# Patient Record
Sex: Female | Born: 1945
Health system: Southern US, Community
[De-identification: ages and names within clinical notes are randomized; demographics above are authoritative.]

## PROBLEM LIST (undated history)

## (undated) DIAGNOSIS — C4491 Basal cell carcinoma of skin, unspecified: Secondary | ICD-10-CM

## (undated) DIAGNOSIS — Z7902 Long term (current) use of antithrombotics/antiplatelets: Secondary | ICD-10-CM

## (undated) DIAGNOSIS — I779 Disorder of arteries and arterioles, unspecified: Secondary | ICD-10-CM

## (undated) DIAGNOSIS — K222 Esophageal obstruction: Secondary | ICD-10-CM

## (undated) DIAGNOSIS — F32A Depression, unspecified: Secondary | ICD-10-CM

## (undated) DIAGNOSIS — G4730 Sleep apnea, unspecified: Secondary | ICD-10-CM

## (undated) DIAGNOSIS — Z794 Long term (current) use of insulin: Secondary | ICD-10-CM

## (undated) DIAGNOSIS — E119 Type 2 diabetes mellitus without complications: Secondary | ICD-10-CM

## (undated) DIAGNOSIS — G4733 Obstructive sleep apnea (adult) (pediatric): Secondary | ICD-10-CM

## (undated) DIAGNOSIS — I639 Cerebral infarction, unspecified: Secondary | ICD-10-CM

## (undated) DIAGNOSIS — N183 Chronic kidney disease, stage 3 unspecified: Secondary | ICD-10-CM

## (undated) DIAGNOSIS — I10 Essential (primary) hypertension: Secondary | ICD-10-CM

## (undated) DIAGNOSIS — L439 Lichen planus, unspecified: Secondary | ICD-10-CM

## (undated) DIAGNOSIS — E039 Hypothyroidism, unspecified: Secondary | ICD-10-CM

## (undated) DIAGNOSIS — K219 Gastro-esophageal reflux disease without esophagitis: Secondary | ICD-10-CM

## (undated) DIAGNOSIS — H5316 Psychophysical visual disturbances: Secondary | ICD-10-CM

## (undated) DIAGNOSIS — B1910 Unspecified viral hepatitis B without hepatic coma: Secondary | ICD-10-CM

## (undated) DIAGNOSIS — L509 Urticaria, unspecified: Secondary | ICD-10-CM

## (undated) DIAGNOSIS — I447 Left bundle-branch block, unspecified: Secondary | ICD-10-CM

## (undated) DIAGNOSIS — N189 Chronic kidney disease, unspecified: Secondary | ICD-10-CM

## (undated) DIAGNOSIS — N2581 Secondary hyperparathyroidism of renal origin: Secondary | ICD-10-CM

## (undated) DIAGNOSIS — M1990 Unspecified osteoarthritis, unspecified site: Secondary | ICD-10-CM

## (undated) DIAGNOSIS — E785 Hyperlipidemia, unspecified: Secondary | ICD-10-CM

## (undated) DIAGNOSIS — C801 Malignant (primary) neoplasm, unspecified: Secondary | ICD-10-CM

## (undated) DIAGNOSIS — G2581 Restless legs syndrome: Secondary | ICD-10-CM

## (undated) DIAGNOSIS — K224 Dyskinesia of esophagus: Secondary | ICD-10-CM

## (undated) DIAGNOSIS — E079 Disorder of thyroid, unspecified: Secondary | ICD-10-CM

## (undated) DIAGNOSIS — I2510 Atherosclerotic heart disease of native coronary artery without angina pectoris: Secondary | ICD-10-CM

## (undated) DIAGNOSIS — N3946 Mixed incontinence: Secondary | ICD-10-CM

## (undated) DIAGNOSIS — I700 Atherosclerosis of aorta: Secondary | ICD-10-CM

## (undated) DIAGNOSIS — D649 Anemia, unspecified: Secondary | ICD-10-CM

## (undated) DIAGNOSIS — F329 Major depressive disorder, single episode, unspecified: Secondary | ICD-10-CM

## (undated) HISTORY — PX: TUBAL LIGATION: SHX77

## (undated) HISTORY — DX: Urticaria, unspecified: L50.9

## (undated) HISTORY — DX: Lichen planus, unspecified: L43.9

## (undated) HISTORY — DX: Atherosclerotic heart disease of native coronary artery without angina pectoris: I25.10

## (undated) HISTORY — DX: Psychophysical visual disturbances: H53.16

## (undated) HISTORY — DX: Type 2 diabetes mellitus without complications: E11.9

## (undated) HISTORY — PX: CATARACT EXTRACTION: SUR2

## (undated) HISTORY — DX: Sleep apnea, unspecified: G47.30

## (undated) HISTORY — PX: ESOPHAGOGASTRODUODENOSCOPY: SHX1529

## (undated) HISTORY — DX: Hypothyroidism, unspecified: E03.9

## (undated) HISTORY — DX: Mixed incontinence: N39.46

## (undated) HISTORY — DX: Unspecified viral hepatitis B without hepatic coma: B19.10

## (undated) HISTORY — DX: Essential (primary) hypertension: I10

## (undated) HISTORY — DX: Disorder of thyroid, unspecified: E07.9

## (undated) HISTORY — PX: TONSILLECTOMY: SUR1361

## (undated) HISTORY — PX: HEMORRHOID SURGERY: SHX153

## (undated) HISTORY — DX: Gastro-esophageal reflux disease without esophagitis: K21.9

## (undated) HISTORY — DX: Obstructive sleep apnea (adult) (pediatric): G47.33

## (undated) HISTORY — PX: IMPLANTABLE CONTACT LENS IMPLANTATION: SHX1792

---

## 2006-02-28 ENCOUNTER — Ambulatory Visit: Payer: Self-pay | Admitting: Internal Medicine

## 2006-04-03 ENCOUNTER — Ambulatory Visit: Payer: Self-pay | Admitting: Pain Medicine

## 2006-04-12 ENCOUNTER — Ambulatory Visit: Payer: Self-pay | Admitting: Pain Medicine

## 2006-04-13 ENCOUNTER — Ambulatory Visit: Payer: Self-pay | Admitting: Pain Medicine

## 2006-05-22 ENCOUNTER — Ambulatory Visit: Payer: Self-pay | Admitting: Pain Medicine

## 2006-12-19 ENCOUNTER — Ambulatory Visit: Payer: Self-pay | Admitting: Internal Medicine

## 2007-01-18 HISTORY — PX: COLONOSCOPY: SHX174

## 2007-02-22 ENCOUNTER — Ambulatory Visit: Payer: Self-pay | Admitting: Gastroenterology

## 2007-04-03 ENCOUNTER — Ambulatory Visit: Payer: Self-pay | Admitting: Internal Medicine

## 2007-04-16 ENCOUNTER — Ambulatory Visit: Payer: Self-pay | Admitting: Unknown Physician Specialty

## 2008-07-23 ENCOUNTER — Ambulatory Visit: Payer: Self-pay | Admitting: Cardiovascular Disease

## 2008-07-23 ENCOUNTER — Ambulatory Visit: Payer: Self-pay | Admitting: Ophthalmology

## 2008-07-31 ENCOUNTER — Ambulatory Visit: Payer: Self-pay | Admitting: Internal Medicine

## 2008-08-05 ENCOUNTER — Ambulatory Visit: Payer: Self-pay | Admitting: Ophthalmology

## 2008-11-11 ENCOUNTER — Ambulatory Visit: Payer: Self-pay | Admitting: Ophthalmology

## 2008-11-25 ENCOUNTER — Ambulatory Visit: Payer: Self-pay | Admitting: Ophthalmology

## 2009-08-05 ENCOUNTER — Ambulatory Visit: Payer: Self-pay | Admitting: Internal Medicine

## 2009-09-24 ENCOUNTER — Ambulatory Visit: Payer: Self-pay | Admitting: Gastroenterology

## 2009-10-22 ENCOUNTER — Ambulatory Visit: Payer: Self-pay | Admitting: Gastroenterology

## 2010-10-06 ENCOUNTER — Ambulatory Visit: Payer: Self-pay | Admitting: Internal Medicine

## 2011-05-13 ENCOUNTER — Ambulatory Visit: Payer: Self-pay | Admitting: Internal Medicine

## 2011-05-14 ENCOUNTER — Ambulatory Visit: Payer: Self-pay | Admitting: Internal Medicine

## 2011-05-14 LAB — COMPREHENSIVE METABOLIC PANEL
Albumin: 3.2 g/dL — ABNORMAL LOW (ref 3.4–5.0)
Anion Gap: 9 (ref 7–16)
Bilirubin,Total: 0.3 mg/dL (ref 0.2–1.0)
Calcium, Total: 9.2 mg/dL (ref 8.5–10.1)
Creatinine: 1.4 mg/dL — ABNORMAL HIGH (ref 0.60–1.30)
EGFR (Non-African Amer.): 39 — ABNORMAL LOW
Glucose: 295 mg/dL — ABNORMAL HIGH (ref 65–99)
Osmolality: 286 (ref 275–301)
Potassium: 3.8 mmol/L (ref 3.5–5.1)
SGOT(AST): 20 U/L (ref 15–37)
SGPT (ALT): 20 U/L
Sodium: 137 mmol/L (ref 136–145)
Total Protein: 7.1 g/dL (ref 6.4–8.2)

## 2011-05-14 LAB — CBC WITH DIFFERENTIAL/PLATELET
Basophil %: 0.2 %
Eosinophil #: 0.2 10*3/uL (ref 0.0–0.7)
Eosinophil %: 3.9 %
Lymphocyte %: 15.7 %
MCH: 30 pg (ref 26.0–34.0)
MCV: 91 fL (ref 80–100)
Monocyte #: 0.5 x10 3/mm (ref 0.2–0.9)
Monocyte %: 10.2 %
Neutrophil %: 70 %
Platelet: 159 10*3/uL (ref 150–440)
RDW: 13.9 % (ref 11.5–14.5)

## 2011-05-14 LAB — SEDIMENTATION RATE: Erythrocyte Sed Rate: 31 mm/hr — ABNORMAL HIGH (ref 0–30)

## 2011-05-15 ENCOUNTER — Ambulatory Visit: Payer: Self-pay | Admitting: Family Medicine

## 2011-05-15 LAB — CBC WITH DIFFERENTIAL/PLATELET
Basophil #: 0 10*3/uL (ref 0.0–0.1)
Basophil %: 0.2 %
Eosinophil #: 0.3 10*3/uL (ref 0.0–0.7)
Eosinophil %: 4.5 %
HCT: 36.9 % (ref 35.0–47.0)
HGB: 12.1 g/dL (ref 12.0–16.0)
Lymphocyte %: 9.7 %
MCH: 29.9 pg (ref 26.0–34.0)
MCHC: 32.8 g/dL (ref 32.0–36.0)
MCV: 91 fL (ref 80–100)
Monocyte %: 8.8 %
Neutrophil #: 4.3 10*3/uL (ref 1.4–6.5)
Platelet: 150 10*3/uL (ref 150–440)
RBC: 4.04 10*6/uL (ref 3.80–5.20)
RDW: 13.9 % (ref 11.5–14.5)

## 2011-05-16 ENCOUNTER — Inpatient Hospital Stay: Payer: Self-pay | Admitting: Internal Medicine

## 2011-05-16 ENCOUNTER — Ambulatory Visit: Payer: Self-pay

## 2011-05-16 LAB — BASIC METABOLIC PANEL
Anion Gap: 10 (ref 7–16)
BUN: 14 mg/dL (ref 7–18)
Calcium, Total: 9.1 mg/dL (ref 8.5–10.1)
Chloride: 106 mmol/L (ref 98–107)
Co2: 24 mmol/L (ref 21–32)
Creatinine: 1.48 mg/dL — ABNORMAL HIGH (ref 0.60–1.30)
EGFR (African American): 42 — ABNORMAL LOW
EGFR (Non-African Amer.): 37 — ABNORMAL LOW
Glucose: 190 mg/dL — ABNORMAL HIGH (ref 65–99)
Osmolality: 285 (ref 275–301)

## 2011-05-16 LAB — CBC WITH DIFFERENTIAL/PLATELET
Basophil #: 0 10*3/uL (ref 0.0–0.1)
Eosinophil #: 0.5 10*3/uL (ref 0.0–0.7)
HCT: 38.3 % (ref 35.0–47.0)
HGB: 12.3 g/dL (ref 12.0–16.0)
Lymphocyte %: 14.7 %
MCHC: 32.2 g/dL (ref 32.0–36.0)
MCV: 92 fL (ref 80–100)
Monocyte %: 10.3 %
Neutrophil #: 4.3 10*3/uL (ref 1.4–6.5)
Neutrophil %: 67.5 %
RDW: 14 % (ref 11.5–14.5)
WBC: 6.4 10*3/uL (ref 3.6–11.0)

## 2011-05-18 LAB — BASIC METABOLIC PANEL
Anion Gap: 5 — ABNORMAL LOW (ref 7–16)
BUN: 14 mg/dL (ref 7–18)
Chloride: 105 mmol/L (ref 98–107)
EGFR (African American): 55 — ABNORMAL LOW
EGFR (Non-African Amer.): 48 — ABNORMAL LOW
Glucose: 172 mg/dL — ABNORMAL HIGH (ref 65–99)
Osmolality: 280 (ref 275–301)
Potassium: 4.2 mmol/L (ref 3.5–5.1)
Sodium: 138 mmol/L (ref 136–145)

## 2011-05-18 LAB — CBC WITH DIFFERENTIAL/PLATELET
Basophil #: 0 10*3/uL (ref 0.0–0.1)
Basophil %: 0.4 %
Eosinophil %: 11.2 %
HGB: 11.4 g/dL — ABNORMAL LOW (ref 12.0–16.0)
Lymphocyte #: 1 10*3/uL (ref 1.0–3.6)
Lymphocyte %: 27.4 %
MCH: 30.4 pg (ref 26.0–34.0)
MCHC: 33.7 g/dL (ref 32.0–36.0)
MCV: 90 fL (ref 80–100)
Monocyte #: 0.7 x10 3/mm (ref 0.2–0.9)
Monocyte %: 17.6 %
Neutrophil %: 43.4 %
Platelet: 155 10*3/uL (ref 150–440)
RDW: 13.8 % (ref 11.5–14.5)

## 2011-05-19 LAB — BASIC METABOLIC PANEL
Anion Gap: 7 (ref 7–16)
BUN: 15 mg/dL (ref 7–18)
Co2: 28 mmol/L (ref 21–32)
EGFR (African American): 56 — ABNORMAL LOW
Glucose: 158 mg/dL — ABNORMAL HIGH (ref 65–99)
Osmolality: 284 (ref 275–301)
Potassium: 4.2 mmol/L (ref 3.5–5.1)

## 2011-05-20 LAB — CULTURE, BLOOD (SINGLE)

## 2011-05-22 LAB — CULTURE, BLOOD (SINGLE)

## 2011-12-12 ENCOUNTER — Ambulatory Visit: Payer: Self-pay | Admitting: Internal Medicine

## 2012-06-27 ENCOUNTER — Ambulatory Visit: Payer: Self-pay | Admitting: Internal Medicine

## 2012-06-28 ENCOUNTER — Ambulatory Visit: Payer: Self-pay | Admitting: Internal Medicine

## 2012-07-17 ENCOUNTER — Ambulatory Visit: Payer: Self-pay | Admitting: Internal Medicine

## 2013-01-22 DIAGNOSIS — R809 Proteinuria, unspecified: Secondary | ICD-10-CM | POA: Diagnosis not present

## 2013-01-22 DIAGNOSIS — IMO0001 Reserved for inherently not codable concepts without codable children: Secondary | ICD-10-CM | POA: Diagnosis not present

## 2013-01-22 DIAGNOSIS — E039 Hypothyroidism, unspecified: Secondary | ICD-10-CM | POA: Diagnosis not present

## 2013-01-25 DIAGNOSIS — IMO0001 Reserved for inherently not codable concepts without codable children: Secondary | ICD-10-CM | POA: Diagnosis not present

## 2013-02-11 DIAGNOSIS — E039 Hypothyroidism, unspecified: Secondary | ICD-10-CM | POA: Diagnosis not present

## 2013-02-11 DIAGNOSIS — E119 Type 2 diabetes mellitus without complications: Secondary | ICD-10-CM | POA: Diagnosis not present

## 2013-02-11 DIAGNOSIS — E7800 Pure hypercholesterolemia, unspecified: Secondary | ICD-10-CM | POA: Diagnosis not present

## 2013-02-19 DIAGNOSIS — E7800 Pure hypercholesterolemia, unspecified: Secondary | ICD-10-CM | POA: Diagnosis not present

## 2013-02-19 DIAGNOSIS — I10 Essential (primary) hypertension: Secondary | ICD-10-CM | POA: Diagnosis not present

## 2013-02-19 DIAGNOSIS — E119 Type 2 diabetes mellitus without complications: Secondary | ICD-10-CM | POA: Diagnosis not present

## 2013-03-21 ENCOUNTER — Ambulatory Visit: Payer: Self-pay | Admitting: Internal Medicine

## 2013-03-21 DIAGNOSIS — Z1231 Encounter for screening mammogram for malignant neoplasm of breast: Secondary | ICD-10-CM | POA: Diagnosis not present

## 2013-03-26 DIAGNOSIS — Z85828 Personal history of other malignant neoplasm of skin: Secondary | ICD-10-CM | POA: Diagnosis not present

## 2013-03-26 DIAGNOSIS — R21 Rash and other nonspecific skin eruption: Secondary | ICD-10-CM | POA: Diagnosis not present

## 2013-03-26 DIAGNOSIS — L439 Lichen planus, unspecified: Secondary | ICD-10-CM | POA: Diagnosis not present

## 2013-03-26 DIAGNOSIS — L821 Other seborrheic keratosis: Secondary | ICD-10-CM | POA: Diagnosis not present

## 2013-04-26 DIAGNOSIS — IMO0001 Reserved for inherently not codable concepts without codable children: Secondary | ICD-10-CM | POA: Diagnosis not present

## 2013-04-26 DIAGNOSIS — R809 Proteinuria, unspecified: Secondary | ICD-10-CM | POA: Diagnosis not present

## 2013-04-26 DIAGNOSIS — E039 Hypothyroidism, unspecified: Secondary | ICD-10-CM | POA: Diagnosis not present

## 2013-05-02 DIAGNOSIS — E039 Hypothyroidism, unspecified: Secondary | ICD-10-CM | POA: Diagnosis not present

## 2013-05-02 DIAGNOSIS — IMO0001 Reserved for inherently not codable concepts without codable children: Secondary | ICD-10-CM | POA: Diagnosis not present

## 2013-05-03 DIAGNOSIS — F331 Major depressive disorder, recurrent, moderate: Secondary | ICD-10-CM | POA: Diagnosis not present

## 2013-06-16 DIAGNOSIS — E785 Hyperlipidemia, unspecified: Secondary | ICD-10-CM | POA: Insufficient documentation

## 2013-06-16 DIAGNOSIS — G4733 Obstructive sleep apnea (adult) (pediatric): Secondary | ICD-10-CM

## 2013-06-16 DIAGNOSIS — E039 Hypothyroidism, unspecified: Secondary | ICD-10-CM

## 2013-06-16 DIAGNOSIS — E119 Type 2 diabetes mellitus without complications: Secondary | ICD-10-CM | POA: Insufficient documentation

## 2013-06-16 DIAGNOSIS — Z9989 Dependence on other enabling machines and devices: Secondary | ICD-10-CM

## 2013-06-16 DIAGNOSIS — I10 Essential (primary) hypertension: Secondary | ICD-10-CM

## 2013-06-16 DIAGNOSIS — K219 Gastro-esophageal reflux disease without esophagitis: Secondary | ICD-10-CM

## 2013-06-16 HISTORY — DX: Obstructive sleep apnea (adult) (pediatric): G47.33

## 2013-06-16 HISTORY — DX: Hypothyroidism, unspecified: E03.9

## 2013-06-16 HISTORY — DX: Essential (primary) hypertension: I10

## 2013-06-16 HISTORY — DX: Gastro-esophageal reflux disease without esophagitis: K21.9

## 2013-06-16 HISTORY — DX: Dependence on other enabling machines and devices: Z99.89

## 2013-06-20 DIAGNOSIS — E119 Type 2 diabetes mellitus without complications: Secondary | ICD-10-CM | POA: Diagnosis not present

## 2013-06-20 DIAGNOSIS — E7800 Pure hypercholesterolemia, unspecified: Secondary | ICD-10-CM | POA: Diagnosis not present

## 2013-06-27 DIAGNOSIS — I10 Essential (primary) hypertension: Secondary | ICD-10-CM | POA: Diagnosis not present

## 2013-06-27 DIAGNOSIS — R079 Chest pain, unspecified: Secondary | ICD-10-CM | POA: Diagnosis not present

## 2013-06-27 DIAGNOSIS — E1169 Type 2 diabetes mellitus with other specified complication: Secondary | ICD-10-CM | POA: Diagnosis not present

## 2013-06-27 DIAGNOSIS — E1149 Type 2 diabetes mellitus with other diabetic neurological complication: Secondary | ICD-10-CM | POA: Diagnosis not present

## 2013-06-27 DIAGNOSIS — E039 Hypothyroidism, unspecified: Secondary | ICD-10-CM | POA: Diagnosis not present

## 2013-07-11 DIAGNOSIS — R079 Chest pain, unspecified: Secondary | ICD-10-CM | POA: Diagnosis not present

## 2013-07-25 DIAGNOSIS — IMO0001 Reserved for inherently not codable concepts without codable children: Secondary | ICD-10-CM | POA: Diagnosis not present

## 2013-07-26 DIAGNOSIS — I10 Essential (primary) hypertension: Secondary | ICD-10-CM | POA: Diagnosis not present

## 2013-07-26 DIAGNOSIS — R0602 Shortness of breath: Secondary | ICD-10-CM | POA: Diagnosis not present

## 2013-07-26 DIAGNOSIS — E1149 Type 2 diabetes mellitus with other diabetic neurological complication: Secondary | ICD-10-CM | POA: Diagnosis not present

## 2013-07-26 DIAGNOSIS — Z0000 Encounter for general adult medical examination without abnormal findings: Secondary | ICD-10-CM | POA: Diagnosis not present

## 2013-07-29 DIAGNOSIS — F331 Major depressive disorder, recurrent, moderate: Secondary | ICD-10-CM | POA: Diagnosis not present

## 2013-08-02 DIAGNOSIS — R0602 Shortness of breath: Secondary | ICD-10-CM | POA: Diagnosis not present

## 2013-08-02 DIAGNOSIS — R0609 Other forms of dyspnea: Secondary | ICD-10-CM | POA: Diagnosis not present

## 2013-08-02 DIAGNOSIS — E669 Obesity, unspecified: Secondary | ICD-10-CM | POA: Diagnosis not present

## 2013-08-02 DIAGNOSIS — G4733 Obstructive sleep apnea (adult) (pediatric): Secondary | ICD-10-CM | POA: Diagnosis not present

## 2013-08-06 DIAGNOSIS — R809 Proteinuria, unspecified: Secondary | ICD-10-CM | POA: Insufficient documentation

## 2013-08-08 DIAGNOSIS — E039 Hypothyroidism, unspecified: Secondary | ICD-10-CM | POA: Diagnosis not present

## 2013-08-08 DIAGNOSIS — R809 Proteinuria, unspecified: Secondary | ICD-10-CM | POA: Diagnosis not present

## 2013-08-08 DIAGNOSIS — E1149 Type 2 diabetes mellitus with other diabetic neurological complication: Secondary | ICD-10-CM | POA: Diagnosis not present

## 2013-08-26 DIAGNOSIS — L439 Lichen planus, unspecified: Secondary | ICD-10-CM | POA: Diagnosis not present

## 2013-08-26 DIAGNOSIS — R209 Unspecified disturbances of skin sensation: Secondary | ICD-10-CM | POA: Diagnosis not present

## 2013-10-10 DIAGNOSIS — D239 Other benign neoplasm of skin, unspecified: Secondary | ICD-10-CM | POA: Diagnosis not present

## 2013-10-10 DIAGNOSIS — L820 Inflamed seborrheic keratosis: Secondary | ICD-10-CM | POA: Diagnosis not present

## 2013-10-10 DIAGNOSIS — L578 Other skin changes due to chronic exposure to nonionizing radiation: Secondary | ICD-10-CM | POA: Diagnosis not present

## 2013-10-10 DIAGNOSIS — B359 Dermatophytosis, unspecified: Secondary | ICD-10-CM | POA: Diagnosis not present

## 2013-10-10 DIAGNOSIS — Z1283 Encounter for screening for malignant neoplasm of skin: Secondary | ICD-10-CM | POA: Diagnosis not present

## 2013-10-10 DIAGNOSIS — Z85828 Personal history of other malignant neoplasm of skin: Secondary | ICD-10-CM | POA: Diagnosis not present

## 2013-10-10 DIAGNOSIS — L608 Other nail disorders: Secondary | ICD-10-CM | POA: Diagnosis not present

## 2013-10-28 DIAGNOSIS — F331 Major depressive disorder, recurrent, moderate: Secondary | ICD-10-CM | POA: Diagnosis not present

## 2013-10-29 DIAGNOSIS — R0609 Other forms of dyspnea: Secondary | ICD-10-CM | POA: Diagnosis not present

## 2013-10-29 DIAGNOSIS — G4733 Obstructive sleep apnea (adult) (pediatric): Secondary | ICD-10-CM | POA: Diagnosis not present

## 2013-11-06 DIAGNOSIS — M950 Acquired deformity of nose: Secondary | ICD-10-CM | POA: Diagnosis not present

## 2013-11-06 DIAGNOSIS — J301 Allergic rhinitis due to pollen: Secondary | ICD-10-CM | POA: Diagnosis not present

## 2013-11-06 DIAGNOSIS — R0981 Nasal congestion: Secondary | ICD-10-CM | POA: Diagnosis not present

## 2013-11-06 DIAGNOSIS — R0600 Dyspnea, unspecified: Secondary | ICD-10-CM | POA: Diagnosis not present

## 2013-11-08 DIAGNOSIS — R809 Proteinuria, unspecified: Secondary | ICD-10-CM | POA: Diagnosis not present

## 2013-11-08 DIAGNOSIS — E039 Hypothyroidism, unspecified: Secondary | ICD-10-CM | POA: Diagnosis not present

## 2013-11-08 DIAGNOSIS — E1149 Type 2 diabetes mellitus with other diabetic neurological complication: Secondary | ICD-10-CM | POA: Diagnosis not present

## 2013-11-11 DIAGNOSIS — E1169 Type 2 diabetes mellitus with other specified complication: Secondary | ICD-10-CM | POA: Diagnosis not present

## 2013-11-11 DIAGNOSIS — Z794 Long term (current) use of insulin: Secondary | ICD-10-CM | POA: Diagnosis not present

## 2013-11-11 DIAGNOSIS — E039 Hypothyroidism, unspecified: Secondary | ICD-10-CM | POA: Diagnosis not present

## 2013-11-11 DIAGNOSIS — R809 Proteinuria, unspecified: Secondary | ICD-10-CM | POA: Diagnosis not present

## 2013-11-20 DIAGNOSIS — E780 Pure hypercholesterolemia: Secondary | ICD-10-CM | POA: Diagnosis not present

## 2013-11-20 DIAGNOSIS — J301 Allergic rhinitis due to pollen: Secondary | ICD-10-CM | POA: Diagnosis not present

## 2013-11-20 DIAGNOSIS — Z79899 Other long term (current) drug therapy: Secondary | ICD-10-CM | POA: Diagnosis not present

## 2013-11-26 DIAGNOSIS — Z23 Encounter for immunization: Secondary | ICD-10-CM | POA: Diagnosis not present

## 2013-11-27 DIAGNOSIS — E039 Hypothyroidism, unspecified: Secondary | ICD-10-CM | POA: Diagnosis not present

## 2013-11-27 DIAGNOSIS — I10 Essential (primary) hypertension: Secondary | ICD-10-CM | POA: Diagnosis not present

## 2013-11-27 DIAGNOSIS — E1122 Type 2 diabetes mellitus with diabetic chronic kidney disease: Secondary | ICD-10-CM | POA: Diagnosis not present

## 2013-11-27 DIAGNOSIS — F325 Major depressive disorder, single episode, in full remission: Secondary | ICD-10-CM | POA: Insufficient documentation

## 2014-01-20 DIAGNOSIS — F331 Major depressive disorder, recurrent, moderate: Secondary | ICD-10-CM | POA: Diagnosis not present

## 2014-01-27 DIAGNOSIS — L438 Other lichen planus: Secondary | ICD-10-CM | POA: Diagnosis not present

## 2014-01-27 DIAGNOSIS — D1800 Hemangioma unspecified site: Secondary | ICD-10-CM | POA: Diagnosis not present

## 2014-01-27 DIAGNOSIS — L820 Inflamed seborrheic keratosis: Secondary | ICD-10-CM | POA: Diagnosis not present

## 2014-01-27 DIAGNOSIS — L578 Other skin changes due to chronic exposure to nonionizing radiation: Secondary | ICD-10-CM | POA: Diagnosis not present

## 2014-01-27 DIAGNOSIS — Z85828 Personal history of other malignant neoplasm of skin: Secondary | ICD-10-CM | POA: Diagnosis not present

## 2014-01-27 DIAGNOSIS — R21 Rash and other nonspecific skin eruption: Secondary | ICD-10-CM | POA: Diagnosis not present

## 2014-01-27 DIAGNOSIS — L814 Other melanin hyperpigmentation: Secondary | ICD-10-CM | POA: Diagnosis not present

## 2014-01-27 DIAGNOSIS — L821 Other seborrheic keratosis: Secondary | ICD-10-CM | POA: Diagnosis not present

## 2014-02-06 DIAGNOSIS — E1169 Type 2 diabetes mellitus with other specified complication: Secondary | ICD-10-CM | POA: Diagnosis not present

## 2014-02-13 DIAGNOSIS — E039 Hypothyroidism, unspecified: Secondary | ICD-10-CM | POA: Diagnosis not present

## 2014-02-13 DIAGNOSIS — E1169 Type 2 diabetes mellitus with other specified complication: Secondary | ICD-10-CM | POA: Diagnosis not present

## 2014-02-13 DIAGNOSIS — Z794 Long term (current) use of insulin: Secondary | ICD-10-CM | POA: Diagnosis not present

## 2014-02-13 DIAGNOSIS — R809 Proteinuria, unspecified: Secondary | ICD-10-CM | POA: Diagnosis not present

## 2014-02-21 DIAGNOSIS — M180 Bilateral primary osteoarthritis of first carpometacarpal joints: Secondary | ICD-10-CM | POA: Diagnosis not present

## 2014-03-10 DIAGNOSIS — E785 Hyperlipidemia, unspecified: Secondary | ICD-10-CM | POA: Diagnosis not present

## 2014-03-10 DIAGNOSIS — N184 Chronic kidney disease, stage 4 (severe): Secondary | ICD-10-CM | POA: Diagnosis not present

## 2014-03-10 DIAGNOSIS — D631 Anemia in chronic kidney disease: Secondary | ICD-10-CM | POA: Diagnosis not present

## 2014-03-10 DIAGNOSIS — E1122 Type 2 diabetes mellitus with diabetic chronic kidney disease: Secondary | ICD-10-CM | POA: Diagnosis not present

## 2014-03-10 DIAGNOSIS — I10 Essential (primary) hypertension: Secondary | ICD-10-CM | POA: Diagnosis not present

## 2014-03-10 DIAGNOSIS — R809 Proteinuria, unspecified: Secondary | ICD-10-CM | POA: Diagnosis not present

## 2014-03-10 DIAGNOSIS — N2581 Secondary hyperparathyroidism of renal origin: Secondary | ICD-10-CM | POA: Diagnosis not present

## 2014-03-13 DIAGNOSIS — R809 Proteinuria, unspecified: Secondary | ICD-10-CM | POA: Diagnosis not present

## 2014-04-09 DIAGNOSIS — I10 Essential (primary) hypertension: Secondary | ICD-10-CM | POA: Diagnosis not present

## 2014-04-09 DIAGNOSIS — N2581 Secondary hyperparathyroidism of renal origin: Secondary | ICD-10-CM | POA: Diagnosis not present

## 2014-04-09 DIAGNOSIS — R809 Proteinuria, unspecified: Secondary | ICD-10-CM | POA: Diagnosis not present

## 2014-04-09 DIAGNOSIS — E1122 Type 2 diabetes mellitus with diabetic chronic kidney disease: Secondary | ICD-10-CM | POA: Diagnosis not present

## 2014-04-09 DIAGNOSIS — N183 Chronic kidney disease, stage 3 (moderate): Secondary | ICD-10-CM | POA: Diagnosis not present

## 2014-05-11 NOTE — Consult Note (Signed)
Impression: 89GH WF w/ h/o DM admitted with infected cat bite.  It is not uncommon for cat bites to become infected.  The main organism of concern is Pasteurella multocida.  While other organisms exist in cat's mouths, this one causes the most morbidity.  Staph aureus is relatively uncommon.  Cat bites can often inoculate joints or tendon sheaths or cause deep abscesses due to the deep penetration of the bite. She does not appear to have joint involvement, but has some tenderness along the extensor tendon of the first finger. Will change her antibiotics to unasyn.  Neither vanco nor clinda has any activity against Pasteurella, while unasyn will cover anaerobes and Strep spp that would likely be in a cat's mouth as well as Pasteurella. Will follow her clinically.  If she worsens, would get an MRI of her hand to look for undrained pus. Await BCx results.  Electronic Signatures: Thomasina Housley, Heinz Knuckles (MD)  (Signed on 30-Apr-13 16:57)  Authored  Last Updated: 30-Apr-13 16:57 by Constantina Laseter, Heinz Knuckles (MD)

## 2014-05-11 NOTE — Consult Note (Signed)
PATIENT NAME:  Abigail, Walker MR#:  [id number] DATE OF BIRTH:  April 02, 1945  DATE OF CONSULTATION:  05/17/2011  REFERRING PHYSICIAN:  Frazier Richards, MD  CONSULTING PHYSICIAN:  Abigail Knuckles. Ayyan Sites, MD  REASON FOR CONSULTATION:  Infected cat bite.  HISTORY OF PRESENT ILLNESS: The patient is a 69 year old white female with a past history significant for diabetes who was bitten by her cat approximately four days prior to admission. She states that her cat had a urinary tract infection. She was giving it antibiotics at approximately 2 in the morning when the cat turned its head and bit her on the left hand.  The top teeth punctured around the second carpometacarpal joint and the underside was in the web space  between her first and second fingers.  The cat was up-to-date with its rabies vaccination and had not been acting differently than usual. The patient states that within 10 hours there was redness and swelling over the carpometacarpal joint of the second digit. She was seen in Urgent Care and given Augmentin and trimethoprim sulfamethoxazole. Over the next 24 hours she developed an erythematous pruritic rash throughout her body. She stopped the sulfa drug and returned to Urgent Care and was given doxycycline. She had been taking the Augmentin for approximately four days and was seen back by Urgent Care and was then referred to the Emergency Room. From the Emergency Room she was given vancomycin and clindamycin. Blood cultures were obtained and are negative to date. She denies any fevers, chills, or sweats. She has not had any nausea or vomiting. The swelling in her hand has improved slightly overnight. She has not had any drainage from the wounds.   ALLERGIES: Novocain and sulfa drugs.   PAST MEDICAL HISTORY:  1. Diabetes.  2. Hypercholesterolemia.  3. Degenerative disk disease.  4. Gastroesophageal reflux disease.  5. Sleep apnea.  6. Hypothyroidism.  7. Depression.  8. Hepatitis B infection  which she states has resolved.   SOCIAL HISTORY: The patient lives by herself. She does not smoke. She does not drink. She has a cat at home.   FAMILY HISTORY: Positive for coronary artery disease.   REVIEW OF SYSTEMS: GENERAL: No fevers, chills, or sweats. HEENT: No headache. No sinus congestion. No sore throat. NECK: No stiffness or swollen glands. RESPIRATORY: No cough. No shortness of breath. No sputum production. CARDIAC: No chest pains or palpitations. GI: No nausea, no vomiting. No change in her bowels. GU: No change in her urine. MUSCULOSKELETAL: She has no joint or muscle complaints other than in her left hand. Her left hand is swollen in the second digit with some redness. She has fairly good range of motion.   SKIN: She has patches of erythematous pruritic lesions that have been present since she started the sulfa drug. She also has erythema around the second digit on the left hand. She has puncture wounds related to the cat bite as well.   NEUROLOGIC: No focal weakness.   PSYCHIATRIC: No complaints. All other systems are negative.   PHYSICAL EXAMINATION:  VITAL SIGNS: T-max of 101, T-current 98.8, pulse 87, blood pressure 111/66, 99% on room air.   GENERAL: 70 year old white female in no acute distress.   HEENT: Normocephalic, atraumatic.   LUNGS: Clear to auscultation bilaterally with good air movement. No focal consolidation.   HEART: Regular rate and rhythm without murmur, rub, or gallop.   ABDOMEN: Soft, nontender, and nondistended. No hepatosplenomegaly. No hernia is noted.   EXTREMITIES: No evidence for peripheral  edema. Her left hand, however, had swelling around the carpometacarpal joint of the second digit. There was some erythema surrounding this. She had good range of motion of the first digit and had good strength. She had some tenderness along the dorsal aspect of the tendon of the first digit extending along the back of the hand. There were two puncture wounds  without significant purulence or erythema present. There is no evidence for lymphangitic streaking.   SKIN: In addition to the findings as noted above, she had patches of erythema that were mildly blanching and somewhat poorly demarcated on her wrists, palms, and extending onto her upper extremities and back, consistent with a drug reaction.   NEUROLOGIC: The patient was awake and interactive, moving all four extremities.   PSYCHIATRIC: Mood and affect appeared normal.   LABORATORY DATA: BUN 14, creatinine 1.48, white count of 6.4, hemoglobin of 12.3, platelet count 165. ANC 4.3. Blood cultures from 04/27 and 04/29 show no growth. Plain films of the left hand showed no obvious bony abnormality.   IMPRESSION: 69 year old white female with a history of diabetes admitted with infected cat bite.   RECOMMENDATIONS:  1. It is not uncommon for cat bites to become infected. The main organism of concern is Pasteurella multocida.  While other organisms exist in the cat's mouth, this one causes the most morbidity.  Staph aureus is relatively uncommonly seen in these types of infections. Cat bites can often inoculate joints or tendon sheaths or cause deep abscesses due to the deep penetration of the bite.  2. She does not appear to have joint involvement but has some tenderness along the extensor tendon of the second finger.  3. We will change her antibiotics to Unasyn. Neither vancomycin nor clindamycin has any activity against Pasteurella while Unasyn will cover anaerobes and strep species that would likely be in a cat's mouth as well as Pasteurella. We will follow her clinically. If she worsens we will plan on getting an MRI of her hand to look for undrained pus.  4. We will await the blood culture results.  This is a low level infectious disease consult. Thank you very much for involving me in Abigail Walker's care. ____________________________ Abigail Knuckles. Mariadelaluz Guggenheim, MD meb:bjt D:  05/17/2011 17:05:45 ET           T: 05/18/2011 09:18:54 ET         JOB#: [id number]  Nasean Zapf E Seanne Chirico MD ELECTRONICALLY SIGNED 05/18/2011 14:35

## 2014-05-11 NOTE — Discharge Summary (Signed)
PATIENT NAME:  Abigail Walker, Abigail Walker MR#:  [id number] DATE OF BIRTH:  05/19/1945  DATE OF ADMISSION:  05/16/2011 DATE OF DISCHARGE:  05/19/2011  DISCHARGE DIAGNOSES:  1. Cellulitis secondary to cat bite.  2. Type 2 diabetes.  3. Failure of outpatient antibiotics.  4. Hypothyroidism.  5. Hypertension.   DISCHARGE MEDICATIONS:  1. Augmentin 875 mg p.o. b.i.d. for 10 days.  Otherwise she will take her usual outpatient medications which according to prescription writer are: 2. Torsemide 10 mg daily.  3. Thyroid 75 mcg daily.  4. Paxil 20 mg daily.  5. Metformin 1000 mg b.i.d.  6. Januvia 100 mg daily. 7. Bupropion XL 450 mg daily. 8. Pravastatin 40 mg daily.  9. Quinapril 20 mg daily.  10. Aspirin 81 mg daily.  11. Acetaminophen p.r.n.   HISTORY AND PHYSICAL: Please see detailed History and Physical done on admission.   HOSPITAL COURSE: The patient was admitted with swollen first left digit at the MCP joint and surrounding it, spreading up the hand somewhat. She was started on IV antibiotics. Infectious disease was consulted and she was changed to Unasyn for cat bite, worried about pasteurella which is a typical causative agent there. She improved rapidly markedly with that. No longer had swelling, erythema, etc. Minimal right around the wound at this point. It was felt by me and Dr. Clayborn Bigness that she was stable and ready to go home. She was seen by orthopedics and not felt to have intraarticular infection either. Met-B was stable today. Glucose has been slightly high off of  metformin while in the hospital with the infection. White blood cell count was 3700 yesterday. Stable numbers otherwise on the CBC. CRP was high, likely from her known infection, that was ordered on admission. Blood cultures were normal. She was having some diarrhea, four to five mushy stools a day per her description.  C. difficile was sent, not back yet. She will take Imodium p.r.n. unless this comes back positive.      ____________________________ Ocie Cornfield. Ouida Sills, MD mwa:bjt D: 05/19/2011 07:56:50 ET T: 05/20/2011 09:40:52 ET JOB#: [id number]  cc: Ocie Cornfield. Ouida Sills, MD, <Dictator> Kirk Ruths MD ELECTRONICALLY SIGNED 05/22/2011 19:53

## 2014-05-11 NOTE — H&P (Signed)
PATIENT NAME:  Abigail Walker, Abigail Walker MR#:  [id number] DATE OF BIRTH:  05-16-1945  DATE OF ADMISSION:  05/16/2011  ADDENDUM:   HOME MEDICATIONS:   The patient's medications are updated. The patient takes:  1. Bupropion 450 mg daily.  2. Januvia 100 mg daily. 3. Levothyroxine 75 mcg p.o. daily.  4. Metformin 1 gram p.o. b.i.d.  5. Paxil 20 mg p.o. daily.  6. Pravastatin 40 mg daily. 7. Quinapril 20 mg daily. 8. Torsemide 10 mg once a day.   NOTE: She is also on Augmentin and doxycycline that was given by the Urgent Care physician. So, the patient can continue her home medications. She does not want to get medicines from the Pharmacy, but she can continue her medications. We have added  vancomycin and clindamycin for her antibiotics, and she can stop taking doxycycline and Augmentin.  We will also do sliding scale coverage.   ____________________________ Epifanio Lesches, MD sk:cbb D: 05/16/2011 17:00:09 ET T: 05/16/2011 17:16:42 ET JOB#: [id number]  cc: Epifanio Lesches, MD, <Dictator> Ocie Cornfield. Ouida Sills, MD Epifanio Lesches MD ELECTRONICALLY SIGNED 05/17/2011 22:30

## 2014-05-11 NOTE — Consult Note (Signed)
Brief Consult Note: Diagnosis: Left hand cat bite.   Patient was seen by consultant.   Comments: Patient was seen and evaluated.  Patient was bitten by her cat on Friday morning while trying to give the cat medication for a UTI.  The bite is over the distal index finger metacarpal, near the MCP joint.  The patient initially went to the [facility] where she was initially given bactrim and augmentin.  She developed a rash and was changed to doxicycline and augmentin.  She returned daily to the [facility]gent care for checks and was sent for admission to [facility] for IV antibiotics yesterday when she was failing to resolve the erythema on oral antibiotics.  Today the patient states that the erythema is improving.  She has some limitiation of motion at the MCP joint but minimally pain.  She denies fevers or chills.  Her PMHx was reviewed from the admission H&P.  On exam the patient has 2 puncture wounds over the distal left index metacarpal, one dorsal and one volar.  These are not draining.  There is mild erythema around the dorsal puncture wound but no erythema volarly.  The range of motion of the MCP joint is mildly limited due to swelling but ROM of the joint is not painful.  There is no streaking erythema uop the forearm.  Fingers are well perfused.  Sensation is intact in the left index finger.  Radiographs of the left index finger from 05-16-11 were reviewed.  These show no fracture or dislocation.  No foreign bodies are seen.  The is no evidence of osteomyelitis or other degenerative joint disease.  I do not feel the patient requires any surgical intervention at this time.  Continue to treat with antibiotics as the patient has noted improvement in the swelling and erythema already.  I do not believe she has a septic MCP joint at this time.  Consult Dr. Clayborn Bigness for antibiotic recommendations.  Will follow.  Electronic Signatures for Addendum Section:  Thornton Park (MD) (Signed Addendum 30-Apr-13  11:07)  Patient does not need to splint the left index finger.  She should work on ROM to avoid stiffness.   Electronic Signatures: Thornton Park (MD)  (Signed 30-Apr-13 11:06)  Authored: Brief Consult Note   Last Updated: 30-Apr-13 11:07 by Thornton Park (MD)

## 2014-05-11 NOTE — H&P (Signed)
PATIENT NAME:  Abigail Walker, Abigail Walker MR#:  [id number] DATE OF BIRTH:  1945-07-08  DATE OF ADMISSION:  05/16/2011  PRIMARY CARE PHYSICIAN: Dr. Frazier Richards   The patient is referred from [facility].   CHIEF COMPLAINT:  Infection in the hand after cat bite.  HISTORY OF PRESENT ILLNESS: 69 year old female with history of hypertension, diabetes, depression, had a cat bite on Friday early morning around 2:30 when she was trying to give medicine the cat bit her.  The patient went to [facility] Friday morning and was given Augmentin and Bactrim.  The patient did not have any fever but she had redness and swelling over the left hand, index finger all the way up to the wrist and then spreading to the left upper extremity mainly around the forearm site. After she was given Augmentin and Bactrim she was  advised to follow up every day so that can they can follow up on resolution.  She went again on Saturday and Sunday. She started to take Bactrim and developed a rash, generalized, so on Sunday Bactrim was stopped and she was started on doxycycline. She is getting Augmentin and doxycycline and today she went for followup at [facility] and Dr. Posey Pronto called me and said the patient's infection is not improving and asked [place] for admission for IV antibiotics. The patient mainly has pain and redness around the bottom of the index finger with tenderness to palpation. Denies any fever.  Dr. Sabra Heck was contacted by the Urgent Care physician, Dr. Earnestine Leys, orthopedic physician, has been contacted and advised to admit to the medical service and he will consult for possible joint infection.   PAST MEDICAL HISTORY:  1. Hyperlipidemia.  2. Gastroesophageal reflux disease.  3. Degenerative disk disease.  4. Obstructive sleep apnea.  5. Hypothyroidism.  6. Depression. 7. Lichen planus mainly in the oral and genital areas.  8. History of hepatitis B.  SOCIAL HISTORY: No smoking. No drinking. Lives  alone.   PAST SURGICAL HISTORY:  1. Hemorrhoidectomy.  2. Tonsillectomy. 3. Tubal ligation.   ALLERGIES: Right now she said she is allergic to sulfa and novocaine.   HOME MEDICATIONS:  1. Torsemide 10 mg p.o. daily.  2. Paxil 20 mg daily.  3. Levothyroxine 75 mcg p.o. daily.  4. Tacrolimus spray, swish and spit, for her lichen planus as needed. 5. Clobetasol for genital lichen planus as needed. 6. Aspirin 162 mg daily.  7. Januvia.  8. Lantus.  9. Metformin. 10. Torsemide. 11. Quinapril. 12. Pravastatin. We will update this once we get it from the patient.   FAMILY HISTORY: Significant that father had heart disease.   REVIEW OF SYSTEMS:  CONSTITUTIONAL: Feels very weak. EYES: No blurred vision.  ENT: No tinnitus. No epistaxis. No difficulty swallowing. The patient has lichen planus, uses tacrolimus. RESPIRATORY: Has no cough. CARDIOVASCULAR: No chest pain. No palpitations. GASTROINTESTINAL: No nausea. No vomiting. No abdominal pain. GENITOURINARY: No dysuria. ENDOCRINE: No polyuria or nocturia. No heat or cold intolerance. INTEGUMENT: Redness and sweating around the left index finger. MUSCULOSKELETAL: Denies any joint pain. NEUROLOGIC: No numbness or weakness. PSYCH: No anxiety. Has history of depression.   PHYSICAL EXAMINATION:  VITAL SIGNS: Temperature 98.5, pulse 100, respirations 18, blood pressure 160/66, sats 99%  on room air. Blood sugar 309.   GENERAL: She is an alert, awake, oriented female, not in distress.   HEENT: Head atraumatic, normocephalic. Pupils equal, reacting to light. Extraocular movements are intact. ENT: No tympanic membrane congestion.  No turbinate hypertrophy. No oropharyngeal erythema.   NECK: Normal range of motion. No JVD. No carotid bruit.   RESPIRATORY: Clear to auscultation. No wheeze. No rales.   CARDIOVASCULAR: S1 and S2 regular. No murmurs.   ABDOMEN: Soft, obese. Bowel sounds present.   MUSCULOSKELETAL: Strength 5/5 upper and lower  extremities.   SKIN: The patient has  redness around the left hand/wrist area, mainly around the proximal interphalangeal joint of the left index finger and there is tenderness to palpation. There is a black spot where she had been bitten and redness all the way up to the wrist. She also has redness all over her body, says that she is allergic to Bactrim and it is getting better.   LYMPH NODES: No lymphadenopathy.   NEUROLOGICAL: Cranial nerves II through XII grossly intact.   PSYCHIATRIC: Oriented to time, place, person.   LABORATORY, DIAGNOSTIC, AND RADIOLOGICAL DATA:  On 04/27 WBC was 4.9, hemoglobin 12.6, hematocrit 38.3, platelets 159, neutrophils were 70. Electrolytes on 04/27 showed sodium 137, potassium 3.8, chloride 102, bicarbonate 26, BUN 15, creatinine 1.4, glucose 295. Liver functions within normal limits. GFR 39. The patient's blood cultures done 04/27 did not show any growth. Labs repeated on the 28th showed white count has been normal.   ASSESSMENT AND PLAN:  1. This is a 69 year old female who has mainly the infection in the left hand, which is refractory to p.o. antibiotics. The patient still has tenderness and redness around the index finger. There is a concern for joint infection so I was asked to admit. We are going to admit her and give IV vancomycin and clindamycin and repeat blood cultures. Repeat stat labs. Orthopedic consult with Dr. Earnestine Leys. The patient will be monitored for clinical response.  2. Likely chronic kidney disease. The patient is unaware of any problems. She has probably diabetic nephropathy. Monitor kidney function closely and avoid nephrotoxic agents and renal dose all medications. 3. History of diabetes mellitus. The patient wants to continue her own Januvia and Lantus and metformin. 4. Hypertension. Blood pressure is slightly elevated but she does not have any symptoms so continue her home medications.  5. Lichen planus. She has tacrolimus spray that  she can use.  6. Depression. She is on Paxil. Continue Paxil.  She is going to be followed by [facility] physician Dr. Frazier Richards starting tomorrow.   TIME SPENT: 60 minutes.   ____________________________ Epifanio Lesches, MD sk:bjt D: 05/16/2011 16:51:47 ET T: 05/16/2011 17:20:05 ET JOB#: [id number]  cc: Epifanio Lesches, MD, <Dictator> Ocie Cornfield. Ouida Sills, MD Epifanio Lesches MD ELECTRONICALLY SIGNED 05/17/2011 22:30

## 2014-05-15 DIAGNOSIS — E039 Hypothyroidism, unspecified: Secondary | ICD-10-CM | POA: Diagnosis not present

## 2014-05-15 DIAGNOSIS — E1169 Type 2 diabetes mellitus with other specified complication: Secondary | ICD-10-CM | POA: Diagnosis not present

## 2014-05-21 DIAGNOSIS — I10 Essential (primary) hypertension: Secondary | ICD-10-CM | POA: Diagnosis not present

## 2014-05-21 DIAGNOSIS — E1122 Type 2 diabetes mellitus with diabetic chronic kidney disease: Secondary | ICD-10-CM | POA: Diagnosis not present

## 2014-05-22 DIAGNOSIS — Z9189 Other specified personal risk factors, not elsewhere classified: Secondary | ICD-10-CM | POA: Diagnosis not present

## 2014-05-22 DIAGNOSIS — R208 Other disturbances of skin sensation: Secondary | ICD-10-CM | POA: Diagnosis not present

## 2014-05-22 DIAGNOSIS — E039 Hypothyroidism, unspecified: Secondary | ICD-10-CM | POA: Diagnosis not present

## 2014-05-22 DIAGNOSIS — Z794 Long term (current) use of insulin: Secondary | ICD-10-CM | POA: Diagnosis not present

## 2014-05-22 DIAGNOSIS — E1165 Type 2 diabetes mellitus with hyperglycemia: Secondary | ICD-10-CM | POA: Diagnosis not present

## 2014-05-22 DIAGNOSIS — L0291 Cutaneous abscess, unspecified: Secondary | ICD-10-CM | POA: Diagnosis not present

## 2014-05-28 DIAGNOSIS — E1122 Type 2 diabetes mellitus with diabetic chronic kidney disease: Secondary | ICD-10-CM | POA: Diagnosis not present

## 2014-05-28 DIAGNOSIS — Z0000 Encounter for general adult medical examination without abnormal findings: Secondary | ICD-10-CM | POA: Insufficient documentation

## 2014-05-28 DIAGNOSIS — I10 Essential (primary) hypertension: Secondary | ICD-10-CM | POA: Diagnosis not present

## 2014-05-28 DIAGNOSIS — F3341 Major depressive disorder, recurrent, in partial remission: Secondary | ICD-10-CM | POA: Diagnosis not present

## 2014-05-28 DIAGNOSIS — E1169 Type 2 diabetes mellitus with other specified complication: Secondary | ICD-10-CM | POA: Diagnosis not present

## 2014-07-03 DIAGNOSIS — R208 Other disturbances of skin sensation: Secondary | ICD-10-CM | POA: Diagnosis not present

## 2014-07-03 DIAGNOSIS — L0291 Cutaneous abscess, unspecified: Secondary | ICD-10-CM | POA: Diagnosis not present

## 2014-07-03 DIAGNOSIS — L438 Other lichen planus: Secondary | ICD-10-CM | POA: Diagnosis not present

## 2014-07-09 DIAGNOSIS — N183 Chronic kidney disease, stage 3 (moderate): Secondary | ICD-10-CM | POA: Diagnosis not present

## 2014-07-09 DIAGNOSIS — I10 Essential (primary) hypertension: Secondary | ICD-10-CM | POA: Diagnosis not present

## 2014-07-09 DIAGNOSIS — N2581 Secondary hyperparathyroidism of renal origin: Secondary | ICD-10-CM | POA: Diagnosis not present

## 2014-07-09 DIAGNOSIS — E1122 Type 2 diabetes mellitus with diabetic chronic kidney disease: Secondary | ICD-10-CM | POA: Diagnosis not present

## 2014-07-18 DIAGNOSIS — F331 Major depressive disorder, recurrent, moderate: Secondary | ICD-10-CM | POA: Diagnosis not present

## 2014-07-23 DIAGNOSIS — D631 Anemia in chronic kidney disease: Secondary | ICD-10-CM | POA: Diagnosis not present

## 2014-07-23 DIAGNOSIS — I10 Essential (primary) hypertension: Secondary | ICD-10-CM | POA: Diagnosis not present

## 2014-07-23 DIAGNOSIS — N2581 Secondary hyperparathyroidism of renal origin: Secondary | ICD-10-CM | POA: Diagnosis not present

## 2014-07-23 DIAGNOSIS — N183 Chronic kidney disease, stage 3 (moderate): Secondary | ICD-10-CM | POA: Diagnosis not present

## 2014-07-28 ENCOUNTER — Ambulatory Visit: Payer: Medicare Other | Admitting: Urology

## 2014-07-28 VITALS — BP 122/70 | HR 89 | Ht 62.0 in | Wt 192.7 lb

## 2014-07-28 DIAGNOSIS — L439 Lichen planus, unspecified: Secondary | ICD-10-CM

## 2014-07-28 DIAGNOSIS — N952 Postmenopausal atrophic vaginitis: Secondary | ICD-10-CM | POA: Insufficient documentation

## 2014-07-28 LAB — URINALYSIS, COMPLETE
BILIRUBIN UA: NEGATIVE
KETONES UA: NEGATIVE
Leukocytes, UA: NEGATIVE
Nitrite, UA: NEGATIVE
PH UA: 5.5 (ref 5.0–7.5)
Protein, UA: NEGATIVE
RBC, UA: NEGATIVE
Specific Gravity, UA: 1.01 (ref 1.005–1.030)
Urobilinogen, Ur: 0.2 mg/dL (ref 0.2–1.0)

## 2014-07-28 LAB — MICROSCOPIC EXAMINATION: Bacteria, UA: NONE SEEN

## 2014-07-28 MED ORDER — ESTRADIOL 0.1 MG/GM VA CREA: 1.0000 | TOPICAL_CREAM | VAGINAL | Status: DC

## 2014-07-28 NOTE — Progress Notes (Unsigned)
Patient ID: Abigail Walker, female   DOB: May 12, 1945, 69 y.o.   MRN: [id number]  Subjective: Abigail Walker is a 69 yo WF who is sent in consultation by Dr. Nehemiah Massed for lichen planus with atrophic vaginitis.  She reports that the introital stenosis is starting to block the orifice and when she voids she has a splaying stream.   She is wearing pads just in case and she can have some UUI and SUI if she delays voiding.   She has had no dysuria or hematuria.  She has had no GU surgery and only infrequent UTI's.   She has CKD but has had no stones.   ROS:  Review of Systems  Constitutional: Positive for malaise/fatigue.  HENT: Positive for congestion.   Eyes: Positive for blurred vision and double vision.  Respiratory: Positive for shortness of breath.   Cardiovascular: Positive for leg swelling.  Gastrointestinal: Positive for heartburn and constipation.  Musculoskeletal: Positive for back pain.  Endo/Heme/Allergies: Bruises/bleeds easily.  Psychiatric/Behavioral: Positive for depression. The patient is nervous/anxious.   All other systems reviewed and are negative.   Allergies  Allergen Reactions  . Sulfa Antibiotics Nausea And Vomiting  . Amoxicillin-Pot Clavulanate Rash    Outpatient Encounter Prescriptions as of 07/28/2014  Medication Sig Note  . amLODipine (NORVASC) 5 MG tablet TAKE ONE TABLET BY MOUTH ONCE DAILY 07/28/2014: Received from: [facility]  . azelastine (ASTELIN) 0.1 % nasal spray Place into the nose. 07/28/2014: Received from: [place]  . buPROPion (WELLBUTRIN XL) 150 MG 24 hr tablet Take 3 tablet po QD as directed 07/28/2014: Received from: [facility]  . Cholecalciferol (VITAMIN D3) 2000 UNITS capsule Take by mouth. 07/28/2014: Received from: [place]  . clobetasol cream (TEMOVATE) 0.05 % Apply topically. 07/28/2014: Received from: [place]  . clotrimazole (MYCELEX) 10 MG troche  Take 10 mg by mouth 5 (five) times daily.   . fluconazole (DIFLUCAN) 150 MG tablet Take 150 mg by mouth daily.   . fluticasone (FLONASE) 50 MCG/ACT nasal spray Place into the nose. 07/28/2014: Received from: [place]  . insulin glargine (LANTUS) 100 UNIT/ML injection Inject into the skin at bedtime.   . insulin lispro (HUMALOG) 100 UNIT/ML injection Inject into the skin 3 (three) times daily before meals.   . insulin NPH-regular Human (NOVOLIN 70/30) (70-30) 100 UNIT/ML injection Inject 16 units before breakfast and 26 units before supper as directed 07/28/2014: Received from: [facility]  . Insulin Pen Needle 31G X 5 MM MISC by Does not apply route.   . Insulin Syringe-Needle U-100 (INSULIN SYRINGE 1CC/31GX5/16") 31G X 5/16" 1 ML MISC USE ONE SYRINGE TWICE DAILY AS DIRECTED WITH INSULIN VIALS 07/28/2014: Received from: [facility]  . levothyroxine (SYNTHROID, LEVOTHROID) 75 MCG tablet TAKE ONE TABLET BY MOUTH ONCE DAILY 07/28/2014: Received from: [facility]  . lisinopril (PRINIVIL,ZESTRIL) 20 MG tablet TAKE ONE TABLET BY MOUTH ONCE DAILY 07/28/2014: Received from: [facility]  . nystatin cream (MYCOSTATIN)  07/28/2014: Received from: [place]  . pantoprazole (PROTONIX) 40 MG tablet Take by mouth. 07/28/2014: Received from: [facility]  . PARoxetine (PAXIL) 40 MG tablet Take by mouth. 07/28/2014: Received from: [place]  . pimecrolimus (ELIDEL) 1 % cream Apply topically 2 (two) times daily.   . pravastatin (PRAVACHOL) 80 MG tablet TAKE ONE TABLET BY MOUTH ONCE DAILY 07/28/2014: Received from:  [place]  . quinapril (ACCUPRIL) 20 MG tablet Take 20 mg by mouth at bedtime.   . tacrolimus (PROGRAF) 1 MG capsule Take by mouth. 07/28/2014: Received from: [place]  . torsemide (DEMADEX) 10 MG tablet Take by mouth. 07/28/2014:  Received from: [place]  . [DISCONTINUED] metFORMIN (GLUCOPHAGE) 500 MG tablet Take 500 mg by mouth 2 (two) times daily with a meal.   . [DISCONTINUED] sitaGLIPtin (JANUVIA) 100 MG tablet Take 100 mg by mouth daily.   . metroNIDAZOLE (FLAGYL) 500 MG tablet Take 500 mg by mouth 3 (three) times daily.   . [DISCONTINUED] amLODipine (NORVASC) 5 MG tablet Take 5 mg by mouth daily.   . [DISCONTINUED] buPROPion (WELLBUTRIN XL) 150 MG 24 hr tablet Take 150 mg by mouth daily.   . [DISCONTINUED] clobetasol ointment (TEMOVATE) 0.17 % Apply 1 application topically 2 (two) times daily.   . [DISCONTINUED] levothyroxine (SYNTHROID, LEVOTHROID) 75 MCG tablet Take 75 mcg by mouth daily before breakfast.   . [DISCONTINUED] pantoprazole (PROTONIX) 40 MG tablet Take 40 mg by mouth daily.   . [DISCONTINUED] PARoxetine (PAXIL) 20 MG tablet Take 20 mg by mouth daily.   . [DISCONTINUED] pravastatin (PRAVACHOL) 80 MG tablet Take 80 mg by mouth daily.   . [DISCONTINUED] sulfamethoxazole-trimethoprim (BACTRIM DS,SEPTRA DS) 800-160 MG per tablet Take 1 tablet by mouth 2 (two) times daily.   . [DISCONTINUED] tacrolimus (PROGRAF) 1 MG capsule Take 1 mg by mouth 2 (two) times daily.   . [DISCONTINUED] torsemide (DEMADEX) 10 MG tablet Take 10 mg by mouth daily.    No facility-administered encounter medications on file as of 07/28/2014.    Past Medical History  Diagnosis Date  . Diabetes mellitus without complication   . HBV (hepatitis B virus) infection   . High blood pressure   . Hives     food related  . Sleep apnea   . Thyroid disease     Past Surgical History  Procedure Laterality Date  . Tonsillectomy    . Tubal ligation    . Hemorrhoid surgery    . Implantable contact lens implantation      History   Social History  . Marital Status: Single    Spouse Name: N/A  . Number of Children: N/A  . Years of Education: N/A   Occupational History  . Not on file.   Social History Main  Topics  . Smoking status: Former Smoker    Quit date: 07/27/1993  . Smokeless tobacco: Not on file  . Alcohol Use: No  . Drug Use: No  . Sexual Activity: Not on file   Other Topics Concern  . Not on file   Social History Narrative    Family History  Problem Relation Age of Onset  . Heart disease Father   . Cancer Mother   . Kidney failure Maternal Aunt        Objective: Filed Vitals:   07/28/14 1516  BP: 122/70  Pulse: 89     Physical Exam  Constitutional: She is oriented to person, place, and time and well-developed, well-nourished, and in no distress.  HENT:  Head: Normocephalic and atraumatic.  Neck: Normal range of motion. Neck supple. No thyromegaly present.  Cardiovascular: Normal rate, regular rhythm and normal heart sounds.   Pulmonary/Chest: Effort normal and breath sounds normal. No respiratory distress.  Abdominal: Soft. Bowel sounds are normal. She exhibits no mass. There is no tenderness. There is no guarding.  Genitourinary:  She has no real introital stenosis but she is very tender in the vagina and has marked atrophic vaginitis.  Her urethral meatus appears normal but she was too tender for a full exam.    Musculoskeletal: Normal range of motion. She exhibits edema.  Lymphadenopathy:    She has no cervical adenopathy.  Neurological: She is alert and oriented to person, place, and time.  Skin: Skin is warm and dry.  Psychiatric: Mood and affect normal.    Lab Results:  No results for input(s): WBC, HGB, HCT, PLT in the last 72 hours. BMET No results for input(s): NA, K, CL, CO2, GLUCOSE, BUN, CREATININE, CALCIUM in the last 72 hours. PT/INR No results for input(s): LABPROT, INR in the last 72 hours. ABG No results for input(s): PHART, HCO3 in the last 72 hours.  Invalid input(s): PCO2, PO2  Urine dipstick shows positive for glucose.  Micro exam: negative for WBC's or RBC's, 0-5 WBC's per HPF and 0-2 RBC's per HPF.  Studies/Results: No  results found.   Assessment: She has lichen planus with atrophic vaginitis but no real introital stenosis. She has some mixed incontinence but her main concern is about possible stenosis.  Plan: I am going to put her on Estrace Cream and have her return in 1-2 months for reevaluation.   CC: Kirk Ruths., MD  And Dr. Vinnie Level 07/28/2014

## 2014-07-30 ENCOUNTER — Telehealth: Payer: Self-pay | Admitting: Urology

## 2014-07-30 NOTE — Telephone Encounter (Signed)
I called the pt to verify her pharmacy. So we can fax over her Estrace prescription order by Dr. Jeffie Pollock.

## 2014-07-31 ENCOUNTER — Telehealth: Payer: Self-pay | Admitting: Family Medicine

## 2014-07-31 ENCOUNTER — Ambulatory Visit
Admission: RE | Admit: 2014-07-31 | Discharge: 2014-07-31 | Disposition: A | Discharge status: Home / Self Care | Payer: Medicare Other | Source: Ambulatory Visit | Attending: Internal Medicine | Admitting: Internal Medicine

## 2014-07-31 ENCOUNTER — Inpatient Hospital Stay: Payer: Medicare Other | Attending: Internal Medicine | Admitting: Internal Medicine

## 2014-07-31 VITALS — BP 137/63 | HR 82 | Temp 97.8°F | Resp 18 | Ht 61.5 in | Wt 193.3 lb

## 2014-07-31 DIAGNOSIS — D472 Monoclonal gammopathy: Secondary | ICD-10-CM

## 2014-07-31 DIAGNOSIS — M5136 Other intervertebral disc degeneration, lumbar region: Secondary | ICD-10-CM | POA: Diagnosis not present

## 2014-07-31 DIAGNOSIS — Z794 Long term (current) use of insulin: Secondary | ICD-10-CM | POA: Diagnosis not present

## 2014-07-31 DIAGNOSIS — M545 Low back pain: Secondary | ICD-10-CM

## 2014-07-31 DIAGNOSIS — E119 Type 2 diabetes mellitus without complications: Secondary | ICD-10-CM

## 2014-07-31 DIAGNOSIS — M47814 Spondylosis without myelopathy or radiculopathy, thoracic region: Secondary | ICD-10-CM | POA: Diagnosis not present

## 2014-07-31 DIAGNOSIS — E079 Disorder of thyroid, unspecified: Secondary | ICD-10-CM

## 2014-07-31 DIAGNOSIS — Z87891 Personal history of nicotine dependence: Secondary | ICD-10-CM

## 2014-07-31 DIAGNOSIS — M47816 Spondylosis without myelopathy or radiculopathy, lumbar region: Secondary | ICD-10-CM | POA: Diagnosis not present

## 2014-07-31 DIAGNOSIS — M47812 Spondylosis without myelopathy or radiculopathy, cervical region: Secondary | ICD-10-CM | POA: Diagnosis not present

## 2014-07-31 DIAGNOSIS — Z79899 Other long term (current) drug therapy: Secondary | ICD-10-CM

## 2014-07-31 DIAGNOSIS — M4316 Spondylolisthesis, lumbar region: Secondary | ICD-10-CM | POA: Diagnosis not present

## 2014-07-31 DIAGNOSIS — G8929 Other chronic pain: Secondary | ICD-10-CM

## 2014-07-31 DIAGNOSIS — I6523 Occlusion and stenosis of bilateral carotid arteries: Secondary | ICD-10-CM | POA: Diagnosis not present

## 2014-07-31 DIAGNOSIS — G4730 Sleep apnea, unspecified: Secondary | ICD-10-CM

## 2014-07-31 DIAGNOSIS — N183 Chronic kidney disease, stage 3 (moderate): Secondary | ICD-10-CM | POA: Diagnosis not present

## 2014-07-31 NOTE — Telephone Encounter (Signed)
Pharmacy was added to her chart. She states she thinks she has tried Estrace in the past and feels it may not work. She will try her sample and decide. She will let us know if the Estrace is not working.

## 2014-08-01 NOTE — Progress Notes (Signed)
I have called pt and got her voicemail and asked if she can return my call for results of lab and xray and talk about future appt for her.

## 2014-08-04 NOTE — Progress Notes (Signed)
[place]  [phone] [phone]     ID: Alanson Aly OB: [birth year]  MR#: [id number]  XBL#:[id number]  Patient Care Team: Kirk Ruths, MD as PCP - General (Internal Medicine)  CHIEF COMPLAINT/DIAGNOSIS:  Recently diagnosed monoclonal paraproteinemia (0.5 g and 0.8 g on SPEP done in February and March 2016, random UPEP negative for M spike. Serum kappa/lambda ratio of mildly elevated at 1.87, serum free kappa 31.34, free lambda 16.73. Labs on 07/07/2014 showed hemoglobin 11.8, platelets 192, WBC 3600, ANC 1900, creatinine 1.74, calcium 9.7, LFTs unremarkable).  -  Patient referred here for hematology evaluation.    HISTORY OF PRESENT ILLNESS:  Abigail Walker is a 69 year old female patient with past medical history as described below. Patient recently was seen by a nephrologist Dr. Zollie Scale for chronic kidney disease stage III and had workup done. Labs on 03/08/2014 showed serum protein electrophoresis with M spike of 0.5 g/dL, random UPEP was negative for M spike. More recently labs on 07/07/2014 showed hemoglobin 11.8, platelets 192, WBC 3600, ANC 1900, creatinine 1.74, calcium 9.7, LFTs unremarkable. She had SPEP on 04/09/2014 which again showed M spike of 0.8 g. Clinically states that she has chronic fatigue and mild dyspnea on exertion. She denies new bone pains, has chronic arthritis in the lower back and lower extremities. No new paresthesias next with this. No fevers or night sweats. Appetite is good, denies unintentional weight loss.  REVIEW OF SYSTEMS:   ROS CONSTITUTIONAL: As in HPI above. No chills, fever or sweats.    ENT:  No headache, dizziness or epistaxis. No ear or jaw pain. Has chronic vision changes and hearing changes. Intermittent sinus drainage, clear. RESPIRATORY:   No cough. Chronic mild dyspnea on exertion. No wheezing. No hemoptysis. CARDIAC:  No palpitations.  No retrosternal chest pain. No orthopnea, PND. GI: GERD. No  abdominal pain, nausea or vomiting. Intermittent constipation. History of hemorrhoids  GU:  No dysuria or hematuria.  SKIN: No rashes or pruritus. HEMATOLOGIC: Easy minor skin bruising, bleeds longer on cuts. Otherwise denies bleeding symptoms MUSCULOSKELETAL:  Chronic arthritis in the lower back and lower extremities. Denies new bone pains.  EXTREMITY:  No new swelling or pain.  NEURO:  No focal weakness. No numbness or tingling of extremities.  No seizures.   ENDOCRINE:  No polyuria or polydipsia.   PAST MEDICAL HISTORY: Reviewed. Past Medical History  Diagnosis Date  . Diabetes mellitus without complication   . HBV (hepatitis B virus) infection   . High blood pressure   . Hives     food related  . Sleep apnea   . Thyroid disease     PAST SURGICAL HISTORY: Reviewed. Past Surgical History  Procedure Laterality Date  . Tonsillectomy    . Tubal ligation    . Hemorrhoid surgery    . Implantable contact lens implantation      FAMILY HISTORY: Reviewed. Family History  Problem Relation Age of Onset  . Heart disease Father   . Cancer Mother   . Kidney failure Maternal Aunt     SOCIAL HISTORY: Reviewed. History  Substance Use Topics  . Smoking status: Former Smoker    Quit date: 07/27/1993  . Smokeless tobacco: Not on file  . Alcohol Use: No    Allergies  Allergen Reactions  . Sulfa Antibiotics Nausea And Vomiting  . Amoxicillin-Pot Clavulanate Rash    Current Outpatient Prescriptions  Medication Sig Dispense Refill  . acetaminophen (TYLENOL) 500 MG tablet Take 500 mg by  mouth every 6 (six) hours as needed for moderate pain.    Marland Kitchen amLODipine (NORVASC) 5 MG tablet TAKE ONE TABLET BY MOUTH ONCE DAILY    . azelastine (ASTELIN) 0.1 % nasal spray Place into the nose.    Marland Kitchen buPROPion (WELLBUTRIN XL) 150 MG 24 hr tablet Take 3 tablet po QD as directed    . Cholecalciferol (VITAMIN D3) 2000 UNITS capsule Take by mouth.    . clobetasol cream (TEMOVATE) 0.05 % Apply  topically.    . clotrimazole (MYCELEX) 10 MG troche Take 10 mg by mouth 5 (five) times daily.    Marland Kitchen estradiol (ESTRACE) 0.1 MG/GM vaginal cream Place 1 Applicatorful vaginally 3 (three) times a week. 42.5 g 12  . fluconazole (DIFLUCAN) 150 MG tablet Take 150 mg by mouth daily.    . fluticasone (FLONASE) 50 MCG/ACT nasal spray Place into the nose.    . insulin glargine (LANTUS) 100 UNIT/ML injection Inject into the skin at bedtime.    . insulin lispro (HUMALOG) 100 UNIT/ML injection Inject into the skin 3 (three) times daily before meals.    . insulin NPH-regular Human (NOVOLIN 70/30) (70-30) 100 UNIT/ML injection Inject 16 units before breakfast and 26 units before supper as directed    . Insulin Pen Needle 31G X 5 MM MISC by Does not apply route.    . Insulin Syringe-Needle U-100 (INSULIN SYRINGE 1CC/31GX5/16") 31G X 5/16" 1 ML MISC USE ONE SYRINGE TWICE DAILY AS DIRECTED WITH INSULIN VIALS    . levothyroxine (SYNTHROID, LEVOTHROID) 75 MCG tablet TAKE ONE TABLET BY MOUTH ONCE DAILY    . lisinopril (PRINIVIL,ZESTRIL) 20 MG tablet TAKE ONE TABLET BY MOUTH ONCE DAILY    . metroNIDAZOLE (FLAGYL) 500 MG tablet Take 500 mg by mouth 3 (three) times daily.    Marland Kitchen nystatin cream (MYCOSTATIN)     . pantoprazole (PROTONIX) 40 MG tablet Take by mouth.    Marland Kitchen PARoxetine (PAXIL) 40 MG tablet Take by mouth.    . pimecrolimus (ELIDEL) 1 % cream Apply topically 2 (two) times daily.    . pravastatin (PRAVACHOL) 80 MG tablet TAKE ONE TABLET BY MOUTH ONCE DAILY    . quinapril (ACCUPRIL) 20 MG tablet Take 20 mg by mouth at bedtime.    . tacrolimus (PROGRAF) 1 MG capsule Take by mouth.    . torsemide (DEMADEX) 10 MG tablet Take by mouth.     No current facility-administered medications for this visit.    PHYSICAL EXAM: Filed Vitals:   07/31/14 1121  BP: 137/63  Pulse: 82  Temp: 97.8 F (36.6 C)  Resp: 18     Body mass index is 35.94 kg/(m^2).    ECOG FS:1 - Symptomatic but completely ambulatory  GENERAL:  Patient is alert and oriented and in no acute distress. There is no icterus or pallor. HEENT: EOMs intact.  No cervical lymphadenopathy. CVS: S1S2, regular LUNGS: Bilaterally clear to auscultation, no rhonchi. ABDOMEN: Soft, nontender. No hepatosplenomegaly clinically.  NEURO: grossly nonfocal, cranial nerves are intact.  EXTREMITIES: No pedal edema.  SKIN: No major bruising or rash MUSCULOSKELETAL: No obvious joint deformity or swelling LYMPHATICS: No palpable adenopathy in axillary or inguinal areas.   LAB RESULTS:    Component Value Date/Time   NA 140 05/19/2011 0409   K 4.2 05/19/2011 0409   CL 105 05/19/2011 0409   CO2 28 05/19/2011 0409   GLUCOSE 158* 05/19/2011 0409   BUN 15 05/19/2011 0409   CREATININE 1.18 05/19/2011 0409   CALCIUM 9.2  05/19/2011 0409   PROT 7.1 05/14/2011 1432   ALBUMIN 3.2* 05/14/2011 1432   AST 20 05/14/2011 1432   ALT 20 05/14/2011 1432   ALKPHOS 88 05/14/2011 1432   GFRNONAA 48* 05/19/2011 0409   GFRAA 56* 05/19/2011 0409   Lab Results  Component Value Date   WBC 3.7 05/18/2011   NEUTROABS 1.6 05/18/2011   HGB 11.4* 05/18/2011   HCT 33.8* 05/18/2011   MCV 90 05/18/2011   PLT 155 05/18/2011     STUDIES:    ASSESSMENT / PLAN:   Recently diagnosed monoclonal paraproteinemia (0.5 g and 0.8 g on SPEP done in February and March 2016, random UPEP negative for M spike. Labs on 07/07/2014 showed hemoglobin 11.8, platelets 192, WBC 3600, ANC 1900, creatinine 1.74, calcium 9.7, LFTs unremarkable).  -  Patient referred here for hematology evaluation. I have reviewed records sent by referring physician and also lab reports. I have explained to patient that she does have persistent faintly small monoclonal protein spike on SPEP done on 2 separate occasions up to 0.8 g/dL. Random UPEP was negative for M spike. She does have chronic low back pain but denies new bone pains. Hemoglobin has been just below normal range at 11.4-11.8, no hypercalcemia. She  does have chronic kidney disease stage III with creatinine of 1.74 but this could be from diabetes also. 3/lambda ratio was slightly abnormal at 1.87. Patient explained that she most likely has monoclonal gammopathy of unknown significance but given new diagnosis and back pain, recommend getting skeletal x-ray survey to rule out lytic lesions. If this is negative, then plan will be continued monitoring, if she does have lytic lesions on x-rays, then may need to pursue bone marrow biopsy to rule out possibility of occult myeloma. If x-ray is negative, plan is continued surveillance and will get repeat MGUS increases at 6 months and see her back at 11-12 months. Otherwise we will need to see her back sooner and plan bone marrow biopsy. In between visits, the patient has been advised to call or come to the ER in case of fevers, night sweats, unintentional weight loss, new bone pains are enlarged lymph node masses felt on self-exam. Patient is agreeable to this plan see her back.    Leia Alf, MD   08/04/2014 9:04 PM

## 2014-08-05 NOTE — Progress Notes (Signed)
Called pt and spoke to her about the bone survey in addition to the M spike that was on blood work sent to office on first visit.  Dr. Ma Hillock had discussed that she may have myeloma and may need bone marrow bx.  Based on results of both above things that is what pandit is rec:. Explained bone marrow bx.  Pt states she is a wimp when it comes to pain and would like to be knocked out to have procedure.  I told her that it can be done in ct guided by radiologist and she will be given conscious sedation for the procedure to be done.  Pt is agreeable and I told her I will make the appt and call her back with date and time.  She did say that she can't do 7/25, 8/4, 8/9, 8/15.  I will make sure it is not these dates and will contact her.

## 2014-08-07 ENCOUNTER — Other Ambulatory Visit: Payer: Self-pay | Admitting: Internal Medicine

## 2014-08-07 ENCOUNTER — Other Ambulatory Visit: Payer: Self-pay | Admitting: *Deleted

## 2014-08-07 DIAGNOSIS — R937 Abnormal findings on diagnostic imaging of other parts of musculoskeletal system: Secondary | ICD-10-CM

## 2014-08-07 DIAGNOSIS — R948 Abnormal results of function studies of other organs and systems: Secondary | ICD-10-CM

## 2014-08-07 DIAGNOSIS — D472 Monoclonal gammopathy: Secondary | ICD-10-CM

## 2014-08-11 DIAGNOSIS — Z961 Presence of intraocular lens: Secondary | ICD-10-CM | POA: Diagnosis not present

## 2014-08-13 ENCOUNTER — Other Ambulatory Visit: Payer: Self-pay | Admitting: Physician Assistant

## 2014-08-14 ENCOUNTER — Ambulatory Visit
Admission: RE | Admit: 2014-08-14 | Discharge: 2014-08-14 | Disposition: A | Discharge status: Home / Self Care | Payer: Medicare Other | Source: Ambulatory Visit | Attending: Internal Medicine | Admitting: Internal Medicine

## 2014-08-14 DIAGNOSIS — K219 Gastro-esophageal reflux disease without esophagitis: Secondary | ICD-10-CM | POA: Insufficient documentation

## 2014-08-14 DIAGNOSIS — F329 Major depressive disorder, single episode, unspecified: Secondary | ICD-10-CM | POA: Insufficient documentation

## 2014-08-14 DIAGNOSIS — G4730 Sleep apnea, unspecified: Secondary | ICD-10-CM | POA: Diagnosis not present

## 2014-08-14 DIAGNOSIS — R948 Abnormal results of function studies of other organs and systems: Secondary | ICD-10-CM | POA: Diagnosis not present

## 2014-08-14 DIAGNOSIS — E119 Type 2 diabetes mellitus without complications: Secondary | ICD-10-CM | POA: Insufficient documentation

## 2014-08-14 DIAGNOSIS — I10 Essential (primary) hypertension: Secondary | ICD-10-CM | POA: Insufficient documentation

## 2014-08-14 DIAGNOSIS — D472 Monoclonal gammopathy: Secondary | ICD-10-CM | POA: Diagnosis not present

## 2014-08-14 DIAGNOSIS — E039 Hypothyroidism, unspecified: Secondary | ICD-10-CM | POA: Insufficient documentation

## 2014-08-14 DIAGNOSIS — E079 Disorder of thyroid, unspecified: Secondary | ICD-10-CM | POA: Diagnosis not present

## 2014-08-14 DIAGNOSIS — N189 Chronic kidney disease, unspecified: Secondary | ICD-10-CM | POA: Diagnosis not present

## 2014-08-14 HISTORY — DX: Chronic kidney disease, unspecified: N18.9

## 2014-08-14 HISTORY — DX: Major depressive disorder, single episode, unspecified: F32.9

## 2014-08-14 HISTORY — DX: Malignant (primary) neoplasm, unspecified: C80.1

## 2014-08-14 HISTORY — DX: Gastro-esophageal reflux disease without esophagitis: K21.9

## 2014-08-14 HISTORY — DX: Hypothyroidism, unspecified: E03.9

## 2014-08-14 HISTORY — DX: Depression, unspecified: F32.A

## 2014-08-14 LAB — DIFFERENTIAL
BAND NEUTROPHILS: 0 % (ref 0–10)
BASOS ABS: 0 10*3/uL (ref 0–0.1)
BASOS PCT: 0 % (ref 0–1)
Basophils Absolute: 0 10*3/uL (ref 0–0.1)
Basophils Relative: 1 %
Blasts: 0 %
EOS ABS: 0.2 10*3/uL (ref 0–0.7)
Eosinophils Absolute: 0.2 10*3/uL (ref 0–0.7)
Eosinophils Relative: 4 %
Eosinophils Relative: 4 % (ref 0–5)
Lymphocytes Relative: 38 %
Lymphocytes Relative: 34 % (ref 12–46)
Lymphs Abs: 1.6 10*3/uL (ref 1.0–3.6)
Lymphs Abs: 1.5 10*3/uL (ref 1.0–3.6)
METAMYELOCYTES PCT: 0 %
MONO ABS: 0.3 10*3/uL (ref 0.2–0.9)
MONOS PCT: 11 %
MONOS PCT: 8 % (ref 3–12)
Monocytes Absolute: 0.5 10*3/uL (ref 0.2–0.9)
Myelocytes: 0 %
NEUTROS ABS: 2.3 10*3/uL (ref 1.4–6.5)
NEUTROS PCT: 54 % (ref 43–77)
NRBC: 0 /100{WBCs}
Neutro Abs: 2 10*3/uL (ref 1.4–6.5)
Neutrophils Relative %: 46 %
Other: 0 %
PROMYELOCYTES ABS: 0 %

## 2014-08-14 LAB — CBC
HCT: 36.8 % (ref 35.0–47.0)
Hemoglobin: 12.1 g/dL (ref 12.0–16.0)
MCH: 28.6 pg (ref 26.0–34.0)
MCHC: 32.8 g/dL (ref 32.0–36.0)
MCV: 87.2 fL (ref 80.0–100.0)
Platelets: 186 10*3/uL (ref 150–440)
RBC: 4.22 MIL/uL (ref 3.80–5.20)
RDW: 14.1 % (ref 11.5–14.5)
WBC: 4.3 10*3/uL (ref 3.6–11.0)

## 2014-08-14 LAB — PROTIME-INR
INR: 0.98
PROTHROMBIN TIME: 13.2 s (ref 11.4–15.0)

## 2014-08-14 LAB — GLUCOSE, CAPILLARY: Glucose-Capillary: 131 mg/dL — ABNORMAL HIGH (ref 65–99)

## 2014-08-14 MED ORDER — FENTANYL CITRATE (PF) 100 MCG/2ML IJ SOLN
INTRAMUSCULAR | Status: AC | PRN
  Administered 2014-08-14: 50 ug via INTRAVENOUS
  Administered 2014-08-14: 25 ug via INTRAVENOUS

## 2014-08-14 MED ORDER — HYDROCODONE-ACETAMINOPHEN 5-325 MG PO TABS: 1.0000 | ORAL_TABLET | ORAL | Status: DC | PRN

## 2014-08-14 MED ORDER — SODIUM CHLORIDE 0.9 % IV SOLN
INTRAVENOUS | Status: DC
  Administered 2014-08-14: 09:00:00 via INTRAVENOUS

## 2014-08-14 MED ORDER — MIDAZOLAM HCL 5 MG/5ML IJ SOLN
INTRAMUSCULAR | Status: AC | PRN
  Administered 2014-08-14 (×2): 1 mg via INTRAVENOUS

## 2014-08-14 NOTE — Procedures (Signed)
Interventional Radiology Procedure Note  Procedure: CT guided aspirate and core biopsy of right iliac bone Complications: None Recommendations: - Bedrest supine x 1 hour - Hydrocodone PRN  Pain - Follow biopsy results  Aniello Christopoulos T. Kathlene Cote, M.D Pager:  [phone]

## 2014-08-14 NOTE — H&P (Signed)
Chief Complaint: Here for bone marrow biopsy.  M protein spike and possible multiple myeloma.  Referring Physician(s): Pandit,Sandeep  History of Present Illness: Abigail Walker is a 69 y.o. female here for bone marrow biopsy for work up of myeloma/paraproteinemia.  Past Medical History  Diagnosis Date  . Diabetes mellitus without complication   . HBV (hepatitis B virus) infection   . High blood pressure   . Hives     food related  . Sleep apnea   . Thyroid disease   . Hypothyroidism   . Depression   . Chronic kidney disease   . GERD (gastroesophageal reflux disease)   . Cancer skin    Past Surgical History  Procedure Laterality Date  . Tonsillectomy    . Tubal ligation    . Hemorrhoid surgery    . Implantable contact lens implantation      Allergies: Sulfa antibiotics and Amoxicillin-pot clavulanate  Medications: Prior to Admission medications   Medication Sig Start Date End Date Taking? Authorizing Provider  amLODipine (NORVASC) 5 MG tablet TAKE ONE TABLET BY MOUTH ONCE DAILY 03/20/14  Yes Historical Provider, MD  azelastine (ASTELIN) 0.1 % nasal spray Place into the nose.   Yes Historical Provider, MD  buPROPion (WELLBUTRIN XL) 150 MG 24 hr tablet Take 3 tablet po QD as directed   Yes Historical Provider, MD  Cholecalciferol (VITAMIN D3) 2000 UNITS capsule Take by mouth.   Yes Historical Provider, MD  clobetasol cream (TEMOVATE) 0.05 % Apply topically.   Yes Historical Provider, MD  clotrimazole (MYCELEX) 10 MG troche Take 10 mg by mouth 5 (five) times daily.   Yes Historical Provider, MD  estradiol (ESTRACE) 0.1 MG/GM vaginal cream Place 1 Applicatorful vaginally 3 (three) times a week. 07/28/14  Yes Irine Seal, MD  fluticasone [facility]) 50 MCG/ACT nasal spray Place into the nose. 11/27/13 11/27/14 Yes Historical Provider, MD  insulin NPH-regular Human (NOVOLIN 70/30) (70-30) 100 UNIT/ML injection Inject 16 units before breakfast and 26 units before supper as  directed 11/13/13  Yes Historical Provider, MD  Insulin Pen Needle 31G X 5 MM MISC by Does not apply route.   Yes Historical Provider, MD  Insulin Syringe-Needle U-100 (INSULIN SYRINGE 1CC/31GX5/16") 31G X 5/16" 1 ML MISC USE ONE SYRINGE TWICE DAILY AS DIRECTED WITH INSULIN VIALS 05/26/14  Yes Historical Provider, MD  levothyroxine (SYNTHROID, LEVOTHROID) 75 MCG tablet TAKE ONE TABLET BY MOUTH ONCE DAILY 05/21/14  Yes Historical Provider, MD  lisinopril (PRINIVIL,ZESTRIL) 20 MG tablet TAKE ONE TABLET BY MOUTH ONCE DAILY 05/21/14  Yes Historical Provider, MD  nystatin cream (MYCOSTATIN)  01/08/14  Yes Historical Provider, MD  pantoprazole (PROTONIX) 40 MG tablet Take by mouth. 07/07/14  Yes Historical Provider, MD  PARoxetine (PAXIL) 40 MG tablet Take by mouth.   Yes Historical Provider, MD  pravastatin (PRAVACHOL) 80 MG tablet TAKE ONE TABLET BY MOUTH ONCE DAILY 04/03/14  Yes Historical Provider, MD  tacrolimus (PROGRAF) 1 MG capsule Take by mouth.   Yes Historical Provider, MD  torsemide (DEMADEX) 10 MG tablet Take by mouth.   Yes Historical Provider, MD  acetaminophen (TYLENOL) 500 MG tablet Take 500 mg by mouth every 6 (six) hours as needed for moderate pain.    Historical Provider, MD  pimecrolimus (ELIDEL) 1 % cream Apply topically 2 (two) times daily.    Historical Provider, MD  quinapril (ACCUPRIL) 20 MG tablet Take 20 mg by mouth at bedtime.    Historical Provider, MD  Family History  Problem Relation Age of Onset  . Heart disease Father   . Cancer Mother   . Kidney failure Maternal Aunt     History   Social History  . Marital Status: Single    Spouse Name: N/A  . Number of Children: N/A  . Years of Education: N/A   Social History Main Topics  . Smoking status: Former Smoker    Quit date: 07/27/1993  . Smokeless tobacco: Not on file  . Alcohol Use: No  . Drug Use: No  . Sexual Activity: Not on file   Other Topics Concern  . None   Social History Narrative    Review of  Systems: A 12 point ROS discussed and pertinent positives are indicated in the HPI above.  All other systems are negative.  Review of Systems  Constitutional: Negative.   Respiratory: Negative.   Cardiovascular: Negative.   Gastrointestinal: Negative.   Genitourinary: Negative.   Musculoskeletal: Negative.   Neurological: Negative.     Vital Signs: BP 111/52 mmHg  Pulse 87  Temp(Src) 99.1 F (37.3 C)  Resp 20  Ht 5' 1.5" (1.562 m)  Wt 190 lb (86.183 kg)  BMI 35.32 kg/m2  SpO2 100%  Physical Exam  Constitutional: No distress.  Cardiovascular: Normal rate, regular rhythm and normal heart sounds.  Exam reveals no gallop and no friction rub.   No murmur heard. Pulmonary/Chest: Effort normal and breath sounds normal. No respiratory distress. She has no wheezes. She has no rales.  Abdominal: Soft. She exhibits no distension. There is no tenderness.  Skin: She is not diaphoretic.  Vitals reviewed.   Mallampati Score:  MD Evaluation Airway: WNL Heart: WNL Abdomen: WNL Chest/ Lungs: WNL ASA  Classification: 2 Mallampati/Airway Score: One  Imaging: Dg Bone Survey Met  07/31/2014   CLINICAL DATA:  Monoclonal gammopathy.  EXAM: METASTATIC BONE SURVEY  COMPARISON:  None.  FINDINGS: Imaging of the axial and appendicular skeleton performed. Very tiny densities noted skull most likely vascular channels. No prominent lytic lesions are noted. Subtle lucency is noted right mid clavicle. Subtle lucency in the right glenoid this could be degenerative. This could be related to myeloma or metastatic disease. Subtle scattered lucencies noted throughout the cervical, thoracic, lumbar spine. Very subtle lucencies are noted in the pelvis and proximal femurs. Diffuse degenerative change noted throughout the spine. Grade 1 anterolisthesis L4 on L5. Aortoiliac atherosclerotic vascular disease. Bilateral carotid atherosclerotic vascular disease.  IMPRESSION: 1. Subtle lucencies in the mid clavicle, the  right glenoid, throughout the cervical and thoracolumbar spine, pelvis, and proximal femurs. These lucencies are very tiny. Given the monoclonal gammopathy. These changes may be secondary to of myeloma. 2. Prominent degenerative changes noted throughout the cervical, thoracic, lumbar spine. Grade 1 anterolisthesis L4 on L5 . 3. Carotid atherosclerotic vascular disease.   Electronically Signed   By: Marcello Moores  Register   On: 07/31/2014 14:18    Labs:  CBC:  Recent Labs  08/14/14 0814  WBC 4.3  HGB 12.1  HCT 36.8  PLT 186    COAGS:  Recent Labs  08/14/14 0814  INR 0.98    BMP: No results for input(s): NA, K, CL, CO2, GLUCOSE, BUN, CALCIUM, CREATININE, GFRNONAA, GFRAA in the last 8760 hours.  Invalid input(s): CMP  LIVER FUNCTION TESTS: No results for input(s): BILITOT, AST, ALT, ALKPHOS, PROT, ALBUMIN in the last 8760 hours.  TUMOR MARKERS: No results for input(s): AFPTM, CEA, CA199, CHROMGRNA in the last 8760 hours.  Assessment and  Plan:  Risks and Benefits discussed of bone marrow biopsy with the patient including, but not limited to bleeding, infection, damage to adjacent structures or low yield requiring additional tests. All of the patient's questions were answered, patient is agreeable to proceed. Consent signed and in chart.   SignedAletta Edouard T 08/14/2014, 9:53 AM

## 2014-08-20 ENCOUNTER — Telehealth: Payer: Self-pay | Admitting: *Deleted

## 2014-08-20 NOTE — Telephone Encounter (Signed)
-----  Message from Leia Alf, MD sent at 08/20/2014  1:38 PM EDT ----- Bone marrow biopsy negative for myeloma. Plan is monitoring for likely MGUS. Please notify patient and advise her to keep scheduled appts the same. Thanks.

## 2014-08-20 NOTE — Telephone Encounter (Signed)
Left message with the info below from pandit. Asked pt to call if she has questions and she has appt for labs already for 6 months lab in Jan and then see md in 1 year with labs. Which is June 2017.

## 2014-08-21 DIAGNOSIS — E1165 Type 2 diabetes mellitus with hyperglycemia: Secondary | ICD-10-CM | POA: Diagnosis not present

## 2014-08-21 DIAGNOSIS — E039 Hypothyroidism, unspecified: Secondary | ICD-10-CM | POA: Diagnosis not present

## 2014-08-26 DIAGNOSIS — E039 Hypothyroidism, unspecified: Secondary | ICD-10-CM | POA: Diagnosis not present

## 2014-08-26 DIAGNOSIS — Z9189 Other specified personal risk factors, not elsewhere classified: Secondary | ICD-10-CM | POA: Diagnosis not present

## 2014-08-26 DIAGNOSIS — E1165 Type 2 diabetes mellitus with hyperglycemia: Secondary | ICD-10-CM | POA: Diagnosis not present

## 2014-08-26 DIAGNOSIS — Z794 Long term (current) use of insulin: Secondary | ICD-10-CM | POA: Diagnosis not present

## 2014-09-29 ENCOUNTER — Encounter: Payer: Self-pay | Admitting: Urology

## 2014-09-29 ENCOUNTER — Ambulatory Visit (INDEPENDENT_AMBULATORY_CARE_PROVIDER_SITE_OTHER): Payer: Medicare Other | Admitting: Urology

## 2014-09-29 VITALS — BP 138/66 | HR 84 | Resp 16 | Ht 61.5 in | Wt 187.2 lb

## 2014-09-29 DIAGNOSIS — N952 Postmenopausal atrophic vaginitis: Secondary | ICD-10-CM | POA: Diagnosis not present

## 2014-09-29 LAB — MICROSCOPIC EXAMINATION
BACTERIA UA: NONE SEEN
RBC MICROSCOPIC, UA: NONE SEEN /HPF (ref 0–?)

## 2014-09-29 LAB — URINALYSIS, COMPLETE
BILIRUBIN UA: NEGATIVE
Glucose, UA: NEGATIVE
Ketones, UA: NEGATIVE
NITRITE UA: NEGATIVE
Protein, UA: NEGATIVE
Specific Gravity, UA: 1.02 (ref 1.005–1.030)
UUROB: 0.2 mg/dL (ref 0.2–1.0)
pH, UA: 5.5 (ref 5.0–7.5)

## 2014-09-29 NOTE — Progress Notes (Signed)
09/29/2014 3:55 PM   Abigail Walker May 18, 1945 [id number]  Referring provider: Kirk Ruths, MD [place], [place] [address]  Chief Complaint  Patient presents with  . Vaginitis    HPI: Patient is a 69 year old white female who presents today for reexamination after being placed on vaginal estrogen cream for atrophic vaginitis.  Patient had not used the cream because she was confused about why it was prescribed.   She was upset because she felt that she was being referred to urology to have a surgical procedure on her urethra. She states she felt that the urethra was starting to be blocked by introital stenosis.  She states she still having a splaying of the urinary stream. She is also wearing pads for protection in case she has some urge or stress incontinence if she has to delay voiding. She denies any dysuria or hematuria. She denies any suprapubic pain or back pain.  PMH: Past Medical History  Diagnosis Date  . Diabetes mellitus without complication   . HBV (hepatitis B virus) infection   . High blood pressure   . Hives     food related  . Sleep apnea   . Thyroid disease   . Hypothyroidism   . Depression   . Chronic kidney disease   . GERD (gastroesophageal reflux disease)   . Cancer skin    Surgical History: Past Surgical History  Procedure Laterality Date  . Tonsillectomy    . Tubal ligation    . Hemorrhoid surgery    . Implantable contact lens implantation      Home Medications:    Medication List       This list is accurate as of: 09/29/14  3:55 PM.  Always use your most recent med list.               acetaminophen 500 MG tablet  Commonly known as:  TYLENOL  Take 500 mg by mouth every 6 (six) hours as needed for moderate pain.     amLODipine 5 MG tablet  Commonly known as:  NORVASC  TAKE ONE TABLET BY MOUTH ONCE DAILY     azelastine 0.1 % nasal spray  Commonly known as:  ASTELIN  Place into the nose.     buPROPion 150  MG 24 hr tablet  Commonly known as:  WELLBUTRIN XL  Take 3 tablet po QD as directed     clobetasol cream 0.05 %  Commonly known as:  TEMOVATE  Apply topically.     clotrimazole 10 MG troche  Commonly known as:  MYCELEX  Take 10 mg by mouth 5 (five) times daily.     estradiol 0.1 MG/GM vaginal cream  Commonly known as:  ESTRACE  Place 1 Applicatorful vaginally 3 (three) times a week.     fluticasone 50 MCG/ACT nasal spray  Commonly known as:  FLONASE  Place into the nose.     Insulin Pen Needle 31G X 5 MM Misc  by Does not apply route.     INSULIN SYRINGE 1CC/31GX5/16" 31G X 5/16" 1 ML Misc  USE ONE SYRINGE TWICE DAILY AS DIRECTED WITH INSULIN VIALS     levothyroxine 75 MCG tablet  Commonly known as:  SYNTHROID, LEVOTHROID  TAKE ONE TABLET BY MOUTH ONCE DAILY     lisinopril 20 MG tablet  Commonly known as:  PRINIVIL,ZESTRIL  TAKE ONE TABLET BY MOUTH ONCE DAILY     NOVOLIN 70/30 (70-30) 100 UNIT/ML injection  Generic drug:  insulin NPH-regular Human  Inject 16 units before breakfast and 26 units before supper as directed     nystatin cream  Commonly known as:  MYCOSTATIN     pantoprazole 40 MG tablet  Commonly known as:  PROTONIX  Take by mouth.     PARoxetine 40 MG tablet  Commonly known as:  PAXIL  Take by mouth.     pimecrolimus 1 % cream  Commonly known as:  ELIDEL  Apply topically 2 (two) times daily.     pravastatin 80 MG tablet  Commonly known as:  PRAVACHOL  TAKE ONE TABLET BY MOUTH ONCE DAILY     quinapril 20 MG tablet  Commonly known as:  ACCUPRIL  Take 20 mg by mouth at bedtime.     tacrolimus 1 MG capsule  Commonly known as:  PROGRAF  Take by mouth.     torsemide 10 MG tablet  Commonly known as:  DEMADEX  Take by mouth.     Vitamin D3 2000 UNITS capsule  Take by mouth.        Allergies:  Allergies  Allergen Reactions  . Sulfa Antibiotics Nausea And Vomiting  . Amoxicillin-Pot Clavulanate Rash    Family History: Family  History  Problem Relation Age of Onset  . Heart disease Father   . Cancer Mother   . Kidney failure Maternal Aunt     Social History:  reports that she quit smoking about 21 years ago. She does not have any smokeless tobacco history on file. She reports that she does not drink alcohol or use illicit drugs.  ROS: UROLOGY Frequent Urination?: No Hard to postpone urination?: Yes Burning/pain with urination?: No Get up at night to urinate?: No Leakage of urine?: Yes Urine stream starts and stops?: No Trouble starting stream?: No Do you have to strain to urinate?: No Blood in urine?: No Urinary tract infection?: No Sexually transmitted disease?: No Injury to kidneys or bladder?: No Painful intercourse?: No Weak stream?: No Currently pregnant?: No Vaginal bleeding?: No Last menstrual period?: n  Gastrointestinal Nausea?: No Vomiting?: No Indigestion/heartburn?: Yes Diarrhea?: No Constipation?: Yes  Constitutional Fever: No Night sweats?: No Weight loss?: No Fatigue?: No  Skin Skin rash/lesions?: No Itching?: No  Eyes Blurred vision?: No Double vision?: Yes  Ears/Nose/Throat Sore throat?: No Sinus problems?: Yes  Hematologic/Lymphatic Swollen glands?: No Easy bruising?: Yes  Cardiovascular Leg swelling?: No Chest pain?: No  Respiratory Cough?: No Shortness of breath?: Yes  Endocrine Excessive thirst?: No  Musculoskeletal Back pain?: Yes Joint pain?: Yes  Neurological Headaches?: Yes Dizziness?: No  Psychologic Depression?: Yes Anxiety?: Yes  Physical Exam: BP 138/66 mmHg  Pulse 84  Resp 16  Ht 5' 1.5" (1.562 m)  Wt 187 lb 3.2 oz (84.913 kg)  BMI 34.80 kg/m2  GU:  Atrophic external genitalia.  Normal urethral meatus. No urethral masses and/or tenderness. No bladder fullness or masses. No vaginal lesions or discharge.  The introitus had about a quarter size opening with fusion of the labia at the base of the introitus.  Normal rectal  tone, no masses. Normal anus and perineum.   Laboratory Data: Lab Results  Component Value Date   WBC 4.3 08/14/2014   HGB 12.1 08/14/2014   HCT 36.8 08/14/2014   MCV 87.2 08/14/2014   PLT 186 08/14/2014    Lab Results  Component Value Date   CREATININE 1.18 05/19/2011   Urinalysis Results for orders placed or performed in visit on 09/29/14  Microscopic Examination  Result Value  Ref Range   WBC, UA 0-5 0 -  5 /hpf   RBC, UA None seen 0 -  2 /hpf   Epithelial Cells (non renal) 0-10 0 - 10 /hpf   Mucus, UA Present (A) Not Estab.   Bacteria, UA None seen None seen/Few  Urinalysis, Complete  Result Value Ref Range   Specific Gravity, UA 1.020 1.005 - 1.030   pH, UA 5.5 5.0 - 7.5   Color, UA Yellow Yellow   Appearance Ur Clear Clear   Leukocytes, UA Trace (A) Negative   Protein, UA Negative Negative/Trace   Glucose, UA Negative Negative   Ketones, UA Negative Negative   RBC, UA Trace (A) Negative   Bilirubin, UA Negative Negative   Urobilinogen, Ur 0.2 0.2 - 1.0 mg/dL   Nitrite, UA Negative Negative   Microscopic Examination See below:    Assessment & Plan:    1. Atrophic vaginitis:   I explained to the patient the purpose of the vaginal estrogen cream.  I have described the condition of atrophic vaginitis and the effect it would have on the patient's urination symptoms and in preventing infections in the future.  I stated to her that I did not see a flap of skin over the urethral meatus or any new airway of the meatus. I did explain that the estrogen cream would make the vaginal tissue more supple and the splaying of the urinary stream would cease.  She agrees to start using the vaginal cream and she will return in 1 month for symptom recheck and vaginal exam.  - Urinalysis, Complete   No Follow-up on file.  Zara Council, [facility] [address] [place], [place] [address] [phone]

## 2014-10-23 DIAGNOSIS — N2581 Secondary hyperparathyroidism of renal origin: Secondary | ICD-10-CM | POA: Diagnosis not present

## 2014-10-23 DIAGNOSIS — D631 Anemia in chronic kidney disease: Secondary | ICD-10-CM | POA: Diagnosis not present

## 2014-10-23 DIAGNOSIS — R809 Proteinuria, unspecified: Secondary | ICD-10-CM | POA: Diagnosis not present

## 2014-10-23 DIAGNOSIS — N183 Chronic kidney disease, stage 3 (moderate): Secondary | ICD-10-CM | POA: Diagnosis not present

## 2014-10-23 DIAGNOSIS — I10 Essential (primary) hypertension: Secondary | ICD-10-CM | POA: Diagnosis not present

## 2014-10-28 DIAGNOSIS — N183 Chronic kidney disease, stage 3 (moderate): Secondary | ICD-10-CM | POA: Diagnosis not present

## 2014-10-28 DIAGNOSIS — I10 Essential (primary) hypertension: Secondary | ICD-10-CM | POA: Diagnosis not present

## 2014-10-28 DIAGNOSIS — E1122 Type 2 diabetes mellitus with diabetic chronic kidney disease: Secondary | ICD-10-CM | POA: Diagnosis not present

## 2014-10-28 DIAGNOSIS — N2581 Secondary hyperparathyroidism of renal origin: Secondary | ICD-10-CM | POA: Diagnosis not present

## 2014-11-03 DIAGNOSIS — L439 Lichen planus, unspecified: Secondary | ICD-10-CM | POA: Diagnosis not present

## 2014-11-03 DIAGNOSIS — R208 Other disturbances of skin sensation: Secondary | ICD-10-CM | POA: Diagnosis not present

## 2014-11-06 ENCOUNTER — Telehealth: Payer: Self-pay | Admitting: Urology

## 2014-11-06 ENCOUNTER — Ambulatory Visit (INDEPENDENT_AMBULATORY_CARE_PROVIDER_SITE_OTHER): Payer: Medicare Other | Admitting: Urology

## 2014-11-06 ENCOUNTER — Encounter: Payer: Self-pay | Admitting: Urology

## 2014-11-06 VITALS — BP 107/67 | HR 99 | Ht 61.5 in | Wt 193.2 lb

## 2014-11-06 DIAGNOSIS — N952 Postmenopausal atrophic vaginitis: Secondary | ICD-10-CM

## 2014-11-06 DIAGNOSIS — N3946 Mixed incontinence: Secondary | ICD-10-CM

## 2014-11-06 NOTE — Telephone Encounter (Signed)
Would you see if her insurance will cover PTNS?

## 2014-11-06 NOTE — Progress Notes (Signed)
4:04 PM   Abigail Walker 11-Aug-1945 [id number]  Referring provider: Kirk Ruths, MD [place] [facility] [place], [place] [address]  Chief Complaint  Patient presents with  . Vaginitis    one month follow up    HPI: Patient is a 69 year old white female who presents today for reexamination after being placed on vaginal estrogen cream for atrophic vaginitis.  She states she has not noticed much difference since using the vaginal estrogen cream for the last month.   It is not irritating to her when she applies it in the clitoral area,  but she cannot apply it to the labia because it will burn.    She states she still having a splaying of the urinary stream. She is also wearing pads for protection in case she has some urge or stress incontinence if she has to delay voiding. She denies any dysuria or hematuria. She denies any suprapubic pain or back pain.  PMH: Past Medical History  Diagnosis Date  . Diabetes mellitus without complication ([place])   . HBV (hepatitis B virus) infection   . High blood pressure   . Hives     food related  . Sleep apnea   . Thyroid disease   . Hypothyroidism   . Depression   . Chronic kidney disease   . GERD (gastroesophageal reflux disease)   . Cancer [facility]) skin    Surgical History: Past Surgical History  Procedure Laterality Date  . Tonsillectomy    . Tubal ligation    . Hemorrhoid surgery    . Implantable contact lens implantation      Home Medications:    Medication List       This list is accurate as of: 11/06/14  4:04 PM.  Always use your most recent med list.               acetaminophen 500 MG tablet  Commonly known as:  TYLENOL  Take 500 mg by mouth every 6 (six) hours as needed for moderate pain.     amLODipine 5 MG tablet  Commonly known as:  NORVASC  TAKE ONE TABLET BY MOUTH ONCE DAILY     azelastine 0.1 % nasal spray  Commonly known as:  ASTELIN  Place into the nose.     buPROPion  150 MG 24 hr tablet  Commonly known as:  WELLBUTRIN XL  Take 3 tablet po QD as directed     calcitRIOL 0.25 MCG capsule  Commonly known as:  ROCALTROL     clobetasol cream 0.05 %  Commonly known as:  TEMOVATE  Apply topically.     clotrimazole 10 MG troche  Commonly known as:  MYCELEX  Take 10 mg by mouth 5 (five) times daily.     estradiol 0.1 MG/GM vaginal cream  Commonly known as:  ESTRACE  Place 1 Applicatorful vaginally 3 (three) times a week.     fluticasone 50 MCG/ACT nasal spray  Commonly known as:  FLONASE  Place into the nose.     Insulin Pen Needle 31G X 5 MM Misc  by Does not apply route.     INSULIN SYRINGE 1CC/31GX5/16" 31G X 5/16" 1 ML Misc  USE ONE SYRINGE TWICE DAILY AS DIRECTED WITH INSULIN VIALS     levothyroxine 75 MCG tablet  Commonly known as:  SYNTHROID, LEVOTHROID  TAKE ONE TABLET BY MOUTH ONCE DAILY     lisinopril 20 MG tablet  Commonly known as:  PRINIVIL,ZESTRIL  TAKE ONE TABLET BY MOUTH ONCE DAILY     NOVOLIN 70/30 (70-30) 100 UNIT/ML injection  Generic drug:  insulin NPH-regular Human  Inject 16 units before breakfast and 26 units before supper as directed     nystatin cream  Commonly known as:  MYCOSTATIN     pantoprazole 40 MG tablet  Commonly known as:  PROTONIX  Take by mouth.     PARoxetine 40 MG tablet  Commonly known as:  PAXIL  Take by mouth.     pimecrolimus 1 % cream  Commonly known as:  ELIDEL  Apply topically 2 (two) times daily.     pravastatin 80 MG tablet  Commonly known as:  PRAVACHOL  TAKE ONE TABLET BY MOUTH ONCE DAILY     quinapril 20 MG tablet  Commonly known as:  ACCUPRIL  Take 20 mg by mouth at bedtime.     tacrolimus 1 MG capsule  Commonly known as:  PROGRAF  Take by mouth.     torsemide 10 MG tablet  Commonly known as:  DEMADEX  Take by mouth.     Vitamin D3 2000 UNITS capsule  Take by mouth.        Allergies:  Allergies  Allergen Reactions  . Sulfa Antibiotics Nausea And Vomiting  .  Amoxicillin-Pot Clavulanate Rash    Family History: Family History  Problem Relation Age of Onset  . Heart disease Father   . Cancer Mother   . Kidney failure Maternal Aunt     Social History:  reports that she quit smoking about 21 years ago. She does not have any smokeless tobacco history on file. She reports that she does not drink alcohol or use illicit drugs.  ROS: UROLOGY Frequent Urination?: No Hard to postpone urination?: Yes Burning/pain with urination?: No Get up at night to urinate?: Yes Leakage of urine?: No Urine stream starts and stops?: No Trouble starting stream?: No Do you have to strain to urinate?: No Blood in urine?: No Urinary tract infection?: No Sexually transmitted disease?: No Injury to kidneys or bladder?: No Painful intercourse?: No Weak stream?: No Currently pregnant?: No Vaginal bleeding?: No Last menstrual period?: n  Gastrointestinal Nausea?: No Vomiting?: No Indigestion/heartburn?: No Diarrhea?: No Constipation?: Yes  Constitutional Fever: No Night sweats?: No Weight loss?: No Fatigue?: Yes  Skin Skin rash/lesions?: No Itching?: No  Eyes Blurred vision?: No Double vision?: Yes  Ears/Nose/Throat Sore throat?: No Sinus problems?: No  Hematologic/Lymphatic Swollen glands?: No Easy bruising?: Yes  Cardiovascular Leg swelling?: No Chest pain?: No  Respiratory Cough?: No Shortness of breath?: Yes  Endocrine Excessive thirst?: No  Musculoskeletal Back pain?: No Joint pain?: No  Neurological Headaches?: No Dizziness?: No  Psychologic Depression?: Yes Anxiety?: Yes  Physical Exam: BP 107/67 mmHg  Pulse 99  Ht 5' 1.5" (1.562 m)  Wt 193 lb 3.2 oz (87.635 kg)  BMI 35.92 kg/m2  GU:  Atrophic external genitalia.  Normal urethral meatus. No urethral masses and/or tenderness. No bladder fullness or masses. No vaginal lesions or discharge.  The introitus had about a quarter size opening with fusion of the labia  at the base of the introitus.  The labia are red and tender to palpation. Normal rectal tone, no masses. Normal anus and perineum.   Laboratory Data: Lab Results  Component Value Date   WBC 4.3 08/14/2014   HGB 12.1 08/14/2014   HCT 36.8 08/14/2014   MCV 87.2 08/14/2014   PLT 186 08/14/2014     Assessment &  Plan:    1. Atrophic vaginitis:   She will continue the vaginal estrogen cream 3 nights weekly. She will return in 3 months for a vaginal exam.  - Urinalysis, Complete  2. Mixed incontinence:   I discussed with the patient starting an OAB medication or PTNS.  She did not want to add any more medication to her current list, so she would like to try the PTNS therapy.  We will check with her insurance to make sure it is a covered procedure. If it is covered she would like to proceed with the treatments.   Return in about 3 months (around 02/06/2015) for exam .  Zara Council, [facility]  [facility] [address] [place], [place] [address] [phone]

## 2014-11-09 DIAGNOSIS — N3946 Mixed incontinence: Secondary | ICD-10-CM | POA: Insufficient documentation

## 2014-11-17 ENCOUNTER — Telehealth: Payer: Self-pay | Admitting: *Deleted

## 2014-11-17 NOTE — Telephone Encounter (Signed)
Patients insurance will cover PTNS. Let Abigail Walker know patient will be called and set up for PTNS.

## 2014-11-17 NOTE — Telephone Encounter (Signed)
Will insurance cover her PTNS?

## 2014-11-17 NOTE — Telephone Encounter (Signed)
LMOM for patient to call back and talk to me regarding PTNS appointments. Patient's insurance will cover it.

## 2014-11-19 NOTE — Telephone Encounter (Signed)
Patient called back and we set up her first 6 PTNS  Appointments. I will give her a printed schedule when patient comes in on the 9th for her first treatment. She chose Wednesdays at 3:15.

## 2014-11-19 NOTE — Telephone Encounter (Signed)
LMOM for patient to return call about getting her PTNS treatments scheduled and started.

## 2014-11-20 DIAGNOSIS — N182 Chronic kidney disease, stage 2 (mild): Secondary | ICD-10-CM | POA: Diagnosis not present

## 2014-11-20 DIAGNOSIS — E1122 Type 2 diabetes mellitus with diabetic chronic kidney disease: Secondary | ICD-10-CM | POA: Diagnosis not present

## 2014-11-20 DIAGNOSIS — E1165 Type 2 diabetes mellitus with hyperglycemia: Secondary | ICD-10-CM | POA: Diagnosis not present

## 2014-11-20 DIAGNOSIS — E785 Hyperlipidemia, unspecified: Secondary | ICD-10-CM | POA: Diagnosis not present

## 2014-11-20 DIAGNOSIS — I10 Essential (primary) hypertension: Secondary | ICD-10-CM | POA: Diagnosis not present

## 2014-11-20 DIAGNOSIS — E1169 Type 2 diabetes mellitus with other specified complication: Secondary | ICD-10-CM | POA: Diagnosis not present

## 2014-11-26 ENCOUNTER — Ambulatory Visit (INDEPENDENT_AMBULATORY_CARE_PROVIDER_SITE_OTHER): Payer: Medicare Other | Admitting: Urology

## 2014-11-26 ENCOUNTER — Encounter: Payer: Self-pay | Admitting: Urology

## 2014-11-26 VITALS — BP 137/68 | HR 102 | Ht 62.0 in | Wt 192.5 lb

## 2014-11-26 DIAGNOSIS — N3946 Mixed incontinence: Secondary | ICD-10-CM | POA: Diagnosis not present

## 2014-11-26 LAB — PTNS-PERCUTANEOUS TIBIAL NERVE STIMULATION: SCAN RESULT: 12

## 2014-11-26 NOTE — Progress Notes (Signed)
Chief Complaint:  Chief Complaint  Patient presents with  . Mixed Incontinence    PTNS     HPI: Patient is a 69 year old white female who presents today for her first PTNS.    Background story She states she still having a splaying of the urinary stream. She is also wearing pads for protection in case she has some urge or stress incontinence if she has to delay voiding. She denies any dysuria or hematuria. She denies any suprapubic pain or back pain.  She states her symptoms began several months ago and they are not related to any specific event.  Her leakage occurs if she has to delay voiding.  The leakage amount varies.  She will lose urine with coughing, sneezing, or laughing.   She is leaking up to one time a day.  She is having daytime frequency of 6 voids, 2 night time voids and no pain with urination.  She has a perception of the need to urinate, but she sometimes cannot get to the bathroom on time.  She is wearing Poise pads and changes them once daily.    Fluid intake # 6 of cups of fluid per day, # 6 of cups of caffeine beverages per day and  # 0 alcohol intake per day.        Previous Therapy:  She is not interested in trying any medications.  BP 137/68 mmHg  Pulse 102  Ht 5\' 2"  (1.575 m)  Wt 192 lb 8 oz (87.317 kg)  BMI 35.20 kg/m2   She has no contraindications present for PTNS, such as: pacemaker, implantable defibrillator, history of abnormal bleeding and a history of neuropathies or nerve damage.  I discussed with patient possible complications of procedure, such as discomfort, bleeding at insertion/stimulation site, procedure consent signed  Patient goals: Her goals are to have no accidents.  PTNS treatment: The needle electrode was inserted into the lower, inner aspect of the patient's left leg. The surface electrode was placed on the inside arch of the foot on the treatment leg. The lead set was connected to the stimulator, and the needle electrode clip was  connected to the needle electrode. The stimulator that produces an adjustable electrical pulse that travels to sacral nerve plexus via the tibial nerve was increased to 12 and the patient receiveda toe flex and a sensory response.   Treatment Plan:  Patient tolerated the PTNS treatment for 30 minutes without difficulty. The electrode was removed from her leg without difficulty. She will return next week for her 2nd PTNS treatment.

## 2014-12-02 DIAGNOSIS — Z23 Encounter for immunization: Secondary | ICD-10-CM | POA: Diagnosis not present

## 2014-12-03 ENCOUNTER — Encounter: Payer: Self-pay | Admitting: Urology

## 2014-12-03 ENCOUNTER — Ambulatory Visit (INDEPENDENT_AMBULATORY_CARE_PROVIDER_SITE_OTHER): Payer: Medicare Other | Admitting: Urology

## 2014-12-03 VITALS — BP 149/70 | HR 98 | Ht 61.5 in | Wt 194.1 lb

## 2014-12-03 DIAGNOSIS — N3946 Mixed incontinence: Secondary | ICD-10-CM

## 2014-12-03 LAB — PTNS-PERCUTANEOUS TIBIAL NERVE STIMULATION: SCAN RESULT: 8

## 2014-12-05 NOTE — Progress Notes (Signed)
Chief Complaint:  Chief Complaint  Patient presents with  . Mixed Incontinence    PTNS     HPI: Patient is a 69 year old white female who presents today for her second PTNS.    Background story She states she still having a splaying of the urinary stream. She is also wearing pads for protection in case she has some urge or stress incontinence if she has to delay voiding. She denies any dysuria or hematuria. She denies any suprapubic pain or back pain.  She states her symptoms began several months ago and they are not related to any specific event.  Her leakage occurs if she has to delay voiding.  The leakage amount varies.  She will lose urine with coughing, sneezing, or laughing.     After her last treatment, she is leaking up to one time a day (unchanged).  She is having daytime frequency of 6 voids ( unchanged), 1 night time voids (improvement) and no pain with urination.  She has a perception of the need to urinate, but she sometimes cannot get to the bathroom on time.  She is wearing Poise pads and changes them once daily.    Fluid intake # 6 of cups of fluid per day, # 6 of cups of caffeine beverages per day and  # 0 alcohol intake per day.        Previous Therapy:  She is not interested in trying any medications.  Blood pressure 149/70, pulse 98, height 5' 1.5" (1.562 m), weight 194 lb 1.6 oz (88.043 kg).  She has no contraindications present for PTNS, such as: pacemaker, implantable defibrillator, history of abnormal bleeding and a history of neuropathies or nerve damage.  I discussed with patient possible complications of procedure, such as discomfort, bleeding at insertion/stimulation site, procedure consent signed  Patient goals: Her goals are to have no accidents.  PTNS treatment: The needle electrode was inserted into the lower, inner aspect of the patient's left leg. The surface electrode was placed on the inside arch of the foot on the treatment leg. The lead set was  connected to the stimulator, and the needle electrode clip was connected to the needle electrode. The stimulator that produces an adjustable electrical pulse that travels to sacral nerve plexus via the tibial nerve was increased to 8 and the patient received a sensory response.   Treatment Plan:  Patient tolerated the PTNS treatment for 30 minutes without difficulty. The electrode was removed from her leg without difficulty. She will return next week for her 3rd PTNS treatment.

## 2014-12-10 ENCOUNTER — Encounter: Payer: Self-pay | Admitting: Urology

## 2014-12-10 ENCOUNTER — Ambulatory Visit (INDEPENDENT_AMBULATORY_CARE_PROVIDER_SITE_OTHER): Payer: Medicare Other | Admitting: Urology

## 2014-12-10 VITALS — BP 111/66 | HR 98 | Ht 61.0 in | Wt 194.2 lb

## 2014-12-10 DIAGNOSIS — F325 Major depressive disorder, single episode, in full remission: Secondary | ICD-10-CM | POA: Diagnosis not present

## 2014-12-10 DIAGNOSIS — N183 Chronic kidney disease, stage 3 (moderate): Secondary | ICD-10-CM | POA: Diagnosis not present

## 2014-12-10 DIAGNOSIS — I10 Essential (primary) hypertension: Secondary | ICD-10-CM | POA: Diagnosis not present

## 2014-12-10 DIAGNOSIS — N3946 Mixed incontinence: Secondary | ICD-10-CM | POA: Diagnosis not present

## 2014-12-10 DIAGNOSIS — Z794 Long term (current) use of insulin: Secondary | ICD-10-CM | POA: Diagnosis not present

## 2014-12-10 DIAGNOSIS — Z6835 Body mass index (BMI) 35.0-35.9, adult: Secondary | ICD-10-CM | POA: Diagnosis not present

## 2014-12-10 DIAGNOSIS — E1169 Type 2 diabetes mellitus with other specified complication: Secondary | ICD-10-CM | POA: Diagnosis not present

## 2014-12-10 DIAGNOSIS — E1122 Type 2 diabetes mellitus with diabetic chronic kidney disease: Secondary | ICD-10-CM | POA: Diagnosis not present

## 2014-12-10 DIAGNOSIS — E039 Hypothyroidism, unspecified: Secondary | ICD-10-CM | POA: Diagnosis not present

## 2014-12-10 DIAGNOSIS — E785 Hyperlipidemia, unspecified: Secondary | ICD-10-CM | POA: Diagnosis not present

## 2014-12-10 LAB — PTNS-PERCUTANEOUS TIBIAL NERVE STIMULATION: Scan Result: 11

## 2014-12-10 NOTE — Progress Notes (Signed)
Chief Complaint:  Chief Complaint  Patient presents with  . Mixed Incontinence    PTNS     HPI: Patient is a 69 year old white female who presents today for her third PTNS.    Background story She states she still having a splaying of the urinary stream. She is also wearing pads for protection in case she has some urge or stress incontinence if she has to delay voiding. She denies any dysuria or hematuria. She denies any suprapubic pain or back pain.  She states her symptoms began several months ago and they are not related to any specific event.  Her leakage occurs if she has to delay voiding.  The leakage amount varies.  She will lose urine with coughing, sneezing, or laughing.     After her last treatment, she is leaking up to one time a day (unchanged).  She is having daytime frequency of 5 voids ( improved), 1 night time voids (improvement) and no pain with urination.  She has a perception of the need to urinate, but she sometimes cannot get to the bathroom on time.  She is wearing Poise pads and changes them once daily.    Fluid intake # 6 of cups of fluid per day, # 6 of cups of caffeine beverages per day and  # 0 alcohol intake per day.        Previous Therapy:  She is not interested in trying any medications.  Blood pressure 111/66, pulse 98, height 5\' 1"  (1.549 m), weight 194 lb 3.2 oz (88.089 kg).  She has no contraindications present for PTNS, such as: pacemaker, implantable defibrillator, history of abnormal bleeding and a history of neuropathies or nerve damage.  I discussed with patient possible complications of procedure, such as discomfort, bleeding at insertion/stimulation site, procedure consent signed  Patient goals: Her goals are to have no accidents.  PTNS treatment: The needle electrode was inserted into the lower, inner aspect of the patient's left leg. The surface electrode was placed on the inside arch of the foot on the treatment leg. The lead set was  connected to the stimulator, and the needle electrode clip was connected to the needle electrode. The stimulator that produces an adjustable electrical pulse that travels to sacral nerve plexus via the tibial nerve was increased to 11 and the patient received a sensory response.   Treatment Plan:  Patient tolerated the PTNS treatment for 30 minutes without difficulty. The electrode was removed from her leg without difficulty. She will return next week for her 4th PTNS treatment.

## 2014-12-17 ENCOUNTER — Ambulatory Visit (INDEPENDENT_AMBULATORY_CARE_PROVIDER_SITE_OTHER): Payer: Medicare Other | Admitting: Urology

## 2014-12-17 ENCOUNTER — Encounter: Payer: Self-pay | Admitting: Urology

## 2014-12-17 VITALS — BP 128/56 | HR 102 | Ht 61.0 in | Wt 194.1 lb

## 2014-12-17 DIAGNOSIS — N3946 Mixed incontinence: Secondary | ICD-10-CM

## 2014-12-17 LAB — PTNS-PERCUTANEOUS TIBIAL NERVE STIMULATION: Scan Result: 6

## 2014-12-17 NOTE — Progress Notes (Signed)
Chief Complaint:  Chief Complaint  Patient presents with  . Mixed Incontinence    PTNS     HPI: Patient is a 69 year old white female who presents today for her fourth PTNS.    Background story She states she still having a splaying of the urinary stream. She is also wearing pads for protection in case she has some urge or stress incontinence if she has to delay voiding. She denies any dysuria or hematuria. She denies any suprapubic pain or back pain.  She states her symptoms began several months ago and they are not related to any specific event.  Her leakage occurs if she has to delay voiding.  The leakage amount varies.  She will lose urine with coughing, sneezing, or laughing.     After her last treatment, she is leaking up to one time a day (unchanged).  She is having daytime frequency of 5 voids (unchanged), 1 night time voids (unchanged) and no pain with urination.  She has a perception of the need to urinate, but she sometimes cannot get to the bathroom on time.  She is wearing Poise pads and changes them once daily.    Fluid intake # 6 of cups of fluid per day, # 6 of cups of caffeine beverages per day and  # 0 alcohol intake per day.        Previous Therapy:  She is not interested in trying any medications.  Blood pressure 128/56, pulse 102, height 5\' 1"  (1.549 m), weight 194 lb 1.6 oz (88.043 kg).  She has no contraindications present for PTNS, such as: pacemaker, implantable defibrillator, history of abnormal bleeding and a history of neuropathies or nerve damage.  I discussed with patient possible complications of procedure, such as discomfort, bleeding at insertion/stimulation site, procedure consent signed  Patient goals: Her goals are to have no accidents.  PTNS treatment: The needle electrode was inserted into the lower, inner aspect of the patient's right  leg. The surface electrode was placed on the inside arch of the foot on the treatment leg. The lead set was  connected to the stimulator, and the needle electrode clip was connected to the needle electrode. The stimulator that produces an adjustable electrical pulse that travels to sacral nerve plexus via the tibial nerve was increased to 6 and the patient received a sensory response.   Treatment Plan:  Patient tolerated the PTNS treatment for 30 minutes without difficulty. The electrode was removed from her leg without difficulty. She will return next week for her 5th PTNS treatment.

## 2014-12-18 DIAGNOSIS — Z9189 Other specified personal risk factors, not elsewhere classified: Secondary | ICD-10-CM | POA: Diagnosis not present

## 2014-12-18 DIAGNOSIS — E1122 Type 2 diabetes mellitus with diabetic chronic kidney disease: Secondary | ICD-10-CM | POA: Diagnosis not present

## 2014-12-18 DIAGNOSIS — E1165 Type 2 diabetes mellitus with hyperglycemia: Secondary | ICD-10-CM | POA: Diagnosis not present

## 2014-12-18 DIAGNOSIS — E11649 Type 2 diabetes mellitus with hypoglycemia without coma: Secondary | ICD-10-CM | POA: Diagnosis not present

## 2014-12-18 DIAGNOSIS — E039 Hypothyroidism, unspecified: Secondary | ICD-10-CM | POA: Diagnosis not present

## 2014-12-18 DIAGNOSIS — N183 Chronic kidney disease, stage 3 (moderate): Secondary | ICD-10-CM | POA: Diagnosis not present

## 2014-12-18 DIAGNOSIS — Z794 Long term (current) use of insulin: Secondary | ICD-10-CM | POA: Diagnosis not present

## 2014-12-24 ENCOUNTER — Encounter: Payer: Self-pay | Admitting: Urology

## 2014-12-24 ENCOUNTER — Ambulatory Visit (INDEPENDENT_AMBULATORY_CARE_PROVIDER_SITE_OTHER): Payer: Medicare Other | Admitting: Urology

## 2014-12-24 VITALS — BP 123/67 | HR 106 | Ht 61.0 in | Wt 191.9 lb

## 2014-12-24 DIAGNOSIS — N3946 Mixed incontinence: Secondary | ICD-10-CM

## 2014-12-24 LAB — PTNS-PERCUTANEOUS TIBIAL NERVE STIMULATION: Scan Result: 11

## 2014-12-25 NOTE — Progress Notes (Signed)
Chief Complaint:  Chief Complaint  Patient presents with  . Mixed Incontinence    PTNS     HPI: Patient is a 69 year old white female who presents today for her fifth PTNS.    Background story She states she still having a splaying of the urinary stream. She is also wearing pads for protection in case she has some urge or stress incontinence if she has to delay voiding. She denies any dysuria or hematuria. She denies any suprapubic pain or back pain.  She states her symptoms began several months ago and they are not related to any specific event.  Her leakage occurs if she has to delay voiding.  The leakage amount varies.  She will lose urine with coughing, sneezing, or laughing.     After her last treatment, she is leaking up to one time a day (unchanged).  She is having daytime frequency of 6 voids (worse), 1 night time voids (unchanged) and no pain with urination.  She has a perception of the need to urinate, but she sometimes cannot get to the bathroom on time.  She is wearing Poise pads and changes them once daily.    Fluid intake # 6 of cups of fluid per day, # 6 of cups of caffeine beverages per day and  # 0 alcohol intake per day.        Previous Therapy:  She is not interested in trying any medications.  Blood pressure 123/67, pulse 106, height 5\' 1"  (1.549 m), weight 191 lb 14.4 oz (87.045 kg).  She has no contraindications present for PTNS, such as: pacemaker, implantable defibrillator, history of abnormal bleeding and a history of neuropathies or nerve damage.  I discussed with patient possible complications of procedure, such as discomfort, bleeding at insertion/stimulation site, procedure consent signed  Patient goals: Her goals are to have no accidents.  PTNS treatment: The needle electrode was inserted into the lower, inner aspect of the patient's left  leg. The surface electrode was placed on the inside arch of the foot on the treatment leg. The lead set was  connected to the stimulator, and the needle electrode clip was connected to the needle electrode. The stimulator that produces an adjustable electrical pulse that travels to sacral nerve plexus via the tibial nerve was increased to 11 and the patient received a sensory response and a toe response.    Treatment Plan:  Patient tolerated the PTNS treatment for 30 minutes without difficulty. The electrode was removed from her leg without difficulty. She will return next week for her 6th PTNS treatment.

## 2014-12-31 ENCOUNTER — Ambulatory Visit (INDEPENDENT_AMBULATORY_CARE_PROVIDER_SITE_OTHER): Payer: Medicare Other | Admitting: Urology

## 2014-12-31 ENCOUNTER — Encounter: Payer: Self-pay | Admitting: Urology

## 2014-12-31 VITALS — BP 128/63 | HR 101 | Ht 61.0 in | Wt 192.3 lb

## 2014-12-31 DIAGNOSIS — N3946 Mixed incontinence: Secondary | ICD-10-CM

## 2014-12-31 LAB — PTNS-PERCUTANEOUS TIBIAL NERVE STIMULATION: Scan Result: 10

## 2014-12-31 NOTE — Progress Notes (Signed)
Chief Complaint:  Chief Complaint  Patient presents with  . Mixed incontinence    PTNS     HPI: Patient is a 69 year old white female who presents today for her sixth PTNS.    Background story She states she still having a splaying of the urinary stream. She is also wearing pads for protection in case she has some urge or stress incontinence if she has to delay voiding. She denies any dysuria or hematuria. She denies any suprapubic pain or back pain.  She states her symptoms began several months ago and they are not related to any specific event.  Her leakage occurs if she has to delay voiding.  The leakage amount varies.  She will lose urine with coughing, sneezing, or laughing.     After her last treatment, she is leaking up to one time a day (unchanged).  She is having daytime frequency of 5 voids (improved), 1 night time voids (unchanged) and no pain with urination.  She has a perception of the need to urinate, but she sometimes cannot get to the bathroom on time.  She is wearing Poise pads and changes them once daily.    Fluid intake # 6 of cups of fluid per day, # 6 of cups of caffeine beverages per day and  # 0 alcohol intake per day.        Previous Therapy:  She is not interested in trying any medications.  Blood pressure 128/63, pulse 101, height 5\' 1"  (1.549 m), weight 192 lb 4.8 oz (87.227 kg).  She has no contraindications present for PTNS, such as: pacemaker, implantable defibrillator, history of abnormal bleeding and a history of neuropathies or nerve damage.  I discussed with patient possible complications of procedure, such as discomfort, bleeding at insertion/stimulation site, procedure consent signed  Patient goals: Her goals are to have no accidents.  PTNS treatment: The needle electrode was inserted into the lower, inner aspect of the patient's left  leg. The surface electrode was placed on the inside arch of the foot on the treatment leg. The lead set was  connected to the stimulator, and the needle electrode clip was connected to the needle electrode. The stimulator that produces an adjustable electrical pulse that travels to sacral nerve plexus via the tibial nerve was increased to 10 and the patient received a sensory response and a toe response.    Treatment Plan:  Patient tolerated the PTNS treatment for 30 minutes without difficulty. The electrode was removed from her leg without difficulty. She will return next week for her 7th PTNS treatment.

## 2015-01-07 ENCOUNTER — Encounter: Payer: Self-pay | Admitting: Urology

## 2015-01-07 ENCOUNTER — Ambulatory Visit (INDEPENDENT_AMBULATORY_CARE_PROVIDER_SITE_OTHER): Payer: Medicare Other | Admitting: Urology

## 2015-01-07 VITALS — BP 120/75 | HR 88 | Ht 62.0 in | Wt 188.6 lb

## 2015-01-07 DIAGNOSIS — N3946 Mixed incontinence: Secondary | ICD-10-CM

## 2015-01-07 LAB — PTNS-PERCUTANEOUS TIBIAL NERVE STIMULATION: Scan Result: 6

## 2015-01-07 NOTE — Progress Notes (Signed)
Chief Complaint:  Chief Complaint  Patient presents with  . Mixed Incontinence    PTNS     HPI: Patient is a 69 year old white female who presents today for her seventh PTNS.    Background story She states she still having a splaying of the urinary stream. She is also wearing pads for protection in case she has some urge or stress incontinence if she has to delay voiding. She denies any dysuria or hematuria. She denies any suprapubic pain or back pain.  She states her symptoms began several months ago and they are not related to any specific event.  Her leakage occurs if she has to delay voiding.  The leakage amount varies.  She will lose urine with coughing, sneezing, or laughing.     After her last treatment, she is leaking up to one time a day (unchanged).  She is having daytime frequency of 5 voids (unchanged), 1 night time voids (unchanged) and no pain with urination.  She has a perception of the need to urinate, but she sometimes cannot get to the bathroom on time.  She is wearing Poise pads and changes them once daily.    Fluid intake # 6 of cups of fluid per day, # 6 of cups of caffeine beverages per day and  # 0 alcohol intake per day.        Previous Therapy:  She is not interested in trying any medications.  Blood pressure 120/75, pulse 88, height 5\' 2"  (1.575 m), weight 188 lb 9.6 oz (85.548 kg).  She has no contraindications present for PTNS, such as: pacemaker, implantable defibrillator, history of abnormal bleeding and a history of neuropathies or nerve damage.  I discussed with patient possible complications of procedure, such as discomfort, bleeding at insertion/stimulation site, procedure consent signed  Patient goals: Her goals are to have no accidents.  PTNS treatment: The needle electrode was inserted into the lower, inner aspect of the patient's left  leg. The surface electrode was placed on the inside arch of the foot on the treatment leg. The lead set was  connected to the stimulator, and the needle electrode clip was connected to the needle electrode. The stimulator that produces an adjustable electrical pulse that travels to sacral nerve plexus via the tibial nerve was increased to 6 and the patient received a sensory response and a toe response.    Treatment Plan:  Patient tolerated the PTNS treatment for 30 minutes without difficulty. The electrode was removed from her leg without difficulty. She will return next week for her 8th PTNS treatment.

## 2015-01-14 ENCOUNTER — Encounter: Payer: Self-pay | Admitting: Obstetrics and Gynecology

## 2015-01-14 ENCOUNTER — Ambulatory Visit (INDEPENDENT_AMBULATORY_CARE_PROVIDER_SITE_OTHER): Payer: Medicare Other | Admitting: Obstetrics and Gynecology

## 2015-01-14 DIAGNOSIS — N3946 Mixed incontinence: Secondary | ICD-10-CM | POA: Diagnosis not present

## 2015-01-14 LAB — PTNS-PERCUTANEOUS TIBIAL NERVE STIMULATION: SCAN RESULT: 4

## 2015-01-14 NOTE — Progress Notes (Signed)
PTNS  Session # 8  Health & Social Factors: No change Caffeine: 6 Alcohol: 0 Daytime voids #per day: 4 Night-time voids #per night: 1 Urgency: Mild Incontinence Episodes #per day: 1 Ankle used: Left Treatment Setting: 4 Feeling/ Response: Sensory Comments: Thing #1  Preformed By: Herbert Moors, FNP  Assistant: Golden Hurter, CMA  Follow Up: 01/20/2014

## 2015-01-14 NOTE — Progress Notes (Signed)
Chief Complaint:  No chief complaint on file.  HPI: Patient is a 69 year old white female who presents today for her 8th PTNS.    Background story She states she still having a splaying of the urinary stream. She is also wearing pads for protection in case she has some urge or stress incontinence if she has to delay voiding. She denies any dysuria or hematuria. She denies any suprapubic pain or back pain.  She states her symptoms began several months ago and they are not related to any specific event.  Her leakage occurs if she has to delay voiding.  The leakage amount varies.  She will lose urine with coughing, sneezing, or laughing.     After her last treatment, she is leaking up to one time a day (unchanged).  She is having daytime frequency of 4 voids (decreased), 1 night time voids (unchanged) and no pain with urination.  She has a perception of the need to urinate, but she sometimes cannot get to the bathroom on time.  She is wearing Poise pads and changes them once daily.    Fluid intake # 6 of cups of fluid per day, # 6 of cups of caffeine beverages per day and  # 0 alcohol intake per day.        Previous Therapy:  She is not interested in trying any medications.  BP 150/72 mmHg  Pulse 96  Resp 16  Wt 188 lb 6.4 oz (85.458 kg)  She has no contraindications present for PTNS, such as: pacemaker, implantable defibrillator, history of abnormal bleeding and a history of neuropathies or nerve damage.  I discussed with patient possible complications of procedure, such as discomfort, bleeding at insertion/stimulation site, procedure consent signed  Patient goals: Her goals are to have no accidents.  PTNS treatment: The needle electrode was inserted into the lower, inner aspect of the patient's left  leg. The surface electrode was placed on the inside arch of the foot on the treatment leg. The lead set was connected to the stimulator, and the needle electrode clip was connected to the  needle electrode. The stimulator that produces an adjustable electrical pulse that travels to sacral nerve plexus via the tibial nerve was increased to 4 and the patient received a sensory response.    Treatment Plan:  Patient tolerated the PTNS treatment for 30 minutes without difficulty. The electrode was removed from her leg without difficulty. She will return next week for her 9th PTNS treatment.

## 2015-01-21 ENCOUNTER — Encounter: Payer: Self-pay | Admitting: Urology

## 2015-01-21 ENCOUNTER — Ambulatory Visit (INDEPENDENT_AMBULATORY_CARE_PROVIDER_SITE_OTHER): Payer: Medicare Other | Admitting: Urology

## 2015-01-21 VITALS — BP 126/66 | HR 105 | Ht 61.0 in | Wt 190.5 lb

## 2015-01-21 DIAGNOSIS — N3946 Mixed incontinence: Secondary | ICD-10-CM

## 2015-01-21 LAB — PTNS-PERCUTANEOUS TIBIAL NERVE STIMULATION

## 2015-01-21 NOTE — Progress Notes (Signed)
Chief Complaint:  Chief Complaint  Patient presents with  . PTNS     HPI: Patient is a 70 year old white female who presents today for her ninth PTNS.    Background story She states she still having a splaying of the urinary stream. She is also wearing pads for protection in case she has some urge or stress incontinence if she has to delay voiding. She denies any dysuria or hematuria. She denies any suprapubic pain or back pain.  She states her symptoms began several months ago and they are not related to any specific event.  Her leakage occurs if she has to delay voiding.  The leakage amount varies.  She will lose urine with coughing, sneezing, or laughing.     After her last treatment, she is leaking up to 2 times a day (worse).  She is having daytime frequency of 4 voids (improved), 1 night time voids (unchanged) and no pain with urination.  She has a perception of the need to urinate, but she sometimes cannot get to the bathroom on time.  She is wearing Poise pads and changes them once daily.    Fluid intake # 6 of cups of fluid per day, # 6 of cups of caffeine beverages per day and  # 0 alcohol intake per day.        Previous Therapy:  She is not interested in trying any medications.  Blood pressure 126/66, pulse 105, height 5\' 1"  (1.549 m), weight 190 lb 8 oz (86.41 kg).  She has no contraindications present for PTNS, such as: pacemaker, implantable defibrillator, history of abnormal bleeding and a history of neuropathies or nerve damage.  I discussed with patient possible complications of procedure, such as discomfort, bleeding at insertion/stimulation site, procedure consent signed  Patient goals: Her goals are to have no accidents.  PTNS treatment: The needle electrode was inserted into the lower, inner aspect of the patient's right  leg. The surface electrode was placed on the inside arch of the foot on the treatment leg. The lead set was connected to the stimulator, and  the needle electrode clip was connected to the needle electrode. The stimulator that produces an adjustable electrical pulse that travels to sacral nerve plexus via the tibial nerve was increased to 11 and the patient received a sensory response.    Treatment Plan:  Patient tolerated the PTNS treatment for 30 minutes without difficulty. The electrode was removed from her leg without difficulty. She will return next week for her 10th PTNS treatment.

## 2015-01-22 ENCOUNTER — Inpatient Hospital Stay: Payer: Medicare Other | Attending: Internal Medicine

## 2015-01-22 DIAGNOSIS — D472 Monoclonal gammopathy: Secondary | ICD-10-CM | POA: Diagnosis not present

## 2015-01-22 LAB — CBC WITH DIFFERENTIAL/PLATELET
Basophils Absolute: 0 10*3/uL (ref 0–0.1)
Basophils Relative: 1 %
EOS ABS: 0.1 10*3/uL (ref 0–0.7)
Eosinophils Relative: 3 %
HCT: 36.1 % (ref 35.0–47.0)
Hemoglobin: 11.6 g/dL — ABNORMAL LOW (ref 12.0–16.0)
LYMPHS ABS: 1.1 10*3/uL (ref 1.0–3.6)
LYMPHS PCT: 36 %
MCH: 28.6 pg (ref 26.0–34.0)
MCHC: 32.3 g/dL (ref 32.0–36.0)
MCV: 88.5 fL (ref 80.0–100.0)
MONOS PCT: 10 %
Monocytes Absolute: 0.3 10*3/uL (ref 0.2–0.9)
Neutro Abs: 1.5 10*3/uL (ref 1.4–6.5)
Neutrophils Relative %: 50 %
Platelets: 135 10*3/uL — ABNORMAL LOW (ref 150–440)
RBC: 4.08 MIL/uL (ref 3.80–5.20)
RDW: 13.8 % (ref 11.5–14.5)
WBC: 3 10*3/uL — AB (ref 3.6–11.0)

## 2015-01-22 LAB — CREATININE, SERUM
Creatinine, Ser: 1.48 mg/dL — ABNORMAL HIGH (ref 0.44–1.00)
GFR calc Af Amer: 40 mL/min — ABNORMAL LOW (ref >60)
GFR calc non Af Amer: 35 mL/min — ABNORMAL LOW (ref >60)

## 2015-01-22 LAB — CALCIUM: Calcium: 9.3 mg/dL (ref 8.9–10.3)

## 2015-01-23 LAB — PROTEIN ELECTROPHORESIS, SERUM
A/G Ratio: 1.1 (ref 0.7–1.7)
ALPHA-1-GLOBULIN: 0.2 g/dL (ref 0.0–0.4)
ALPHA-2-GLOBULIN: 0.8 g/dL (ref 0.4–1.0)
Albumin ELP: 3.2 g/dL (ref 2.9–4.4)
BETA GLOBULIN: 1 g/dL (ref 0.7–1.3)
GAMMA GLOBULIN: 0.9 g/dL (ref 0.4–1.8)
Globulin, Total: 2.9 g/dL (ref 2.2–3.9)
M-SPIKE, %: 0.5 g/dL — AB
TOTAL PROTEIN ELP: 6.1 g/dL (ref 6.0–8.5)

## 2015-01-23 LAB — IGG, IGA, IGM
IgA: 126 mg/dL (ref 87–352)
IgG (Immunoglobin G), Serum: 919 mg/dL (ref 700–1600)
IgM, Serum: 58 mg/dL (ref 26–217)

## 2015-01-27 DIAGNOSIS — R809 Proteinuria, unspecified: Secondary | ICD-10-CM | POA: Diagnosis not present

## 2015-01-27 DIAGNOSIS — I129 Hypertensive chronic kidney disease with stage 1 through stage 4 chronic kidney disease, or unspecified chronic kidney disease: Secondary | ICD-10-CM | POA: Diagnosis not present

## 2015-01-27 DIAGNOSIS — F331 Major depressive disorder, recurrent, moderate: Secondary | ICD-10-CM | POA: Diagnosis not present

## 2015-01-27 DIAGNOSIS — E1122 Type 2 diabetes mellitus with diabetic chronic kidney disease: Secondary | ICD-10-CM | POA: Diagnosis not present

## 2015-01-27 DIAGNOSIS — N2581 Secondary hyperparathyroidism of renal origin: Secondary | ICD-10-CM | POA: Diagnosis not present

## 2015-01-27 DIAGNOSIS — N183 Chronic kidney disease, stage 3 (moderate): Secondary | ICD-10-CM | POA: Diagnosis not present

## 2015-01-28 ENCOUNTER — Encounter: Payer: Self-pay | Admitting: Urology

## 2015-01-28 ENCOUNTER — Ambulatory Visit (INDEPENDENT_AMBULATORY_CARE_PROVIDER_SITE_OTHER): Payer: Medicare Other | Admitting: Urology

## 2015-01-28 VITALS — BP 118/68 | HR 94 | Ht 62.0 in | Wt 187.5 lb

## 2015-01-28 DIAGNOSIS — N3946 Mixed incontinence: Secondary | ICD-10-CM

## 2015-01-28 LAB — KAPPA/LAMBDA LIGHT CHAINS
Kappa free light chain: 30.92 mg/L — ABNORMAL HIGH (ref 3.30–19.40)
Kappa, lambda light chain ratio: 1.7 — ABNORMAL HIGH (ref 0.26–1.65)
LAMDA FREE LIGHT CHAINS: 18.15 mg/L (ref 5.71–26.30)

## 2015-01-28 LAB — PTNS-PERCUTANEOUS TIBIAL NERVE STIMULATION: Scan Result: 2

## 2015-01-28 NOTE — Progress Notes (Signed)
Chief Complaint:  Chief Complaint  Patient presents with  . Mixed Incontinence    PTNS     HPI: Patient is a 70 year old white female who presents today for her 10th PTNS.    Background story She states she still having a splaying of the urinary stream. She is also wearing pads for protection in case she has some urge or stress incontinence if she has to delay voiding. She denies any dysuria or hematuria. She denies any suprapubic pain or back pain.  She states her symptoms began several months ago and they are not related to any specific event.  Her leakage occurs if she has to delay voiding.  The leakage amount varies.  She will lose urine with coughing, sneezing, or laughing.     After her last treatment, she is leaking up to 2 times a day (stable).  She is having daytime frequency of 4 voids (stable), 1 night time voids (unchanged) and no pain with urination.  She has a perception of the need to urinate, but she sometimes cannot get to the bathroom on time.  She is wearing Poise pads and changes them once daily.    Fluid intake # 6 of cups of fluid per day, # 6 of cups of caffeine beverages per day and  # 0 alcohol intake per day.        Previous Therapy:  She is not interested in trying any medications.  Blood pressure 118/68, pulse 94, height 5\' 2"  (1.575 m), weight 187 lb 8 oz (85.049 kg).  She has no contraindications present for PTNS, such as: pacemaker, implantable defibrillator, history of abnormal bleeding and a history of neuropathies or nerve damage.  I discussed with patient possible complications of procedure, such as discomfort, bleeding at insertion/stimulation site, procedure consent signed  Patient goals: Her goals are to have no accidents.  PTNS treatment: The needle electrode was inserted into the lower, inner aspect of the patient's left leg. The surface electrode was placed on the inside arch of the foot on the treatment leg. The lead set was connected to the  stimulator, and the needle electrode clip was connected to the needle electrode. The stimulator that produces an adjustable electrical pulse that travels to sacral nerve plexus via the tibial nerve was increased to 2 and the patient received a sensory response.    Treatment Plan:  Patient tolerated the PTNS treatment for 30 minutes without difficulty. The electrode was removed from her leg without difficulty. She will return next week for her 11th PTNS treatment.

## 2015-02-04 ENCOUNTER — Encounter: Payer: Self-pay | Admitting: Urology

## 2015-02-04 ENCOUNTER — Ambulatory Visit (INDEPENDENT_AMBULATORY_CARE_PROVIDER_SITE_OTHER): Payer: Medicare Other | Admitting: Urology

## 2015-02-04 VITALS — BP 147/70 | HR 92 | Ht 62.0 in | Wt 187.5 lb

## 2015-02-04 DIAGNOSIS — N3946 Mixed incontinence: Secondary | ICD-10-CM | POA: Diagnosis not present

## 2015-02-04 LAB — PTNS-PERCUTANEOUS TIBIAL NERVE STIMULATION: Scan Result: 8

## 2015-02-05 ENCOUNTER — Ambulatory Visit: Payer: Medicare Other | Admitting: Urology

## 2015-02-09 NOTE — Progress Notes (Signed)
Chief Complaint:  Chief Complaint  Patient presents with  . Mixed Incontinence    PTNS     HPI: Patient is a 70 year old white female who presents today for her 11th PTNS.    Background story She states she still having a splaying of the urinary stream. She is also wearing pads for protection in case she has some urge or stress incontinence if she has to delay voiding. She denies any dysuria or hematuria. She denies any suprapubic pain or back pain.  She states her symptoms began several months ago and they are not related to any specific event.  Her leakage occurs if she has to delay voiding.  The leakage amount varies.  She will lose urine with coughing, sneezing, or laughing.     After her last treatment, she is leaking up to 1 times a day (improvement).  She is having daytime frequency of 4 voids (stable), 1 night time voids (stable) and no pain with urination.  She has a perception of the need to urinate, but she sometimes cannot get to the bathroom on time.  She is wearing Poise pads and changes them once daily.    Fluid intake # 6 of cups of fluid per day, # 5 of cups of caffeine beverages per day and  # 0 alcohol intake per day.        Previous Therapy:  She is not interested in trying any medications.  Blood pressure 147/70, pulse 92, height 5\' 2"  (1.575 m), weight 187 lb 8 oz (85.049 kg).  She has no contraindications present for PTNS, such as: pacemaker, implantable defibrillator, history of abnormal bleeding and a history of neuropathies or nerve damage.  I discussed with patient possible complications of procedure, such as discomfort, bleeding at insertion/stimulation site, procedure consent signed  Patient goals: Her goals are to have no accidents.  PTNS treatment: The needle electrode was inserted into the lower, inner aspect of the patient's left leg. The surface electrode was placed on the inside arch of the foot on the treatment leg. The lead set was connected to  the stimulator, and the needle electrode clip was connected to the needle electrode. The stimulator that produces an adjustable electrical pulse that travels to sacral nerve plexus via the tibial nerve was increased to 8 and the patient received a sensory response.    Treatment Plan:  Patient tolerated the PTNS treatment for 30 minutes without difficulty. The electrode was removed from her leg without difficulty. She will return next week for her 12th PTNS treatment.

## 2015-02-11 ENCOUNTER — Ambulatory Visit (INDEPENDENT_AMBULATORY_CARE_PROVIDER_SITE_OTHER): Payer: Medicare Other | Admitting: Urology

## 2015-02-11 ENCOUNTER — Encounter: Payer: Self-pay | Admitting: Urology

## 2015-02-11 VITALS — BP 130/69 | HR 101 | Ht 61.0 in | Wt 184.3 lb

## 2015-02-11 DIAGNOSIS — R32 Unspecified urinary incontinence: Secondary | ICD-10-CM | POA: Diagnosis not present

## 2015-02-11 LAB — PTNS-PERCUTANEOUS TIBIAL NERVE STIMULATION: SCAN RESULT: 12

## 2015-02-12 NOTE — Progress Notes (Signed)
Chief Complaint:  Chief Complaint  Patient presents with  . Mixed Incontinence    PTNS     HPI: Patient is a 70 year old white female who presents today for her 37 th PTNS.    Background story She states she still having a splaying of the urinary stream. She is also wearing pads for protection in case she has some urge or stress incontinence if she has to delay voiding. She denies any dysuria or hematuria. She denies any suprapubic pain or back pain.  She states her symptoms began several months ago and they are not related to any specific event.  Her leakage occurs if she has to delay voiding.  The leakage amount varies.  She will lose urine with coughing, sneezing, or laughing.     After her last treatment, she is leaking up to 1 times a day (stable).  She is having daytime frequency of 4 - 6 voids (stable), 1 night time voids (stable) and no pain with urination.  She has a perception of the need to urinate, but she sometimes cannot get to the bathroom on time.  She is wearing Poise pads and changes them once daily.    Fluid intake # 6 of cups of fluid per day, # 5 of cups of caffeine beverages per day and  # 0 alcohol intake per day.        Previous Therapy:  She is not interested in trying any medications.  Blood pressure 130/69, pulse 101, height 5\' 1"  (1.549 m), weight 184 lb 4.8 oz (83.598 kg).  She has no contraindications present for PTNS, such as: pacemaker, implantable defibrillator, history of abnormal bleeding and a history of neuropathies or nerve damage.  I discussed with patient possible complications of procedure, such as discomfort, bleeding at insertion/stimulation site, procedure consent signed  Patient goals: Her goals are to have no accidents.  PTNS treatment: The needle electrode was inserted into the lower, inner aspect of the patient's left leg. The surface electrode was placed on the inside arch of the foot on the treatment leg. The lead set was connected  to the stimulator, and the needle electrode clip was connected to the needle electrode. The stimulator that produces an adjustable electrical pulse that travels to sacral nerve plexus via the tibial nerve was increased to 12 and the patient received a toe flex and sensory response.    Treatment Plan:  Patient tolerated the PTNS treatment for 30 minutes without difficulty. The electrode was removed from her leg without difficulty.  Patient feels that she has experienced improvement with the PTNS and would like to continue.  She will return in one month for a PTNS treatment.

## 2015-03-11 ENCOUNTER — Ambulatory Visit (INDEPENDENT_AMBULATORY_CARE_PROVIDER_SITE_OTHER): Payer: Medicare Other | Admitting: Urology

## 2015-03-11 ENCOUNTER — Encounter: Payer: Self-pay | Admitting: Urology

## 2015-03-11 VITALS — BP 124/68 | HR 112 | Ht 61.5 in | Wt 175.2 lb

## 2015-03-11 DIAGNOSIS — N3946 Mixed incontinence: Secondary | ICD-10-CM | POA: Diagnosis not present

## 2015-03-11 LAB — PTNS-PERCUTANEOUS TIBIAL NERVE STIMULATION: Scan Result: 6

## 2015-03-11 NOTE — Progress Notes (Addendum)
Chief Complaint:  Chief Complaint  Patient presents with  . Mixed Incontinence    PTNS     HPI: Patient is a 70 year old white female who presents today for her maintenance PTNS.    Background story She states she still having a splaying of the urinary stream. She is also wearing pads for protection in case she has some urge or stress incontinence if she has to delay voiding. She denies any dysuria or hematuria. She denies any suprapubic pain or back pain.  She states her symptoms began several months ago and they are not related to any specific event.  Her leakage occurs if she has to delay voiding.  The leakage amount varies.  She will lose urine with coughing, sneezing, or laughing.     After her last treatment, she is leaking up to 0 times a day (improved).  She is having daytime frequency of 4 - 6 voids (stable), 1 night time voids (stable) and no pain with urination.  She has a perception of the need to urinate, but she sometimes cannot get to the bathroom on time.  She is wearing Poise pads and changes them once daily.    Fluid intake # 6 of cups of fluid per day, # 5 of cups of caffeine beverages per day and  # 0 alcohol intake per day.        Previous Therapy:  She is not interested in trying any medications.  Blood pressure 130/69, pulse 101, height 5\' 1"  (1.549 m), weight 184 lb 4.8 oz (83.598 kg).  She has no contraindications present for PTNS, such as: pacemaker, implantable defibrillator, history of abnormal bleeding and a history of neuropathies or nerve damage.  I discussed with patient possible complications of procedure, such as discomfort, bleeding at insertion/stimulation site, procedure consent signed  Patient goals: Her goals are to have no accidents.  PTNS treatment: The needle electrode was inserted into the lower, inner aspect of the patient's left leg. The surface electrode was placed on the inside arch of the foot on the treatment leg. The lead set was  connected to the stimulator, and the needle electrode clip was connected to the needle electrode. The stimulator that produces an adjustable electrical pulse that travels to sacral nerve plexus via the tibial nerve was increased to 6 and the patient received a toe flex and sensory response.    Treatment Plan:  Patient tolerated the PTNS treatment for 30 minutes without difficulty. The electrode was removed from her leg without difficulty.  Patient feels that she has experienced improvement with the PTNS and would like to continue.  She will return in one month for a PTNS treatment.     After treatment, patient went to the restroom and vomited.  She states that she had some grape drink which irritated her stomach.  I asked her if she wanted [place] to call EMS and she refused.  I informed her that we did have an individual with the flu in our office yesterday and that it was flu season.  She stated she has not eaten today and that is why she felt so bad.    She just wanted to go home and get something to eat.  I advised her to go to the Urgent care and get tested for the flu.  She said she would think about it.  I voiced my concern regarding her living alone and if she does have the flu, she could get very sick and  be unable to take care of herself.  She looked at me and stated she would be fine.    She called when she arrived home.  She ate food.  She feels better.

## 2015-03-12 DIAGNOSIS — Z794 Long term (current) use of insulin: Secondary | ICD-10-CM | POA: Diagnosis not present

## 2015-03-12 DIAGNOSIS — E1165 Type 2 diabetes mellitus with hyperglycemia: Secondary | ICD-10-CM | POA: Diagnosis not present

## 2015-04-09 DIAGNOSIS — E039 Hypothyroidism, unspecified: Secondary | ICD-10-CM | POA: Diagnosis not present

## 2015-04-09 DIAGNOSIS — I10 Essential (primary) hypertension: Secondary | ICD-10-CM | POA: Diagnosis not present

## 2015-04-09 DIAGNOSIS — E1122 Type 2 diabetes mellitus with diabetic chronic kidney disease: Secondary | ICD-10-CM | POA: Diagnosis not present

## 2015-04-09 DIAGNOSIS — E785 Hyperlipidemia, unspecified: Secondary | ICD-10-CM | POA: Diagnosis not present

## 2015-04-09 DIAGNOSIS — Z6835 Body mass index (BMI) 35.0-35.9, adult: Secondary | ICD-10-CM | POA: Diagnosis not present

## 2015-04-09 DIAGNOSIS — N183 Chronic kidney disease, stage 3 (moderate): Secondary | ICD-10-CM | POA: Diagnosis not present

## 2015-04-09 DIAGNOSIS — F325 Major depressive disorder, single episode, in full remission: Secondary | ICD-10-CM | POA: Diagnosis not present

## 2015-04-09 DIAGNOSIS — Z794 Long term (current) use of insulin: Secondary | ICD-10-CM | POA: Diagnosis not present

## 2015-04-09 DIAGNOSIS — R251 Tremor, unspecified: Secondary | ICD-10-CM | POA: Diagnosis not present

## 2015-04-09 DIAGNOSIS — E1169 Type 2 diabetes mellitus with other specified complication: Secondary | ICD-10-CM | POA: Diagnosis not present

## 2015-04-10 ENCOUNTER — Ambulatory Visit (INDEPENDENT_AMBULATORY_CARE_PROVIDER_SITE_OTHER): Payer: Medicare Other | Admitting: Urology

## 2015-04-10 ENCOUNTER — Encounter: Payer: Self-pay | Admitting: Urology

## 2015-04-10 VITALS — BP 88/52 | HR 98 | Ht 61.0 in | Wt 170.2 lb

## 2015-04-10 DIAGNOSIS — N3946 Mixed incontinence: Secondary | ICD-10-CM | POA: Diagnosis not present

## 2015-04-10 LAB — PTNS-PERCUTANEOUS TIBIAL NERVE STIMULATION: Scan Result: 14

## 2015-04-14 NOTE — Progress Notes (Signed)
Chief Complaint:  Chief Complaint  Patient presents with  . Mixed Inconinence    PTNS     HPI: Patient is a 70 year old white female who presents today for her maintenance PTNS.    Background story She states she still having a splaying of the urinary stream. She is also wearing pads for protection in case she has some urge or stress incontinence if she has to delay voiding. She denies any dysuria or hematuria. She denies any suprapubic pain or back pain.  She states her symptoms began several months ago and they are not related to any specific event.  Her leakage occurs if she has to delay voiding.  The leakage amount varies.  She will lose urine with coughing, sneezing, or laughing.     After her last treatment, she is leaking up to 0 times a day (stable).  She is having daytime frequency of 2 voids (improved), 0 night time voids (improved) and no pain with urination.  She has a perception of the need to urinate, but she sometimes cannot get to the bathroom on time.  She is wearing Poise pads and changes them once daily.    Fluid intake # 6 of cups of fluid per day, # 5 of cups of caffeine beverages per day and  # 0 alcohol intake per day.        Previous Therapy:  She is not interested in trying any medications.  Blood pressure 88/52, pulse 98, height 5\' 1"  (1.549 m), weight 170 lb 3.2 oz (77.202 kg).  She has no contraindications present for PTNS, such as: pacemaker, implantable defibrillator, history of abnormal bleeding and a history of neuropathies or nerve damage.  I discussed with patient possible complications of procedure, such as discomfort, bleeding at insertion/stimulation site, procedure consent signed  Patient goals: Her goals are to have no accidents.  PTNS treatment: The needle electrode was inserted into the lower, inner aspect of the patient's left leg. The surface electrode was placed on the inside arch of the foot on the treatment leg. The lead set was  connected to the stimulator, and the needle electrode clip was connected to the needle electrode. The stimulator that produces an adjustable electrical pulse that travels to sacral nerve plexus via the tibial nerve was increased to 14 and the patient received a toe flex and sensory response.    Treatment Plan:  Patient tolerated the PTNS treatment for 30 minutes without difficulty. The electrode was removed from her leg without difficulty.  Patient feels that she has experienced improvement with the PTNS and would like to continue.  She will return in one month for a PTNS treatment.

## 2015-04-23 DIAGNOSIS — E119 Type 2 diabetes mellitus without complications: Secondary | ICD-10-CM | POA: Diagnosis not present

## 2015-04-27 DIAGNOSIS — R1013 Epigastric pain: Secondary | ICD-10-CM | POA: Diagnosis not present

## 2015-04-27 DIAGNOSIS — G4730 Sleep apnea, unspecified: Secondary | ICD-10-CM | POA: Diagnosis not present

## 2015-04-27 DIAGNOSIS — R1310 Dysphagia, unspecified: Secondary | ICD-10-CM | POA: Diagnosis not present

## 2015-04-27 DIAGNOSIS — K219 Gastro-esophageal reflux disease without esophagitis: Secondary | ICD-10-CM | POA: Diagnosis not present

## 2015-04-27 DIAGNOSIS — R0602 Shortness of breath: Secondary | ICD-10-CM | POA: Diagnosis not present

## 2015-04-30 DIAGNOSIS — Z794 Long term (current) use of insulin: Secondary | ICD-10-CM | POA: Diagnosis not present

## 2015-04-30 DIAGNOSIS — E11649 Type 2 diabetes mellitus with hypoglycemia without coma: Secondary | ICD-10-CM | POA: Diagnosis not present

## 2015-04-30 DIAGNOSIS — E1165 Type 2 diabetes mellitus with hyperglycemia: Secondary | ICD-10-CM | POA: Diagnosis not present

## 2015-05-04 DIAGNOSIS — R21 Rash and other nonspecific skin eruption: Secondary | ICD-10-CM | POA: Diagnosis not present

## 2015-05-04 DIAGNOSIS — L438 Other lichen planus: Secondary | ICD-10-CM | POA: Diagnosis not present

## 2015-05-04 DIAGNOSIS — L304 Erythema intertrigo: Secondary | ICD-10-CM | POA: Diagnosis not present

## 2015-06-05 ENCOUNTER — Encounter: Payer: Self-pay | Admitting: *Deleted

## 2015-06-08 ENCOUNTER — Ambulatory Visit: Payer: Medicare Other | Admitting: Certified Registered Nurse Anesthetist

## 2015-06-08 ENCOUNTER — Ambulatory Visit
Admission: RE | Admit: 2015-06-08 | Discharge: 2015-06-08 | Disposition: A | Discharge status: Home / Self Care | Payer: Medicare Other | Source: Ambulatory Visit | Attending: Gastroenterology | Admitting: Gastroenterology

## 2015-06-08 ENCOUNTER — Encounter
Admission: RE | Disposition: A | Discharge status: Home / Self Care | Payer: Self-pay | Source: Ambulatory Visit | Attending: Gastroenterology

## 2015-06-08 ENCOUNTER — Encounter: Payer: Self-pay | Admitting: *Deleted

## 2015-06-08 DIAGNOSIS — I129 Hypertensive chronic kidney disease with stage 1 through stage 4 chronic kidney disease, or unspecified chronic kidney disease: Secondary | ICD-10-CM | POA: Diagnosis not present

## 2015-06-08 DIAGNOSIS — E039 Hypothyroidism, unspecified: Secondary | ICD-10-CM | POA: Insufficient documentation

## 2015-06-08 DIAGNOSIS — Z87891 Personal history of nicotine dependence: Secondary | ICD-10-CM | POA: Insufficient documentation

## 2015-06-08 DIAGNOSIS — R1310 Dysphagia, unspecified: Secondary | ICD-10-CM | POA: Diagnosis not present

## 2015-06-08 DIAGNOSIS — G4733 Obstructive sleep apnea (adult) (pediatric): Secondary | ICD-10-CM | POA: Diagnosis not present

## 2015-06-08 DIAGNOSIS — I10 Essential (primary) hypertension: Secondary | ICD-10-CM | POA: Diagnosis not present

## 2015-06-08 DIAGNOSIS — F329 Major depressive disorder, single episode, unspecified: Secondary | ICD-10-CM | POA: Insufficient documentation

## 2015-06-08 DIAGNOSIS — E119 Type 2 diabetes mellitus without complications: Secondary | ICD-10-CM | POA: Insufficient documentation

## 2015-06-08 DIAGNOSIS — N183 Chronic kidney disease, stage 3 (moderate): Secondary | ICD-10-CM | POA: Insufficient documentation

## 2015-06-08 DIAGNOSIS — K209 Esophagitis, unspecified: Secondary | ICD-10-CM | POA: Diagnosis not present

## 2015-06-08 DIAGNOSIS — K210 Gastro-esophageal reflux disease with esophagitis: Secondary | ICD-10-CM | POA: Insufficient documentation

## 2015-06-08 DIAGNOSIS — Z794 Long term (current) use of insulin: Secondary | ICD-10-CM | POA: Insufficient documentation

## 2015-06-08 DIAGNOSIS — Z79899 Other long term (current) drug therapy: Secondary | ICD-10-CM | POA: Diagnosis not present

## 2015-06-08 HISTORY — PX: ESOPHAGOGASTRODUODENOSCOPY (EGD) WITH PROPOFOL: SHX5813

## 2015-06-08 LAB — GLUCOSE, CAPILLARY: Glucose-Capillary: 158 mg/dL — ABNORMAL HIGH (ref 65–99)

## 2015-06-08 SURGERY — ESOPHAGOGASTRODUODENOSCOPY (EGD) WITH PROPOFOL
Anesthesia: General

## 2015-06-08 MED ORDER — PROPOFOL 500 MG/50ML IV EMUL
INTRAVENOUS | Status: DC | PRN
  Administered 2015-06-08: 140 ug/kg/min via INTRAVENOUS

## 2015-06-08 MED ORDER — LIDOCAINE HCL (CARDIAC) 20 MG/ML IV SOLN
INTRAVENOUS | Status: DC | PRN
  Administered 2015-06-08: 100 mg via INTRAVENOUS

## 2015-06-08 MED ORDER — SODIUM CHLORIDE 0.9 % IV SOLN: INTRAVENOUS | Status: DC

## 2015-06-08 MED ORDER — PROPOFOL 10 MG/ML IV BOLUS
INTRAVENOUS | Status: DC | PRN
  Administered 2015-06-08: 20 mg via INTRAVENOUS
  Administered 2015-06-08: 60 mg via INTRAVENOUS
  Administered 2015-06-08: 20 mg via INTRAVENOUS

## 2015-06-08 MED ORDER — SODIUM CHLORIDE 0.9 % IV SOLN
INTRAVENOUS | Status: DC
  Administered 2015-06-08: 07:00:00 via INTRAVENOUS

## 2015-06-08 MED ORDER — FENTANYL CITRATE (PF) 100 MCG/2ML IJ SOLN
INTRAMUSCULAR | Status: DC | PRN
  Administered 2015-06-08: 50 ug via INTRAVENOUS

## 2015-06-08 NOTE — Anesthesia Postprocedure Evaluation (Signed)
Anesthesia Post Note  Patient: Abigail Walker  Procedure(s) Performed: Procedure(s) (LRB): ESOPHAGOGASTRODUODENOSCOPY (EGD) WITH PROPOFOL (N/A)  Patient location during evaluation: PACU Anesthesia Type: General Level of consciousness: awake and alert Pain management: pain level controlled Vital Signs Assessment: post-procedure vital signs reviewed and stable Respiratory status: spontaneous breathing, nonlabored ventilation, respiratory function stable and patient connected to nasal cannula oxygen Cardiovascular status: blood pressure returned to baseline and stable Postop Assessment: no signs of nausea or vomiting Anesthetic complications: no    Last Vitals:  Filed Vitals:   06/08/15 0840 06/08/15 0850  BP: 170/84 178/72  Pulse: 81 77  Temp:    Resp: 16 20    Last Pain: There were no vitals filed for this visit.               Molli Barrows

## 2015-06-08 NOTE — Anesthesia Preprocedure Evaluation (Signed)
Anesthesia Evaluation  Patient identified by MRN, date of birth, ID band Patient awake    Reviewed: Allergy & Precautions, H&P , NPO status , Patient's Chart, lab work & pertinent test results, reviewed documented beta blocker date and time   Airway Mallampati: II   Neck ROM: full    Dental  (+) Poor Dentition   Pulmonary neg pulmonary ROS, shortness of breath and with exertion, sleep apnea , former smoker,    Pulmonary exam normal        Cardiovascular hypertension, negative cardio ROS Normal cardiovascular exam Rate:Normal     Neuro/Psych PSYCHIATRIC DISORDERS negative neurological ROS  negative psych ROS   GI/Hepatic negative GI ROS, Neg liver ROS, GERD  ,(+) Hepatitis -  Endo/Other  negative endocrine ROSdiabetesHypothyroidism   Renal/GU negative Renal ROS  negative genitourinary   Musculoskeletal   Abdominal   Peds  Hematology negative hematology ROS (+)   Anesthesia Other Findings Past Medical History:   Diabetes mellitus without complication (HCC)                 HBV (hepatitis B virus) infection                            High blood pressure                                          Hives                                                          Comment:food related   Sleep apnea                                                  Thyroid disease                                              Hypothyroidism                                               Depression                                                   Chronic kidney disease                                       GERD (gastroesophageal reflux disease)                       Cancer (HCC)  skin         Mixed incontinence                                           Benign hypertension                             06/16/2013      Comment:Last Assessment & Plan:  Patients blood               pressure has seemingly been  controlled without               significant side effects such as dizziness or               slow heart rate.      Obstructive apnea                               06/16/2013    Acid reflux                                     06/16/2013    Adult hypothyroidism                            06/16/2013      Comment:Last Assessment & Plan:  Tsh and energy have               been stable.   Past Surgical History:   TONSILLECTOMY                                                 TUBAL LIGATION                                                HEMORRHOID SURGERY                                            IMPLANTABLE CONTACT LENS IMPLANTATION                         CATARACT EXTRACTION                             Bilateral            BMI    Body Mass Index   31.60 kg/m 2     Reproductive/Obstetrics                             Anesthesia Physical Anesthesia Plan  ASA: III  Anesthesia Plan: General   Post-op Pain Management:    Induction:   Airway Management Planned:   Additional Equipment:   Intra-op Plan:   Post-operative Plan:  Informed Consent: I have reviewed the patients History and Physical, chart, labs and discussed the procedure including the risks, benefits and alternatives for the proposed anesthesia with the patient or authorized representative who has indicated his/her understanding and acceptance.   Dental Advisory Given  Plan Discussed with: CRNA  Anesthesia Plan Comments:         Anesthesia Quick Evaluation

## 2015-06-08 NOTE — H&P (Signed)
Primary Care Physician:  Kirk Ruths., MD Primary Gastroenterologist:  Dr. Candace Cruise  Pre-Procedure History & Physical: HPI:  Abigail Walker is a 70 y.o. female is here for an EGD   Past Medical History  Diagnosis Date  . Diabetes mellitus without complication ([place])   . HBV (hepatitis B virus) infection   . High blood pressure   . Hives     food related  . Sleep apnea   . Thyroid disease   . Hypothyroidism   . Depression   . Chronic kidney disease   . GERD (gastroesophageal reflux disease)   . Cancer ([place]) skin  . Mixed incontinence   . Benign hypertension 06/16/2013    Last Assessment & Plan:  Patients blood pressure has seemingly been controlled without significant side effects such as dizziness or slow heart rate.     . Obstructive apnea 06/16/2013  . Acid reflux 06/16/2013  . Adult hypothyroidism 06/16/2013    Last Assessment & Plan:  Tsh and energy have been stable.      Past Surgical History  Procedure Laterality Date  . Tonsillectomy    . Tubal ligation    . Hemorrhoid surgery    . Implantable contact lens implantation    . Cataract extraction Bilateral     Prior to Admission medications   Medication Sig Start Date End Date Taking? Authorizing Provider  acetaminophen (TYLENOL) 500 MG tablet Take 500 mg by mouth every 6 (six) hours as needed for moderate pain.    Historical Provider, MD  azelastine (ASTELIN) 0.1 % nasal spray Place into the nose.    Historical Provider, MD  buPROPion (WELLBUTRIN XL) 150 MG 24 hr tablet Take 3 tablet po QD as directed    Historical Provider, MD  calcitRIOL (ROCALTROL) 0.25 MCG capsule  10/28/14   Historical Provider, MD  Cholecalciferol (VITAMIN D3) 2000 UNITS capsule Take by mouth.    Historical Provider, MD  clobetasol cream (TEMOVATE) 0.05 % Apply topically.    Historical Provider, MD  clotrimazole (MYCELEX) 10 MG troche Take 10 mg by mouth 5 (five) times daily.    Historical Provider, MD  estradiol (ESTRACE) 0.1 MG/GM  vaginal cream Place 1 Applicatorful vaginally 3 (three) times a week. 07/28/14   Irine Seal, MD  fluticasone (FLONASE) 50 MCG/ACT nasal spray Place into the nose. 11/27/13 11/27/14  Historical Provider, MD  insulin NPH-regular Human (NOVOLIN 70/30) (70-30) 100 UNIT/ML injection Inject 16 units before breakfast and 26 units before supper as directed 11/13/13   Historical Provider, MD  Insulin Pen Needle 31G X 5 MM MISC by Does not apply route.    Historical Provider, MD  Insulin Syringe-Needle U-100 (INSULIN SYRINGE 1CC/31GX5/16") 31G X 5/16" 1 ML MISC USE ONE SYRINGE TWICE DAILY AS DIRECTED WITH INSULIN VIALS 05/26/14   Historical Provider, MD  levothyroxine (SYNTHROID, LEVOTHROID) 75 MCG tablet TAKE ONE TABLET BY MOUTH ONCE DAILY 05/21/14   Historical Provider, MD  nystatin cream (MYCOSTATIN)  01/08/14   Historical Provider, MD  pantoprazole (PROTONIX) 40 MG tablet Take by mouth. 07/07/14   Historical Provider, MD  PARoxetine (PAXIL) 40 MG tablet Take by mouth.    Historical Provider, MD  pimecrolimus (ELIDEL) 1 % cream Apply topically 2 (two) times daily. Reported on 04/10/2015    Historical Provider, MD  tacrolimus (PROGRAF) 1 MG capsule Take by mouth.    Historical Provider, MD  torsemide (DEMADEX) 10 MG tablet Take by mouth.    Historical Provider, MD    Allergies  as of 05/29/2015 - Review Complete 04/10/2015  Allergen Reaction Noted  . Sulfa antibiotics Nausea And Vomiting 07/28/2014  . Amoxicillin-pot clavulanate Rash 07/28/2014    Family History  Problem Relation Age of Onset  . Heart disease Father   . Cancer Mother   . Kidney failure Maternal Aunt     Social History   Social History  . Marital Status: Single    Spouse Name: N/A  . Number of Children: N/A  . Years of Education: N/A   Occupational History  . Not on file.   Social History Main Topics  . Smoking status: Former Smoker    Quit date: 07/27/1993  . Smokeless tobacco: Not on file     Comment: quit 20 years  .  Alcohol Use: No  . Drug Use: No  . Sexual Activity: Not on file   Other Topics Concern  . Not on file   Social History Narrative    Review of Systems: See HPI, otherwise negative ROS  Physical Exam: BP 151/65 mmHg  Pulse 87  Temp(Src) 97.5 F (36.4 C) (Tympanic)  Resp 18  Ht 5' 1.5" (1.562 m)  Wt 170 lb (77.111 kg)  BMI 31.60 kg/m2  SpO2 98% General:   Alert,  pleasant and cooperative in NAD Head:  Normocephalic and atraumatic. Neck:  Supple; no masses or thyromegaly. Lungs:  Clear throughout to auscultation.    Heart:  Regular rate and rhythm. Abdomen:  Soft, nontender and nondistended. Normal bowel sounds, without guarding, and without rebound.   Neurologic:  Alert and  oriented x4;  grossly normal neurologically.  Impression/Plan: Abigail Walker is here for an EGD to be performed for dysphagia, GERD  Risks, benefits, limitations, and alternatives regarding EGD have been reviewed with the patient.  Questions have been answered.  All parties agreeable.   Darika Ildefonso, Lupita Dawn, MD  06/08/2015, 7:59 AM

## 2015-06-08 NOTE — Transfer of Care (Signed)
Immediate Anesthesia Transfer of Care Note  Patient: Abigail Walker  Procedure(s) Performed: Procedure(s): ESOPHAGOGASTRODUODENOSCOPY (EGD) WITH PROPOFOL (N/A)  Patient Location: PACU  Anesthesia Type:General  Level of Consciousness: awake and alert   Airway & Oxygen Therapy: Patient Spontanous Breathing and Patient connected to nasal cannula oxygen  Post-op Assessment: Report given to RN and Post -op Vital signs reviewed and stable  Post vital signs: Reviewed and stable  Last Vitals:  Filed Vitals:   06/08/15 0713 06/08/15 0816  BP: 151/65 189/78  Pulse: 87 86  Temp: 36.4 C 36.2 C  Resp: 18 17    Last Pain: There were no vitals filed for this visit.       Complications: No apparent anesthesia complications

## 2015-06-08 NOTE — Op Note (Signed)
[facility] Gastroenterology Patient Name: Abigail Walker Procedure Date: 06/08/2015 8:06 AM MRN: [id number] Account #: [id number] Date of Birth: [birth year] Admit Type: Outpatient Age: 70 Room: [facility] ENDO ROOM 4 Gender: Female Note Status: Finalized Procedure:            Upper GI endoscopy Indications:          Dysphagia, Suspected esophageal reflux Providers:            Lupita Dawn. Candace Cruise, MD Referring MD:         Ocie Cornfield. Ouida Sills, MD (Referring MD) Medicines:            Monitored Anesthesia Care Complications:        No immediate complications. Procedure:            Pre-Anesthesia Assessment:                       - Prior to the procedure, a History and Physical was                        performed, and patient medications, allergies and                        sensitivities were reviewed. The patient's tolerance of                        previous anesthesia was reviewed.                       - The risks and benefits of the procedure and the                        sedation options and risks were discussed with the                        patient. All questions were answered and informed                        consent was obtained.                       - After reviewing the risks and benefits, the patient                        was deemed in satisfactory condition to undergo the                        procedure.                       After obtaining informed consent, the endoscope was                        passed under direct vision. Throughout the procedure,                        the patient's blood pressure, pulse, and oxygen                        saturations were monitored continuously. The Endoscope  was introduced through the mouth, and advanced to the                        second part of duodenum. The upper GI endoscopy was                        accomplished without difficulty. The patient tolerated                        the procedure  well. Findings:      LA Grade A (one or more mucosal breaks less than 5 mm, not extending       between tops of 2 mucosal folds) esophagitis was found at the       gastroesophageal junction. The scope was withdrawn. Dilation was       performed with a Maloney dilator with mild resistance at 61 Fr. Biopsies       were taken with a cold forceps for histology.      The exam of the esophagus was otherwise normal.      The entire examined stomach was normal.      The examined duodenum was normal. Impression:           - LA Grade A reflux esophagitis. Dilated. Biopsied. Recommendation:       - Discharge patient to home.                       - Observe patient's clinical course.                       - The findings and recommendations were discussed with                        the patient. Procedure Code(s):    --- Professional ---                       [phone], Esophagogastroduodenoscopy, flexible, transoral;                        with biopsy, single or multiple                       43450, Dilation of esophagus, by unguided sound or                        bougie, single or multiple passes Diagnosis Code(s):    --- Professional ---                       K21.0, Gastro-esophageal reflux disease with esophagitis                       R13.10, Dysphagia, unspecified CPT copyright 2016 American Medical Association. All rights reserved. The codes documented in this report are preliminary and upon coder review may  be revised to meet current compliance requirements. Hulen Luster, MD 06/08/2015 8:14:40 AM This report has been signed electronically. Number of Addenda: 0 Note Initiated On: 06/08/2015 8:06 AM      [facility]

## 2015-06-09 ENCOUNTER — Encounter: Payer: Self-pay | Admitting: Gastroenterology

## 2015-06-09 LAB — SURGICAL PATHOLOGY

## 2015-06-16 ENCOUNTER — Ambulatory Visit
Admission: RE | Admit: 2015-06-16 | Discharge: 2015-06-16 | Disposition: A | Discharge status: Home / Self Care | Payer: Medicare Other | Source: Ambulatory Visit | Attending: Internal Medicine | Admitting: Internal Medicine

## 2015-06-16 ENCOUNTER — Other Ambulatory Visit: Payer: Self-pay | Admitting: Internal Medicine

## 2015-06-16 DIAGNOSIS — R609 Edema, unspecified: Secondary | ICD-10-CM | POA: Diagnosis not present

## 2015-06-16 DIAGNOSIS — R600 Localized edema: Secondary | ICD-10-CM | POA: Insufficient documentation

## 2015-06-16 DIAGNOSIS — I82431 Acute embolism and thrombosis of right popliteal vein: Secondary | ICD-10-CM | POA: Diagnosis not present

## 2015-06-17 ENCOUNTER — Other Ambulatory Visit: Payer: Self-pay | Admitting: Internal Medicine

## 2015-06-17 DIAGNOSIS — R2240 Localized swelling, mass and lump, unspecified lower limb: Secondary | ICD-10-CM

## 2015-06-17 DIAGNOSIS — R600 Localized edema: Secondary | ICD-10-CM

## 2015-06-19 ENCOUNTER — Ambulatory Visit
Admission: RE | Admit: 2015-06-19 | Discharge: 2015-06-19 | Disposition: A | Discharge status: Home / Self Care | Payer: Medicare Other | Source: Ambulatory Visit | Attending: Internal Medicine | Admitting: Internal Medicine

## 2015-06-19 DIAGNOSIS — R600 Localized edema: Secondary | ICD-10-CM | POA: Diagnosis not present

## 2015-06-19 DIAGNOSIS — I82431 Acute embolism and thrombosis of right popliteal vein: Secondary | ICD-10-CM | POA: Diagnosis not present

## 2015-06-19 DIAGNOSIS — R2240 Localized swelling, mass and lump, unspecified lower limb: Secondary | ICD-10-CM

## 2015-07-07 ENCOUNTER — Other Ambulatory Visit: Payer: Self-pay | Admitting: *Deleted

## 2015-07-07 DIAGNOSIS — D472 Monoclonal gammopathy: Secondary | ICD-10-CM

## 2015-07-08 ENCOUNTER — Other Ambulatory Visit: Payer: Medicare Other

## 2015-07-08 ENCOUNTER — Ambulatory Visit: Payer: Medicare Other | Admitting: Internal Medicine

## 2015-07-08 ENCOUNTER — Inpatient Hospital Stay (HOSPITAL_BASED_OUTPATIENT_CLINIC_OR_DEPARTMENT_OTHER): Payer: Medicare Other | Admitting: Internal Medicine

## 2015-07-08 ENCOUNTER — Inpatient Hospital Stay: Payer: Medicare Other | Attending: Internal Medicine

## 2015-07-08 VITALS — BP 171/70 | HR 91 | Temp 97.8°F | Resp 18 | Wt 177.4 lb

## 2015-07-08 DIAGNOSIS — D472 Monoclonal gammopathy: Secondary | ICD-10-CM | POA: Diagnosis not present

## 2015-07-08 DIAGNOSIS — E039 Hypothyroidism, unspecified: Secondary | ICD-10-CM | POA: Diagnosis not present

## 2015-07-08 DIAGNOSIS — Z79899 Other long term (current) drug therapy: Secondary | ICD-10-CM | POA: Diagnosis not present

## 2015-07-08 DIAGNOSIS — E119 Type 2 diabetes mellitus without complications: Secondary | ICD-10-CM | POA: Insufficient documentation

## 2015-07-08 DIAGNOSIS — I10 Essential (primary) hypertension: Secondary | ICD-10-CM | POA: Insufficient documentation

## 2015-07-08 DIAGNOSIS — K219 Gastro-esophageal reflux disease without esophagitis: Secondary | ICD-10-CM | POA: Diagnosis not present

## 2015-07-08 DIAGNOSIS — Z87891 Personal history of nicotine dependence: Secondary | ICD-10-CM | POA: Diagnosis not present

## 2015-07-08 DIAGNOSIS — D508 Other iron deficiency anemias: Secondary | ICD-10-CM

## 2015-07-08 DIAGNOSIS — G4730 Sleep apnea, unspecified: Secondary | ICD-10-CM | POA: Insufficient documentation

## 2015-07-08 DIAGNOSIS — B1910 Unspecified viral hepatitis B without hepatic coma: Secondary | ICD-10-CM | POA: Diagnosis not present

## 2015-07-08 LAB — CREATININE, SERUM
CREATININE: 1.43 mg/dL — AB (ref 0.44–1.00)
GFR, EST AFRICAN AMERICAN: 42 mL/min — AB (ref >60)
GFR, EST NON AFRICAN AMERICAN: 36 mL/min — AB (ref >60)

## 2015-07-08 LAB — CBC WITH DIFFERENTIAL/PLATELET
BASOS ABS: 0 10*3/uL (ref 0–0.1)
BASOS PCT: 1 %
EOS ABS: 0.1 10*3/uL (ref 0–0.7)
Eosinophils Relative: 5 %
HEMATOCRIT: 33.6 % — AB (ref 35.0–47.0)
HEMOGLOBIN: 11 g/dL — AB (ref 12.0–16.0)
Lymphocytes Relative: 41 %
Lymphs Abs: 1.1 10*3/uL (ref 1.0–3.6)
MCH: 28.6 pg (ref 26.0–34.0)
MCHC: 32.9 g/dL (ref 32.0–36.0)
MCV: 87 fL (ref 80.0–100.0)
Monocytes Absolute: 0.3 10*3/uL (ref 0.2–0.9)
Monocytes Relative: 11 %
NEUTROS ABS: 1.1 10*3/uL — AB (ref 1.4–6.5)
NEUTROS PCT: 42 %
Platelets: 161 10*3/uL (ref 150–440)
RBC: 3.86 MIL/uL (ref 3.80–5.20)
RDW: 13.3 % (ref 11.5–14.5)
WBC: 2.6 10*3/uL — AB (ref 3.6–11.0)

## 2015-07-08 LAB — IRON AND TIBC
Iron: 23 ug/dL — ABNORMAL LOW (ref 28–170)
SATURATION RATIOS: 7 % — AB (ref 10.4–31.8)
TIBC: 352 ug/dL (ref 250–450)
UIBC: 329 ug/dL

## 2015-07-08 LAB — FERRITIN: Ferritin: 8 ng/mL — ABNORMAL LOW (ref 11–307)

## 2015-07-08 LAB — CALCIUM: Calcium: 9.2 mg/dL (ref 8.9–10.3)

## 2015-07-08 NOTE — Progress Notes (Signed)
[place] OFFICE PROGRESS NOTE  Patient Care Team: Kirk Ruths, MD as PCP - General (Internal Medicine)  No matching staging information was found for the patient.    No history exists.    # MGUS M protein 0.5 g bone marrow biopsy October 2016 [Dr.Pandit]- no myeloma cells/? Hemodilution.  # CKD [creat- 1.4; Dr.Kolluru]    INTERVAL HISTORY:  Abigail Walker 70 y.o.  female pleasant patient above history of MGUS is here for follow-up.  Patient denies any nausea vomiting. Denies any abnormal tingling and numbness. Denies any chest pain or cough.  No swelling in the legs.  REVIEW OF SYSTEMS:  A complete 10 point review of system is done which is negative except mentioned above/history of present illness.   PAST MEDICAL HISTORY :  Past Medical History  Diagnosis Date  . Diabetes mellitus without complication ([place])   . HBV (hepatitis B virus) infection   . High blood pressure   . Hives     food related  . Sleep apnea   . Thyroid disease   . Hypothyroidism   . Depression   . Chronic kidney disease   . GERD (gastroesophageal reflux disease)   . Cancer ([place]) skin  . Mixed incontinence   . Benign hypertension 06/16/2013    Last Assessment & Plan:  Patients blood pressure has seemingly been controlled without significant side effects such as dizziness or slow heart rate.     . Obstructive apnea 06/16/2013  . Acid reflux 06/16/2013  . Adult hypothyroidism 06/16/2013    Last Assessment & Plan:  Tsh and energy have been stable.      PAST SURGICAL HISTORY :   Past Surgical History  Procedure Laterality Date  . Tonsillectomy    . Tubal ligation    . Hemorrhoid surgery    . Implantable contact lens implantation    . Cataract extraction Bilateral   . Esophagogastroduodenoscopy (egd) with propofol N/A 06/08/2015    Procedure: ESOPHAGOGASTRODUODENOSCOPY (EGD) WITH PROPOFOL;  Surgeon: Hulen Luster, MD;  Location: [facility] ENDOSCOPY;  Service: Gastroenterology;   Laterality: N/A;    FAMILY HISTORY :   Family History  Problem Relation Age of Onset  . Heart disease Father   . Cancer Mother   . Kidney failure Maternal Aunt     SOCIAL HISTORY:   Social History  Substance Use Topics  . Smoking status: Former Smoker    Quit date: 07/27/1993  . Smokeless tobacco: Not on file     Comment: quit 20 years  . Alcohol Use: No    ALLERGIES:  is allergic to sulfa antibiotics and amoxicillin-pot clavulanate.  MEDICATIONS:  Current Outpatient Prescriptions  Medication Sig Dispense Refill  . acetaminophen (TYLENOL) 500 MG tablet Take 500 mg by mouth every 6 (six) hours as needed for moderate pain.    Marland Kitchen azelastine (ASTELIN) 0.1 % nasal spray Place into the nose.    Marland Kitchen buPROPion (WELLBUTRIN XL) 150 MG 24 hr tablet Take 3 tablet po QD as directed    . calcitRIOL (ROCALTROL) 0.25 MCG capsule     . Cholecalciferol (VITAMIN D3) 2000 UNITS capsule Take by mouth.    . clobetasol cream (TEMOVATE) 0.05 % Apply topically.    . fluticasone (FLONASE) 50 MCG/ACT nasal spray Place into the nose.    . insulin NPH-regular Human (NOVOLIN 70/30) (70-30) 100 UNIT/ML injection Inject 16 units before breakfast and 26 units before supper as directed    . Insulin Syringe-Needle U-100 (INSULIN  SYRINGE 1CC/31GX5/16") 31G X 5/16" 1 ML MISC USE ONE SYRINGE TWICE DAILY AS DIRECTED WITH INSULIN VIALS    . levothyroxine (SYNTHROID, LEVOTHROID) 75 MCG tablet TAKE ONE TABLET BY MOUTH ONCE DAILY    . pantoprazole (PROTONIX) 40 MG tablet Take by mouth.    Marland Kitchen PARoxetine (PAXIL) 40 MG tablet Take by mouth.    . pravastatin (PRAVACHOL) 80 MG tablet     . tacrolimus (PROGRAF) 1 MG capsule Take by mouth.    . torsemide (DEMADEX) 10 MG tablet Take by mouth.    . clotrimazole (MYCELEX) 10 MG troche Take 10 mg by mouth 5 (five) times daily. Reported on 07/08/2015     No current facility-administered medications for this visit.    PHYSICAL EXAMINATION: ECOG PERFORMANCE STATUS: 0 -  Asymptomatic  BP 171/70 mmHg  Pulse 91  Temp(Src) 97.8 F (36.6 C) (Tympanic)  Resp 18  Wt 177 lb 5.8 oz (80.45 kg)  Filed Weights   07/08/15 1426  Weight: 177 lb 5.8 oz (80.45 kg)    GENERAL: Well-nourished well-developed; Alert, no distress and comfortable.alone EYES: no pallor or icterus OROPHARYNX: no thrush or ulceration; poor dentition  NECK: supple, no masses felt LYMPH:  no palpable lymphadenopathy in the cervical, axillary or inguinal regions LUNGS: clear to auscultation and  No wheeze or crackles HEART/CVS: regular rate & rhythm and no murmurs; No lower extremity edema ABDOMEN:abdomen soft, non-tender and normal bowel sounds Musculoskeletal:no cyanosis of digits and no clubbing  PSYCH: alert & oriented x 3 with fluent speech NEURO: no focal motor/sensory deficits SKIN:  no rashes or significant lesions  LABORATORY DATA:  I have reviewed the data as listed    Component Value Date/Time   NA 140 05/19/2011 0409   K 4.2 05/19/2011 0409   CL 105 05/19/2011 0409   CO2 28 05/19/2011 0409   GLUCOSE 158* 05/19/2011 0409   BUN 15 05/19/2011 0409   CREATININE 1.43* 07/08/2015 1350   CREATININE 1.18 05/19/2011 0409   CALCIUM 9.2 07/08/2015 1350   CALCIUM 9.2 05/19/2011 0409   PROT 7.1 05/14/2011 1432   ALBUMIN 3.2* 05/14/2011 1432   AST 20 05/14/2011 1432   ALT 20 05/14/2011 1432   ALKPHOS 88 05/14/2011 1432   BILITOT 0.3 05/14/2011 1432   GFRNONAA 36* 07/08/2015 1350   GFRNONAA 48* 05/19/2011 0409   GFRAA 42* 07/08/2015 1350   GFRAA 56* 05/19/2011 0409    No results found for: SPEP, UPEP  Lab Results  Component Value Date   WBC 2.6* 07/08/2015   NEUTROABS 1.1* 07/08/2015   HGB 11.0* 07/08/2015   HCT 33.6* 07/08/2015   MCV 87.0 07/08/2015   PLT 161 07/08/2015      Chemistry      Component Value Date/Time   NA 140 05/19/2011 0409   K 4.2 05/19/2011 0409   CL 105 05/19/2011 0409   CO2 28 05/19/2011 0409   BUN 15 05/19/2011 0409   CREATININE 1.43*  07/08/2015 1350   CREATININE 1.18 05/19/2011 0409      Component Value Date/Time   CALCIUM 9.2 07/08/2015 1350   CALCIUM 9.2 05/19/2011 0409   ALKPHOS 88 05/14/2011 1432   AST 20 05/14/2011 1432   ALT 20 05/14/2011 1432   BILITOT 0.3 05/14/2011 1432       RADIOGRAPHIC STUDIES: I have personally reviewed the radiological images as listed and agreed with the findings in the report. No results found.   ASSESSMENT & PLAN:  No problem-specific assessment & plan notes  found for this encounter.   No orders of the defined types were placed in this encounter.   All questions were answered. The patient knows to call the clinic with any problems, questions or concerns.      Cammie Sickle, MD 07/08/2015 2:50 PM

## 2015-07-08 NOTE — Addendum Note (Signed)
Addended by: Sabino Gasser on: 07/08/2015 04:11 PM   Modules accepted: Orders

## 2015-07-08 NOTE — Assessment & Plan Note (Addendum)
Monoclonal gammopathy of unknown significance; most recent M protein in approximately 6 months ago showed 0.5 g/dL. Patient had a bone marrow biopsy-6 months ago that did not show any significant plasma cells/question hemodilution. Labs reviewed- creatinine stable at 1.4. Hemoglobin -baseline.  # Mild leukopenia white count of 2.6 ANC 1.1; hemoglobin 11- since bone marrow biopsy 6 months ago was negative; I'm not suspicious of any bone marrow process. We'll check iron studies today. If you're recommend iron. Await labs in 6 months.  # Clinically no evidence of progression to multiple myeloma. Discussed the risk is fairly small.  Recommend follow-up in 6 months with CBC CMP myeloma workup 1 week prior.

## 2015-07-09 ENCOUNTER — Other Ambulatory Visit: Payer: Self-pay | Admitting: *Deleted

## 2015-07-09 ENCOUNTER — Telehealth: Payer: Self-pay | Admitting: *Deleted

## 2015-07-09 DIAGNOSIS — D509 Iron deficiency anemia, unspecified: Secondary | ICD-10-CM

## 2015-07-09 LAB — PROTEIN ELECTROPHORESIS, SERUM
A/G Ratio: 1.2 (ref 0.7–1.7)
ALBUMIN ELP: 3.3 g/dL (ref 2.9–4.4)
ALPHA-1-GLOBULIN: 0.2 g/dL (ref 0.0–0.4)
ALPHA-2-GLOBULIN: 0.7 g/dL (ref 0.4–1.0)
BETA GLOBULIN: 0.9 g/dL (ref 0.7–1.3)
GAMMA GLOBULIN: 0.9 g/dL (ref 0.4–1.8)
Globulin, Total: 2.7 g/dL (ref 2.2–3.9)
M-Spike, %: 0.4 g/dL — ABNORMAL HIGH
Total Protein ELP: 6 g/dL (ref 6.0–8.5)

## 2015-07-09 LAB — KAPPA/LAMBDA LIGHT CHAINS
KAPPA FREE LGHT CHN: 33.3 mg/L — AB (ref 3.3–19.4)
Kappa, lambda light chain ratio: 1.75 — ABNORMAL HIGH (ref 0.26–1.65)
Lambda free light chains: 19 mg/L (ref 5.7–26.3)

## 2015-07-09 NOTE — Telephone Encounter (Signed)
Called patient and LVM that she should come by [facility] and pick up 2 stool cards from our lab.  Instructed patient to come to registration desk and tell them what she is here for and they will direct her to the lab.  Patient is being referred back to Dr. Candace Cruise. Sheliah Plane shows patient had endoscopy but no colonoscopy in the past two years.  Patient will follow up at [facility] in 3 months.

## 2015-07-09 NOTE — Telephone Encounter (Addendum)
-----   Message from Cammie Sickle, MD sent at 07/09/2015  7:58 AM EDT ----- Please inform pt that she is low on iron; order stool occult cards; also check re: last colonoscopy- if none in last 2 years- recommend GI referal for colonoscopy; recommend PO iron BID; re-check cbc/iron studies/ferritin in 3 months- Thx  Pt has been evaluated by Dr. Candace Cruise in the past-I will send a new referral back to Dr. Myrna Blazer office for colonoscopy.  Upper endo was performed on Jun 08, 2015.  Occult stool cards orders placed in [facility].  Rodena Piety, please contact patient with md recommendations. She will need to come back to cancer center asap to pick up 2 stool cards.  msg sent to cancer center scheduling to arrange for lab only apt in 3 months. These labs were ordered in [facility].

## 2015-07-13 ENCOUNTER — Telehealth: Payer: Self-pay | Admitting: *Deleted

## 2015-07-13 NOTE — Telephone Encounter (Signed)
Patient was contacted on 07-09-15 to come in and pick up 2 stool cards.  Patient called back today and left message that she will not come for the stool cards.  She states she does not want to do this.

## 2015-07-17 DIAGNOSIS — E785 Hyperlipidemia, unspecified: Secondary | ICD-10-CM | POA: Diagnosis not present

## 2015-07-17 DIAGNOSIS — R809 Proteinuria, unspecified: Secondary | ICD-10-CM | POA: Diagnosis not present

## 2015-07-17 DIAGNOSIS — D631 Anemia in chronic kidney disease: Secondary | ICD-10-CM | POA: Diagnosis not present

## 2015-07-17 DIAGNOSIS — N183 Chronic kidney disease, stage 3 (moderate): Secondary | ICD-10-CM | POA: Diagnosis not present

## 2015-07-17 DIAGNOSIS — I10 Essential (primary) hypertension: Secondary | ICD-10-CM | POA: Diagnosis not present

## 2015-07-17 DIAGNOSIS — E1122 Type 2 diabetes mellitus with diabetic chronic kidney disease: Secondary | ICD-10-CM | POA: Diagnosis not present

## 2015-07-17 DIAGNOSIS — N2581 Secondary hyperparathyroidism of renal origin: Secondary | ICD-10-CM | POA: Diagnosis not present

## 2015-07-22 DIAGNOSIS — N183 Chronic kidney disease, stage 3 (moderate): Secondary | ICD-10-CM | POA: Diagnosis not present

## 2015-07-22 DIAGNOSIS — R809 Proteinuria, unspecified: Secondary | ICD-10-CM | POA: Diagnosis not present

## 2015-07-22 DIAGNOSIS — E1122 Type 2 diabetes mellitus with diabetic chronic kidney disease: Secondary | ICD-10-CM | POA: Diagnosis not present

## 2015-07-22 DIAGNOSIS — N2581 Secondary hyperparathyroidism of renal origin: Secondary | ICD-10-CM | POA: Diagnosis not present

## 2015-07-22 DIAGNOSIS — I129 Hypertensive chronic kidney disease with stage 1 through stage 4 chronic kidney disease, or unspecified chronic kidney disease: Secondary | ICD-10-CM | POA: Diagnosis not present

## 2015-07-28 DIAGNOSIS — F331 Major depressive disorder, recurrent, moderate: Secondary | ICD-10-CM | POA: Diagnosis not present

## 2015-08-06 DIAGNOSIS — Z794 Long term (current) use of insulin: Secondary | ICD-10-CM | POA: Diagnosis not present

## 2015-08-06 DIAGNOSIS — E1165 Type 2 diabetes mellitus with hyperglycemia: Secondary | ICD-10-CM | POA: Diagnosis not present

## 2015-08-10 DIAGNOSIS — Z794 Long term (current) use of insulin: Secondary | ICD-10-CM | POA: Diagnosis not present

## 2015-08-10 DIAGNOSIS — E1169 Type 2 diabetes mellitus with other specified complication: Secondary | ICD-10-CM | POA: Diagnosis not present

## 2015-08-10 DIAGNOSIS — Z9989 Dependence on other enabling machines and devices: Secondary | ICD-10-CM | POA: Diagnosis not present

## 2015-08-10 DIAGNOSIS — E1122 Type 2 diabetes mellitus with diabetic chronic kidney disease: Secondary | ICD-10-CM | POA: Diagnosis not present

## 2015-08-10 DIAGNOSIS — N183 Chronic kidney disease, stage 3 (moderate): Secondary | ICD-10-CM | POA: Diagnosis not present

## 2015-08-10 DIAGNOSIS — Z6835 Body mass index (BMI) 35.0-35.9, adult: Secondary | ICD-10-CM | POA: Diagnosis not present

## 2015-08-10 DIAGNOSIS — F325 Major depressive disorder, single episode, in full remission: Secondary | ICD-10-CM | POA: Diagnosis not present

## 2015-08-10 DIAGNOSIS — E785 Hyperlipidemia, unspecified: Secondary | ICD-10-CM | POA: Diagnosis not present

## 2015-08-10 DIAGNOSIS — I10 Essential (primary) hypertension: Secondary | ICD-10-CM | POA: Diagnosis not present

## 2015-08-10 DIAGNOSIS — G4733 Obstructive sleep apnea (adult) (pediatric): Secondary | ICD-10-CM | POA: Diagnosis not present

## 2015-08-10 DIAGNOSIS — E039 Hypothyroidism, unspecified: Secondary | ICD-10-CM | POA: Diagnosis not present

## 2015-08-12 DIAGNOSIS — G250 Essential tremor: Secondary | ICD-10-CM | POA: Diagnosis not present

## 2015-08-13 DIAGNOSIS — E1122 Type 2 diabetes mellitus with diabetic chronic kidney disease: Secondary | ICD-10-CM | POA: Diagnosis not present

## 2015-08-13 DIAGNOSIS — E1165 Type 2 diabetes mellitus with hyperglycemia: Secondary | ICD-10-CM | POA: Diagnosis not present

## 2015-08-13 DIAGNOSIS — E039 Hypothyroidism, unspecified: Secondary | ICD-10-CM | POA: Diagnosis not present

## 2015-08-13 DIAGNOSIS — E11649 Type 2 diabetes mellitus with hypoglycemia without coma: Secondary | ICD-10-CM | POA: Diagnosis not present

## 2015-08-13 DIAGNOSIS — Z794 Long term (current) use of insulin: Secondary | ICD-10-CM | POA: Diagnosis not present

## 2015-08-13 DIAGNOSIS — Z9189 Other specified personal risk factors, not elsewhere classified: Secondary | ICD-10-CM | POA: Diagnosis not present

## 2015-08-13 DIAGNOSIS — N183 Chronic kidney disease, stage 3 (moderate): Secondary | ICD-10-CM | POA: Diagnosis not present

## 2015-08-18 DIAGNOSIS — F331 Major depressive disorder, recurrent, moderate: Secondary | ICD-10-CM | POA: Diagnosis not present

## 2015-10-09 ENCOUNTER — Inpatient Hospital Stay: Payer: Medicare Other | Attending: Internal Medicine

## 2015-10-09 DIAGNOSIS — D472 Monoclonal gammopathy: Secondary | ICD-10-CM | POA: Diagnosis not present

## 2015-10-09 DIAGNOSIS — D509 Iron deficiency anemia, unspecified: Secondary | ICD-10-CM

## 2015-10-09 DIAGNOSIS — Z79899 Other long term (current) drug therapy: Secondary | ICD-10-CM | POA: Diagnosis not present

## 2015-10-09 LAB — FERRITIN: FERRITIN: 13 ng/mL (ref 11–307)

## 2015-10-09 LAB — CBC WITH DIFFERENTIAL/PLATELET
BASOS PCT: 1 %
Basophils Absolute: 0 10*3/uL (ref 0–0.1)
EOS ABS: 0.1 10*3/uL (ref 0–0.7)
EOS PCT: 3 %
HCT: 36.2 % (ref 35.0–47.0)
Hemoglobin: 11.7 g/dL — ABNORMAL LOW (ref 12.0–16.0)
LYMPHS ABS: 1 10*3/uL (ref 1.0–3.6)
Lymphocytes Relative: 40 %
MCH: 27.1 pg (ref 26.0–34.0)
MCHC: 32.4 g/dL (ref 32.0–36.0)
MCV: 83.5 fL (ref 80.0–100.0)
MONOS PCT: 9 %
Monocytes Absolute: 0.2 10*3/uL (ref 0.2–0.9)
Neutro Abs: 1.2 10*3/uL — ABNORMAL LOW (ref 1.4–6.5)
Neutrophils Relative %: 47 %
PLATELETS: 177 10*3/uL (ref 150–440)
RBC: 4.33 MIL/uL (ref 3.80–5.20)
RDW: 15.7 % — ABNORMAL HIGH (ref 11.5–14.5)
WBC: 2.6 10*3/uL — AB (ref 3.6–11.0)

## 2015-10-09 LAB — IRON AND TIBC
IRON: 38 ug/dL (ref 28–170)
Saturation Ratios: 12 % (ref 10.4–31.8)
TIBC: 328 ug/dL (ref 250–450)
UIBC: 290 ug/dL

## 2015-10-29 ENCOUNTER — Other Ambulatory Visit: Payer: Self-pay | Admitting: *Deleted

## 2015-10-29 ENCOUNTER — Telehealth: Payer: Self-pay | Admitting: *Deleted

## 2015-10-29 DIAGNOSIS — D509 Iron deficiency anemia, unspecified: Secondary | ICD-10-CM

## 2015-10-29 NOTE — Telephone Encounter (Signed)
Spoke with patient. Instructions provided to come to cancer center within the next few days to pick up stool cards. She is unable to confirm an appointment time to pick up stool cards and will let us know what day is best for her.  I instructed her to take otc iron tablets twice daily.  Pt states her EGD was on 06/08/15 by Dr. Candace Cruise and last colonoscopy was in about 2009.  She denies any bleeding at this time. She asked that we hold off on a referral to gi until stool cards are collected. She "rather not get another scope at this time."

## 2015-10-29 NOTE — Telephone Encounter (Signed)
-----   Message from Cammie Sickle, MD sent at 10/29/2015  8:04 AM EDT ----- Please inform pt that she is low on iron-check stool occult x2; check with pt re: last colonoscopy/if EGD done and also recommend PO iron BID.

## 2015-11-04 DIAGNOSIS — F332 Major depressive disorder, recurrent severe without psychotic features: Secondary | ICD-10-CM | POA: Diagnosis not present

## 2015-11-05 ENCOUNTER — Other Ambulatory Visit: Payer: Self-pay

## 2015-11-05 DIAGNOSIS — L439 Lichen planus, unspecified: Secondary | ICD-10-CM | POA: Diagnosis not present

## 2015-11-05 DIAGNOSIS — Z794 Long term (current) use of insulin: Secondary | ICD-10-CM | POA: Diagnosis not present

## 2015-11-05 DIAGNOSIS — B372 Candidiasis of skin and nail: Secondary | ICD-10-CM | POA: Diagnosis not present

## 2015-11-05 DIAGNOSIS — Z87448 Personal history of other diseases of urinary system: Secondary | ICD-10-CM | POA: Diagnosis not present

## 2015-11-05 DIAGNOSIS — E1165 Type 2 diabetes mellitus with hyperglycemia: Secondary | ICD-10-CM | POA: Diagnosis not present

## 2015-11-05 DIAGNOSIS — L304 Erythema intertrigo: Secondary | ICD-10-CM | POA: Diagnosis not present

## 2015-11-05 DIAGNOSIS — E039 Hypothyroidism, unspecified: Secondary | ICD-10-CM | POA: Diagnosis not present

## 2015-11-11 DIAGNOSIS — F332 Major depressive disorder, recurrent severe without psychotic features: Secondary | ICD-10-CM | POA: Diagnosis not present

## 2015-11-12 DIAGNOSIS — N183 Chronic kidney disease, stage 3 (moderate): Secondary | ICD-10-CM | POA: Diagnosis not present

## 2015-11-12 DIAGNOSIS — Z794 Long term (current) use of insulin: Secondary | ICD-10-CM | POA: Diagnosis not present

## 2015-11-12 DIAGNOSIS — E1122 Type 2 diabetes mellitus with diabetic chronic kidney disease: Secondary | ICD-10-CM | POA: Diagnosis not present

## 2015-11-12 DIAGNOSIS — E1165 Type 2 diabetes mellitus with hyperglycemia: Secondary | ICD-10-CM | POA: Diagnosis not present

## 2015-11-12 DIAGNOSIS — Z9189 Other specified personal risk factors, not elsewhere classified: Secondary | ICD-10-CM | POA: Diagnosis not present

## 2015-11-12 DIAGNOSIS — E039 Hypothyroidism, unspecified: Secondary | ICD-10-CM | POA: Diagnosis not present

## 2015-11-19 DIAGNOSIS — F332 Major depressive disorder, recurrent severe without psychotic features: Secondary | ICD-10-CM | POA: Diagnosis not present

## 2015-11-27 ENCOUNTER — Other Ambulatory Visit: Payer: Self-pay | Admitting: *Deleted

## 2015-11-27 DIAGNOSIS — D509 Iron deficiency anemia, unspecified: Secondary | ICD-10-CM

## 2015-11-27 LAB — OCCULT BLOOD X 1 CARD TO LAB, STOOL
FECAL OCCULT BLD: NEGATIVE
FECAL OCCULT BLD: NEGATIVE

## 2015-11-30 DIAGNOSIS — Z23 Encounter for immunization: Secondary | ICD-10-CM | POA: Diagnosis not present

## 2015-11-30 DIAGNOSIS — E119 Type 2 diabetes mellitus without complications: Secondary | ICD-10-CM | POA: Diagnosis not present

## 2015-12-02 DIAGNOSIS — E1122 Type 2 diabetes mellitus with diabetic chronic kidney disease: Secondary | ICD-10-CM | POA: Diagnosis not present

## 2015-12-02 DIAGNOSIS — Z794 Long term (current) use of insulin: Secondary | ICD-10-CM | POA: Diagnosis not present

## 2015-12-02 DIAGNOSIS — N183 Chronic kidney disease, stage 3 (moderate): Secondary | ICD-10-CM | POA: Diagnosis not present

## 2015-12-02 DIAGNOSIS — E1169 Type 2 diabetes mellitus with other specified complication: Secondary | ICD-10-CM | POA: Diagnosis not present

## 2015-12-02 DIAGNOSIS — E785 Hyperlipidemia, unspecified: Secondary | ICD-10-CM | POA: Diagnosis not present

## 2015-12-03 DIAGNOSIS — F332 Major depressive disorder, recurrent severe without psychotic features: Secondary | ICD-10-CM | POA: Diagnosis not present

## 2015-12-09 DIAGNOSIS — Z0000 Encounter for general adult medical examination without abnormal findings: Secondary | ICD-10-CM | POA: Diagnosis not present

## 2015-12-09 DIAGNOSIS — Z794 Long term (current) use of insulin: Secondary | ICD-10-CM | POA: Diagnosis not present

## 2015-12-09 DIAGNOSIS — N183 Chronic kidney disease, stage 3 (moderate): Secondary | ICD-10-CM | POA: Diagnosis not present

## 2015-12-09 DIAGNOSIS — E1122 Type 2 diabetes mellitus with diabetic chronic kidney disease: Secondary | ICD-10-CM | POA: Diagnosis not present

## 2015-12-09 DIAGNOSIS — F325 Major depressive disorder, single episode, in full remission: Secondary | ICD-10-CM | POA: Diagnosis not present

## 2015-12-09 DIAGNOSIS — I10 Essential (primary) hypertension: Secondary | ICD-10-CM | POA: Diagnosis not present

## 2015-12-09 DIAGNOSIS — E1169 Type 2 diabetes mellitus with other specified complication: Secondary | ICD-10-CM | POA: Diagnosis not present

## 2015-12-09 DIAGNOSIS — Z9989 Dependence on other enabling machines and devices: Secondary | ICD-10-CM | POA: Diagnosis not present

## 2015-12-09 DIAGNOSIS — G4733 Obstructive sleep apnea (adult) (pediatric): Secondary | ICD-10-CM | POA: Diagnosis not present

## 2015-12-09 DIAGNOSIS — E039 Hypothyroidism, unspecified: Secondary | ICD-10-CM | POA: Diagnosis not present

## 2015-12-09 DIAGNOSIS — E785 Hyperlipidemia, unspecified: Secondary | ICD-10-CM | POA: Diagnosis not present

## 2015-12-24 DIAGNOSIS — Z23 Encounter for immunization: Secondary | ICD-10-CM | POA: Diagnosis not present

## 2015-12-30 ENCOUNTER — Inpatient Hospital Stay: Payer: Medicare Other | Attending: Internal Medicine

## 2015-12-30 DIAGNOSIS — Z79899 Other long term (current) drug therapy: Secondary | ICD-10-CM | POA: Insufficient documentation

## 2015-12-30 DIAGNOSIS — D472 Monoclonal gammopathy: Secondary | ICD-10-CM | POA: Diagnosis not present

## 2015-12-30 DIAGNOSIS — E1122 Type 2 diabetes mellitus with diabetic chronic kidney disease: Secondary | ICD-10-CM | POA: Insufficient documentation

## 2015-12-30 DIAGNOSIS — N189 Chronic kidney disease, unspecified: Secondary | ICD-10-CM | POA: Insufficient documentation

## 2015-12-30 DIAGNOSIS — Z794 Long term (current) use of insulin: Secondary | ICD-10-CM | POA: Diagnosis not present

## 2015-12-30 DIAGNOSIS — F329 Major depressive disorder, single episode, unspecified: Secondary | ICD-10-CM | POA: Diagnosis not present

## 2015-12-30 DIAGNOSIS — K219 Gastro-esophageal reflux disease without esophagitis: Secondary | ICD-10-CM | POA: Diagnosis not present

## 2015-12-30 DIAGNOSIS — G4730 Sleep apnea, unspecified: Secondary | ICD-10-CM | POA: Insufficient documentation

## 2015-12-30 DIAGNOSIS — Z87891 Personal history of nicotine dependence: Secondary | ICD-10-CM | POA: Insufficient documentation

## 2015-12-30 DIAGNOSIS — D72819 Decreased white blood cell count, unspecified: Secondary | ICD-10-CM | POA: Insufficient documentation

## 2015-12-30 DIAGNOSIS — E039 Hypothyroidism, unspecified: Secondary | ICD-10-CM | POA: Diagnosis not present

## 2015-12-30 DIAGNOSIS — I129 Hypertensive chronic kidney disease with stage 1 through stage 4 chronic kidney disease, or unspecified chronic kidney disease: Secondary | ICD-10-CM | POA: Insufficient documentation

## 2015-12-30 LAB — COMPREHENSIVE METABOLIC PANEL
ALBUMIN: 4 g/dL (ref 3.5–5.0)
ALK PHOS: 80 U/L (ref 38–126)
ALT: 15 U/L (ref 14–54)
ANION GAP: 9 (ref 5–15)
AST: 26 U/L (ref 15–41)
BUN: 19 mg/dL (ref 6–20)
CALCIUM: 9.5 mg/dL (ref 8.9–10.3)
CHLORIDE: 99 mmol/L — AB (ref 101–111)
CO2: 27 mmol/L (ref 22–32)
Creatinine, Ser: 1.34 mg/dL — ABNORMAL HIGH (ref 0.44–1.00)
GFR calc non Af Amer: 39 mL/min — ABNORMAL LOW (ref >60)
GFR, EST AFRICAN AMERICAN: 45 mL/min — AB (ref >60)
GLUCOSE: 160 mg/dL — AB (ref 65–99)
Potassium: 4.1 mmol/L (ref 3.5–5.1)
SODIUM: 135 mmol/L (ref 135–145)
Total Bilirubin: 0.6 mg/dL (ref 0.3–1.2)
Total Protein: 7.1 g/dL (ref 6.5–8.1)

## 2015-12-30 LAB — CBC WITH DIFFERENTIAL/PLATELET
BASOS PCT: 1 %
Basophils Absolute: 0 10*3/uL (ref 0–0.1)
EOS ABS: 0.2 10*3/uL (ref 0–0.7)
EOS PCT: 5 %
HCT: 40.2 % (ref 35.0–47.0)
HEMOGLOBIN: 13.4 g/dL (ref 12.0–16.0)
Lymphocytes Relative: 28 %
Lymphs Abs: 1.1 10*3/uL (ref 1.0–3.6)
MCH: 27.9 pg (ref 26.0–34.0)
MCHC: 33.4 g/dL (ref 32.0–36.0)
MCV: 83.7 fL (ref 80.0–100.0)
MONOS PCT: 9 %
Monocytes Absolute: 0.3 10*3/uL (ref 0.2–0.9)
NEUTROS PCT: 57 %
Neutro Abs: 2.2 10*3/uL (ref 1.4–6.5)
PLATELETS: 161 10*3/uL (ref 150–440)
RBC: 4.8 MIL/uL (ref 3.80–5.20)
RDW: 15.5 % — ABNORMAL HIGH (ref 11.5–14.5)
WBC: 3.8 10*3/uL (ref 3.6–11.0)

## 2015-12-31 LAB — KAPPA/LAMBDA LIGHT CHAINS
KAPPA FREE LGHT CHN: 28.8 mg/L — AB (ref 3.3–19.4)
KAPPA, LAMDA LIGHT CHAIN RATIO: 1.21 (ref 0.26–1.65)
Lambda free light chains: 23.8 mg/L (ref 5.7–26.3)

## 2016-01-01 DIAGNOSIS — F332 Major depressive disorder, recurrent severe without psychotic features: Secondary | ICD-10-CM | POA: Diagnosis not present

## 2016-01-04 LAB — MULTIPLE MYELOMA PANEL, SERUM
ALBUMIN/GLOB SERPL: 1.1 (ref 0.7–1.7)
ALPHA 1: 0.2 g/dL (ref 0.0–0.4)
Albumin SerPl Elph-Mcnc: 3.4 g/dL (ref 2.9–4.4)
Alpha2 Glob SerPl Elph-Mcnc: 0.9 g/dL (ref 0.4–1.0)
B-GLOBULIN SERPL ELPH-MCNC: 1 g/dL (ref 0.7–1.3)
GAMMA GLOB SERPL ELPH-MCNC: 1 g/dL (ref 0.4–1.8)
GLOBULIN, TOTAL: 3.1 g/dL (ref 2.2–3.9)
IGA: 139 mg/dL (ref 64–422)
IgG (Immunoglobin G), Serum: 942 mg/dL (ref 700–1600)
IgM, Serum: 79 mg/dL (ref 26–217)
M Protein SerPl Elph-Mcnc: 0.4 g/dL — ABNORMAL HIGH
Total Protein ELP: 6.5 g/dL (ref 6.0–8.5)

## 2016-01-06 ENCOUNTER — Inpatient Hospital Stay (HOSPITAL_BASED_OUTPATIENT_CLINIC_OR_DEPARTMENT_OTHER): Payer: Medicare Other | Admitting: Internal Medicine

## 2016-01-06 VITALS — BP 168/81 | HR 84 | Temp 97.8°F | Resp 18 | Wt 177.2 lb

## 2016-01-06 DIAGNOSIS — D472 Monoclonal gammopathy: Secondary | ICD-10-CM | POA: Diagnosis not present

## 2016-01-06 DIAGNOSIS — I129 Hypertensive chronic kidney disease with stage 1 through stage 4 chronic kidney disease, or unspecified chronic kidney disease: Secondary | ICD-10-CM | POA: Diagnosis not present

## 2016-01-06 DIAGNOSIS — N189 Chronic kidney disease, unspecified: Secondary | ICD-10-CM | POA: Diagnosis not present

## 2016-01-06 DIAGNOSIS — D72819 Decreased white blood cell count, unspecified: Secondary | ICD-10-CM | POA: Diagnosis not present

## 2016-01-06 DIAGNOSIS — E1122 Type 2 diabetes mellitus with diabetic chronic kidney disease: Secondary | ICD-10-CM | POA: Diagnosis not present

## 2016-01-06 DIAGNOSIS — Z79899 Other long term (current) drug therapy: Secondary | ICD-10-CM

## 2016-01-06 NOTE — Progress Notes (Signed)
[place] OFFICE PROGRESS NOTE  Patient Care Team: Kirk Ruths, MD as PCP - General (Internal Medicine)  No matching staging information was found for the patient.    No history exists.    # MGUS M protein 0.5 g bone marrow biopsy October 2016 [Dr.Pandit]- no myeloma cells/? Hemodilution.  # CKD [creat- 1.4; Dr.Kolluru]    INTERVAL HISTORY:  Abigail Walker 70 y.o.  female pleasant patient above history of MGUS is here for follow-up.  Patient is sad that her daughter died of unknown kind of cancer early in the year.  Patient denies any nausea vomiting. Denies any abnormal tingling and numbness. Denies any chest pain or cough. No swelling in the legs.  REVIEW OF SYSTEMS:  A complete 10 point review of system is done which is negative except mentioned above/history of present illness.   PAST MEDICAL HISTORY :  Past Medical History:  Diagnosis Date  . Acid reflux 06/16/2013  . Adult hypothyroidism 06/16/2013   Last Assessment & Plan:  Tsh and energy have been stable.    . Benign hypertension 06/16/2013   Last Assessment & Plan:  Patients blood pressure has seemingly been controlled without significant side effects such as dizziness or slow heart rate.     . Cancer ([place]) skin  . Chronic kidney disease   . Depression   . Diabetes mellitus without complication ([place])   . GERD (gastroesophageal reflux disease)   . HBV (hepatitis B virus) infection   . High blood pressure   . Hives    food related  . Hypothyroidism   . Mixed incontinence   . Obstructive apnea 06/16/2013  . Sleep apnea   . Thyroid disease     PAST SURGICAL HISTORY :   Past Surgical History:  Procedure Laterality Date  . CATARACT EXTRACTION Bilateral   . ESOPHAGOGASTRODUODENOSCOPY (EGD) WITH PROPOFOL N/A 06/08/2015   Procedure: ESOPHAGOGASTRODUODENOSCOPY (EGD) WITH PROPOFOL;  Surgeon: Hulen Luster, MD;  Location[facility] END[facility]COPY;  Service: Gastroenterology;  Laterality: N/A;  . HEMORRHOID  SURGERY    . IMPLANTABLE CONTACT LENS IMPLANTATION    . TONSILLECTOMY    . TUBAL LIGATION      FAMILY HISTORY :   Family History  Problem Relation Age of Onset  . Heart disease Father   . Cancer Mother   . Kidney failure Maternal Aunt     SOCIAL HISTORY:   Social History  Substance Use Topics  . Smoking status: Former Smoker    Quit date: 07/27/1993  . Smokeless tobacco: Not on file     Comment: quit 20 years  . Alcohol use No    ALLERGIES:  is allergic to sulfa antibiotics and amoxicillin-pot clavulanate.  MEDICATIONS:  Current Outpatient Prescriptions  Medication Sig Dispense Refill  . acetaminophen (TYLENOL) 500 MG tablet Take 500 mg by mouth every 6 (six) hours as needed for moderate pain.    Marland Kitchen azelastine (ASTELIN) 0.1 % nasal spray Place into the nose.    Marland Kitchen buPROPion (WELLBUTRIN XL) 150 MG 24 hr tablet Take 3 tablet po QD as directed    . calcitRIOL (ROCALTROL) 0.25 MCG capsule     . Cholecalciferol (VITAMIN D3) 2000 UNITS capsule Take by mouth.    . clobetasol cream (TEMOVATE) 0.05 % Apply topically.    . clotrimazole (MYCELEX) 10 MG troche Take 10 mg by mouth 5 (five) times daily. Reported on 07/08/2015    . ferrous sulfate 325 (65 FE) MG tablet Take 325 mg  by mouth daily with breakfast.    . insulin NPH-regular Human (NOVOLIN 70/30) (70-30) 100 UNIT/ML injection Inject 16 units before breakfast and 26 units before supper as directed    . Insulin Syringe-Needle U-100 (INSULIN SYRINGE 1CC/31GX5/16") 31G X 5/16" 1 ML MISC USE ONE SYRINGE TWICE DAILY AS DIRECTED WITH INSULIN VIALS    . levothyroxine (SYNTHROID, LEVOTHROID) 75 MCG tablet TAKE ONE TABLET BY MOUTH ONCE DAILY    . Multiple Vitamins-Minerals (HAIR SKIN AND NAILS FORMULA PO) Take by mouth.    . pantoprazole (PROTONIX) 40 MG tablet Take by mouth.    Marland Kitchen PARoxetine (PAXIL) 40 MG tablet Take by mouth.    . pravastatin (PRAVACHOL) 80 MG tablet     . tacrolimus (PROGRAF) 1 MG capsule Take by mouth.    . torsemide  (DEMADEX) 10 MG tablet Take by mouth.    . fluticasone (FLONASE) 50 MCG/ACT nasal spray Place into the nose.     No current facility-administered medications for this visit.     PHYSICAL EXAMINATION: ECOG PERFORMANCE STATUS: 0 - Asymptomatic  BP (!) 168/81 (BP Location: Left Arm, Patient Position: Sitting)   Pulse 84   Temp 97.8 F (36.6 C) (Tympanic)   Resp 18   Wt 177 lb 4 oz (80.4 kg)   BMI 32.95 kg/m   Filed Weights   01/06/16 1455  Weight: 177 lb 4 oz (80.4 kg)    GENERAL: Well-nourished well-developed; Alert, no distress and comfortable.alone EYES: no pallor or icterus OROPHARYNX: no thrush or ulceration; poor dentition  NECK: supple, no masses felt LYMPH:  no palpable lymphadenopathy in the cervical, axillary or inguinal regions LUNGS: clear to auscultation and  No wheeze or crackles HEART/CVS: regular rate & rhythm and no murmurs; No lower extremity edema ABDOMEN:abdomen soft, non-tender and normal bowel sounds Musculoskeletal:no cyanosis of digits and no clubbing  PSYCH: alert & oriented x 3 with fluent speech NEURO: no focal motor/sensory deficits SKIN:  no rashes or significant lesions  LABORATORY DATA:  I have reviewed the data as listed    Component Value Date/Time   NA 135 12/30/2015 1400   NA 140 05/19/2011 0409   K 4.1 12/30/2015 1400   K 4.2 05/19/2011 0409   CL 99 (L) 12/30/2015 1400   CL 105 05/19/2011 0409   CO2 27 12/30/2015 1400   CO2 28 05/19/2011 0409   GLUCOSE 160 (H) 12/30/2015 1400   GLUCOSE 158 (H) 05/19/2011 0409   BUN 19 12/30/2015 1400   BUN 15 05/19/2011 0409   CREATININE 1.34 (H) 12/30/2015 1400   CREATININE 1.18 05/19/2011 0409   CALCIUM 9.5 12/30/2015 1400   CALCIUM 9.2 05/19/2011 0409   PROT 7.1 12/30/2015 1400   PROT 7.1 05/14/2011 1432   ALBUMIN 4.0 12/30/2015 1400   ALBUMIN 3.2 (L) 05/14/2011 1432   AST 26 12/30/2015 1400   AST 20 05/14/2011 1432   ALT 15 12/30/2015 1400   ALT 20 05/14/2011 1432   ALKPHOS 80  12/30/2015 1400   ALKPHOS 88 05/14/2011 1432   BILITOT 0.6 12/30/2015 1400   BILITOT 0.3 05/14/2011 1432   GFRNONAA 39 (L) 12/30/2015 1400   GFRNONAA 48 (L) 05/19/2011 0409   GFRAA 45 (L) 12/30/2015 1400   GFRAA 56 (L) 05/19/2011 0409    No results found for: SPEP, UPEP  Lab Results  Component Value Date   WBC 3.8 12/30/2015   NEUTROABS 2.2 12/30/2015   HGB 13.4 12/30/2015   HCT 40.2 12/30/2015   MCV 83.7 12/30/2015  PLT 161 12/30/2015      Chemistry      Component Value Date/Time   NA 135 12/30/2015 1400   NA 140 05/19/2011 0409   K 4.1 12/30/2015 1400   K 4.2 05/19/2011 0409   CL 99 (L) 12/30/2015 1400   CL 105 05/19/2011 0409   CO2 27 12/30/2015 1400   CO2 28 05/19/2011 0409   BUN 19 12/30/2015 1400   BUN 15 05/19/2011 0409   CREATININE 1.34 (H) 12/30/2015 1400   CREATININE 1.18 05/19/2011 0409      Component Value Date/Time   CALCIUM 9.5 12/30/2015 1400   CALCIUM 9.2 05/19/2011 0409   ALKPHOS 80 12/30/2015 1400   ALKPHOS 88 05/14/2011 1432   AST 26 12/30/2015 1400   AST 20 05/14/2011 1432   ALT 15 12/30/2015 1400   ALT 20 05/14/2011 1432   BILITOT 0.6 12/30/2015 1400   BILITOT 0.3 05/14/2011 1432       RADIOGRAPHIC STUDIES: I have personally reviewed the radiological images as listed and agreed with the findings in the report. No results found.   ASSESSMENT & PLAN:  MGUS (monoclonal gammopathy of unknown significance) Monoclonal gammopathy of unknown significance; IgG Lamda- December 20 17-0.4 g/dL. No clinical signs of progression. Discussed the risk is fairly small.  # Mild leukopenia white count of 2.6 ANC 1.1- June 2017;  Today improved; normal. Monitor for now.  Recommend follow-up in 12 months with CBC CMP myeloma workup 1 week prior.   Orders Placed This Encounter  Procedures  . Comprehensive metabolic panel    Standing Status:   Future    Standing Expiration Date:   07/06/2017  . CBC with Differential    Standing Status:   Future     Standing Expiration Date:   07/06/2017  . Kappa/lambda light chains    Standing Status:   Future    Standing Expiration Date:   07/06/2017  . Multiple Myeloma Panel (SPEP&IFE w/QIG)    Standing Status:   Future    Standing Expiration Date:   07/06/2017   All questions were answered. The patient knows to call the clinic with any problems, questions or concerns.      Cammie Sickle, MD 01/06/2016 5:11 PM

## 2016-01-06 NOTE — Progress Notes (Signed)
Patient states PCP took her off of BP meds.  BP today 168/81  HR 84.

## 2016-01-06 NOTE — Assessment & Plan Note (Addendum)
Monoclonal gammopathy of unknown significance; IgG Lamda- December 20 17-0.4 g/dL. No clinical signs of progression. Discussed the risk is fairly small.  # Mild leukopenia white count of 2.6 ANC 1.1- June 2017;  Today improved; normal. Monitor for now.  Recommend follow-up in 12 months with CBC CMP myeloma workup 1 week prior.

## 2016-01-19 DIAGNOSIS — F331 Major depressive disorder, recurrent, moderate: Secondary | ICD-10-CM | POA: Diagnosis not present

## 2016-01-25 DIAGNOSIS — R809 Proteinuria, unspecified: Secondary | ICD-10-CM | POA: Diagnosis not present

## 2016-01-25 DIAGNOSIS — N183 Chronic kidney disease, stage 3 (moderate): Secondary | ICD-10-CM | POA: Diagnosis not present

## 2016-01-25 DIAGNOSIS — I129 Hypertensive chronic kidney disease with stage 1 through stage 4 chronic kidney disease, or unspecified chronic kidney disease: Secondary | ICD-10-CM | POA: Diagnosis not present

## 2016-01-25 DIAGNOSIS — E1122 Type 2 diabetes mellitus with diabetic chronic kidney disease: Secondary | ICD-10-CM | POA: Diagnosis not present

## 2016-04-19 DIAGNOSIS — E1165 Type 2 diabetes mellitus with hyperglycemia: Secondary | ICD-10-CM | POA: Diagnosis not present

## 2016-04-19 DIAGNOSIS — Z794 Long term (current) use of insulin: Secondary | ICD-10-CM | POA: Diagnosis not present

## 2016-04-26 DIAGNOSIS — Z9189 Other specified personal risk factors, not elsewhere classified: Secondary | ICD-10-CM | POA: Diagnosis not present

## 2016-04-26 DIAGNOSIS — E1122 Type 2 diabetes mellitus with diabetic chronic kidney disease: Secondary | ICD-10-CM | POA: Diagnosis not present

## 2016-04-26 DIAGNOSIS — N183 Chronic kidney disease, stage 3 (moderate): Secondary | ICD-10-CM | POA: Diagnosis not present

## 2016-04-26 DIAGNOSIS — E1165 Type 2 diabetes mellitus with hyperglycemia: Secondary | ICD-10-CM | POA: Diagnosis not present

## 2016-04-26 DIAGNOSIS — E039 Hypothyroidism, unspecified: Secondary | ICD-10-CM | POA: Diagnosis not present

## 2016-04-26 DIAGNOSIS — Z794 Long term (current) use of insulin: Secondary | ICD-10-CM | POA: Diagnosis not present

## 2016-05-11 DIAGNOSIS — Z85828 Personal history of other malignant neoplasm of skin: Secondary | ICD-10-CM | POA: Diagnosis not present

## 2016-05-11 DIAGNOSIS — L900 Lichen sclerosus et atrophicus: Secondary | ICD-10-CM | POA: Diagnosis not present

## 2016-05-11 DIAGNOSIS — L720 Epidermal cyst: Secondary | ICD-10-CM | POA: Diagnosis not present

## 2016-05-11 DIAGNOSIS — L439 Lichen planus, unspecified: Secondary | ICD-10-CM | POA: Diagnosis not present

## 2016-05-31 DIAGNOSIS — E1169 Type 2 diabetes mellitus with other specified complication: Secondary | ICD-10-CM | POA: Diagnosis not present

## 2016-05-31 DIAGNOSIS — E1122 Type 2 diabetes mellitus with diabetic chronic kidney disease: Secondary | ICD-10-CM | POA: Diagnosis not present

## 2016-05-31 DIAGNOSIS — N183 Chronic kidney disease, stage 3 (moderate): Secondary | ICD-10-CM | POA: Diagnosis not present

## 2016-05-31 DIAGNOSIS — Z794 Long term (current) use of insulin: Secondary | ICD-10-CM | POA: Diagnosis not present

## 2016-05-31 DIAGNOSIS — E785 Hyperlipidemia, unspecified: Secondary | ICD-10-CM | POA: Diagnosis not present

## 2016-05-31 DIAGNOSIS — I10 Essential (primary) hypertension: Secondary | ICD-10-CM | POA: Diagnosis not present

## 2016-05-31 DIAGNOSIS — E039 Hypothyroidism, unspecified: Secondary | ICD-10-CM | POA: Diagnosis not present

## 2016-06-07 DIAGNOSIS — E785 Hyperlipidemia, unspecified: Secondary | ICD-10-CM | POA: Diagnosis not present

## 2016-06-07 DIAGNOSIS — I10 Essential (primary) hypertension: Secondary | ICD-10-CM | POA: Diagnosis not present

## 2016-06-07 DIAGNOSIS — N183 Chronic kidney disease, stage 3 (moderate): Secondary | ICD-10-CM | POA: Diagnosis not present

## 2016-06-07 DIAGNOSIS — E1169 Type 2 diabetes mellitus with other specified complication: Secondary | ICD-10-CM | POA: Diagnosis not present

## 2016-06-07 DIAGNOSIS — Z9989 Dependence on other enabling machines and devices: Secondary | ICD-10-CM | POA: Diagnosis not present

## 2016-06-07 DIAGNOSIS — G4733 Obstructive sleep apnea (adult) (pediatric): Secondary | ICD-10-CM | POA: Diagnosis not present

## 2016-06-07 DIAGNOSIS — E1122 Type 2 diabetes mellitus with diabetic chronic kidney disease: Secondary | ICD-10-CM | POA: Diagnosis not present

## 2016-06-07 DIAGNOSIS — E039 Hypothyroidism, unspecified: Secondary | ICD-10-CM | POA: Diagnosis not present

## 2016-06-07 DIAGNOSIS — F325 Major depressive disorder, single episode, in full remission: Secondary | ICD-10-CM | POA: Diagnosis not present

## 2016-06-07 DIAGNOSIS — Z794 Long term (current) use of insulin: Secondary | ICD-10-CM | POA: Diagnosis not present

## 2016-07-19 DIAGNOSIS — D631 Anemia in chronic kidney disease: Secondary | ICD-10-CM | POA: Diagnosis not present

## 2016-07-19 DIAGNOSIS — N2581 Secondary hyperparathyroidism of renal origin: Secondary | ICD-10-CM | POA: Diagnosis not present

## 2016-07-19 DIAGNOSIS — N183 Chronic kidney disease, stage 3 (moderate): Secondary | ICD-10-CM | POA: Diagnosis not present

## 2016-07-19 DIAGNOSIS — R809 Proteinuria, unspecified: Secondary | ICD-10-CM | POA: Diagnosis not present

## 2016-07-19 DIAGNOSIS — I10 Essential (primary) hypertension: Secondary | ICD-10-CM | POA: Diagnosis not present

## 2016-07-19 DIAGNOSIS — E1122 Type 2 diabetes mellitus with diabetic chronic kidney disease: Secondary | ICD-10-CM | POA: Diagnosis not present

## 2016-07-19 DIAGNOSIS — E875 Hyperkalemia: Secondary | ICD-10-CM | POA: Diagnosis not present

## 2016-07-25 DIAGNOSIS — N183 Chronic kidney disease, stage 3 (moderate): Secondary | ICD-10-CM | POA: Diagnosis not present

## 2016-07-25 DIAGNOSIS — I129 Hypertensive chronic kidney disease with stage 1 through stage 4 chronic kidney disease, or unspecified chronic kidney disease: Secondary | ICD-10-CM | POA: Diagnosis not present

## 2016-07-25 DIAGNOSIS — N2581 Secondary hyperparathyroidism of renal origin: Secondary | ICD-10-CM | POA: Diagnosis not present

## 2016-07-25 DIAGNOSIS — E1122 Type 2 diabetes mellitus with diabetic chronic kidney disease: Secondary | ICD-10-CM | POA: Diagnosis not present

## 2016-08-02 DIAGNOSIS — E1165 Type 2 diabetes mellitus with hyperglycemia: Secondary | ICD-10-CM | POA: Diagnosis not present

## 2016-08-02 DIAGNOSIS — E1122 Type 2 diabetes mellitus with diabetic chronic kidney disease: Secondary | ICD-10-CM | POA: Diagnosis not present

## 2016-08-02 DIAGNOSIS — Z794 Long term (current) use of insulin: Secondary | ICD-10-CM | POA: Diagnosis not present

## 2016-08-02 DIAGNOSIS — Z9189 Other specified personal risk factors, not elsewhere classified: Secondary | ICD-10-CM | POA: Diagnosis not present

## 2016-08-02 DIAGNOSIS — N183 Chronic kidney disease, stage 3 (moderate): Secondary | ICD-10-CM | POA: Diagnosis not present

## 2016-08-02 DIAGNOSIS — E039 Hypothyroidism, unspecified: Secondary | ICD-10-CM | POA: Diagnosis not present

## 2016-08-15 DIAGNOSIS — F331 Major depressive disorder, recurrent, moderate: Secondary | ICD-10-CM | POA: Diagnosis not present

## 2016-08-15 DIAGNOSIS — Z79899 Other long term (current) drug therapy: Secondary | ICD-10-CM | POA: Diagnosis not present

## 2016-08-22 DIAGNOSIS — E785 Hyperlipidemia, unspecified: Secondary | ICD-10-CM | POA: Diagnosis not present

## 2016-08-22 DIAGNOSIS — N183 Chronic kidney disease, stage 3 (moderate): Secondary | ICD-10-CM | POA: Diagnosis not present

## 2016-08-22 DIAGNOSIS — E1122 Type 2 diabetes mellitus with diabetic chronic kidney disease: Secondary | ICD-10-CM | POA: Diagnosis not present

## 2016-08-22 DIAGNOSIS — N2581 Secondary hyperparathyroidism of renal origin: Secondary | ICD-10-CM | POA: Diagnosis not present

## 2016-08-22 DIAGNOSIS — R809 Proteinuria, unspecified: Secondary | ICD-10-CM | POA: Diagnosis not present

## 2016-08-22 DIAGNOSIS — D631 Anemia in chronic kidney disease: Secondary | ICD-10-CM | POA: Diagnosis not present

## 2016-08-22 DIAGNOSIS — I10 Essential (primary) hypertension: Secondary | ICD-10-CM | POA: Diagnosis not present

## 2016-10-27 DIAGNOSIS — Z794 Long term (current) use of insulin: Secondary | ICD-10-CM | POA: Diagnosis not present

## 2016-10-27 DIAGNOSIS — E1165 Type 2 diabetes mellitus with hyperglycemia: Secondary | ICD-10-CM | POA: Diagnosis not present

## 2016-11-03 DIAGNOSIS — Z9189 Other specified personal risk factors, not elsewhere classified: Secondary | ICD-10-CM | POA: Diagnosis not present

## 2016-11-03 DIAGNOSIS — E1165 Type 2 diabetes mellitus with hyperglycemia: Secondary | ICD-10-CM | POA: Diagnosis not present

## 2016-11-03 DIAGNOSIS — Z794 Long term (current) use of insulin: Secondary | ICD-10-CM | POA: Diagnosis not present

## 2016-11-03 DIAGNOSIS — N183 Chronic kidney disease, stage 3 (moderate): Secondary | ICD-10-CM | POA: Diagnosis not present

## 2016-11-03 DIAGNOSIS — Z6837 Body mass index (BMI) 37.0-37.9, adult: Secondary | ICD-10-CM | POA: Diagnosis not present

## 2016-11-03 DIAGNOSIS — E039 Hypothyroidism, unspecified: Secondary | ICD-10-CM | POA: Diagnosis not present

## 2016-11-03 DIAGNOSIS — E1122 Type 2 diabetes mellitus with diabetic chronic kidney disease: Secondary | ICD-10-CM | POA: Diagnosis not present

## 2016-11-10 DIAGNOSIS — L439 Lichen planus, unspecified: Secondary | ICD-10-CM | POA: Diagnosis not present

## 2016-11-10 DIAGNOSIS — L900 Lichen sclerosus et atrophicus: Secondary | ICD-10-CM | POA: Diagnosis not present

## 2016-11-10 DIAGNOSIS — L304 Erythema intertrigo: Secondary | ICD-10-CM | POA: Diagnosis not present

## 2016-11-28 DIAGNOSIS — E785 Hyperlipidemia, unspecified: Secondary | ICD-10-CM | POA: Diagnosis not present

## 2016-11-28 DIAGNOSIS — E1169 Type 2 diabetes mellitus with other specified complication: Secondary | ICD-10-CM | POA: Diagnosis not present

## 2016-11-28 DIAGNOSIS — I10 Essential (primary) hypertension: Secondary | ICD-10-CM | POA: Diagnosis not present

## 2016-11-28 DIAGNOSIS — E1122 Type 2 diabetes mellitus with diabetic chronic kidney disease: Secondary | ICD-10-CM | POA: Diagnosis not present

## 2016-11-28 DIAGNOSIS — N183 Chronic kidney disease, stage 3 (moderate): Secondary | ICD-10-CM | POA: Diagnosis not present

## 2016-11-28 DIAGNOSIS — Z794 Long term (current) use of insulin: Secondary | ICD-10-CM | POA: Diagnosis not present

## 2016-11-28 DIAGNOSIS — E039 Hypothyroidism, unspecified: Secondary | ICD-10-CM | POA: Diagnosis not present

## 2016-12-05 DIAGNOSIS — Z9989 Dependence on other enabling machines and devices: Secondary | ICD-10-CM | POA: Diagnosis not present

## 2016-12-05 DIAGNOSIS — Z0000 Encounter for general adult medical examination without abnormal findings: Secondary | ICD-10-CM | POA: Diagnosis not present

## 2016-12-05 DIAGNOSIS — E1122 Type 2 diabetes mellitus with diabetic chronic kidney disease: Secondary | ICD-10-CM | POA: Diagnosis not present

## 2016-12-05 DIAGNOSIS — Z794 Long term (current) use of insulin: Secondary | ICD-10-CM | POA: Diagnosis not present

## 2016-12-05 DIAGNOSIS — F325 Major depressive disorder, single episode, in full remission: Secondary | ICD-10-CM | POA: Diagnosis not present

## 2016-12-05 DIAGNOSIS — E785 Hyperlipidemia, unspecified: Secondary | ICD-10-CM | POA: Diagnosis not present

## 2016-12-05 DIAGNOSIS — I10 Essential (primary) hypertension: Secondary | ICD-10-CM | POA: Diagnosis not present

## 2016-12-05 DIAGNOSIS — G4733 Obstructive sleep apnea (adult) (pediatric): Secondary | ICD-10-CM | POA: Diagnosis not present

## 2016-12-05 DIAGNOSIS — E039 Hypothyroidism, unspecified: Secondary | ICD-10-CM | POA: Diagnosis not present

## 2016-12-05 DIAGNOSIS — N183 Chronic kidney disease, stage 3 (moderate): Secondary | ICD-10-CM | POA: Diagnosis not present

## 2016-12-05 DIAGNOSIS — E1169 Type 2 diabetes mellitus with other specified complication: Secondary | ICD-10-CM | POA: Diagnosis not present

## 2016-12-13 DIAGNOSIS — Z23 Encounter for immunization: Secondary | ICD-10-CM | POA: Diagnosis not present

## 2016-12-22 DIAGNOSIS — E119 Type 2 diabetes mellitus without complications: Secondary | ICD-10-CM | POA: Diagnosis not present

## 2016-12-29 ENCOUNTER — Inpatient Hospital Stay: Payer: Medicare Other | Attending: Internal Medicine

## 2016-12-29 DIAGNOSIS — K219 Gastro-esophageal reflux disease without esophagitis: Secondary | ICD-10-CM | POA: Insufficient documentation

## 2016-12-29 DIAGNOSIS — Z87891 Personal history of nicotine dependence: Secondary | ICD-10-CM | POA: Diagnosis not present

## 2016-12-29 DIAGNOSIS — E039 Hypothyroidism, unspecified: Secondary | ICD-10-CM | POA: Insufficient documentation

## 2016-12-29 DIAGNOSIS — F329 Major depressive disorder, single episode, unspecified: Secondary | ICD-10-CM | POA: Insufficient documentation

## 2016-12-29 DIAGNOSIS — G4730 Sleep apnea, unspecified: Secondary | ICD-10-CM | POA: Diagnosis not present

## 2016-12-29 DIAGNOSIS — D72819 Decreased white blood cell count, unspecified: Secondary | ICD-10-CM | POA: Diagnosis not present

## 2016-12-29 DIAGNOSIS — Z794 Long term (current) use of insulin: Secondary | ICD-10-CM | POA: Diagnosis not present

## 2016-12-29 DIAGNOSIS — Z79899 Other long term (current) drug therapy: Secondary | ICD-10-CM | POA: Diagnosis not present

## 2016-12-29 DIAGNOSIS — I129 Hypertensive chronic kidney disease with stage 1 through stage 4 chronic kidney disease, or unspecified chronic kidney disease: Secondary | ICD-10-CM | POA: Insufficient documentation

## 2016-12-29 DIAGNOSIS — E875 Hyperkalemia: Secondary | ICD-10-CM | POA: Insufficient documentation

## 2016-12-29 DIAGNOSIS — E1122 Type 2 diabetes mellitus with diabetic chronic kidney disease: Secondary | ICD-10-CM | POA: Insufficient documentation

## 2016-12-29 DIAGNOSIS — N183 Chronic kidney disease, stage 3 (moderate): Secondary | ICD-10-CM | POA: Diagnosis not present

## 2016-12-29 DIAGNOSIS — D472 Monoclonal gammopathy: Secondary | ICD-10-CM | POA: Diagnosis not present

## 2016-12-29 LAB — CBC WITH DIFFERENTIAL/PLATELET
BASOS PCT: 1 %
Basophils Absolute: 0 10*3/uL (ref 0–0.1)
Eosinophils Absolute: 0.1 10*3/uL (ref 0–0.7)
Eosinophils Relative: 3 %
HCT: 40.3 % (ref 35.0–47.0)
Hemoglobin: 13.4 g/dL (ref 12.0–16.0)
LYMPHS PCT: 42 %
Lymphs Abs: 1.2 10*3/uL (ref 1.0–3.6)
MCH: 29.9 pg (ref 26.0–34.0)
MCHC: 33.2 g/dL (ref 32.0–36.0)
MCV: 90 fL (ref 80.0–100.0)
MONO ABS: 0.3 10*3/uL (ref 0.2–0.9)
Monocytes Relative: 11 %
NEUTROS ABS: 1.2 10*3/uL — AB (ref 1.4–6.5)
Neutrophils Relative %: 43 %
PLATELETS: 177 10*3/uL (ref 150–440)
RBC: 4.47 MIL/uL (ref 3.80–5.20)
RDW: 13.2 % (ref 11.5–14.5)
WBC: 2.9 10*3/uL — ABNORMAL LOW (ref 3.6–11.0)

## 2016-12-29 LAB — COMPREHENSIVE METABOLIC PANEL
ALT: 16 U/L (ref 14–54)
ANION GAP: 8 (ref 5–15)
AST: 27 U/L (ref 15–41)
Albumin: 3.8 g/dL (ref 3.5–5.0)
Alkaline Phosphatase: 74 U/L (ref 38–126)
BILIRUBIN TOTAL: 0.5 mg/dL (ref 0.3–1.2)
BUN: 22 mg/dL — ABNORMAL HIGH (ref 6–20)
CHLORIDE: 99 mmol/L — AB (ref 101–111)
CO2: 27 mmol/L (ref 22–32)
Calcium: 9.8 mg/dL (ref 8.9–10.3)
Creatinine, Ser: 1.52 mg/dL — ABNORMAL HIGH (ref 0.44–1.00)
GFR, EST AFRICAN AMERICAN: 38 mL/min — AB (ref >60)
GFR, EST NON AFRICAN AMERICAN: 33 mL/min — AB (ref >60)
Glucose, Bld: 329 mg/dL — ABNORMAL HIGH (ref 65–99)
POTASSIUM: 5.4 mmol/L — AB (ref 3.5–5.1)
Sodium: 134 mmol/L — ABNORMAL LOW (ref 135–145)
TOTAL PROTEIN: 6.8 g/dL (ref 6.5–8.1)

## 2016-12-30 LAB — KAPPA/LAMBDA LIGHT CHAINS
Kappa free light chain: 29.8 mg/L — ABNORMAL HIGH (ref 3.3–19.4)
Kappa, lambda light chain ratio: 1.65 (ref 0.26–1.65)
LAMDA FREE LIGHT CHAINS: 18.1 mg/L (ref 5.7–26.3)

## 2017-01-02 LAB — MULTIPLE MYELOMA PANEL, SERUM
ALBUMIN SERPL ELPH-MCNC: 3.3 g/dL (ref 2.9–4.4)
ALBUMIN/GLOB SERPL: 1.3 (ref 0.7–1.7)
ALPHA 1: 0.2 g/dL (ref 0.0–0.4)
ALPHA2 GLOB SERPL ELPH-MCNC: 0.8 g/dL (ref 0.4–1.0)
B-Globulin SerPl Elph-Mcnc: 0.9 g/dL (ref 0.7–1.3)
Gamma Glob SerPl Elph-Mcnc: 0.8 g/dL (ref 0.4–1.8)
Globulin, Total: 2.7 g/dL (ref 2.2–3.9)
IGG (IMMUNOGLOBIN G), SERUM: 912 mg/dL (ref 700–1600)
IGM (IMMUNOGLOBULIN M), SRM: 73 mg/dL (ref 26–217)
IgA: 128 mg/dL (ref 64–422)
M Protein SerPl Elph-Mcnc: 0.3 g/dL — ABNORMAL HIGH
TOTAL PROTEIN ELP: 6 g/dL (ref 6.0–8.5)

## 2017-01-06 ENCOUNTER — Encounter: Payer: Self-pay | Admitting: Internal Medicine

## 2017-01-06 ENCOUNTER — Inpatient Hospital Stay (HOSPITAL_BASED_OUTPATIENT_CLINIC_OR_DEPARTMENT_OTHER): Payer: Medicare Other | Admitting: Internal Medicine

## 2017-01-06 ENCOUNTER — Other Ambulatory Visit: Payer: Self-pay

## 2017-01-06 VITALS — BP 157/83 | Temp 98.0°F | Resp 20 | Ht 61.5 in | Wt 178.0 lb

## 2017-01-06 DIAGNOSIS — D472 Monoclonal gammopathy: Secondary | ICD-10-CM | POA: Diagnosis not present

## 2017-01-06 DIAGNOSIS — I129 Hypertensive chronic kidney disease with stage 1 through stage 4 chronic kidney disease, or unspecified chronic kidney disease: Secondary | ICD-10-CM | POA: Diagnosis not present

## 2017-01-06 DIAGNOSIS — Z79899 Other long term (current) drug therapy: Secondary | ICD-10-CM | POA: Diagnosis not present

## 2017-01-06 DIAGNOSIS — E875 Hyperkalemia: Secondary | ICD-10-CM | POA: Diagnosis not present

## 2017-01-06 DIAGNOSIS — E1122 Type 2 diabetes mellitus with diabetic chronic kidney disease: Secondary | ICD-10-CM | POA: Diagnosis not present

## 2017-01-06 DIAGNOSIS — N183 Chronic kidney disease, stage 3 (moderate): Secondary | ICD-10-CM

## 2017-01-06 DIAGNOSIS — D72819 Decreased white blood cell count, unspecified: Secondary | ICD-10-CM

## 2017-01-06 NOTE — Assessment & Plan Note (Addendum)
Monoclonal gammopathy of unknown significance; IgG Lamda- December 2018 -0.3 g/dL. No clinical signs of progression. Discussed the risk is fairly small.  # Mild leukopenia white count of 2.9 ANC 1.2  Today improved; normal. Monitor for now.  Asymptomatic.  If worse would recommend bone marrow biopsy.  # CKD- stage III [creat 1.5]; stable- follows up with Dr.Kolluru  # Hyperkalemia- 5.4; ? Dietary. Declines any repeat blood work up.   Recommend follow-up in 12 months with CBC CMP myeloma workup 1 week prior.

## 2017-01-06 NOTE — Progress Notes (Signed)
[facility] OFFICE PROGRESS NOTE  Patient Care Team: Anderson, Marshall W, MD as PCP - General (Internal Medicine)  No matching staging information was found for the patient.    No history exists.    # MGUS M protein 0.5 g bone marrow biopsy October 2016 [Dr.Pandit]- no myeloma cells/? Hemodilution.  # CKD [creat- 1.4; Dr.Kolluru]    INTERVAL HISTORY:  Abigail Walker 71 y.o.  female pleasant patient above history of MGUS is here for follow-up.  No frequent infections.  No hospitalizations.  No swelling in the legs.  Patient denies any nausea vomiting. Denies any abnormal tingling and numbness. Denies any chest pain or cough. She denies any back pain.   REVIEW OF SYSTEMS:  A complete 10 point review of system is done which is negative except mentioned above/history of present illness.   PAST MEDICAL HISTORY :  Past Medical History:  Diagnosis Date  . Acid reflux 06/16/2013  . Adult hypothyroidism 06/16/2013   Last Assessment & Plan:  Tsh and energy have been stable.    . Benign hypertension 06/16/2013   Last Assessment & Plan:  Patients blood pressure has seemingly been controlled without significant side effects such as dizziness or slow heart rate.     . Cancer (HCC) skin  . Chronic kidney disease   . Depression   . Diabetes mellitus without complication (HCC)   . GERD (gastroesophageal reflux disease)   . HBV (hepatitis B virus) infection   . High blood pressure   . Hives    food related  . Hypothyroidism   . Mixed incontinence   . Obstructive apnea 06/16/2013  . Sleep apnea   . Thyroid disease     PAST SURGICAL HISTORY :   Past Surgical History:  Procedure Laterality Date  . CATARACT EXTRACTION Bilateral   . ESOPHAGOGASTRODUODENOSCOPY (EGD) WITH PROPOFOL N/A 06/08/2015   Procedure: ESOPHAGOGASTRODUODENOSCOPY (EGD) WITH PROPOFOL;  Surgeon: Paul Y Oh, MD;  Location: [facility] ENDOSCOPY;  Service: Gastroenterology;  Laterality: N/A;  . HEMORRHOID SURGERY     . IMPLANTABLE CONTACT LENS IMPLANTATION    . TONSILLECTOMY    . TUBAL LIGATION      FAMILY HISTORY :   Family History  Problem Relation Age of Onset  . Heart disease Father   . Cancer Mother   . Kidney failure Maternal Aunt     SOCIAL HISTORY:   Social History   Tobacco Use  . Smoking status: Former Smoker    Last attempt to quit: 07/27/1993    Years since quitting: 23.4  . Tobacco comment: quit 20 years  Substance Use Topics  . Alcohol use: No    Alcohol/week: 0.0 oz  . Drug use: No    ALLERGIES:  is allergic to sulfa antibiotics and amoxicillin-pot clavulanate.  MEDICATIONS:  Current Outpatient Medications  Medication Sig Dispense Refill  . acetaminophen (TYLENOL) 500 MG tablet Take 500 mg by mouth every 6 (six) hours as needed for moderate pain.    . azelastine (ASTELIN) 0.1 % nasal spray Place into the nose.    . Biotin w/ Vitamins C & E (HAIR/SKIN/NAILS PO) Take 1 tablet by mouth daily.    . buPROPion (WELLBUTRIN XL) 150 MG 24 hr tablet Take 3 tablet po QD as directed    . Cholecalciferol (VITAMIN D3) 2000 UNITS capsule Take by mouth.    . clotrimazole (MYCELEX) 10 MG troche Take 10 mg by mouth 5 (five) times daily. Reported on 07/08/2015    .   ferrous sulfate 325 (65 FE) MG tablet Take 325 mg by mouth daily with breakfast.    . insulin NPH-regular Human (NOVOLIN 70/30) (70-30) 100 UNIT/ML injection Inject 16 units before breakfast and 26 units before supper as directed    . Insulin Syringe-Needle U-100 (INSULIN SYRINGE 1CC/31GX5/16") 31G X 5/16" 1 ML MISC USE ONE SYRINGE TWICE DAILY AS DIRECTED WITH INSULIN VIALS    . levothyroxine (SYNTHROID, LEVOTHROID) 75 MCG tablet TAKE ONE TABLET BY MOUTH ONCE DAILY    . nystatin cream (MYCOSTATIN) Apply 1 application topically as needed.    . pantoprazole (PROTONIX) 40 MG tablet Take 40 mg by mouth daily.     . PARoxetine (PAXIL) 40 MG tablet Take 40 mg by mouth every morning.     . pravastatin (PRAVACHOL) 80 MG tablet Take 80  mg by mouth daily.     . tacrolimus (PROGRAF) 1 MG capsule Take by mouth.    . fluticasone (FLONASE) 50 MCG/ACT nasal spray Place into the nose.    . furosemide (LASIX) 20 MG tablet Take 1 tablet by mouth daily.    . Iodoquinol-HC-Aloe Polysacch (ALCORTIN A) 1-2-1 % GEL Apply 1 application topically at bedtime.  6  . Multiple Vitamins-Minerals (HAIR SKIN AND NAILS FORMULA PO) Take by mouth.     No current facility-administered medications for this visit.     PHYSICAL EXAMINATION: ECOG PERFORMANCE STATUS: 0 - Asymptomatic  BP (!) 157/83 (BP Location: Right Arm, Patient Position: Sitting)   Temp 98 F (36.7 C) (Tympanic)   Resp 20   Ht 5' 1.5" (1.562 m)   Wt 178 lb (80.7 kg)   BMI 33.09 kg/m   Filed Weights   01/06/17 1449  Weight: 178 lb (80.7 kg)    GENERAL: Well-nourished well-developed; Alert, no distress and comfortable.alone EYES: no pallor or icterus OROPHARYNX: no thrush or ulceration; poor dentition  NECK: supple, no masses felt LYMPH:  no palpable lymphadenopathy in the cervical, axillary or inguinal regions LUNGS: clear to auscultation and  No wheeze or crackles HEART/CVS: regular rate & rhythm and no murmurs; No lower extremity edema ABDOMEN:abdomen soft, non-tender and normal bowel sounds Musculoskeletal:no cyanosis of digits and no clubbing  PSYCH: alert & oriented x 3 with fluent speech NEURO: no focal motor/sensory deficits SKIN:  no rashes or significant lesions  LABORATORY DATA:  I have reviewed the data as listed    Component Value Date/Time   NA 134 (L) 12/29/2016 1410   NA 140 05/19/2011 0409   K 5.4 (H) 12/29/2016 1410   K 4.2 05/19/2011 0409   CL 99 (L) 12/29/2016 1410   CL 105 05/19/2011 0409   CO2 27 12/29/2016 1410   CO2 28 05/19/2011 0409   GLUCOSE 329 (H) 12/29/2016 1410   GLUCOSE 158 (H) 05/19/2011 0409   BUN 22 (H) 12/29/2016 1410   BUN 15 05/19/2011 0409   CREATININE 1.52 (H) 12/29/2016 1410   CREATININE 1.18 05/19/2011 0409    CALCIUM 9.8 12/29/2016 1410   CALCIUM 9.2 05/19/2011 0409   PROT 6.8 12/29/2016 1410   PROT 7.1 05/14/2011 1432   ALBUMIN 3.8 12/29/2016 1410   ALBUMIN 3.2 (L) 05/14/2011 1432   AST 27 12/29/2016 1410   AST 20 05/14/2011 1432   ALT 16 12/29/2016 1410   ALT 20 05/14/2011 1432   ALKPHOS 74 12/29/2016 1410   ALKPHOS 88 05/14/2011 1432   BILITOT 0.5 12/29/2016 1410   BILITOT 0.3 05/14/2011 1432   GFRNONAA 33 (L) 12/29/2016 1410     GFRNONAA 48 (L) 05/19/2011 0409   GFRAA 38 (L) 12/29/2016 1410   GFRAA 56 (L) 05/19/2011 0409    No results found for: SPEP, UPEP  Lab Results  Component Value Date   WBC 2.9 (L) 12/29/2016   NEUTROABS 1.2 (L) 12/29/2016   HGB 13.4 12/29/2016   HCT 40.3 12/29/2016   MCV 90.0 12/29/2016   PLT 177 12/29/2016      Chemistry      Component Value Date/Time   NA 134 (L) 12/29/2016 1410   NA 140 05/19/2011 0409   K 5.4 (H) 12/29/2016 1410   K 4.2 05/19/2011 0409   CL 99 (L) 12/29/2016 1410   CL 105 05/19/2011 0409   CO2 27 12/29/2016 1410   CO2 28 05/19/2011 0409   BUN 22 (H) 12/29/2016 1410   BUN 15 05/19/2011 0409   CREATININE 1.52 (H) 12/29/2016 1410   CREATININE 1.18 05/19/2011 0409      Component Value Date/Time   CALCIUM 9.8 12/29/2016 1410   CALCIUM 9.2 05/19/2011 0409   ALKPHOS 74 12/29/2016 1410   ALKPHOS 88 05/14/2011 1432   AST 27 12/29/2016 1410   AST 20 05/14/2011 1432   ALT 16 12/29/2016 1410   ALT 20 05/14/2011 1432   BILITOT 0.5 12/29/2016 1410   BILITOT 0.3 05/14/2011 1432       RADIOGRAPHIC STUDIES: I have personally reviewed the radiological images as listed and agreed with the findings in the report. No results found.   ASSESSMENT & PLAN:  MGUS (monoclonal gammopathy of unknown significance) Monoclonal gammopathy of unknown significance; IgG Lamda- December 2018 -0.3 g/dL. No clinical signs of progression. Discussed the risk is fairly small.  # Mild leukopenia white count of 2.9 ANC 1.2  Today improved; normal.  Monitor for now.  Asymptomatic.  If worse would recommend bone marrow biopsy.  # CKD- stage III [creat 1.5]; stable- follows up with Dr.Kolluru  # Hyperkalemia- 5.4; ? Dietary. Declines any repeat blood work up.   Recommend follow-up in 12 months with CBC CMP myeloma workup 1 week prior.   Orders Placed This Encounter  Procedures  . CBC with Differential    Standing Status:   Future    Standing Expiration Date:   07/08/2018  . Comprehensive metabolic panel    Standing Status:   Future    Standing Expiration Date:   07/08/2018  . Multiple Myeloma Panel (SPEP&IFE w/QIG)    Standing Status:   Future    Standing Expiration Date:   07/08/2018  . Kappa/lambda light chains    Standing Status:   Future    Standing Expiration Date:   07/08/2018   All questions were answered. The patient knows to call the clinic with any problems, questions or concerns.       R , MD 01/06/2017 3:36 PM  

## 2017-01-16 DIAGNOSIS — Z794 Long term (current) use of insulin: Secondary | ICD-10-CM | POA: Diagnosis not present

## 2017-01-16 DIAGNOSIS — M19042 Primary osteoarthritis, left hand: Secondary | ICD-10-CM | POA: Diagnosis not present

## 2017-01-16 DIAGNOSIS — N183 Chronic kidney disease, stage 3 (moderate): Secondary | ICD-10-CM | POA: Diagnosis not present

## 2017-01-16 DIAGNOSIS — M19041 Primary osteoarthritis, right hand: Secondary | ICD-10-CM | POA: Diagnosis not present

## 2017-01-16 DIAGNOSIS — M79642 Pain in left hand: Secondary | ICD-10-CM | POA: Diagnosis not present

## 2017-01-16 DIAGNOSIS — M50322 Other cervical disc degeneration at C5-C6 level: Secondary | ICD-10-CM | POA: Diagnosis not present

## 2017-01-16 DIAGNOSIS — M542 Cervicalgia: Secondary | ICD-10-CM | POA: Diagnosis not present

## 2017-01-16 DIAGNOSIS — E1122 Type 2 diabetes mellitus with diabetic chronic kidney disease: Secondary | ICD-10-CM | POA: Diagnosis not present

## 2017-01-16 DIAGNOSIS — M79641 Pain in right hand: Secondary | ICD-10-CM | POA: Diagnosis not present

## 2017-01-18 DIAGNOSIS — N2581 Secondary hyperparathyroidism of renal origin: Secondary | ICD-10-CM | POA: Diagnosis not present

## 2017-01-18 DIAGNOSIS — I129 Hypertensive chronic kidney disease with stage 1 through stage 4 chronic kidney disease, or unspecified chronic kidney disease: Secondary | ICD-10-CM | POA: Diagnosis not present

## 2017-01-18 DIAGNOSIS — N183 Chronic kidney disease, stage 3 (moderate): Secondary | ICD-10-CM | POA: Diagnosis not present

## 2017-01-18 DIAGNOSIS — E1122 Type 2 diabetes mellitus with diabetic chronic kidney disease: Secondary | ICD-10-CM | POA: Diagnosis not present

## 2017-01-18 DIAGNOSIS — E875 Hyperkalemia: Secondary | ICD-10-CM | POA: Diagnosis not present

## 2017-01-25 ENCOUNTER — Ambulatory Visit: Payer: Medicare Other | Attending: Internal Medicine | Admitting: Physical Therapy

## 2017-01-25 ENCOUNTER — Encounter: Payer: Self-pay | Admitting: Physical Therapy

## 2017-01-25 ENCOUNTER — Ambulatory Visit: Payer: Medicare Other | Admitting: Physical Therapy

## 2017-01-25 DIAGNOSIS — M25611 Stiffness of right shoulder, not elsewhere classified: Secondary | ICD-10-CM | POA: Diagnosis not present

## 2017-01-25 DIAGNOSIS — M79641 Pain in right hand: Secondary | ICD-10-CM | POA: Diagnosis not present

## 2017-01-25 DIAGNOSIS — M542 Cervicalgia: Secondary | ICD-10-CM | POA: Insufficient documentation

## 2017-01-25 DIAGNOSIS — R293 Abnormal posture: Secondary | ICD-10-CM | POA: Diagnosis not present

## 2017-01-25 DIAGNOSIS — G8929 Other chronic pain: Secondary | ICD-10-CM | POA: Diagnosis not present

## 2017-01-25 DIAGNOSIS — M6281 Muscle weakness (generalized): Secondary | ICD-10-CM | POA: Diagnosis not present

## 2017-01-25 DIAGNOSIS — M25641 Stiffness of right hand, not elsewhere classified: Secondary | ICD-10-CM | POA: Diagnosis not present

## 2017-01-28 NOTE — Therapy (Signed)
[facility] [address] [place], [place], [address] Phone: [phone]   Fax:  [phone]  Physical Therapy Evaluation  Patient Details  Name: Abigail Walker MRN: [id number] Date of Birth: [birth year] Referring Provider: Dr. Annalee Genta   Encounter Date: 01/25/2017  PT End of Session - 01/28/17 1526    Visit Number  1    Number of Visits  8    Date for PT Re-Evaluation  02/22/17    PT Start Time  1115    PT Stop Time  1210    PT Time Calculation (min)  55 min    Activity Tolerance  Patient tolerated treatment well    Behavior During Therapy  [facility] for tasks assessed/performed       Past Medical History:  Diagnosis Date  . Acid reflux 06/16/2013  . Adult hypothyroidism 06/16/2013   Last Assessment & Plan:  Tsh and energy have been stable.    . Benign hypertension 06/16/2013   Last Assessment & Plan:  Patients blood pressure has seemingly been controlled without significant side effects such as dizziness or slow heart rate.     . Cancer ([place]) skin  . Chronic kidney disease   . Depression   . Diabetes mellitus without complication ([place])   . GERD (gastroesophageal reflux disease)   . HBV (hepatitis B virus) infection   . High blood pressure   . Hives    food related  . Hypothyroidism   . Mixed incontinence   . Obstructive apnea 06/16/2013  . Sleep apnea   . Thyroid disease     Past Surgical History:  Procedure Laterality Date  . CATARACT EXTRACTION Bilateral   . ESOPHAGOGASTRODUODENOSCOPY (EGD) WITH PROPOFOL N/A 06/08/2015   Procedure: ESOPHAGOGASTRODUODENOSCOPY (EGD) WITH PROPOFOL;  Surgeon: Hulen Luster, MD;  Location: [facility] ENDOSCOPY;  Service: Gastroenterology;  Laterality: N/A;  . HEMORRHOID SURGERY    . IMPLANTABLE CONTACT LENS IMPLANTATION    . TONSILLECTOMY    . TUBAL LIGATION      There were no vitals filed for this visit.     [facility] PT Assessment - 01/28/17 0001      Assessment   Medical Diagnosis  Chronic  neck pain    Referring Provider  Dr. Annalee Genta    Onset Date/Surgical Date  02/18/16    Prior Therapy  no       Pt. states she is doing okay today. Pt. reports 1/10 R sided neck pain at this time. Pt. states she is inactive and doesn't participate with exercise.       See HEP       Pt. is a pleasant 72 y/o female with chronic h/o neck pain (R>L). Pt. reports 1/10 R sided neck pain at this time and states pain increase with increase activity/ L lateral flexion/ palpation over R UT. Cervical AROM: flexion (45 deg.), extension (50 deg.), R lat. flexion (15 deg.), L lat. flexion (35 deg.)- increase pain in R UT region, R rotn. (46 deg.), L rotn. (58 deg.). Seated shoulder AROM: L sh. flexion (146 deg.) R sh. flexion (108 deg.). L sh. abd. (166 deg.), R sh. abd. (129 deg.). Limited R shoulder IR in seated/ supine position. B sh. flexion 4-/5 MMT, abd. 4/5 MMT, biceps 4+/5 MMT, triceps 4+/5 MMT. Grip strength: R (51#), L (43#). Forward head/ rounded shoulder posture in seated and standing position. NDI: 20%. Pt. will benefit from skilled PT services to increase cervical/ R shoulder ROM and strength to  improve pain-free mobility.    PT Education - 01/28/17 1526    Education provided  Yes    Education Details  See HEP    Person(s) Educated  Patient    Methods  Demonstration;Explanation;Handout    Comprehension  Verbalized understanding;Returned demonstration          PT Long Term Goals - 01/28/17 1543      PT LONG TERM GOAL #1   Title  Pt. independent with HEP to increase cervical lateral flexion/ rotn. to WNL to improve pain-free mobility.      Baseline  Cervical AROM: flexion (45 deg.), extension (50 deg.), R lat. flexion (15 deg.), L lat. flexion (35 deg.)- increase pain in R UT region, R rotn. (46 deg.), L rotn. (58 deg.). Seated shoulder AROM: L sh. flexion (146 deg.) R sh. flexion (108 deg.). L sh. abd. (166 deg.), R sh. abd. (129 deg.).     Time  4    Period  Weeks    Status   New    Target Date  02/22/17      PT LONG TERM GOAL #2   Title  Pt. will increase R shoulder flexion to >130 deg. to improve overhead reaching/ pain-free mobility.      Baseline  Cervical AROM: flexion (45 deg.), extension (50 deg.), R lat. flexion (15 deg.), L lat. flexion (35 deg.)- increase pain in R UT region, R rotn. (46 deg.), L rotn. (58 deg.). Seated shoulder AROM: L sh. flexion (146 deg.) R sh. flexion (108 deg.). L sh. abd. (166 deg.), R sh. abd. (129 deg.).     Time  4    Period  Weeks    Status  New    Target Date  02/22/17      PT LONG TERM GOAL #3   Title  Pt. will decrease NDI to <10% to improve self-perceived disability.      Baseline  NDI on 1/9: 20%    Time  4    Period  Weeks    Status  New    Target Date  02/22/17      PT LONG TERM GOAL #4   Title  Pt. will report no neck pain with everyday functional tasks to improve pain-free mobility.      Baseline  chronic neck pain which increases with everyday tasks/ sleeping posture.     Time  4    Period  Weeks    Status  New    Target Date  02/22/17               Patient will benefit from skilled therapeutic intervention in order to improve the following deficits and impairments:  Pain, Improper body mechanics, Decreased mobility, Postural dysfunction, Hypomobility, Decreased strength, Decreased range of motion, Decreased endurance, Decreased activity tolerance, Impaired flexibility  Visit Diagnosis: Neck pain, chronic  Shoulder joint stiffness, right  Muscle weakness (generalized)  Abnormal posture     Problem List Patient Active Problem List   Diagnosis Date Noted  . MGUS (monoclonal gammopathy of unknown significance) 07/08/2015  . Mixed incontinence 11/09/2014  . Atrophic vaginitis 07/28/2014  . Encounter for general adult medical examination without abnormal findings 05/28/2014  . Major depression in remission ([place]) 11/27/2013  . Morbid obesity ([place]) 11/16/2013  . Microalbuminuria  08/06/2013  . Breath shortness 08/02/2013  . Acid reflux 06/16/2013  . Benign hypertension 06/16/2013  . Type 2 diabetes mellitus ([place]) 06/16/2013  . Adult hypothyroidism 06/16/2013  . Obstructive apnea 06/16/2013  Pura Spice, PT, DPT # [phone] 01/28/2017, 3:56 PM  [facility] [address] [place], [place], [address] Phone: [phone]   Fax:  [phone]  Name: SIBBIE FLAMMIA MRN: [id number] Date of Birth: Nov 08, 1945

## 2017-01-30 ENCOUNTER — Encounter: Payer: Self-pay | Admitting: Physical Therapy

## 2017-01-30 ENCOUNTER — Encounter: Payer: Self-pay | Admitting: Occupational Therapy

## 2017-01-30 ENCOUNTER — Other Ambulatory Visit: Payer: Self-pay

## 2017-01-30 ENCOUNTER — Ambulatory Visit: Payer: Medicare Other | Admitting: Occupational Therapy

## 2017-01-30 ENCOUNTER — Ambulatory Visit: Payer: Medicare Other

## 2017-01-30 DIAGNOSIS — M6281 Muscle weakness (generalized): Secondary | ICD-10-CM | POA: Diagnosis not present

## 2017-01-30 DIAGNOSIS — M25611 Stiffness of right shoulder, not elsewhere classified: Secondary | ICD-10-CM

## 2017-01-30 DIAGNOSIS — M79641 Pain in right hand: Secondary | ICD-10-CM

## 2017-01-30 DIAGNOSIS — M25641 Stiffness of right hand, not elsewhere classified: Secondary | ICD-10-CM

## 2017-01-30 DIAGNOSIS — M542 Cervicalgia: Secondary | ICD-10-CM | POA: Diagnosis not present

## 2017-01-30 DIAGNOSIS — R293 Abnormal posture: Secondary | ICD-10-CM | POA: Diagnosis not present

## 2017-01-30 DIAGNOSIS — G8929 Other chronic pain: Secondary | ICD-10-CM

## 2017-01-30 NOTE — Patient Instructions (Signed)
Moist heat  Tendon glides - pain free range and only ROM - not force or squeeze  Opposition alternate digit- pick up 1 cm objects  Pain free all  Keep pain under 2/10 pain during day   Joint protection - respect pain  Larger joints  builtup handles - avoid tight and sustained grip   Hand out provided   fittted with [facility] neoprene splint for R hand - to use with act  That cause thumb pain

## 2017-01-30 NOTE — Therapy (Signed)
[facility] [address]. [place], [place], [address] Phone: [phone]   Fax:  [phone]  Physical Therapy Treatment  Patient Details  Name: Abigail Walker MRN: [id number] Date of Birth: [birth year] Referring Provider: Dr Meda Coffee   Encounter Date: 01/30/2017  PT End of Session - 01/30/17 1540    Visit Number  2    Number of Visits  8    Date for PT Re-Evaluation  02/22/17    PT Start Time  1435    PT Stop Time  1527    PT Time Calculation (min)  52 min    Activity Tolerance  Patient tolerated treatment well    Behavior During Therapy  [facility] for tasks assessed/performed       Past Medical History:  Diagnosis Date  . Acid reflux 06/16/2013  . Adult hypothyroidism 06/16/2013   Last Assessment & Plan:  Tsh and energy have been stable.    . Benign hypertension 06/16/2013   Last Assessment & Plan:  Patients blood pressure has seemingly been controlled without significant side effects such as dizziness or slow heart rate.     . Cancer ([place]) skin  . Chronic kidney disease   . Depression   . Diabetes mellitus without complication ([place])   . GERD (gastroesophageal reflux disease)   . HBV (hepatitis B virus) infection   . High blood pressure   . Hives    food related  . Hypothyroidism   . Mixed incontinence   . Obstructive apnea 06/16/2013  . Sleep apnea   . Thyroid disease     Past Surgical History:  Procedure Laterality Date  . CATARACT EXTRACTION Bilateral   . ESOPHAGOGASTRODUODENOSCOPY (EGD) WITH PROPOFOL N/A 06/08/2015   Procedure: ESOPHAGOGASTRODUODENOSCOPY (EGD) WITH PROPOFOL;  Surgeon: Hulen Luster, MD;  Location: [facility] ENDOSCOPY;  Service: Gastroenterology;  Laterality: N/A;  . HEMORRHOID SURGERY    . IMPLANTABLE CONTACT LENS IMPLANTATION    . TONSILLECTOMY    . TUBAL LIGATION      There were no vitals filed for this visit.  Subjective Assessment - 01/30/17 1532    Subjective  Pt. states she has been feeling "a little  better" since last appointment which she believes to be secondary to the cervical retratction exercises started at last apointment. Pt. says that she had a good apointment with hand therapist and is now wearing right wrist brase per hand therapist. Patient claims that she perofrmed HEP minus AAROM shoulder flex/abd with pulley secondary to not being able to fix her pulley at home.    Pertinent History  Neck pain started 02/2016 after experiencing a crick in neck.  Pt. tried to prevent friend from falling and experienced an increase in neck pain.  Pt. states she has lost >100# and pt. is waiting on getting dentures (no teeth currently).  Pt. lives alone and likes to read and watch TV.      Currently in Pain?  Yes    Pain Score  -- Pt unable to rate at this time    Pain Location  Neck    Pain Orientation  Right    Pain Descriptors / Indicators  Tightness;Discomfort    Pain Type  Chronic pain            There.ex: Seated cervical retraction x5, then lateral flexion/rotaion with retraction x3 (pt. Very slow with stretch). PT had to give intermitent queuing for propper form. Seated shoulder AAROM with pulley for flex/abd x10. Pt. needed  minimal queuing. Supine isometric cervical rotation and lateral flexion for a 5 second count x5. Pt. required intermittent queuing to perform exercises correctly.  Manual: Supine soft tissue mobilization to right upper trap (5 min) Supine left lateral glide mobilization to cervical spine(c2-c4) with right lateral flexion (5 min) Supine cervical passive stretch of lateral flexion and rotation for 10-15 second hold x3 (7 min) SNAG cervical rotation stretch instructed to patient and performed bilateral x 30s, handout provided. Pt struggles to understand exercise but improves with repetitions.                   PT Education - 01/30/17 1632    Education provided  Yes    Education Details  HEP progressed, exercise form/technique during session.      Person(s) Educated  Patient    Methods  Explanation    Comprehension  Verbalized understanding          PT Long Term Goals - 01/28/17 1543      PT LONG TERM GOAL #1   Title  Pt. independent with HEP to increase cervical lateral flexion/ rotn. to WNL to improve pain-free mobility.      Baseline  Cervical AROM: flexion (45 deg.), extension (50 deg.), R lat. flexion (15 deg.), L lat. flexion (35 deg.)- increase pain in R UT region, R rotn. (46 deg.), L rotn. (58 deg.). Seated shoulder AROM: L sh. flexion (146 deg.) R sh. flexion (108 deg.). L sh. abd. (166 deg.), R sh. abd. (129 deg.).     Time  4    Period  Weeks    Status  New    Target Date  02/22/17      PT LONG TERM GOAL #2   Title  Pt. will increase R shoulder flexion to >130 deg. to improve overhead reaching/ pain-free mobility.      Baseline  Cervical AROM: flexion (45 deg.), extension (50 deg.), R lat. flexion (15 deg.), L lat. flexion (35 deg.)- increase pain in R UT region, R rotn. (46 deg.), L rotn. (58 deg.). Seated shoulder AROM: L sh. flexion (146 deg.) R sh. flexion (108 deg.). L sh. abd. (166 deg.), R sh. abd. (129 deg.).     Time  4    Period  Weeks    Status  New    Target Date  02/22/17      PT LONG TERM GOAL #3   Title  Pt. will decrease NDI to <10% to improve self-perceived disability.      Baseline  NDI on 1/9: 20%    Time  4    Period  Weeks    Status  New    Target Date  02/22/17      PT LONG TERM GOAL #4   Title  Pt. will report no neck pain with everyday functional tasks to improve pain-free mobility.      Baseline  chronic neck pain which increases with everyday tasks/ sleeping posture.     Time  4    Period  Weeks    Status  New    Target Date  02/22/17            Plan - 01/30/17 1544    Clinical Impression Statement  Pt. states she is feeling better since first PT visit, which she believes to be secondary to stretching of cervical lateral flexion/rotation with retraction. Upon palpation  pt. states she has pain in upper trap and cervical areas (R>L). PT noted decreased left lateral  arthrokinematic motion of cervical spine, and with light grade 1-2 mobilzation with movment R lateral flexion improved. Pt. states complince with HEP minus AAROM shoulder flex/abduction secondary to not having a working pulley at home, thus PT gave pt. Pulley and reassesed HEP.    Clinical Presentation  Stable    Clinical Decision Making  Low    PT Next Visit Plan  Isometric cervical lateral flexion and rotation for strength and pain relief.     PT Home Exercise Plan  Cervical retraction and lateral flexion/rotation with retraction in sitting. Shoulder AAROM with pulley in sitting flex/abd. Snag stretches with towel for cervical rotation.    Consulted and Agree with Plan of Care  Patient       Patient will benefit from skilled therapeutic intervention in order to improve the following deficits and impairments:     Visit Diagnosis: Neck pain, chronic  Shoulder joint stiffness, right  Muscle weakness (generalized)  Abnormal posture     Problem List Patient Active Problem List   Diagnosis Date Noted  . MGUS (monoclonal gammopathy of unknown significance) 07/08/2015  . Mixed incontinence 11/09/2014  . Atrophic vaginitis 07/28/2014  . Encounter for general adult medical examination without abnormal findings 05/28/2014  . Major depression in remission ([place]) 11/27/2013  . Morbid obesity ([place]) 11/16/2013  . Microalbuminuria 08/06/2013  . Breath shortness 08/02/2013  . Acid reflux 06/16/2013  . Benign hypertension 06/16/2013  . Type 2 diabetes mellitus ([place]) 06/16/2013  . Adult hypothyroidism 06/16/2013  . Obstructive apnea 06/16/2013   This entire session was performed under direct supervision and direction of a licensed therapist/therapist assistant . I have personally read, edited and approve of the note as written.   Rosario Adie SPT Phillips Grout PT, DPT    Huprich,Jason 01/31/2017, 2:22 PM  [facility] [address] [place], [place], [address] Phone: [phone]   Fax:  [phone]  Name: Abigail Walker MRN: [id number] Date of Birth: 07/20/1945

## 2017-01-30 NOTE — Therapy (Signed)
[facility] [facility] [address] [place], [address] Phone: [phone]   Fax:  [phone]  Occupational Therapy Evaluation  Patient Details  Name: Abigail Walker MRN: [id number] Date of Birth: 1945-09-08 Referring Provider: Dr Meda Coffee   Encounter Date: 01/30/2017  OT End of Session - 01/30/17 [place]    Visit Number  1    Number of Visits  4    Date for OT Re-Evaluation  02/27/17    OT Start Time  7322    OT Stop Time  1401    OT Time Calculation (min)  54 min    Activity Tolerance  Patient tolerated treatment well    Behavior During Therapy  [facility] for tasks assessed/performed       Past Medical History:  Diagnosis Date  . Acid reflux 06/16/2013  . Adult hypothyroidism 06/16/2013   Last Assessment & Plan:  Tsh and energy have been stable.    . Benign hypertension 06/16/2013   Last Assessment & Plan:  Patients blood pressure has seemingly been controlled without significant side effects such as dizziness or slow heart rate.     . Cancer ([place]) skin  . Chronic kidney disease   . Depression   . Diabetes mellitus without complication ([place])   . GERD (gastroesophageal reflux disease)   . HBV (hepatitis B virus) infection   . High blood pressure   . Hives    food related  . Hypothyroidism   . Mixed incontinence   . Obstructive apnea 06/16/2013  . Sleep apnea   . Thyroid disease     Past Surgical History:  Procedure Laterality Date  . CATARACT EXTRACTION Bilateral   . ESOPHAGOGASTRODUODENOSCOPY (EGD) WITH PROPOFOL N/A 06/08/2015   Procedure: ESOPHAGOGASTRODUODENOSCOPY (EGD) WITH PROPOFOL;  Surgeon: Hulen Luster, MD;  Location: [facility] ENDOSCOPY;  Service: Gastroenterology;  Laterality: N/A;  . HEMORRHOID SURGERY    . IMPLANTABLE CONTACT LENS IMPLANTATION    . TONSILLECTOMY    . TUBAL LIGATION      There were no vitals filed for this visit.  Subjective Assessment - 01/30/17 1813    Subjective   My hands had been  bothering me for while but gradually getting worse - until I went to see a Dr - my R thumb and index finger bother me the most - like lifting, carrything , losing strength, open doors, drive     Patient Stated Goals  If the pain can get better in my R index and thumb so I have more strength , and able to do things     Currently in Pain?  Yes    Pain Score  2     Pain Location  Hand    Pain Orientation  Right;Left    Pain Descriptors / Indicators  Aching    Pain Type  Chronic pain    Pain Onset  More than a month ago        [facility] OT Assessment - 01/30/17 0001      Assessment   Medical Diagnosis  OA with R hand >L    Referring Provider  Dr Meda Coffee    Onset Date/Surgical Date  01/16/17      Prior Function   Vocation  Retired    Leisure  R hand dominant, watch tv, do paper cross worrds, read, has cats       Strength   Right Hand Grip (lbs)  25 CMC splint on 35 lbs and less pain  Right Hand Lateral Pinch  6 lbs with CMC splint less pain and 8 lbs    Right Hand 3 Point Pinch  7.5 lbs    Left Hand Grip (lbs)  35    Left Hand Lateral Pinch  9 lbs    Left Hand 3 Point Pinch  10 lbs      Right Hand AROM   R Thumb Radial ABduction/ADduction 0-55  42    R Thumb Palmar ABduction/ADduction 0-45  42    R Thumb Opposition to Index  -- Pain at thumb with opposition to tip of 5th     R Index  MCP 0-90  80 Degrees      Left Hand AROM   L Thumb Radial ADduction/ABduction 0-55  48    L Thumb Palmar ADduction/ABduction 0-45  50    L Thumb Opposition to Index  -- Oposition to base of 5th     L Index  MCP 0-90  85 Degrees      Paraffin done to R hand to decrease pain and increase ROM - decrease pain in thumb   prior to review of HEP    HEP review and hand out provided   Moist heat  Tendon glides - pain free range and only ROM - not force or squeeze  Opposition alternate digit- pick up 1 cm objects  Pain free all  Keep pain under 2/10 pain during day   Joint protection - respect pain   Larger joints  builtup handles - avoid tight and sustained grip    fittted with CMC neoprene splint for R hand - to use with act  That cause thumb pain               OT Education - 01/30/17 [place]    Education provided  Yes    Education Details  findings of eval and HEP - joint protection principles     Person(s) Educated  Patient    Methods  Explanation;Demonstration;Tactile cues;Verbal cues;Handout    Comprehension  Verbal cues required;Returned demonstration;Verbalized understanding       OT Short Term Goals - 01/30/17 1840      OT SHORT TERM GOAL #1   Title  Pain on PRWHE improve with 10 points     Baseline  Pain on PRWHE at eval 26/50     Time  3    Period  Weeks    Status  New    Target Date  02/20/17      OT SHORT TERM GOAL #2   Title  Pt to be independent in HEP  and use CMC neoprene splint  to decrease pain , maintain and increase ROM in R hand     Baseline  no knowledge of HEP     Time  3    Period  Weeks    Status  New    Target Date  02/20/17        OT Long Term Goals - 01/30/17 1842      OT LONG TERM GOAL #1   Title  Pt demo and verbalize 3 joint protection and AE to increase ease and decrease pain in hands with use     Baseline  no knowledge     Time  4    Period  Weeks    Status  New    Target Date  02/27/17            Plan - 01/30/17 1830    Clinical Impression  Statement  Pt present at OT evaluation  with bilateral hand pain with R worse than L - had for years - but went to MD end of last year - has OA - pt report increase pain at R thumb>L and R  2nd MC -  decrease ROM at 2nd digit MC , and bilateral thumb CMC , decrease grip and prehension but increase with use of CMC neoprene splint - pt ed on use of splint , HEP , and joint protetion to decrease pain  and preventing  ROM and pain to worsen in bilateral hands  - pt can benefit from OT services     Occupational performance deficits (Please refer to evaluation for details):   ADL's;IADL's;Play;Leisure    Rehab Potential  Fair    Current Impairments/barriers affecting progress:  chronic condition     OT Frequency  1x / week    OT Duration  4 weeks    OT Treatment/Interventions  Self-care/ADL training;Splinting;Therapeutic exercise;Paraffin;Patient/family education;Manual Therapy    Plan   assess progress with HEP to decrease pain and increase use - splint use     Clinical Decision Making  Several treatment options, min-mod task modification necessary    OT Home Exercise Plan  see pt instruction     Consulted and Agree with Plan of Care  Patient       Patient will benefit from skilled therapeutic intervention in order to improve the following deficits and impairments:  Impaired flexibility, Pain, Decreased coordination, Decreased strength, Impaired UE functional use  Visit Diagnosis: Pain in right hand - Plan: Ot plan of care cert/re-cert  Stiffness of right hand, not elsewhere classified - Plan: Ot plan of care cert/re-cert  Muscle weakness (generalized) - Plan: Ot plan of care cert/re-cert    Problem List Patient Active Problem List   Diagnosis Date Noted  . MGUS (monoclonal gammopathy of unknown significance) 07/08/2015  . Mixed incontinence 11/09/2014  . Atrophic vaginitis 07/28/2014  . Encounter for general adult medical examination without abnormal findings 05/28/2014  . Major depression in remission ([place]) 11/27/2013  . Morbid obesity ([place]) 11/16/2013  . Microalbuminuria 08/06/2013  . Breath shortness 08/02/2013  . Acid reflux 06/16/2013  . Benign hypertension 06/16/2013  . Type 2 diabetes mellitus ([place]) 06/16/2013  . Adult hypothyroidism 06/16/2013  . Obstructive apnea 06/16/2013    Rosalyn Gess OTR/L,CLT 01/30/2017, 6:45 PM  [place] [facility] [address] [place], [address] Phone: [phone]   Fax:  [phone]  Name: Abigail Walker MRN: [id number] Date of  Birth: Oct 12, 1945

## 2017-02-01 ENCOUNTER — Ambulatory Visit: Payer: Medicare Other | Admitting: Physical Therapy

## 2017-02-06 ENCOUNTER — Encounter: Payer: Medicare Other | Admitting: Physical Therapy

## 2017-02-06 ENCOUNTER — Ambulatory Visit: Payer: Medicare Other | Admitting: Physical Therapy

## 2017-02-07 ENCOUNTER — Ambulatory Visit: Payer: Medicare Other | Admitting: Occupational Therapy

## 2017-02-08 ENCOUNTER — Encounter: Payer: Medicare Other | Admitting: Physical Therapy

## 2017-02-13 ENCOUNTER — Encounter: Payer: Self-pay | Admitting: Physical Therapy

## 2017-02-13 ENCOUNTER — Ambulatory Visit: Payer: Medicare Other

## 2017-02-13 DIAGNOSIS — M6281 Muscle weakness (generalized): Secondary | ICD-10-CM | POA: Diagnosis not present

## 2017-02-13 DIAGNOSIS — M25611 Stiffness of right shoulder, not elsewhere classified: Secondary | ICD-10-CM

## 2017-02-13 DIAGNOSIS — R293 Abnormal posture: Secondary | ICD-10-CM | POA: Diagnosis not present

## 2017-02-13 DIAGNOSIS — M542 Cervicalgia: Secondary | ICD-10-CM | POA: Diagnosis not present

## 2017-02-13 DIAGNOSIS — G8929 Other chronic pain: Secondary | ICD-10-CM

## 2017-02-13 DIAGNOSIS — M79641 Pain in right hand: Secondary | ICD-10-CM | POA: Diagnosis not present
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
## 2017-02-15 ENCOUNTER — Ambulatory Visit: Payer: Medicare Other | Admitting: Occupational Therapy

## 2017-02-15 ENCOUNTER — Encounter: Payer: Medicare Other | Admitting: Physical Therapy

## 2017-02-15 DIAGNOSIS — M25641 Stiffness of right hand, not elsewhere classified: Secondary | ICD-10-CM

## 2017-02-15 DIAGNOSIS — M79641 Pain in right hand: Secondary | ICD-10-CM | POA: Diagnosis not present

## 2017-02-15 DIAGNOSIS — M25611 Stiffness of right shoulder, not elsewhere classified: Secondary | ICD-10-CM | POA: Diagnosis not present

## 2017-02-15 DIAGNOSIS — G8929 Other chronic pain: Secondary | ICD-10-CM | POA: Diagnosis not present

## 2017-02-15 DIAGNOSIS — R293 Abnormal posture: Secondary | ICD-10-CM | POA: Diagnosis not present

## 2017-02-15 DIAGNOSIS — M542 Cervicalgia: Secondary | ICD-10-CM | POA: Diagnosis not present

## 2017-02-15 DIAGNOSIS — M6281 Muscle weakness (generalized): Secondary | ICD-10-CM | POA: Diagnosis not present

## 2017-02-15 NOTE — Therapy (Signed)
[place] [facility] [address], [place], [address] Phone: [phone]   Fax:  [phone]  Occupational Therapy Treatment/dicharge   Patient Details  Name: Abigail Walker MRN: [id number] Date of Birth: [birth year] Referring Provider: Dr Meda Coffee   Encounter Date: 02/15/2017  OT End of Session - 02/15/17 1852    Visit Number  2    Number of Visits  2    Date for OT Re-Evaluation  02/27/17    OT Start Time  1245    OT Stop Time  1314    OT Time Calculation (min)  29 min    Activity Tolerance  Patient tolerated treatment well    Behavior During Therapy  [facility] for tasks assessed/performed       Past Medical History:  Diagnosis Date  . Acid reflux 06/16/2013  . Adult hypothyroidism 06/16/2013   Last Assessment & Plan:  Tsh and energy have been stable.    . Benign hypertension 06/16/2013   Last Assessment & Plan:  Patients blood pressure has seemingly been controlled without significant side effects such as dizziness or slow heart rate.     . Cancer ([place]) skin  . Chronic kidney disease   . Depression   . Diabetes mellitus without complication ([place])   . GERD (gastroesophageal reflux disease)   . HBV (hepatitis B virus) infection   . High blood pressure   . Hives    food related  . Hypothyroidism   . Mixed incontinence   . Obstructive apnea 06/16/2013  . Sleep apnea   . Thyroid disease     Past Surgical History:  Procedure Laterality Date  . CATARACT EXTRACTION Bilateral   . ESOPHAGOGASTRODUODENOSCOPY (EGD) WITH PROPOFOL N/A 06/08/2015   Procedure: ESOPHAGOGASTRODUODENOSCOPY (EGD) WITH PROPOFOL;  Surgeon: Hulen Luster, MD;  Location: [facility] ENDOSCOPY;  Service: Gastroenterology;  Laterality: N/A;  . HEMORRHOID SURGERY    . IMPLANTABLE CONTACT LENS IMPLANTATION    . TONSILLECTOMY    . TUBAL LIGATION      There were no vitals filed for this visit.  Subjective Assessment - 02/15/17 1849    Subjective   I am doing  much better - pain at the worse around 1-3/10- but most of time 0/10 like now - I am using the soft splint - helps a lot with driving - thumb pain much better and then I try and use my palms to pick up things , and not hold with my thumbs - the index finger knuckle still bothers me some     Patient Stated Goals  If the pain can get better in my R index and thumb so I have more strength , and able to do things     Currently in Pain?  No/denies         [facility] OT Assessment - 02/15/17 0001      Strength   Right Hand Grip (lbs)  50    Right Hand Lateral Pinch  11 lbs    Right Hand 3 Point Pinch  10 lbs    Left Hand Grip (lbs)  40    Left Hand Lateral Pinch  15 lbs    Left Hand 3 Point Pinch  14 lbs      Right Hand AROM   R Thumb Radial ABduction/ADduction 0-55  45    R Thumb Palmar ABduction/ADduction 0-45  50    R Thumb Opposition to Index  -- Opposition to base of 5th -  slight pull     R Index  MCP 0-90  85 Degrees        Pt arrive with  reports that her pain is better- and using CMC neoprene splint - helps a lot for driving  Measurements taken - see flowsheet For ROM and grip and prehension   great progress  Review with pt HEP for ROM   did not do intrinsic fist AROM  And add this date thumb PA and RA   pt to not force but only do AROM   cont with opposition - this date to base of 5th - last at eval was to tip of 5th   Discuss again joint protection principles  And CMC neoprene splint use                  OT Education - 02/15/17 1852    Education provided  Yes    Education Details  Review HEP for dischare and Joint protection - discuss progress and use of splint     Person(s) Educated  Patient    Methods  Explanation;Demonstration;Tactile cues    Comprehension  Verbalized understanding;Returned demonstration       OT Short Term Goals - 02/15/17 1858      OT SHORT TERM GOAL #1   Title  Pain on PRWHE improve with 10 points     Baseline  Pain on PRWHE at  eval 26/50  and now 7/50     Status  Achieved      OT SHORT TERM GOAL #2   Title  Pt to be independent in HEP  and use CMC neoprene splint  to decrease pain , maintain and increase ROM in R hand     Status  Achieved        OT Long Term Goals - 02/15/17 1858      OT LONG TERM GOAL #1   Title  Pt demo and verbalize 3 joint protection and AE to increase ease and decrease pain in hands with use     Status  Achieved            Plan - 02/15/17 1853    Clinical Impression Statement   Pt was seen for evaluation 2 wks ago - provided HEP and CMC neoprene splint to use with painfull act - pt show this date  great progress in  pain , ROM , grip and prehension strength in R hand - pt met all goals and independent in HEP to cont with  at this time     Current Impairments/barriers affecting progress:  chronic condition     Plan  discharge with homeprogram     OT Home Exercise Plan  see pt instruction     Consulted and Agree with Plan of Care  Patient       Patient will benefit from skilled therapeutic intervention in order to improve the following deficits and impairments:     Visit Diagnosis: Pain in right hand  Stiffness of right hand, not elsewhere classified  Muscle weakness (generalized)    Problem List Patient Active Problem List   Diagnosis Date Noted  . MGUS (monoclonal gammopathy of unknown significance) 07/08/2015  . Mixed incontinence 11/09/2014  . Atrophic vaginitis 07/28/2014  . Encounter for general adult medical examination without abnormal findings 05/28/2014  . Major depression in remission ([place]) 11/27/2013  . Morbid obesity ([place]) 11/16/2013  . Microalbuminuria 08/06/2013  . Breath shortness 08/02/2013  . Acid reflux 06/16/2013  . Benign  hypertension 06/16/2013  . Type 2 diabetes mellitus ([place]) 06/16/2013  . Adult hypothyroidism 06/16/2013  . Obstructive apnea 06/16/2013    Rosalyn Gess OTR/L,CLT 02/15/2017, 7:00 PM  [place] [facility] [address] [place], [address] Phone: [phone]   Fax:  [phone]  Name: KRYSTIANNA SOTH MRN: [id number] Date of Birth: 21-Nov-1945

## 2017-02-15 NOTE — Patient Instructions (Signed)
Cont with heat  Tendon glides AROM  Thumb PA and RA add  And cont  With Opposition to all digits  Wear CMC neoprene splint as needed  And cont with joint protection principles  Keep pain under 1-2/10 at the worse

## 2017-03-16 DIAGNOSIS — G8929 Other chronic pain: Secondary | ICD-10-CM | POA: Diagnosis not present

## 2017-03-16 DIAGNOSIS — M19042 Primary osteoarthritis, left hand: Secondary | ICD-10-CM | POA: Diagnosis not present

## 2017-03-16 DIAGNOSIS — M19041 Primary osteoarthritis, right hand: Secondary | ICD-10-CM | POA: Diagnosis not present

## 2017-03-16 DIAGNOSIS — M47816 Spondylosis without myelopathy or radiculopathy, lumbar region: Secondary | ICD-10-CM | POA: Diagnosis not present

## 2017-03-16 DIAGNOSIS — M545 Low back pain, unspecified: Secondary | ICD-10-CM | POA: Insufficient documentation

## 2017-03-16 DIAGNOSIS — M542 Cervicalgia: Secondary | ICD-10-CM | POA: Diagnosis not present

## 2017-03-20 DIAGNOSIS — F331 Major depressive disorder, recurrent, moderate: Secondary | ICD-10-CM | POA: Diagnosis not present

## 2017-03-28 DIAGNOSIS — E785 Hyperlipidemia, unspecified: Secondary | ICD-10-CM | POA: Diagnosis not present

## 2017-03-28 DIAGNOSIS — I10 Essential (primary) hypertension: Secondary | ICD-10-CM | POA: Diagnosis not present

## 2017-03-28 DIAGNOSIS — E1122 Type 2 diabetes mellitus with diabetic chronic kidney disease: Secondary | ICD-10-CM | POA: Diagnosis not present

## 2017-03-28 DIAGNOSIS — Z794 Long term (current) use of insulin: Secondary | ICD-10-CM | POA: Diagnosis not present

## 2017-03-28 DIAGNOSIS — E039 Hypothyroidism, unspecified: Secondary | ICD-10-CM | POA: Diagnosis not present

## 2017-03-28 DIAGNOSIS — E1169 Type 2 diabetes mellitus with other specified complication: Secondary | ICD-10-CM | POA: Diagnosis not present

## 2017-03-28 DIAGNOSIS — N183 Chronic kidney disease, stage 3 (moderate): Secondary | ICD-10-CM | POA: Diagnosis not present

## 2017-04-10 DIAGNOSIS — G4733 Obstructive sleep apnea (adult) (pediatric): Secondary | ICD-10-CM | POA: Diagnosis not present

## 2017-04-10 DIAGNOSIS — Z794 Long term (current) use of insulin: Secondary | ICD-10-CM | POA: Diagnosis not present

## 2017-04-10 DIAGNOSIS — F325 Major depressive disorder, single episode, in full remission: Secondary | ICD-10-CM | POA: Diagnosis not present

## 2017-04-10 DIAGNOSIS — Z9989 Dependence on other enabling machines and devices: Secondary | ICD-10-CM | POA: Diagnosis not present

## 2017-04-10 DIAGNOSIS — N183 Chronic kidney disease, stage 3 (moderate): Secondary | ICD-10-CM | POA: Diagnosis not present

## 2017-04-10 DIAGNOSIS — I10 Essential (primary) hypertension: Secondary | ICD-10-CM | POA: Diagnosis not present

## 2017-04-10 DIAGNOSIS — E1169 Type 2 diabetes mellitus with other specified complication: Secondary | ICD-10-CM | POA: Diagnosis not present

## 2017-04-10 DIAGNOSIS — E1122 Type 2 diabetes mellitus with diabetic chronic kidney disease: Secondary | ICD-10-CM | POA: Diagnosis not present

## 2017-04-10 DIAGNOSIS — E785 Hyperlipidemia, unspecified: Secondary | ICD-10-CM | POA: Diagnosis not present

## 2017-04-10 DIAGNOSIS — E039 Hypothyroidism, unspecified: Secondary | ICD-10-CM | POA: Diagnosis not present

## 2017-05-12 DIAGNOSIS — E1165 Type 2 diabetes mellitus with hyperglycemia: Secondary | ICD-10-CM | POA: Diagnosis not present

## 2017-05-12 DIAGNOSIS — Z794 Long term (current) use of insulin: Secondary | ICD-10-CM | POA: Diagnosis not present

## 2017-05-12 DIAGNOSIS — E1122 Type 2 diabetes mellitus with diabetic chronic kidney disease: Secondary | ICD-10-CM | POA: Diagnosis not present

## 2017-05-12 DIAGNOSIS — N183 Chronic kidney disease, stage 3 (moderate): Secondary | ICD-10-CM | POA: Diagnosis not present

## 2017-05-12 DIAGNOSIS — E039 Hypothyroidism, unspecified: Secondary | ICD-10-CM | POA: Diagnosis not present

## 2017-05-15 DIAGNOSIS — R32 Unspecified urinary incontinence: Secondary | ICD-10-CM | POA: Diagnosis not present

## 2017-05-15 DIAGNOSIS — L900 Lichen sclerosus et atrophicus: Secondary | ICD-10-CM | POA: Diagnosis not present

## 2017-05-15 DIAGNOSIS — B372 Candidiasis of skin and nail: Secondary | ICD-10-CM | POA: Diagnosis not present

## 2017-05-15 DIAGNOSIS — L304 Erythema intertrigo: Secondary | ICD-10-CM | POA: Diagnosis not present

## 2017-05-15 DIAGNOSIS — L439 Lichen planus, unspecified: Secondary | ICD-10-CM | POA: Diagnosis not present

## 2017-07-19 DIAGNOSIS — N2581 Secondary hyperparathyroidism of renal origin: Secondary | ICD-10-CM | POA: Diagnosis not present

## 2017-07-19 DIAGNOSIS — E1122 Type 2 diabetes mellitus with diabetic chronic kidney disease: Secondary | ICD-10-CM | POA: Diagnosis not present

## 2017-07-19 DIAGNOSIS — N183 Chronic kidney disease, stage 3 (moderate): Secondary | ICD-10-CM | POA: Diagnosis not present

## 2017-07-19 DIAGNOSIS — R809 Proteinuria, unspecified: Secondary | ICD-10-CM | POA: Diagnosis not present

## 2017-07-19 DIAGNOSIS — I129 Hypertensive chronic kidney disease with stage 1 through stage 4 chronic kidney disease, or unspecified chronic kidney disease: Secondary | ICD-10-CM | POA: Diagnosis not present

## 2017-08-03 DIAGNOSIS — E1122 Type 2 diabetes mellitus with diabetic chronic kidney disease: Secondary | ICD-10-CM | POA: Diagnosis not present

## 2017-08-03 DIAGNOSIS — E785 Hyperlipidemia, unspecified: Secondary | ICD-10-CM | POA: Diagnosis not present

## 2017-08-03 DIAGNOSIS — N183 Chronic kidney disease, stage 3 (moderate): Secondary | ICD-10-CM | POA: Diagnosis not present

## 2017-08-03 DIAGNOSIS — Z794 Long term (current) use of insulin: Secondary | ICD-10-CM | POA: Diagnosis not present

## 2017-08-03 DIAGNOSIS — E039 Hypothyroidism, unspecified: Secondary | ICD-10-CM | POA: Diagnosis not present

## 2017-08-03 DIAGNOSIS — E1169 Type 2 diabetes mellitus with other specified complication: Secondary | ICD-10-CM | POA: Diagnosis not present

## 2017-08-03 DIAGNOSIS — I10 Essential (primary) hypertension: Secondary | ICD-10-CM | POA: Diagnosis not present

## 2017-08-08 ENCOUNTER — Ambulatory Visit
Admission: EM | Admit: 2017-08-08 | Discharge: 2017-08-08 | Disposition: A | Discharge status: Home / Self Care | Payer: Medicare Other | Attending: Family Medicine | Admitting: Family Medicine

## 2017-08-08 ENCOUNTER — Encounter: Payer: Self-pay | Admitting: Emergency Medicine

## 2017-08-08 ENCOUNTER — Other Ambulatory Visit: Payer: Self-pay

## 2017-08-08 DIAGNOSIS — H60391 Other infective otitis externa, right ear: Secondary | ICD-10-CM

## 2017-08-08 MED ORDER — NEOMYCIN-POLYMYXIN-HC 3.5-10000-1 OT SUSP: 4.0000 [drp] | Freq: Three times a day (TID) | OTIC | 0 refills | Status: DC

## 2017-08-08 MED ORDER — CEFUROXIME AXETIL 250 MG PO TABS: ORAL_TABLET | ORAL | 0 refills | Status: DC

## 2017-08-08 MED ORDER — HYDROCODONE-ACETAMINOPHEN 5-325 MG PO TABS: 2.0000 | ORAL_TABLET | ORAL | 0 refills | Status: DC | PRN

## 2017-08-08 NOTE — ED Triage Notes (Signed)
Pt here today c/o right ear pain. The pain radiates up into her head and into her jaw. She had a hearing aid that was too big and irritating her ear. Pain started 5 days ago. No discharge from ear. Ear feels warm to the touch.

## 2017-08-08 NOTE — ED Provider Notes (Signed)
[facility]    CSN: [id number] Arrival date & time: 08/08/17  1610     History   Chief Complaint Chief Complaint  Patient presents with  . Otalgia    right    HPI Abigail Walker is a 72 y.o. female.   HPI   -year-old female presents today with right ear pain.  She had a hearing aid that was too small for her and was Falling out of her ear.  She had this changed out about 3 weeks ago but the hearing aid was bigger and seem to cause her more pain in her ear.  About 5 days ago she  Noticed itching in her ear and  perhaps scratched the inside of her ear.  She has no discharge.  She states that the ear feels warm to the touch.  The pain will radiate from her ear up into her temple area and into her jaw.  She is had no fever or chills.  Is very painful to touch the outer portion of her ear indicating the tragus.  No mastoid pain.         Past Medical History:  Diagnosis Date  . Acid reflux 06/16/2013  . Adult hypothyroidism 06/16/2013   Last Assessment & Plan:  Tsh and energy have been stable.    . Benign hypertension 06/16/2013   Last Assessment & Plan:  Patients blood pressure has seemingly been controlled without significant side effects such as dizziness or slow heart rate.     . Cancer ([place]) skin  . Chronic kidney disease   . Depression   . Diabetes mellitus without complication ([place])   . GERD (gastroesophageal reflux disease)   . HBV (hepatitis B virus) infection   . High blood pressure   . Hives    food related  . Hypothyroidism   . Mixed incontinence   . Obstructive apnea 06/16/2013  . Sleep apnea   . Thyroid disease     Patient Active Problem List   Diagnosis Date Noted  . MGUS (monoclonal gammopathy of unknown significance) 07/08/2015  . Mixed incontinence 11/09/2014  . Atrophic vaginitis 07/28/2014  . Encounter for general adult medical examination without abnormal findings 05/28/2014  . Major depression in remission ([place]) 11/27/2013  . Morbid  obesity ([place]) 11/16/2013  . Microalbuminuria 08/06/2013  . Breath shortness 08/02/2013  . Acid reflux 06/16/2013  . Benign hypertension 06/16/2013  . Type 2 diabetes mellitus ([place]) 06/16/2013  . Adult hypothyroidism 06/16/2013  . Obstructive apnea 06/16/2013    Past Surgical History:  Procedure Laterality Date  . CATARACT EXTRACTION Bilateral   . ESOPHAGOGASTRODUODENOSCOPY (EGD) WITH PROPOFOL N/A 06/08/2015   Procedure: ESOPHAGOGASTRODUODENOSCOPY (EGD) WITH PROPOFOL;  Surgeon: Hulen Luster, MD;  Location: [facility] ENDOSCOPY;  Service: Gastroenterology;  Laterality: N/A;  . HEMORRHOID SURGERY    . IMPLANTABLE CONTACT LENS IMPLANTATION    . TONSILLECTOMY    . TUBAL LIGATION      OB History   None      Home Medications    Prior to Admission medications   Medication Sig Start Date End Date Taking? Authorizing Provider  acetaminophen (TYLENOL) 500 MG tablet Take 500 mg by mouth every 6 (six) hours as needed for moderate pain.   Yes [provider]  azelastine (ASTELIN) 0.1 % nasal spray Place into the nose.   Yes [provider]  Biotin w/ Vitamins C & E (HAIR/SKIN/NAILS PO) Take 1 tablet by mouth daily.   Yes [provider]  buPROPion (WELLBUTRIN XL) 150 MG 24 hr tablet Take 3 tablet po QD as directed   Yes [provider]  Cholecalciferol (VITAMIN D3) 2000 UNITS capsule Take by mouth.   Yes [provider]  ferrous sulfate 325 (65 FE) MG tablet Take 325 mg by mouth daily with breakfast.   Yes [provider]  fluticasone (FLONASE) 50 MCG/ACT nasal spray Place into the nose. 11/27/13 08/08/17 Yes [provider]  furosemide (LASIX) 20 MG tablet Take 1 tablet by mouth daily. 10/29/16  Yes [provider]  insulin NPH-regular Human (NOVOLIN 70/30) (70-30) 100 UNIT/ML injection Inject 16 units before breakfast and 26 units before supper as directed 11/13/13  Yes [provider]  Insulin Syringe-Needle U-100  (INSULIN SYRINGE 1CC/31GX5/16") 31G X 5/16" 1 ML MISC USE ONE SYRINGE TWICE DAILY AS DIRECTED WITH INSULIN VIALS 05/26/14  Yes [provider]  levothyroxine (SYNTHROID, LEVOTHROID) 75 MCG tablet TAKE ONE TABLET BY MOUTH ONCE DAILY 05/21/14  Yes [provider]  Multiple Vitamins-Minerals (HAIR SKIN AND NAILS FORMULA PO) Take by mouth.   Yes [provider]  nystatin cream (MYCOSTATIN) Apply 1 application topically as needed. 01/08/14  Yes [provider]  pantoprazole (PROTONIX) 40 MG tablet Take 40 mg by mouth daily.  07/07/14  Yes [provider]  PARoxetine (PAXIL) 40 MG tablet Take 40 mg by mouth every morning.    Yes [provider]  pravastatin (PRAVACHOL) 80 MG tablet Take 80 mg by mouth daily.  04/20/15  Yes [provider]  tacrolimus (PROGRAF) 1 MG capsule Take by mouth.   Yes [provider]  cefUROXime (CEFTIN) 250 MG tablet Take one tablet BID with food 08/08/17   Lorin Picket, PA-C  clotrimazole (MYCELEX) 10 MG troche Take 10 mg by mouth 5 (five) times daily. Reported on 07/08/2015    [provider]  HYDROcodone-acetaminophen (NORCO/VICODIN) 5-325 MG tablet Take 2 tablets by mouth every 4 (four) hours as needed. 08/08/17   Lorin Picket, PA-C  Iodoquinol-HC-Aloe Polysacch (ALCORTIN A) 1-2-1 % GEL Apply 1 application topically at bedtime. 11/11/16   [provider]  neomycin-polymyxin-hydrocortisone (CORTISPORIN) 3.5-10000-1 OTIC suspension Place 4 drops into the right ear 3 (three) times daily. 08/08/17   Lorin Picket, PA-C    Family History Family History  Problem Relation Age of Onset  . Heart disease Father   . Cancer Mother   . Kidney failure Maternal Aunt     Social History Social History   Tobacco Use  . Smoking status: Former Smoker    Last attempt to quit: 07/27/1993    Years since quitting: 24.0  . Smokeless tobacco: Never Used  . Tobacco comment: quit 20 years    Substance Use Topics  . Alcohol use: No    Alcohol/week: 0.0 oz  . Drug use: No     Allergies   Sulfa antibiotics and Amoxicillin-pot clavulanate   Review of Systems Review of Systems  Constitutional: Positive for activity change. Negative for appetite change, chills, fatigue and fever.  HENT: Positive for ear pain. Negative for ear discharge.   All other systems reviewed and are negative.    Physical Exam Triage Vital Signs ED Triage Vitals  Enc Vitals Group     BP 08/08/17 1635 138/85     Pulse --      Resp 08/08/17 1635 17     Temp 08/08/17 1635 98.9 F (37.2 C)     Temp Source 08/08/17 1635 Oral  SpO2 08/08/17 1635 95 %     Weight 08/08/17 1631 196 lb (88.9 kg)     Height 08/08/17 1631 5\' 1"  (1.549 m)     Head Circumference --      Peak Flow --      Pain Score 08/08/17 1630 5     Pain Loc --      Pain Edu? --      Excl. in [place]? --    No data found.  Updated Vital Signs BP 138/85 (BP Location: Left Arm)   Temp 98.9 F (37.2 C) (Oral)   Resp 17   Ht 5\' 1"  (1.549 m)   Wt 196 lb (88.9 kg)   SpO2 95%   BMI 37.03 kg/m   Visual Acuity Right Eye Distance:   Left Eye Distance:   Bilateral Distance:    Right Eye Near:   Left Eye Near:    Bilateral Near:     Physical Exam  Constitutional: She is oriented to person, place, and time. She appears well-developed and well-nourished. No distress.  HENT:  Head: Normocephalic.  Left Ear: External ear normal.  Nose: Nose normal.  Mouth/Throat: Oropharynx is clear and moist. No oropharyngeal exudate.  Examination of the left ear shows a normal canal and TM.  Examination of the right ear shows significant pain with any movement of the tragus or auricle.  Canal is very swollen; no discharge is seen.  Very tender to examination from the speculum.  She has a mild tenderness over the superior portion of the outer ear but no mastoid tenderness or fluctuance or induration.  There is no significant cervical adenopathy  present.  There is tenderness also extends along the jawline for short distance.  Eyes: Pupils are equal, round, and reactive to light. Right eye exhibits no discharge. Left eye exhibits no discharge.  Neck: Normal range of motion.  Musculoskeletal: Normal range of motion.  Lymphadenopathy:    She has no cervical adenopathy.  Neurological: She is alert and oriented to person, place, and time.  Skin: Skin is warm and dry. She is not diaphoretic.  Psychiatric: She has a normal mood and affect. Her behavior is normal. Judgment and thought content normal.  Nursing note and vitals reviewed.    UC Treatments / Results  Labs (all labs ordered are listed, but only abnormal results are displayed) Labs Reviewed - No data to display  EKG None  Radiology No results found.  Procedures Procedures (including critical care time)  Medications Ordered in UC Medications - No data to display  Initial Impression / Assessment and Plan / UC Course  I have reviewed the triage vital signs and the nursing notes.  Pertinent labs & imaging results that were available during my care of the patient were reviewed by me and considered in my medical decision making (see chart for details).     .Plan: 1. Test/x-ray results and diagnosis reviewed with patient 2. rx as per orders; risks, benefits, potential side effects reviewed with patient 3. Recommend supportive treatment with use of Cortisporin drops and Ceftin p.o. antibiotic.  She states that her only allergy to antibiotics are  sulfa drugs although there is a notation of a rash with Augmentin.  Because this I will prescribed a cephalosporin, Ceftin.  Although she does not want narcotics, I feel that a small prescription may benefit her if she has more pain in the evening.  This was a written prescription and she will fill it only if she feels  the need.  Otherwise she will just take Tylenol.  If she is not improving I have given her the name of an ENT  specialist that she may follow-up with.. 4. F/u prn if symptoms worsen or don't improve  Final Clinical Impressions(s) / UC Diagnoses   Final diagnoses:  Other infective acute otitis externa of right ear   Discharge Instructions   None    ED Prescriptions    Medication Sig Dispense Auth. Provider   neomycin-polymyxin-hydrocortisone (CORTISPORIN) 3.5-10000-1 OTIC suspension Place 4 drops into the right ear 3 (three) times daily. 10 mL Crecencio Mc P, PA-C   cefUROXime (CEFTIN) 250 MG tablet Take one tablet BID with food 14 tablet Lorin Picket, PA-C   HYDROcodone-acetaminophen (NORCO/VICODIN) 5-325 MG tablet Take 2 tablets by mouth every 4 (four) hours as needed. 6 tablet Lorin Picket, PA-C     Controlled Substance Prescriptions [place] Controlled Substance Registry consulted?    She does not have any records on the [place] controlled substance abuse registry   Lorin Picket, [place] 08/08/17 1819

## 2017-08-10 DIAGNOSIS — F325 Major depressive disorder, single episode, in full remission: Secondary | ICD-10-CM | POA: Diagnosis not present

## 2017-08-10 DIAGNOSIS — G4733 Obstructive sleep apnea (adult) (pediatric): Secondary | ICD-10-CM | POA: Diagnosis not present

## 2017-08-10 DIAGNOSIS — E1169 Type 2 diabetes mellitus with other specified complication: Secondary | ICD-10-CM | POA: Diagnosis not present

## 2017-08-10 DIAGNOSIS — I779 Disorder of arteries and arterioles, unspecified: Secondary | ICD-10-CM | POA: Diagnosis not present

## 2017-08-10 DIAGNOSIS — E039 Hypothyroidism, unspecified: Secondary | ICD-10-CM | POA: Diagnosis not present

## 2017-08-10 DIAGNOSIS — E1122 Type 2 diabetes mellitus with diabetic chronic kidney disease: Secondary | ICD-10-CM | POA: Diagnosis not present

## 2017-08-10 DIAGNOSIS — Z9989 Dependence on other enabling machines and devices: Secondary | ICD-10-CM | POA: Diagnosis not present

## 2017-08-10 DIAGNOSIS — Z794 Long term (current) use of insulin: Secondary | ICD-10-CM | POA: Diagnosis not present

## 2017-08-10 DIAGNOSIS — E785 Hyperlipidemia, unspecified: Secondary | ICD-10-CM | POA: Diagnosis not present

## 2017-08-10 DIAGNOSIS — I10 Essential (primary) hypertension: Secondary | ICD-10-CM | POA: Diagnosis not present

## 2017-08-10 DIAGNOSIS — N183 Chronic kidney disease, stage 3 (moderate): Secondary | ICD-10-CM | POA: Diagnosis not present

## 2017-08-14 DIAGNOSIS — M19041 Primary osteoarthritis, right hand: Secondary | ICD-10-CM | POA: Diagnosis not present

## 2017-08-14 DIAGNOSIS — M47816 Spondylosis without myelopathy or radiculopathy, lumbar region: Secondary | ICD-10-CM | POA: Diagnosis not present

## 2017-08-14 DIAGNOSIS — M19042 Primary osteoarthritis, left hand: Secondary | ICD-10-CM | POA: Diagnosis not present

## 2017-09-25 DIAGNOSIS — F331 Major depressive disorder, recurrent, moderate: Secondary | ICD-10-CM | POA: Diagnosis not present

## 2017-11-14 DIAGNOSIS — E1165 Type 2 diabetes mellitus with hyperglycemia: Secondary | ICD-10-CM | POA: Diagnosis not present

## 2017-11-14 DIAGNOSIS — N183 Chronic kidney disease, stage 3 (moderate): Secondary | ICD-10-CM | POA: Diagnosis not present

## 2017-11-14 DIAGNOSIS — E039 Hypothyroidism, unspecified: Secondary | ICD-10-CM | POA: Diagnosis not present

## 2017-11-14 DIAGNOSIS — E1122 Type 2 diabetes mellitus with diabetic chronic kidney disease: Secondary | ICD-10-CM | POA: Diagnosis not present

## 2017-11-14 DIAGNOSIS — Z794 Long term (current) use of insulin: Secondary | ICD-10-CM | POA: Diagnosis not present

## 2017-11-15 DIAGNOSIS — L900 Lichen sclerosus et atrophicus: Secondary | ICD-10-CM | POA: Diagnosis not present

## 2017-11-15 DIAGNOSIS — R339 Retention of urine, unspecified: Secondary | ICD-10-CM | POA: Diagnosis not present

## 2017-11-30 DIAGNOSIS — E039 Hypothyroidism, unspecified: Secondary | ICD-10-CM | POA: Diagnosis not present

## 2017-11-30 DIAGNOSIS — I10 Essential (primary) hypertension: Secondary | ICD-10-CM | POA: Diagnosis not present

## 2017-11-30 DIAGNOSIS — E1122 Type 2 diabetes mellitus with diabetic chronic kidney disease: Secondary | ICD-10-CM | POA: Diagnosis not present

## 2017-11-30 DIAGNOSIS — E1169 Type 2 diabetes mellitus with other specified complication: Secondary | ICD-10-CM | POA: Diagnosis not present

## 2017-11-30 DIAGNOSIS — Z794 Long term (current) use of insulin: Secondary | ICD-10-CM | POA: Diagnosis not present

## 2017-11-30 DIAGNOSIS — E785 Hyperlipidemia, unspecified: Secondary | ICD-10-CM | POA: Diagnosis not present

## 2017-11-30 DIAGNOSIS — N183 Chronic kidney disease, stage 3 (moderate): Secondary | ICD-10-CM | POA: Diagnosis not present

## 2017-12-07 DIAGNOSIS — E039 Hypothyroidism, unspecified: Secondary | ICD-10-CM | POA: Diagnosis not present

## 2017-12-07 DIAGNOSIS — Z794 Long term (current) use of insulin: Secondary | ICD-10-CM | POA: Diagnosis not present

## 2017-12-07 DIAGNOSIS — N183 Chronic kidney disease, stage 3 (moderate): Secondary | ICD-10-CM | POA: Diagnosis not present

## 2017-12-07 DIAGNOSIS — R296 Repeated falls: Secondary | ICD-10-CM | POA: Diagnosis not present

## 2017-12-07 DIAGNOSIS — Z23 Encounter for immunization: Secondary | ICD-10-CM | POA: Diagnosis not present

## 2017-12-07 DIAGNOSIS — E1122 Type 2 diabetes mellitus with diabetic chronic kidney disease: Secondary | ICD-10-CM | POA: Diagnosis not present

## 2017-12-07 DIAGNOSIS — Z0000 Encounter for general adult medical examination without abnormal findings: Secondary | ICD-10-CM | POA: Diagnosis not present

## 2017-12-07 DIAGNOSIS — F325 Major depressive disorder, single episode, in full remission: Secondary | ICD-10-CM | POA: Diagnosis not present

## 2017-12-07 DIAGNOSIS — E785 Hyperlipidemia, unspecified: Secondary | ICD-10-CM | POA: Diagnosis not present

## 2017-12-07 DIAGNOSIS — E1169 Type 2 diabetes mellitus with other specified complication: Secondary | ICD-10-CM | POA: Diagnosis not present

## 2017-12-07 DIAGNOSIS — Z9989 Dependence on other enabling machines and devices: Secondary | ICD-10-CM | POA: Diagnosis not present

## 2017-12-07 DIAGNOSIS — G4733 Obstructive sleep apnea (adult) (pediatric): Secondary | ICD-10-CM | POA: Diagnosis not present

## 2017-12-07 DIAGNOSIS — I10 Essential (primary) hypertension: Secondary | ICD-10-CM | POA: Diagnosis not present

## 2017-12-07 DIAGNOSIS — I739 Peripheral vascular disease, unspecified: Secondary | ICD-10-CM | POA: Diagnosis not present

## 2018-01-03 DIAGNOSIS — E039 Hypothyroidism, unspecified: Secondary | ICD-10-CM | POA: Diagnosis not present

## 2018-01-05 ENCOUNTER — Other Ambulatory Visit: Payer: Self-pay

## 2018-01-05 ENCOUNTER — Inpatient Hospital Stay: Payer: Medicare Other | Attending: Internal Medicine

## 2018-01-05 DIAGNOSIS — E119 Type 2 diabetes mellitus without complications: Secondary | ICD-10-CM | POA: Diagnosis not present

## 2018-01-05 DIAGNOSIS — D472 Monoclonal gammopathy: Secondary | ICD-10-CM | POA: Diagnosis not present

## 2018-01-05 DIAGNOSIS — N189 Chronic kidney disease, unspecified: Secondary | ICD-10-CM | POA: Insufficient documentation

## 2018-01-05 DIAGNOSIS — Z79899 Other long term (current) drug therapy: Secondary | ICD-10-CM | POA: Insufficient documentation

## 2018-01-05 DIAGNOSIS — Z87891 Personal history of nicotine dependence: Secondary | ICD-10-CM | POA: Diagnosis not present

## 2018-01-05 DIAGNOSIS — I10 Essential (primary) hypertension: Secondary | ICD-10-CM | POA: Insufficient documentation

## 2018-01-05 DIAGNOSIS — Z794 Long term (current) use of insulin: Secondary | ICD-10-CM | POA: Diagnosis not present

## 2018-01-05 LAB — CBC WITH DIFFERENTIAL/PLATELET
Abs Immature Granulocytes: 0.01 K/uL (ref 0.00–0.07)
Basophils Absolute: 0 K/uL (ref 0.0–0.1)
Basophils Relative: 1 %
Eosinophils Absolute: 0.1 K/uL (ref 0.0–0.5)
Eosinophils Relative: 3 %
HCT: 39.2 % (ref 36.0–46.0)
Hemoglobin: 12.5 g/dL (ref 12.0–15.0)
Immature Granulocytes: 0 %
Lymphocytes Relative: 35 %
Lymphs Abs: 1.1 K/uL (ref 0.7–4.0)
MCH: 28.9 pg (ref 26.0–34.0)
MCHC: 31.9 g/dL (ref 30.0–36.0)
MCV: 90.5 fL (ref 80.0–100.0)
Monocytes Absolute: 0.3 K/uL (ref 0.1–1.0)
Monocytes Relative: 10 %
Neutro Abs: 1.6 K/uL — ABNORMAL LOW (ref 1.7–7.7)
Neutrophils Relative %: 51 %
Platelets: 171 K/uL (ref 150–400)
RBC: 4.33 MIL/uL (ref 3.87–5.11)
RDW: 13.1 % (ref 11.5–15.5)
WBC: 3.1 K/uL — ABNORMAL LOW (ref 4.0–10.5)
nRBC: 0 % (ref 0.0–0.2)

## 2018-01-05 LAB — COMPREHENSIVE METABOLIC PANEL WITH GFR
ALT: 14 U/L (ref 0–44)
AST: 25 U/L (ref 15–41)
Albumin: 4 g/dL (ref 3.5–5.0)
Alkaline Phosphatase: 75 U/L (ref 38–126)
Anion gap: 7 (ref 5–15)
BUN: 27 mg/dL — ABNORMAL HIGH (ref 8–23)
CO2: 30 mmol/L (ref 22–32)
Calcium: 9.4 mg/dL (ref 8.9–10.3)
Chloride: 102 mmol/L (ref 98–111)
Creatinine, Ser: 1.59 mg/dL — ABNORMAL HIGH (ref 0.44–1.00)
GFR calc Af Amer: 37 mL/min — ABNORMAL LOW (ref >60)
GFR calc non Af Amer: 32 mL/min — ABNORMAL LOW (ref >60)
Glucose, Bld: 255 mg/dL — ABNORMAL HIGH (ref 70–99)
Potassium: 4.5 mmol/L (ref 3.5–5.1)
Sodium: 139 mmol/L (ref 135–145)
Total Bilirubin: 0.5 mg/dL (ref 0.3–1.2)
Total Protein: 6.9 g/dL (ref 6.5–8.1)

## 2018-01-08 LAB — KAPPA/LAMBDA LIGHT CHAINS
KAPPA, LAMDA LIGHT CHAIN RATIO: 1.54 (ref 0.26–1.65)
Kappa free light chain: 34.2 mg/L — ABNORMAL HIGH (ref 3.3–19.4)
LAMDA FREE LIGHT CHAINS: 22.2 mg/L (ref 5.7–26.3)

## 2018-01-09 LAB — MULTIPLE MYELOMA PANEL, SERUM
ALBUMIN SERPL ELPH-MCNC: 3.5 g/dL (ref 2.9–4.4)
ALPHA 1: 0.2 g/dL (ref 0.0–0.4)
Albumin/Glob SerPl: 1.3 (ref 0.7–1.7)
Alpha2 Glob SerPl Elph-Mcnc: 0.8 g/dL (ref 0.4–1.0)
B-Globulin SerPl Elph-Mcnc: 0.9 g/dL (ref 0.7–1.3)
Gamma Glob SerPl Elph-Mcnc: 0.7 g/dL (ref 0.4–1.8)
Globulin, Total: 2.7 g/dL (ref 2.2–3.9)
IGA: 135 mg/dL (ref 64–422)
IGM (IMMUNOGLOBULIN M), SRM: 64 mg/dL (ref 26–217)
IgG (Immunoglobin G), Serum: 895 mg/dL (ref 700–1600)
M Protein SerPl Elph-Mcnc: 0.4 g/dL — ABNORMAL HIGH
Total Protein ELP: 6.2 g/dL (ref 6.0–8.5)

## 2018-01-12 ENCOUNTER — Inpatient Hospital Stay: Payer: Medicare Other | Admitting: Internal Medicine

## 2018-01-16 ENCOUNTER — Other Ambulatory Visit: Payer: Self-pay

## 2018-01-16 ENCOUNTER — Encounter: Payer: Self-pay | Admitting: Internal Medicine

## 2018-01-16 ENCOUNTER — Inpatient Hospital Stay (HOSPITAL_BASED_OUTPATIENT_CLINIC_OR_DEPARTMENT_OTHER): Payer: Medicare Other | Admitting: Internal Medicine

## 2018-01-16 VITALS — BP 189/83 | HR 96 | Temp 96.7°F | Resp 22 | Ht 61.0 in | Wt 176.0 lb

## 2018-01-16 DIAGNOSIS — Z794 Long term (current) use of insulin: Secondary | ICD-10-CM

## 2018-01-16 DIAGNOSIS — I10 Essential (primary) hypertension: Secondary | ICD-10-CM

## 2018-01-16 DIAGNOSIS — Z79899 Other long term (current) drug therapy: Secondary | ICD-10-CM

## 2018-01-16 DIAGNOSIS — N189 Chronic kidney disease, unspecified: Secondary | ICD-10-CM | POA: Diagnosis not present

## 2018-01-16 DIAGNOSIS — Z87891 Personal history of nicotine dependence: Secondary | ICD-10-CM | POA: Diagnosis not present

## 2018-01-16 DIAGNOSIS — D472 Monoclonal gammopathy: Secondary | ICD-10-CM | POA: Diagnosis not present

## 2018-01-16 DIAGNOSIS — E119 Type 2 diabetes mellitus without complications: Secondary | ICD-10-CM

## 2018-01-16 NOTE — Progress Notes (Signed)
[place] OFFICE PROGRESS NOTE  Patient Care Team: Kirk Ruths, MD as PCP - General (Internal Medicine)  No matching staging information was found for the patient.    No history exists.    # MGUS M protein 0.5 g bone marrow biopsy October 2016 [Dr.Pandit]- no myeloma cells/? Hemodilution. [IgGLamda]  # CKD [creat- 1.4; Dr.Kolluru]   # Mild neutropenia/leucopenia   INTERVAL HISTORY:  Abigail Walker 72 y.o.  female pleasant patient above history of MGUS is here for follow-up.  Patient has not had any hospitalizations.  Denies any worsening swelling in the legs.  Denies any unusual tingling or numbness.  No nausea no vomiting pain no headaches.  No chest pain or shortness with cough.   Review of Systems  Constitutional: Negative for chills, diaphoresis, fever, malaise/fatigue and weight loss.  HENT: Negative for nosebleeds and sore throat.   Eyes: Negative for double vision.  Respiratory: Negative for cough, hemoptysis, sputum production, shortness of breath and wheezing.   Cardiovascular: Positive for leg swelling. Negative for chest pain, palpitations and orthopnea.  Gastrointestinal: Negative for abdominal pain, blood in stool, constipation, diarrhea, heartburn, melena, nausea and vomiting.  Genitourinary: Negative for dysuria, frequency and urgency.  Musculoskeletal: Negative for back pain and joint pain.  Skin: Negative.  Negative for itching and rash.  Neurological: Negative for dizziness, tingling, focal weakness, weakness and headaches.  Endo/Heme/Allergies: Does not bruise/bleed easily.  Psychiatric/Behavioral: Negative for depression. The patient is not nervous/anxious and does not have insomnia.      PAST MEDICAL HISTORY :  Past Medical History:  Diagnosis Date  . Acid reflux 06/16/2013  . Adult hypothyroidism 06/16/2013   Last Assessment & Plan:  Tsh and energy have been stable.    . Benign hypertension 06/16/2013   Last Assessment &  Plan:  Patients blood pressure has seemingly been controlled without significant side effects such as dizziness or slow heart rate.     . Cancer ([place]) skin  . Chronic kidney disease   . Depression   . Diabetes mellitus without complication ([place])   . GERD (gastroesophageal reflux disease)   . HBV (hepatitis B virus) infection   . High blood pressure   . Hives    food related  . Hypothyroidism   . Mixed incontinence   . Obstructive apnea 06/16/2013  . Sleep apnea   . Thyroid disease     PAST SURGICAL HISTORY :   Past Surgical History:  Procedure Laterality Date  . CATARACT EXTRACTION Bilateral   . ESOPHAGOGASTRODUODENOSCOPY (EGD) WITH PROPOFOL N/A 06/08/2015   Procedure: ESOPHAGOGASTRODUODENOSCOPY (EGD) WITH PROPOFOL;  Surgeon: Hulen Luster, MD;  Location: [facility] ENDOSCOPY;  Service: Gastroenterology;  Laterality: N/A;  . HEMORRHOID SURGERY    . IMPLANTABLE CONTACT LENS IMPLANTATION    . TONSILLECTOMY    . TUBAL LIGATION      FAMILY HISTORY :   Family History  Problem Relation Age of Onset  . Heart disease Father   . Cancer Mother   . Kidney failure Maternal Aunt     SOCIAL HISTORY:   Social History   Tobacco Use  . Smoking status: Former Smoker    Last attempt to quit: 07/27/1993    Years since quitting: 24.4  . Smokeless tobacco: Never Used  . Tobacco comment: quit 20 years  Substance Use Topics  . Alcohol use: No    Alcohol/week: 0.0 standard drinks  . Drug use: No    ALLERGIES:  is allergic to sulfa  antibiotics; amoxicillin-pot clavulanate; and hydrocortisone-iodoquinol  [iodoquinol-hc].  MEDICATIONS:  Current Outpatient Medications  Medication Sig Dispense Refill  . acetaminophen (TYLENOL) 500 MG tablet Take 500 mg by mouth every 6 (six) hours as needed for moderate pain.    Marland Kitchen azelastine (ASTELIN) 0.1 % nasal spray Place into the nose.    . Biotin w/ Vitamins C & E (HAIR/SKIN/NAILS PO) Take 1 tablet by mouth daily.    Marland Kitchen buPROPion (WELLBUTRIN XL) 150 MG 24 hr  tablet Take 3 tablet po QD as directed    . Cholecalciferol (VITAMIN D3) 2000 UNITS capsule Take by mouth.    . clotrimazole (MYCELEX) 10 MG troche Take 10 mg by mouth 5 (five) times daily. Reported on 07/08/2015    . ferrous sulfate 325 (65 FE) MG tablet Take 325 mg by mouth daily with breakfast.    . fluticasone (FLONASE) 50 MCG/ACT nasal spray Place into the nose.    . insulin NPH-regular Human (NOVOLIN 70/30) (70-30) 100 UNIT/ML injection Inject 16 units before breakfast and 26 units before supper as directed    . Insulin Syringe-Needle U-100 (INSULIN SYRINGE 1CC/31GX5/16") 31G X 5/16" 1 ML MISC USE ONE SYRINGE TWICE DAILY AS DIRECTED WITH INSULIN VIALS    . Iodoquinol-HC-Aloe Polysacch (ALCORTIN A) 1-2-1 % GEL Apply 1 application topically at bedtime.  6  . levothyroxine (SYNTHROID, LEVOTHROID) 75 MCG tablet TAKE ONE TABLET BY MOUTH ONCE DAILY    . Multiple Vitamins-Minerals (HAIR SKIN AND NAILS FORMULA PO) Take by mouth.    . nystatin cream (MYCOSTATIN) Apply 1 application topically as needed.    . pantoprazole (PROTONIX) 40 MG tablet Take 40 mg by mouth daily.     Marland Kitchen PARoxetine (PAXIL) 40 MG tablet Take 40 mg by mouth every morning.     . pravastatin (PRAVACHOL) 80 MG tablet Take 80 mg by mouth daily.     Marland Kitchen torsemide (DEMADEX) 10 MG tablet Take 1 tablet by mouth daily.     No current facility-administered medications for this visit.     PHYSICAL EXAMINATION: ECOG PERFORMANCE STATUS: 0 - Asymptomatic  BP (!) 189/83 (Patient Position: Sitting)   Pulse 96   Temp (!) 96.7 F (35.9 C) (Tympanic)   Resp (!) 22   Ht _0  (1.549 m)   Wt 176 lb (79.8 kg)   BMI 33.25 kg/m   Filed Weights   01/16/18 1339  Weight: 176 lb (79.8 kg)    Physical Exam  Constitutional: She is oriented to person, place, and time and well-developed, well-nourished, and in no distress.  HENT:  Head: Normocephalic and atraumatic.  Mouth/Throat: Oropharynx is clear and moist. No oropharyngeal exudate.   Eyes: Pupils are equal, round, and reactive to light.  Neck: Normal range of motion. Neck supple.  Cardiovascular: Normal rate and regular rhythm.  Pulmonary/Chest: No respiratory distress. She has no wheezes.  Abdominal: Soft. Bowel sounds are normal. She exhibits no distension and no mass. There is no abdominal tenderness. There is no rebound and no guarding.  Musculoskeletal: Normal range of motion.        General: No tenderness or edema.  Neurological: She is alert and oriented to person, place, and time.  Skin: Skin is warm.  Psychiatric: Affect normal.     LABORATORY DATA:  I have reviewed the data as listed    Component Value Date/Time   NA 139 01/05/2018 1352   NA 140 05/19/2011 0409   K 4.5 01/05/2018 1352   K 4.2 05/19/2011 0409  CL 102 01/05/2018 1352   CL 105 05/19/2011 0409   CO2 30 01/05/2018 1352   CO2 28 05/19/2011 0409   GLUCOSE 255 (H) 01/05/2018 1352   GLUCOSE 158 (H) 05/19/2011 0409   BUN 27 (H) 01/05/2018 1352   BUN 15 05/19/2011 0409   CREATININE 1.59 (H) 01/05/2018 1352   CREATININE 1.18 05/19/2011 0409   CALCIUM 9.4 01/05/2018 1352   CALCIUM 9.2 05/19/2011 0409   PROT 6.9 01/05/2018 1352   PROT 7.1 05/14/2011 1432   ALBUMIN 4.0 01/05/2018 1352   ALBUMIN 3.2 (L) 05/14/2011 1432   AST 25 01/05/2018 1352   AST 20 05/14/2011 1432   ALT 14 01/05/2018 1352   ALT 20 05/14/2011 1432   ALKPHOS 75 01/05/2018 1352   ALKPHOS 88 05/14/2011 1432   BILITOT 0.5 01/05/2018 1352   BILITOT 0.3 05/14/2011 1432   GFRNONAA 32 (L) 01/05/2018 1352   GFRNONAA 48 (L) 05/19/2011 0409   GFRAA 37 (L) 01/05/2018 1352   GFRAA 56 (L) 05/19/2011 0409    No results found for: SPEP, UPEP  Lab Results  Component Value Date   WBC 3.1 (L) 01/05/2018   NEUTROABS 1.6 (L) 01/05/2018   HGB 12.5 01/05/2018   HCT 39.2 01/05/2018   MCV 90.5 01/05/2018   PLT 171 01/05/2018      Chemistry      Component Value Date/Time   NA 139 01/05/2018 1352   NA 140 05/19/2011 0409    K 4.5 01/05/2018 1352   K 4.2 05/19/2011 0409   CL 102 01/05/2018 1352   CL 105 05/19/2011 0409   CO2 30 01/05/2018 1352   CO2 28 05/19/2011 0409   BUN 27 (H) 01/05/2018 1352   BUN 15 05/19/2011 0409   CREATININE 1.59 (H) 01/05/2018 1352   CREATININE 1.18 05/19/2011 0409      Component Value Date/Time   CALCIUM 9.4 01/05/2018 1352   CALCIUM 9.2 05/19/2011 0409   ALKPHOS 75 01/05/2018 1352   ALKPHOS 88 05/14/2011 1432   AST 25 01/05/2018 1352   AST 20 05/14/2011 1432   ALT 14 01/05/2018 1352   ALT 20 05/14/2011 1432   BILITOT 0.5 01/05/2018 1352   BILITOT 0.3 05/14/2011 1432       RADIOGRAPHIC STUDIES: I have personally reviewed the radiological images as listed and agreed with the findings in the report. No results found.   ASSESSMENT & PLAN:  MGUS (monoclonal gammopathy of unknown significance) Monoclonal gammopathy of unknown significance; IgG Lamda-December 2019 M protein 0.44 g/dL.  Kappa lambda light chain ratio slightly abnormal.  No clinical concerns for progression.  Discussed with the patient that the risk of progression is about 1 %/year.  # Mild leukopenia white count of 2.9-3.1 ANC 1.2-1.6.  Question autoimmune versus others.  Asymptomatic.  Overall stable.  Monitor for now.  # CKD- stage III [creat 1.5]; stable- follows up with Dr.Kolluru  #Hypertension blood pressure in 180s; states blood pressure is better controlled at home.  Recommend keeping a log; and if elevated defer to PCP.  # DISPOSITION: # Recommend follow-up in 12 months- Dr.Cororcoran-labs CBC CMP myeloma work up-kappa-lamda light chains- 1 week prior.   No orders of the defined types were placed in this encounter.  All questions were answered. The patient knows to call the clinic with any problems, questions or concerns.      Cammie Sickle, MD 01/16/2018 2:17 PM

## 2018-01-16 NOTE — Assessment & Plan Note (Addendum)
Monoclonal gammopathy of unknown significance; IgG Lamda-December 2019 M protein 0.44 g/dL.  Kappa lambda light chain ratio slightly abnormal.  No clinical concerns for progression.  Discussed with the patient that the risk of progression is about 1 %/year.  # Mild leukopenia white count of 2.9-3.1 ANC 1.2-1.6.  Question autoimmune versus others.  Asymptomatic.  Overall stable.  Monitor for now.  # CKD- stage III [creat 1.5]; stable- follows up with Dr.Kolluru  #Hypertension blood pressure in 180s; states blood pressure is better controlled at home.  Recommend keeping a log; and if elevated defer to PCP.  # DISPOSITION: # Recommend follow-up in 12 months- Dr.Cororcoran-labs CBC CMP myeloma work up-kappa-lamda light chains- 1 week prior.

## 2018-03-12 ENCOUNTER — Other Ambulatory Visit: Payer: Self-pay | Admitting: Neurology

## 2018-03-12 DIAGNOSIS — R440 Auditory hallucinations: Secondary | ICD-10-CM

## 2018-03-21 ENCOUNTER — Other Ambulatory Visit: Payer: Self-pay

## 2018-03-21 ENCOUNTER — Ambulatory Visit
Admission: RE | Admit: 2018-03-21 | Discharge: 2018-03-21 | Disposition: A | Discharge status: Home / Self Care | Payer: Medicare Other | Source: Ambulatory Visit | Attending: Neurology | Admitting: Neurology

## 2018-03-21 DIAGNOSIS — R440 Auditory hallucinations: Secondary | ICD-10-CM | POA: Diagnosis not present

## 2018-10-27 ENCOUNTER — Emergency Department
Admission: EM | Admit: 2018-10-27 | Discharge: 2018-10-27 | Disposition: A | Discharge status: Home / Self Care | Payer: Medicare Other | Attending: Emergency Medicine | Admitting: Emergency Medicine

## 2018-10-27 ENCOUNTER — Emergency Department: Payer: Medicare Other

## 2018-10-27 ENCOUNTER — Other Ambulatory Visit: Payer: Self-pay

## 2018-10-27 ENCOUNTER — Ambulatory Visit (INDEPENDENT_AMBULATORY_CARE_PROVIDER_SITE_OTHER)
Admission: EM | Admit: 2018-10-27 | Discharge: 2018-10-27 | Disposition: A | Discharge status: Home / Self Care | Payer: Medicare Other | Source: Home / Self Care

## 2018-10-27 ENCOUNTER — Encounter: Payer: Self-pay | Admitting: Emergency Medicine

## 2018-10-27 DIAGNOSIS — Y929 Unspecified place or not applicable: Secondary | ICD-10-CM | POA: Diagnosis not present

## 2018-10-27 DIAGNOSIS — Z87891 Personal history of nicotine dependence: Secondary | ICD-10-CM | POA: Diagnosis not present

## 2018-10-27 DIAGNOSIS — E039 Hypothyroidism, unspecified: Secondary | ICD-10-CM | POA: Insufficient documentation

## 2018-10-27 DIAGNOSIS — Y939 Activity, unspecified: Secondary | ICD-10-CM | POA: Diagnosis not present

## 2018-10-27 DIAGNOSIS — R739 Hyperglycemia, unspecified: Secondary | ICD-10-CM

## 2018-10-27 DIAGNOSIS — R519 Headache, unspecified: Secondary | ICD-10-CM | POA: Diagnosis not present

## 2018-10-27 DIAGNOSIS — I129 Hypertensive chronic kidney disease with stage 1 through stage 4 chronic kidney disease, or unspecified chronic kidney disease: Secondary | ICD-10-CM | POA: Insufficient documentation

## 2018-10-27 DIAGNOSIS — Y999 Unspecified external cause status: Secondary | ICD-10-CM | POA: Diagnosis not present

## 2018-10-27 DIAGNOSIS — S0181XA Laceration without foreign body of other part of head, initial encounter: Secondary | ICD-10-CM | POA: Insufficient documentation

## 2018-10-27 DIAGNOSIS — S0083XA Contusion of other part of head, initial encounter: Secondary | ICD-10-CM | POA: Diagnosis not present

## 2018-10-27 DIAGNOSIS — W19XXXA Unspecified fall, initial encounter: Secondary | ICD-10-CM

## 2018-10-27 DIAGNOSIS — R000 Tachycardia, unspecified: Secondary | ICD-10-CM | POA: Diagnosis not present

## 2018-10-27 DIAGNOSIS — Z79899 Other long term (current) drug therapy: Secondary | ICD-10-CM | POA: Diagnosis not present

## 2018-10-27 DIAGNOSIS — E1122 Type 2 diabetes mellitus with diabetic chronic kidney disease: Secondary | ICD-10-CM | POA: Insufficient documentation

## 2018-10-27 DIAGNOSIS — N189 Chronic kidney disease, unspecified: Secondary | ICD-10-CM | POA: Diagnosis not present

## 2018-10-27 DIAGNOSIS — W06XXXA Fall from bed, initial encounter: Secondary | ICD-10-CM | POA: Diagnosis not present

## 2018-10-27 DIAGNOSIS — S0990XA Unspecified injury of head, initial encounter: Secondary | ICD-10-CM

## 2018-10-27 LAB — URINALYSIS, COMPLETE (UACMP) WITH MICROSCOPIC
Bilirubin Urine: NEGATIVE
Glucose, UA: >=500 mg/dL — AB
Hgb urine dipstick: NEGATIVE
Ketones, ur: 5 mg/dL — AB
Nitrite: NEGATIVE
Protein, ur: NEGATIVE mg/dL
Specific Gravity, Urine: 1.023 (ref 1.005–1.030)
pH: 6 (ref 5.0–8.0)

## 2018-10-27 LAB — BASIC METABOLIC PANEL
Anion gap: 8 (ref 5–15)
BUN: 25 mg/dL — ABNORMAL HIGH (ref 8–23)
CO2: 26 mmol/L (ref 22–32)
Calcium: 9.2 mg/dL (ref 8.9–10.3)
Chloride: 103 mmol/L (ref 98–111)
Creatinine, Ser: 1.4 mg/dL — ABNORMAL HIGH (ref 0.44–1.00)
GFR calc Af Amer: 43 mL/min — ABNORMAL LOW (ref >60)
GFR calc non Af Amer: 37 mL/min — ABNORMAL LOW (ref >60)
Glucose, Bld: 335 mg/dL — ABNORMAL HIGH (ref 70–99)
Potassium: 5 mmol/L (ref 3.5–5.1)
Sodium: 137 mmol/L (ref 135–145)

## 2018-10-27 LAB — CBC
HCT: 37.2 % (ref 36.0–46.0)
Hemoglobin: 12.4 g/dL (ref 12.0–15.0)
MCH: 29.5 pg (ref 26.0–34.0)
MCHC: 33.3 g/dL (ref 30.0–36.0)
MCV: 88.6 fL (ref 80.0–100.0)
Platelets: 143 10*3/uL — ABNORMAL LOW (ref 150–400)
RBC: 4.2 MIL/uL (ref 3.87–5.11)
RDW: 12.6 % (ref 11.5–15.5)
WBC: 4.7 10*3/uL (ref 4.0–10.5)
nRBC: 0 % (ref 0.0–0.2)

## 2018-10-27 LAB — GLUCOSE, CAPILLARY
Glucose-Capillary: 371 mg/dL — ABNORMAL HIGH (ref 70–99)
Glucose-Capillary: 332 mg/dL — ABNORMAL HIGH (ref 70–99)

## 2018-10-27 MED ORDER — LIDOCAINE-EPINEPHRINE (PF) 2 %-1:200000 IJ SOLN
20.0000 mL | Freq: Once | INTRAMUSCULAR | Status: AC
  Administered 2018-10-27: 20 mL via INTRADERMAL
  Filled 2018-10-27: qty 20

## 2018-10-27 NOTE — Discharge Instructions (Signed)
EMS to take you to [facility] ER for further evaluation and management of your current condition.   Honor Loh, MSN, APRN, FNP-C, CEN Advanced Practice Provider [facility]

## 2018-10-27 NOTE — ED Notes (Signed)
ED Provider at bedside. 

## 2018-10-27 NOTE — ED Triage Notes (Signed)
Pt to ED via EMS from [place] c/o fall and hypoglycemia.  States blood sugar must have dropped last night some time and woke up this morning on floor in a pool of blood.  Pt has abrasions to nose, right eye and head, laceration to right forehead that is wrapped by EMS.  Pt denies blood thinners, presents A&Ox4, speaking in complete and coherent sentences, in NAD at this time.

## 2018-10-27 NOTE — ED Provider Notes (Signed)
[facility] Emergency Department Provider Note ____________________________________________   First MD Initiated Contact with Patient 10/27/18 1544     (approximate)  I have reviewed the triage vital signs and the nursing notes.   HISTORY  Chief Complaint Fall and Hypoglycemia    HPI Abigail Walker is a 73 y.o. female with PMH as noted below who presents with a head injury after an apparent fall out of bed.  The patient states that sometime early this morning but when it was already light out she awoke on the floor.  She believes that her blood sugar dropped too low.  She takes insulin twice daily, including it in the evening if she has supper.  She states she took her normal dose last night.  She reports some neck pain as well as muscle pain in the left side of her back.  She states otherwise she has been in her usual state of health.  She is not on anticoagulation or antiplatelets.  Past Medical History:  Diagnosis Date  . Acid reflux 06/16/2013  . Adult hypothyroidism 06/16/2013   Last Assessment & Plan:  Tsh and energy have been stable.    . Benign hypertension 06/16/2013   Last Assessment & Plan:  Patients blood pressure has seemingly been controlled without significant side effects such as dizziness or slow heart rate.     . Cancer ([place]) skin  . Chronic kidney disease   . Depression   . Diabetes mellitus without complication ([place])   . GERD (gastroesophageal reflux disease)   . HBV (hepatitis B virus) infection   . High blood pressure   . Hives    food related  . Hypothyroidism   . Mixed incontinence   . Obstructive apnea 06/16/2013  . Sleep apnea   . Thyroid disease     Patient Active Problem List   Diagnosis Date Noted  . MGUS (monoclonal gammopathy of unknown significance) 07/08/2015  . Mixed incontinence 11/09/2014  . Atrophic vaginitis 07/28/2014  . Encounter for general adult medical examination without abnormal findings 05/28/2014  .  Major depression in remission ([place]) 11/27/2013  . Morbid obesity ([place]) 11/16/2013  . Microalbuminuria 08/06/2013  . Breath shortness 08/02/2013  . Acid reflux 06/16/2013  . Benign hypertension 06/16/2013  . Type 2 diabetes mellitus ([place]) 06/16/2013  . Adult hypothyroidism 06/16/2013  . Obstructive apnea 06/16/2013    Past Surgical History:  Procedure Laterality Date  . CATARACT EXTRACTION Bilateral   . ESOPHAGOGASTRODUODENOSCOPY (EGD) WITH PROPOFOL N/A 06/08/2015   Procedure: ESOPHAGOGASTRODUODENOSCOPY (EGD) WITH PROPOFOL;  Surgeon: Hulen Luster, MD;  Location: [facility] ENDOSCOPY;  Service: Gastroenterology;  Laterality: N/A;  . HEMORRHOID SURGERY    . IMPLANTABLE CONTACT LENS IMPLANTATION    . TONSILLECTOMY    . TUBAL LIGATION      Prior to Admission medications   Medication Sig Start Date End Date Taking? Authorizing Provider  acetaminophen (TYLENOL) 500 MG tablet Take 500 mg by mouth every 6 (six) hours as needed for moderate pain.    [provider]  azelastine (ASTELIN) 0.1 % nasal spray Place into the nose.    [provider]  Biotin w/ Vitamins C & E (HAIR/SKIN/NAILS PO) Take 1 tablet by mouth daily.    [provider]  buPROPion (WELLBUTRIN XL) 150 MG 24 hr tablet Take 3 tablet po QD as directed    [provider]  Cholecalciferol (VITAMIN D3) 2000 UNITS capsule Take by mouth.    [provider]  ferrous sulfate 325 (65 FE) MG tablet Take 325 mg by mouth daily with breakfast.    [provider]  fluticasone (FLONASE) 50 MCG/ACT nasal spray Place into the nose. 11/27/13 10/27/18  [provider]  insulin NPH-regular Human (NOVOLIN 70/30) (70-30) 100 UNIT/ML injection Inject 16 units before breakfast and 26 units before supper as directed 11/13/13   [provider]  Insulin Syringe-Needle U-100 (INSULIN SYRINGE 1CC/31GX5/16") 31G X 5/16" 1 ML MISC USE ONE SYRINGE TWICE DAILY AS DIRECTED WITH INSULIN VIALS 05/26/14    [provider]  Iodoquinol-HC-Aloe Polysacch (ALCORTIN A) 1-2-1 % GEL Apply 1 application topically at bedtime. 11/11/16   [provider]  levothyroxine (SYNTHROID, LEVOTHROID) 75 MCG tablet TAKE ONE TABLET BY MOUTH ONCE DAILY 05/21/14   [provider]  Multiple Vitamins-Minerals (HAIR SKIN AND NAILS FORMULA PO) Take by mouth.    [provider]  nystatin cream (MYCOSTATIN) Apply 1 application topically as needed. 01/08/14   [provider]  pantoprazole (PROTONIX) 40 MG tablet Take 40 mg by mouth daily.  07/07/14   [provider]  PARoxetine (PAXIL) 40 MG tablet Take 40 mg by mouth every morning.     [provider]  pravastatin (PRAVACHOL) 80 MG tablet Take 80 mg by mouth daily.  04/20/15   [provider]  torsemide (DEMADEX) 10 MG tablet Take 1 tablet by mouth daily. 12/20/17 12/20/18  [provider]    Allergies Sulfa antibiotics, Amoxicillin-pot clavulanate, and Hydrocortisone-iodoquinol  [hydrocortisone-iodoquinol]  Family History  Problem Relation Age of Onset  . Heart disease Father   . Cancer Mother   . Kidney failure Maternal Aunt     Social History Social History   Tobacco Use  . Smoking status: Former Smoker    Quit date: 07/27/1993    Years since quitting: 25.2  . Smokeless tobacco: Never Used  . Tobacco comment: quit 20 years  Substance Use Topics  . Alcohol use: No    Alcohol/week: 0.0 standard drinks  . Drug use: No    Review of Systems  Constitutional: No fever. Eyes: No visual changes. ENT: No sore throat. Cardiovascular: Denies chest pain. Respiratory: Denies shortness of breath. Gastrointestinal: No vomiting. Genitourinary: Negative for flank pain.  Musculoskeletal: Positive for back pain. Skin: Negative for rash. Neurological: Positive for headache.   ____________________________________________   PHYSICAL EXAM:  VITAL SIGNS: ED Triage Vitals  Enc Vitals Group      BP 10/27/18 1543 (!) 166/59     Pulse Rate 10/27/18 1543 99     Resp 10/27/18 1543 20     Temp 10/27/18 1543 98.9 F (37.2 C)     Temp Source 10/27/18 1543 Oral     SpO2 10/27/18 1543 98 %     Weight 10/27/18 1544 171 lb (77.6 kg)     Height 10/27/18 1544 5\' 1"  (1.549 m)     Head Circumference --      Peak Flow --      Pain Score 10/27/18 1544 3     Pain Loc --      Pain Edu? --      Excl. in [place]? --     Constitutional: Alert and oriented.  Relatively well appearing and in no acute distress. Eyes: Conjunctivae are normal.  EOMI.  PERRLA. Head: 4 cm laceration to depth of subcu on the right forehead. Nose: No congestion/rhinnorhea.  Abrasion to bridge of nose and bruising around the right orbit. Mouth/Throat: Mucous membranes are slightly dry.  Neck: Normal range of motion.  No midline cervical spinal tenderness. Cardiovascular: Normal rate, regular rhythm.  Good peripheral circulation. Respiratory: Normal respiratory effort.  No retractions.  Gastrointestinal: No distention.  Musculoskeletal: No lower extremity edema.  Extremities warm and well perfused.  Full range of motion in all 4 extremities.  No midline spinal tenderness.  Mild left thoracic paraspinal muscle tenderness. Neurologic:  Normal speech and language.  Motor and sensory intact in all extremities.  Normal coordination.  No gross focal neurologic deficits are appreciated.  Skin:  Skin is warm and dry. No rash noted. Psychiatric: Mood and affect are normal. Speech and behavior are normal.  ____________________________________________   LABS (all labs ordered are listed, but only abnormal results are displayed)  Labs Reviewed  CBC - Abnormal; Notable for the following components:      Result Value   Platelets 143 (*)    All other components within normal limits  URINALYSIS, COMPLETE (UACMP) WITH MICROSCOPIC - Abnormal; Notable for the following components:   Color, Urine YELLOW (*)    APPearance HAZY (*)     Glucose, UA >=500 (*)    Ketones, ur 5 (*)    Leukocytes,Ua MODERATE (*)    Bacteria, UA RARE (*)    All other components within normal limits  GLUCOSE, CAPILLARY - Abnormal; Notable for the following components:   Glucose-Capillary 332 (*)    All other components within normal limits  BASIC METABOLIC PANEL - Abnormal; Notable for the following components:   Glucose, Bld 335 (*)    BUN 25 (*)    Creatinine, Ser 1.40 (*)    GFR calc non Af Amer 37 (*)    GFR calc Af Amer 43 (*)    All other components within normal limits  CBG MONITORING, ED   ____________________________________________  EKG  ED ECG REPORT I, Arta Silence, the attending physician, personally viewed and interpreted this ECG.  Date: 10/27/2018 EKG Time: 1551 Rate: 99 Rhythm: normal sinus rhythm QRS Axis: normal Intervals: normal ST/T Wave abnormalities: normal Narrative Interpretation: no evidence of acute ischemia  ____________________________________________  RADIOLOGY  CT head: No ICH CT cervical spine: No acute fracture CT maxillofacial: No acute fracture  ____________________________________________   PROCEDURES  Procedure(s) performed: Yes   .Marland KitchenLaceration Repair  Date/Time: 10/27/2018 7:07 PM Performed by: Arta Silence, MD Authorized by: Arta Silence, MD   Consent:    Consent obtained:  Verbal   Consent given by:  Patient   Risks discussed:  Infection, pain, retained foreign body, poor cosmetic result and poor wound healing Anesthesia (see MAR for exact dosages):    Anesthesia method:  Local infiltration   Local anesthetic:  Lidocaine 2% WITH epi Laceration details:    Location:  Face   Face location:  Forehead   Length (cm):  4   Depth (mm):  4 Repair type:    Repair type:  Intermediate Exploration:    Hemostasis achieved with:  Direct pressure   Wound exploration: entire depth of wound probed and visualized     Contaminated: no   Treatment:    Area  cleansed with:  Saline and Betadine   Amount of cleaning:  Extensive   Irrigation solution:  Sterile saline   Visualized foreign bodies/material removed: no   Subcutaneous repair:    Suture size:  4-0   Suture material:  Vicryl   Number of sutures:  2 Skin repair:    Repair method:  Sutures   Suture size:  6-0  Suture material:  Nylon Approximation:    Approximation:  Close Post-procedure details:    Dressing:  Sterile dressing   Patient tolerance of procedure:  Tolerated well, no immediate complications    Critical Care performed: No ____________________________________________   INITIAL IMPRESSION / ASSESSMENT AND PLAN / ED COURSE  Pertinent labs & imaging results that were available during my care of the patient were reviewed by me and considered in my medical decision making (see chart for details).  73 year old female with PMH as noted above presents with head injury after she apparently fell out of bed early this morning.  The patient states that she initially did not think she needed to go to the hospital and went back to sleep for a few hours.  Then she had trouble arranging transportation to get herself to urgent care.  The patient believes that she became hypoglycemic during the night.  She states she is now feeling relatively well other than a right-sided headache.  She also reports some mid back pain and some chronic neck pain.  On exam the patient is relatively well-appearing.  Her vital signs are normal except for hypertension.  Neurologic exam is nonfocal.  She has an abrasion to her nasal bridge, some bruising around the right orbit, and a 4 cm laceration of the right forehead.  She has no midline spinal tenderness.  There are no significant extremity injuries.  The patient is somewhat tremulous, but states that this is consistent with her benign essential tremor which is worsened when she is either cold or nervous.  In terms of the head injury, we will obtain CT  head, maxillofacial, and cervical spine based on the patient's age and Nexus criteria.  The patient's blood sugar is now elevated after she drank sweet tea.  However we will obtain basic labs to rule out dehydration, acidosis, or other possible etiologies of weakness.  I anticipate discharge home if the work-up is negative.  ----------------------------------------- 7:08 PM on 10/27/2018 -----------------------------------------  CT imaging is negative.  The lab work-up is unremarkable.  The patient's creatinine is consistent with her baseline.  She is slightly hyperglycemic but there is no evidence of DKA.  The wound has been repaired.  At this time, the patient is stable for discharge home.  She remains awake and alert and is comfortable and well-appearing.  Her vital signs are stable.  Return precautions given, and she expresses understanding.  ____________________________________________   FINAL CLINICAL IMPRESSION(S) / ED DIAGNOSES  Final diagnoses:  Laceration of forehead, initial encounter  Minor head injury, initial encounter      NEW MEDICATIONS STARTED DURING THIS VISIT:  New Prescriptions   No medications on file     Note:  This document was prepared using Dragon voice recognition software and may include unintentional dictation errors.    Arta Silence, MD 10/27/18 [place]ine [place]

## 2018-10-27 NOTE — ED Provider Notes (Addendum)
[place], [place]   Name: Abigail Walker DOB: 06/20/1945 MRN: [id number] CSN: [id number] PCP: Kirk Ruths, MD  Arrival date and time:  10/27/18 1433  Chief Complaint:  Fall   NOTE: Prior to seeing the patient today, I have reviewed the triage nursing documentation and vital signs. Clinical staff has updated patient's PMH/PSHx, current medication list, and drug allergies/intolerances to ensure comprehensive history available to assist in medical decision making.   History:   HPI: Abigail Walker is a 73 y.o. female who presents today via taxi with complaints of facial pain following a fall that occurred at an unknown time. Patient advising that she woke up in a pol of blood this morning at around 10:00 AM. She feels like she experienced a low blood sugar during the night, which subsequently led to her rolling OOB and striking her head on her nightstand. Patient reports that she was unaware of the event until she woke up this morning in the pool of blood. Patient advises that she remembers getting OOB to go to the bathroom during the night. Upon waking, patient did not assess her FSBS. She advises that she drank a bottle of sweet tea and "felt better". Patient proceeded to the bathroom to assess her wounds and noted a large gaping laceration to the RIGHT side of her forehead. The decision was made to present for further evaluation. Patient states, "I have been trying since I woke up to get a taxi to bring me". Patient presents to urgent care with complaints of pain in her neck, RIGHT side of her face, and to the bridge of her nose. Patient is not on daily anticoagulation therapy. Patient with chronic tremors. She requires a cane for ambulation. Patient with very unsteady gait as observed when she walked into clinic. She presents CAO x 4 with no noted dysarthria. Patient noted noted to be in any acute distress upon arrival.   Past Medical History:  Diagnosis Date  . Acid reflux 06/16/2013  .  Adult hypothyroidism 06/16/2013   Last Assessment & Plan:  Tsh and energy have been stable.    . Benign hypertension 06/16/2013   Last Assessment & Plan:  Patients blood pressure has seemingly been controlled without significant side effects such as dizziness or slow heart rate.     . Cancer ([place]) skin  . Chronic kidney disease   . Depression   . Diabetes mellitus without complication ([place])   . GERD (gastroesophageal reflux disease)   . HBV (hepatitis B virus) infection   . High blood pressure   . Hives    food related  . Hypothyroidism   . Mixed incontinence   . Obstructive apnea 06/16/2013  . Sleep apnea   . Thyroid disease     Past Surgical History:  Procedure Laterality Date  . CATARACT EXTRACTION Bilateral   . ESOPHAGOGASTRODUODENOSCOPY (EGD) WITH PROPOFOL N/A 06/08/2015   Procedure: ESOPHAGOGASTRODUODENOSCOPY (EGD) WITH PROPOFOL;  Surgeon: Hulen Luster, MD;  Location: [facility] END[facility];  Service: Gastroenterology;  Laterality: N/A;  . HEMORRHOID SURGERY    . IMPLANTABLE CONTACT LENS IMPLANTATION    . TONSILLECTOMY    . TUBAL LIGATION      Family History  Problem Relation Age of Onset  . Heart disease Father   . Cancer Mother   . Kidney failure Maternal Aunt     Social History   Tobacco Use  . Smoking status: Former Smoker    Quit date: 07/27/1993    Years since quitting: 25.2  .  Smokeless tobacco: Never Used  . Tobacco comment: quit 20 years  Substance Use Topics  . Alcohol use: No    Alcohol/week: 0.0 standard drinks  . Drug use: No    Patient Active Problem List   Diagnosis Date Noted  . MGUS (monoclonal gammopathy of unknown significance) 07/08/2015  . Mixed incontinence 11/09/2014  . Atrophic vaginitis 07/28/2014  . Encounter for general adult medical examination without abnormal findings 05/28/2014  . Major depression in remission ([place]) 11/27/2013  . Morbid obesity ([place]) 11/16/2013  . Microalbuminuria 08/06/2013  . Breath shortness 08/02/2013  . Acid  reflux 06/16/2013  . Benign hypertension 06/16/2013  . Type 2 diabetes mellitus ([place]) 06/16/2013  . Adult hypothyroidism 06/16/2013  . Obstructive apnea 06/16/2013    Home Medications:    Current Meds  Medication Sig  . acetaminophen (TYLENOL) 500 MG tablet Take 500 mg by mouth every 6 (six) hours as needed for moderate pain.  Marland Kitchen azelastine (ASTELIN) 0.1 % nasal spray Place into the nose.  . Biotin w/ Vitamins C & E (HAIR/SKIN/NAILS PO) Take 1 tablet by mouth daily.  Marland Kitchen buPROPion (WELLBUTRIN XL) 150 MG 24 hr tablet Take 3 tablet po QD as directed  . Cholecalciferol (VITAMIN D3) 2000 UNITS capsule Take by mouth.  . ferrous sulfate 325 (65 FE) MG tablet Take 325 mg by mouth daily with breakfast.  . fluticasone (FLONASE) 50 MCG/ACT nasal spray Place into the nose.  . insulin NPH-regular Human (NOVOLIN 70/30) (70-30) 100 UNIT/ML injection Inject 16 units before breakfast and 26 units before supper as directed  . Insulin Syringe-Needle U-100 (INSULIN SYRINGE 1CC/31GX5/16") 31G X 5/16" 1 ML MISC USE ONE SYRINGE TWICE DAILY AS DIRECTED WITH INSULIN VIALS  . Iodoquinol-HC-Aloe Polysacch (ALCORTIN A) 1-2-1 % GEL Apply 1 application topically at bedtime.  Marland Kitchen levothyroxine (SYNTHROID, LEVOTHROID) 75 MCG tablet TAKE ONE TABLET BY MOUTH ONCE DAILY  . Multiple Vitamins-Minerals (HAIR SKIN AND NAILS FORMULA PO) Take by mouth.  . nystatin cream (MYCOSTATIN) Apply 1 application topically as needed.  . pantoprazole (PROTONIX) 40 MG tablet Take 40 mg by mouth daily.   Marland Kitchen PARoxetine (PAXIL) 40 MG tablet Take 40 mg by mouth every morning.   . pravastatin (PRAVACHOL) 80 MG tablet Take 80 mg by mouth daily.   Marland Kitchen torsemide (DEMADEX) 10 MG tablet Take 1 tablet by mouth daily.    Allergies:   Sulfa antibiotics, Amoxicillin-pot clavulanate, and Hydrocortisone-iodoquinol  [hydrocortisone-iodoquinol]  Review of Systems (ROS): Review of Systems  Constitutional: Negative for fatigue and fever.  HENT: Positive for  facial swelling (+) pain. Negative for congestion, ear pain, postnasal drip, rhinorrhea, sinus pressure, sinus pain, sneezing and sore throat.   Eyes: Positive for pain (RIGHT peri-orbital). Negative for photophobia, discharge, redness and visual disturbance.  Respiratory: Negative for cough, chest tightness and shortness of breath.   Cardiovascular: Negative for chest pain and palpitations.  Gastrointestinal: Negative for abdominal pain, diarrhea, nausea and vomiting.  Endocrine:       PMH (+) for diabetes  Musculoskeletal: Positive for back pain, gait problem (unsteady) and neck pain. Negative for arthralgias and myalgias.  Skin: Positive for wound. Negative for color change, pallor and rash.  Neurological: Positive for tremors. Negative for dizziness, syncope, weakness and headaches.  Hematological: Negative for adenopathy.  Psychiatric/Behavioral: The patient is nervous/anxious.      Vital Signs: Today's Vitals   10/27/18 1439 10/27/18 1440 10/27/18 1511  BP:  (!) 177/52   Pulse:  (!) 115   Resp:  19  Temp:  99.3 F (37.4 C)   TempSrc:  Oral   SpO2:  98%   Weight:  171 lb (77.6 kg)   Height:  5\' 1"  (1.549 m)   PainSc: 3   3     Physical Exam: Physical Exam  Constitutional: She is oriented to person, place, and time and well-developed, well-nourished, and in no distress.  HENT:  Head: Normocephalic. Head is with contusion and with laceration (4 cm linear (gaping) laceration to RIGHT side of forehead).  Right Ear: Tympanic membrane normal. No hemotympanum.  Left Ear: Tympanic membrane normal. No hemotympanum.  Mouth/Throat: Uvula is midline, oropharynx is clear and moist and mucous membranes are normal.  (+) RIGHT periorbital erythema, edema, and ecchymosis. No visual changes. Bridge of nose is TTP; abrasion noted. No facial/nasal crepitus. See attached medical photograph.   Eyes: Pupils are equal, round, and reactive to light. EOM are normal.  Neck: Normal range of motion.  Neck supple. Muscular tenderness present. No tracheal deviation present.  No midline pain or deformity.   Cardiovascular: Regular rhythm, normal heart sounds and intact distal pulses. Tachycardia present. Exam reveals no gallop and no friction rub.  No murmur heard. Pulmonary/Chest: Effort normal and breath sounds normal. No respiratory distress. She has no wheezes. She has no rales.  Abdominal: Soft. Bowel sounds are normal. She exhibits no distension. There is no abdominal tenderness.  Musculoskeletal: Normal range of motion.     Cervical back: She exhibits tenderness. She exhibits no swelling, no deformity and no spasm.     Thoracic back: She exhibits tenderness. She exhibits no swelling, no deformity and no spasm.       Back:  Neurological: She is alert and oriented to person, place, and time. She has normal sensation and normal reflexes. She displays weakness (generalized) and tremor. Gait (unsteady; uses cane) abnormal.  Skin: Skin is warm and dry. No rash noted.  Psychiatric: Memory, affect and judgment normal. Her mood appears anxious.  Nursing note and vitals reviewed.      Urgent Care Treatments / Results:   LABS: PLEASE NOTE: all labs that were ordered this encounter are listed, however only abnormal results are displayed. Labs Reviewed  GLUCOSE, CAPILLARY - Abnormal; Notable for the following components:      Result Value   Glucose-Capillary 371 (*)    All other components within normal limits  CBG MONITORING, ED    URGENT CARE ECG REPORT Date: 10/27/2018 Time ECG obtained: 1503  Rate: 112 bpm Rhythm: sinus tachycardia Axis (leads I and aVF): Normal Intervals: Normal ST segment and T wave changes: No evidence of ST segment elevation or depression Comparison: Similar to previous tracing obtained on 05/16/2011.  RADIOLOGY: No results found.  PROCEDURES: Procedures  MEDICATIONS RECEIVED THIS VISIT: Medications - No data to display  PERTINENT CLINICAL COURSE  NOTES/UPDATES: Clinical Course as of Oct 26 1633  Sat Oct 27, 2018  1505 Report called to [facility] ED charge nurse Rosana Hoes, RN). Patient to be arriving to their facility via EMS.    [BG]  1518 EMS departs from [facility] en route to [facility] ED. Patient CAO x 4 at the time of transfer.    [BG]    Clinical Course User Index [BG] Karen Kitchens, NP   Initial Impression / Assessment and Plan / Urgent Care Course:  Pertinent labs & imaging results that were available during my care of the patient were personally reviewed by me and considered in my medical decision making (see lab/imaging section  of note for values and interpretations).  Abigail Walker is a 73 y.o. female who presents to [facility] today with complaints of Fall   Patient is well appearing overall in clinic today. She does not appear to be in any acute distress. Presenting symptoms (see HPI) and exam as documented above. CBG 371 mg/dL. EKG shows sinus tachycardia with no ST segment elevation or depression. Patient with large laceration to her head. She complains of pain in her face and neck. Based on presenting exam and MOI, patient will require CT imaging of her head, face, and neck. Unfortunately, I do not have CT imaging available in the urgent care. Additionally, patient will require suture repair of her facial laceration and labs to assess reported hypoglycemia. Discuss need for transfer to a higher level of care in the emergency department. Patient verbalizes understanding and is amenable to transfer. Patient asking to take taxi to to the hospital, however given current clinical condition (unsteady gait, open wound, head/neck/facial pain), this is not in her best interest. Discussed EMS transport, and with firm encouragement, patient finally agreed to allow EMS to take her to [facility] for further evaluation and management.   Patient report called to [employer] charge nurse.  Advised of presenting complaints, assessment, and plans for transfer to the  emergency department for ongoing evaluation and management of her current care needs.  Question fielded.  Charge nurse advised to return call to [facility] urgent care staff with any questions or concerns related to the care that Alanson Aly received here today.  Charge nurse aware the patient will be presenting for care via EMS.  Final Clinical Impressions / Urgent Care Diagnoses:   Final diagnoses:  Fall, initial encounter  Contusion of face, initial encounter  Facial laceration, initial encounter  Hyperglycemia  Tachycardia    New Prescriptions:  [place] Controlled Substance Registry consulted? Not Applicable  No orders of the defined types were placed in this encounter.   Recommended Follow up Care:   Follow-up Information    Go to  [facility].   Specialty: Emergency Medicine Contact information: [place] [id number] [place] [phone]        NOTE: This note was prepared using Dragon dictation software along with smaller phrase technology. Despite my best ability to proofread, there is the potential that transcriptional errors may still occur from this process, and are completely unintentional.    Karen Kitchens, NP 10/27/18 [phone]

## 2018-10-27 NOTE — ED Notes (Signed)
Pt assisted to restroom.  

## 2018-10-27 NOTE — Discharge Instructions (Addendum)
You should have the sutures removed in 5 days.  Return to the ER for new, worsening, or persistent severe headache, recurrent episodes of low blood sugar or passing out, fever, weakness, or any other new or worsening symptoms that concern you.

## 2018-10-27 NOTE — ED Triage Notes (Addendum)
Patient arrived via taxi cab. Patient states that she was having a low with her blood sugar and apparently rolled out of bed and hit bedside table with her head. Patient states that when she is nervous she tends to wobble. Patient has been shaking throughout triage. Patient states that this happened around 10am.

## 2018-10-27 NOTE — ED Notes (Signed)
Patient transported to CT 

## 2019-01-09 ENCOUNTER — Other Ambulatory Visit: Payer: Self-pay

## 2019-01-09 ENCOUNTER — Inpatient Hospital Stay: Payer: Medicare Other | Attending: Hematology and Oncology

## 2019-01-09 ENCOUNTER — Telehealth: Payer: Self-pay | Admitting: Internal Medicine

## 2019-01-09 ENCOUNTER — Telehealth: Payer: Self-pay | Admitting: *Deleted

## 2019-01-09 ENCOUNTER — Telehealth: Payer: Self-pay | Admitting: Licensed Clinical Social Worker

## 2019-01-09 DIAGNOSIS — D472 Monoclonal gammopathy: Secondary | ICD-10-CM

## 2019-01-09 LAB — COMPREHENSIVE METABOLIC PANEL
ALT: 17 U/L (ref 0–44)
AST: 29 U/L (ref 15–41)
Albumin: 3.7 g/dL (ref 3.5–5.0)
Alkaline Phosphatase: 78 U/L (ref 38–126)
Anion gap: 9 (ref 5–15)
BUN: 14 mg/dL (ref 8–23)
CO2: 25 mmol/L (ref 22–32)
Calcium: 8.9 mg/dL (ref 8.9–10.3)
Chloride: 98 mmol/L (ref 98–111)
Creatinine, Ser: 1.57 mg/dL — ABNORMAL HIGH (ref 0.44–1.00)
GFR calc Af Amer: 37 mL/min — ABNORMAL LOW (ref >60)
GFR calc non Af Amer: 32 mL/min — ABNORMAL LOW (ref >60)
Glucose, Bld: 507 mg/dL (ref 70–99)
Potassium: 4 mmol/L (ref 3.5–5.1)
Sodium: 132 mmol/L — ABNORMAL LOW (ref 135–145)
Total Bilirubin: 0.3 mg/dL (ref 0.3–1.2)
Total Protein: 6.6 g/dL (ref 6.5–8.1)

## 2019-01-09 LAB — CBC WITH DIFFERENTIAL/PLATELET
Abs Immature Granulocytes: 0 10*3/uL (ref 0.00–0.07)
Basophils Absolute: 0 10*3/uL (ref 0.0–0.1)
Basophils Relative: 1 %
Eosinophils Absolute: 0.1 10*3/uL (ref 0.0–0.5)
Eosinophils Relative: 3 %
HCT: 37.7 % (ref 36.0–46.0)
Hemoglobin: 12.5 g/dL (ref 12.0–15.0)
Immature Granulocytes: 0 %
Lymphocytes Relative: 48 %
Lymphs Abs: 1.7 10*3/uL (ref 0.7–4.0)
MCH: 29.6 pg (ref 26.0–34.0)
MCHC: 33.2 g/dL (ref 30.0–36.0)
MCV: 89.3 fL (ref 80.0–100.0)
Monocytes Absolute: 0.4 10*3/uL (ref 0.1–1.0)
Monocytes Relative: 10 %
Neutro Abs: 1.4 10*3/uL — ABNORMAL LOW (ref 1.7–7.7)
Neutrophils Relative %: 38 %
Platelets: 160 10*3/uL (ref 150–400)
RBC: 4.22 MIL/uL (ref 3.87–5.11)
RDW: 12.6 % (ref 11.5–15.5)
WBC: 3.6 10*3/uL — ABNORMAL LOW (ref 4.0–10.5)
nRBC: 0 % (ref 0.0–0.2)

## 2019-01-09 NOTE — Telephone Encounter (Signed)
2nd msg left for pt by Tamika, CMA re: urgency of glucose levels.

## 2019-01-09 NOTE — Telephone Encounter (Signed)
Left message taht her blood glucose is high. Need to reach out to PCP or go to ER tonite.   T- please call pt later in the afternoon re: my above recommendations.

## 2019-01-09 NOTE — Telephone Encounter (Signed)
Lab called to say that patient's glucose as 507.  Clinic team made aware.

## 2019-01-09 NOTE — Telephone Encounter (Signed)
Critical results called from Lab about patient's blood sugar over 500. Dr. Rogue Bussing left a message for patient to follow up with PCP and or go to the ED tonight. I also called the patient 30 minutes later to see if I could reach her with no luck. I also left a message.

## 2019-01-10 LAB — KAPPA/LAMBDA LIGHT CHAINS
Kappa free light chain: 29 mg/L — ABNORMAL HIGH (ref 3.3–19.4)
Kappa, lambda light chain ratio: 1.66 — ABNORMAL HIGH (ref 0.26–1.65)
Lambda free light chains: 17.5 mg/L (ref 5.7–26.3)

## 2019-01-14 LAB — MULTIPLE MYELOMA PANEL, SERUM
Albumin SerPl Elph-Mcnc: 3.5 g/dL (ref 2.9–4.4)
Albumin/Glob SerPl: 1.5 (ref 0.7–1.7)
Alpha 1: 0.2 g/dL (ref 0.0–0.4)
Alpha2 Glob SerPl Elph-Mcnc: 0.8 g/dL (ref 0.4–1.0)
B-Globulin SerPl Elph-Mcnc: 0.8 g/dL (ref 0.7–1.3)
Gamma Glob SerPl Elph-Mcnc: 0.7 g/dL (ref 0.4–1.8)
Globulin, Total: 2.4 g/dL (ref 2.2–3.9)
IgA: 114 mg/dL (ref 64–422)
IgG (Immunoglobin G), Serum: 778 mg/dL (ref 586–1602)
IgM (Immunoglobulin M), Srm: 51 mg/dL (ref 26–217)
M Protein SerPl Elph-Mcnc: 0.2 g/dL — ABNORMAL HIGH
Total Protein ELP: 5.9 g/dL — ABNORMAL LOW (ref 6.0–8.5)

## 2019-01-15 NOTE — Progress Notes (Signed)
This encounter was created in error - please disregard.

## 2019-01-16 DIAGNOSIS — N2581 Secondary hyperparathyroidism of renal origin: Secondary | ICD-10-CM | POA: Insufficient documentation

## 2019-01-16 DIAGNOSIS — I129 Hypertensive chronic kidney disease with stage 1 through stage 4 chronic kidney disease, or unspecified chronic kidney disease: Secondary | ICD-10-CM | POA: Insufficient documentation

## 2019-01-16 DIAGNOSIS — E871 Hypo-osmolality and hyponatremia: Secondary | ICD-10-CM | POA: Insufficient documentation

## 2019-01-17 ENCOUNTER — Inpatient Hospital Stay: Payer: Medicare Other | Admitting: Hematology and Oncology

## 2019-01-17 DIAGNOSIS — Z7189 Other specified counseling: Secondary | ICD-10-CM

## 2019-01-17 DIAGNOSIS — F325 Major depressive disorder, single episode, in full remission: Secondary | ICD-10-CM

## 2019-01-17 DIAGNOSIS — D472 Monoclonal gammopathy: Secondary | ICD-10-CM

## 2019-01-17 DIAGNOSIS — N183 Chronic kidney disease, stage 3 unspecified: Secondary | ICD-10-CM

## 2019-01-17 DIAGNOSIS — D72819 Decreased white blood cell count, unspecified: Secondary | ICD-10-CM | POA: Insufficient documentation

## 2019-04-08 ENCOUNTER — Other Ambulatory Visit: Payer: Self-pay

## 2019-04-08 DIAGNOSIS — N3946 Mixed incontinence: Secondary | ICD-10-CM

## 2019-04-09 ENCOUNTER — Other Ambulatory Visit: Payer: Self-pay

## 2019-04-09 ENCOUNTER — Ambulatory Visit (INDEPENDENT_AMBULATORY_CARE_PROVIDER_SITE_OTHER): Payer: Medicare Other | Admitting: Urology

## 2019-04-09 ENCOUNTER — Encounter: Payer: Self-pay | Admitting: Urology

## 2019-04-09 VITALS — BP 113/56 | HR 106 | Ht 61.1 in | Wt 157.0 lb

## 2019-04-09 DIAGNOSIS — R39198 Other difficulties with micturition: Secondary | ICD-10-CM

## 2019-04-09 DIAGNOSIS — L439 Lichen planus, unspecified: Secondary | ICD-10-CM | POA: Diagnosis not present

## 2019-04-09 LAB — BLADDER SCAN AMB NON-IMAGING

## 2019-04-09 NOTE — Progress Notes (Signed)
04/09/19 2:48 PM   Abigail Walker [id number]  CC: Lichen Planus, incontinence  HPI: I saw [name] in urology clinic today for lichen planus of the vagina as well as incontinence.  She is a very comorbid 74 year old female with CKD, depression, poorly controlled diabetes with recent hemoglobin A1c of [id number], and chronic lichen planus of the vagina.  She really has no new complaints today.  She previously was having some urge incontinence, but reports that this is improved over the last few weeks with a new medication for diabetes.  She denies any gross hematuria.  On her last urinalysis in October 2020 there was no microscopic hematuria.  She has almost complete fusion of her labia secondary to lichen planus, and reports a spraying stream when she urinates.  She previously was using estrogen cream, but felt this only minimally improved her symptoms.  She continues to use clobetasol which she feels helps the most with the discomfort and itching.  She reports some occasional urinary incontinence when she is seated and she does not have urge incontinence or really stress incontinence.  She previously had PTNS with Zara Council, PA in 2017 with only mild improvement in her urinary symptoms.  She was unable to void for urinalysis today, and PVR was 0 mL.  PMH: Past Medical History:  Diagnosis Date  . Acid reflux 06/16/2013  . Adult hypothyroidism 06/16/2013   Last Assessment & Plan:  Tsh and energy have been stable.    . Benign hypertension 06/16/2013   Last Assessment & Plan:  Patients blood pressure has seemingly been controlled without significant side effects such as dizziness or slow heart rate.     . Cancer ([place]) skin  . Chronic kidney disease   . Depression   . Diabetes mellitus without complication ([place])   . GERD (gastroesophageal reflux disease)   . HBV (hepatitis B virus) infection   . High blood pressure   . Hives    food related  . Hypothyroidism   . Mixed  incontinence   . Obstructive apnea 06/16/2013  . Sleep apnea   . Thyroid disease     Surgical History: Past Surgical History:  Procedure Laterality Date  . CATARACT EXTRACTION Bilateral   . ESOPHAGOGASTRODUODENOSCOPY (EGD) WITH PROPOFOL N/A 06/08/2015   Procedure: ESOPHAGOGASTRODUODENOSCOPY (EGD) WITH PROPOFOL;  Surgeon: Hulen Luster, MD;  Location: [facility] ENDOSCOPY;  Service: Gastroenterology;  Laterality: N/A;  . HEMORRHOID SURGERY    . IMPLANTABLE CONTACT LENS IMPLANTATION    . TONSILLECTOMY    . TUBAL LIGATION      Family History: Family History  Problem Relation Age of Onset  . Heart disease Father   . Cancer Mother   . Kidney failure Maternal Aunt     Social History:  reports that she quit smoking about 25 years ago. She has never used smokeless tobacco. She reports that she does not drink alcohol or use drugs.  Physical Exam: BP (!) 113/56 (BP Location: Left Arm, Patient Position: Sitting, Cuff Size: Large)   Pulse (!) 106   Ht 5' 1.1" (1.552 m)   Wt 157 lb (71.2 kg)   BMI 29.57 kg/m    GU: Pelvic exam with almost complete fusion of the labia, unable to visualize urethral meatus, significant irritation consistent with lichen planus.  Charlott Rakes was chaperone.  Assessment & Plan:   In summary, the patient is a very co-morbid 74 year old female with poorly controlled diabetes and chronic lichen planus of the vagina with vaginal  atrophy and fusion of the labia.  Her urinary symptoms of a spraying stream secondary to lichen planus.  She does not have any gross hematuria or microscopic hematuria that warrants further work-up.  I stressed at length with her the importance of improved diabetic control in terms of both her urinary symptoms and kidney function.  She is not interested in any medications for her urinary symptoms  Continue clobetasol cream topically Follow-up with urology as needed  I spent 45 total minutes on the day of the encounter including pre-visit review of the  medical record, face-to-face time with the patient, and post visit ordering of labs/imaging/tests.  Nickolas Madrid, MD 04/09/2019  [facility] [address] [place], [place] [address] [phone]

## 2019-05-28 ENCOUNTER — Other Ambulatory Visit: Payer: Self-pay | Admitting: Family Medicine

## 2019-05-28 DIAGNOSIS — M545 Low back pain, unspecified: Secondary | ICD-10-CM

## 2019-05-28 DIAGNOSIS — G8929 Other chronic pain: Secondary | ICD-10-CM

## 2019-05-28 DIAGNOSIS — M21372 Foot drop, left foot: Secondary | ICD-10-CM

## 2019-05-28 DIAGNOSIS — R296 Repeated falls: Secondary | ICD-10-CM

## 2019-06-08 ENCOUNTER — Other Ambulatory Visit: Payer: Self-pay

## 2019-06-08 ENCOUNTER — Ambulatory Visit
Admission: RE | Admit: 2019-06-08 | Discharge: 2019-06-08 | Disposition: A | Discharge status: Home / Self Care | Payer: Medicare Other | Source: Ambulatory Visit | Attending: Family Medicine | Admitting: Family Medicine

## 2019-06-08 DIAGNOSIS — M21372 Foot drop, left foot: Secondary | ICD-10-CM

## 2019-06-08 DIAGNOSIS — G8929 Other chronic pain: Secondary | ICD-10-CM

## 2019-06-08 DIAGNOSIS — M545 Low back pain: Secondary | ICD-10-CM | POA: Insufficient documentation

## 2019-06-08 DIAGNOSIS — R296 Repeated falls: Secondary | ICD-10-CM

## 2019-07-11 ENCOUNTER — Other Ambulatory Visit: Payer: Self-pay | Admitting: Family Medicine

## 2019-07-11 DIAGNOSIS — I779 Disorder of arteries and arterioles, unspecified: Secondary | ICD-10-CM

## 2019-08-12 ENCOUNTER — Ambulatory Visit (INDEPENDENT_AMBULATORY_CARE_PROVIDER_SITE_OTHER): Payer: Medicare Other | Admitting: Vascular Surgery

## 2019-08-12 ENCOUNTER — Encounter (INDEPENDENT_AMBULATORY_CARE_PROVIDER_SITE_OTHER): Payer: Self-pay | Admitting: Vascular Surgery

## 2019-08-12 ENCOUNTER — Other Ambulatory Visit: Payer: Self-pay

## 2019-08-12 VITALS — BP 136/72 | HR 101 | Resp 14 | Ht 61.5 in | Wt 136.0 lb

## 2019-08-12 DIAGNOSIS — I6523 Occlusion and stenosis of bilateral carotid arteries: Secondary | ICD-10-CM | POA: Diagnosis not present

## 2019-08-12 DIAGNOSIS — I10 Essential (primary) hypertension: Secondary | ICD-10-CM | POA: Diagnosis not present

## 2019-08-12 DIAGNOSIS — E782 Mixed hyperlipidemia: Secondary | ICD-10-CM

## 2019-08-12 DIAGNOSIS — E1151 Type 2 diabetes mellitus with diabetic peripheral angiopathy without gangrene: Secondary | ICD-10-CM | POA: Diagnosis not present

## 2019-08-12 NOTE — Progress Notes (Signed)
MRN : [id number]  Abigail Walker is a 74 y.o. (31-Mar-1945) female who presents with chief complaint of No chief complaint on file. Marland Kitchen  History of Present Illness:   The patient is seen for evaluation of carotid stenosis. The carotid stenosis was identified after duplex ultrasound was obtained for a bruit.  The patient denies amaurosis fugax. There is no recent history of TIA symptoms or focal motor deficits. There is no prior documented CVA.  There is no history of migraine headaches. There is no history of seizures.  The patient is taking enteric-coated aspirin 81 mg daily.  The patient has a history of coronary artery disease, no recent episodes of angina or shortness of breath. The patient denies PAD or claudication symptoms. There is a history of hyperlipidemia which is being treated with a statin.   Duplex ultrasound 07/09/2019 is reviewed by me and shows RICA 20-25% and LICA 42-70%  Current Meds  Medication Sig  . acetaminophen (TYLENOL) 500 MG tablet Take 500 mg by mouth every 6 (six) hours as needed for moderate pain.  Marland Kitchen buPROPion (WELLBUTRIN XL) 150 MG 24 hr tablet Take 3 tablet po QD as directed  . clobetasol ointment (TEMOVATE) 0.05 % Apply     as directed QD/BID to AA's groin 5 days a week. Avoid face, axilla.  . ferrous sulfate 325 (65 FE) MG tablet Take 325 mg by mouth daily with breakfast.  . hydrocortisone cream 1 % Apply 1 application topically as needed for itching.  . insulin NPH-regular Human (NOVOLIN 70/30) (70-30) 100 UNIT/ML injection Inject 16 units before breakfast and 26 units before supper as directed  . insulin NPH-regular Human (NOVOLIN 70/30) (70-30) 100 UNIT/ML injection INJECT 30 UNITS SUBCUTANEOUSLY TWICE DAILY BEFORE A MEAL  . Insulin Syringe-Needle U-100 (INSULIN SYRINGE 1CC/31GX5/16") 31G X 5/16" 1 ML MISC USE ONE SYRINGE TWICE DAILY AS DIRECTED WITH INSULIN VIALS  . Iodoquinol-HC-Aloe Polysacch (ALCORTIN A) 1-2-1 % GEL Apply 1 application  topically at bedtime.  Marland Kitchen levothyroxine (SYNTHROID, LEVOTHROID) 75 MCG tablet TAKE ONE TABLET BY MOUTH ONCE DAILY  . lisinopril (ZESTRIL) 5 MG tablet   . neomycin-bacitracin-polymyxin (NEOSPORIN) ointment Apply 1 application topically as needed for wound care.  . nystatin cream (MYCOSTATIN) Apply 1 application topically as needed.  Marland Kitchen PARoxetine (PAXIL) 40 MG tablet Take 40 mg by mouth every morning.   . pravastatin (PRAVACHOL) 80 MG tablet Take 80 mg by mouth daily.   . TEA TREE OIL EX Apply topically as needed.  . torsemide (DEMADEX) 10 MG tablet   . UNABLE TO FIND as needed. [facility] antibiotic and pain relief cream    Past Medical History:  Diagnosis Date  . Acid reflux 06/16/2013  . Adult hypothyroidism 06/16/2013   Last Assessment & Plan:  Tsh and energy have been stable.    . Benign hypertension 06/16/2013   Last Assessment & Plan:  Patients blood pressure has seemingly been controlled without significant side effects such as dizziness or slow heart rate.     . Cancer ([place]) skin  . Sherran Needs syndrome   . Chronic kidney disease   . Depression   . Diabetes mellitus without complication ([place])   . GERD (gastroesophageal reflux disease)   . HBV (hepatitis B virus) infection   . High blood pressure   . Hives    food related  . Hypothyroidism   . Lichen planus   . Mixed incontinence   . Obstructive apnea 06/16/2013  . Sleep apnea   .  Thyroid disease     Past Surgical History:  Procedure Laterality Date  . CATARACT EXTRACTION Bilateral   . ESOPHAGOGASTRODUODENOSCOPY (EGD) WITH PROPOFOL N/A 06/08/2015   Procedure: ESOPHAGOGASTRODUODENOSCOPY (EGD) WITH PROPOFOL;  Surgeon: Hulen Luster, MD;  Location: [facility] ENDOSCOPY;  Service: Gastroenterology;  Laterality: N/A;  . HEMORRHOID SURGERY    . IMPLANTABLE CONTACT LENS IMPLANTATION    . TONSILLECTOMY    . TUBAL LIGATION      Social History Social History   Tobacco Use  . Smoking status: Former Smoker    Quit date: 07/27/1993    Years  since quitting: 26.0  . Smokeless tobacco: Never Used  . Tobacco comment: quit 20 years  Vaping Use  . Vaping Use: Never used  Substance Use Topics  . Alcohol use: No    Alcohol/week: 0.0 standard drinks  . Drug use: No    Family History Family History  Problem Relation Age of Onset  . Heart disease Father   . Cancer Mother   . Kidney failure Maternal Aunt   No family history of bleeding/clotting disorders, porphyria or autoimmune disease   Allergies  Allergen Reactions  . Other     Other reaction(s): Other (See Comments), Other (See Comments) CLINDYAMYCIN = UNKNOWN CLINDYAMYCIN = UNKNOWN   . Sulfa Antibiotics Nausea And Vomiting  . Amoxicillin-Pot Clavulanate Rash  . Hydrocortisone-Iodoquinol  [Hydrocortisone-Iodoquinol] Rash     REVIEW OF SYSTEMS (Negative unless checked)  Constitutional: [] Weight loss  [] Fever  [] Chills Cardiac: [] Chest pain   [] Chest pressure   [] Palpitations   [] Shortness of breath when laying flat   [] Shortness of breath with exertion. Vascular:  [] Pain in legs with walking   [] Pain in legs at rest  [] History of DVT   [] Phlebitis   [] Swelling in legs   [] Varicose veins   [] Non-healing ulcers Pulmonary:   [] Uses home oxygen   [] Productive cough   [] Hemoptysis   [] Wheeze  [] COPD   [] Asthma Neurologic:  [] Dizziness   [] Seizures   [] History of stroke   [] History of TIA  [] Aphasia   [] Vissual changes   [] Weakness or numbness in arm   [] Weakness or numbness in leg Musculoskeletal:   [] Joint swelling   [x] Joint pain   [] Low back pain Hematologic:  [] Easy bruising  [] Easy bleeding   [] Hypercoagulable state   [] Anemic Gastrointestinal:  [] Diarrhea   [] Vomiting  [] Gastroesophageal reflux/heartburn   [] Difficulty swallowing. Genitourinary:  [] Chronic kidney disease   [] Difficult urination  [] Frequent urination   [] Blood in urine Skin:  [] Rashes   [] Ulcers  Psychological:  [] History of anxiety   []  History of major depression.  Physical Examination  Vitals:    08/12/19 1327  BP: (!) 136/72  Pulse: 101  Resp: 14  Weight: 136 lb (61.7 kg)  Height: 5' 1.5" (1.562 m)   Body mass index is 25.28 kg/m. Gen: WD/WN, NAD Head: [place]/AT, No temporalis wasting.  Ear/Nose/Throat: Hearing grossly intact, nares w/o erythema or drainage, poor dentition Eyes: PER, EOMI, sclera nonicteric.  Neck: Supple, no masses.  No bruit or JVD.  Pulmonary:  Good air movement, clear to auscultation bilaterally, no use of accessory muscles.  Cardiac: RRR, normal S1, S2, no Murmurs. Vascular: bilateral carotid bruit Vessel Right Left  Radial Palpable Palpable  Brachial Palpable Palpable  Carotid Palpable Palpable  Gastrointestinal: soft, non-distended. No guarding/no peritoneal signs.  Musculoskeletal: M/S 5/5 throughout.  No deformity or atrophy.  Neurologic: CN 2-12 intact. Pain and light touch intact in extremities.  Symmetrical.  Speech is  fluent. Motor exam as listed above. Psychiatric: Judgment intact, Mood & affect appropriate for pt's clinical situation. Dermatologic: No rashes or ulcers noted.  No changes consistent with cellulitis.  CBC Lab Results  Component Value Date   WBC 3.6 (L) 01/09/2019   HGB 12.5 01/09/2019   HCT 37.7 01/09/2019   MCV 89.3 01/09/2019   PLT 160 01/09/2019    BMET    Component Value Date/Time   NA 132 (L) 01/09/2019 1432   NA 140 05/19/2011 0409   K 4.0 01/09/2019 1432   K 4.2 05/19/2011 0409   CL 98 01/09/2019 1432   CL 105 05/19/2011 0409   CO2 25 01/09/2019 1432   CO2 28 05/19/2011 0409   GLUCOSE 507 (HH) 01/09/2019 1432   GLUCOSE 158 (H) 05/19/2011 0409   BUN 14 01/09/2019 1432   BUN 15 05/19/2011 0409   CREATININE 1.57 (H) 01/09/2019 1432   CREATININE 1.18 05/19/2011 0409   CALCIUM 8.9 01/09/2019 1432   CALCIUM 9.2 05/19/2011 0409   GFRNONAA 32 (L) 01/09/2019 1432   GFRNONAA 48 (L) 05/19/2011 0409   GFRAA 37 (L) 01/09/2019 1432   GFRAA 56 (L) 05/19/2011 0409   CrCl cannot be calculated (Patient's most  recent lab result is older than the maximum 21 days allowed.).  COAG Lab Results  Component Value Date   INR 0.98 08/14/2014    Radiology No results found.    Assessment/Plan 1. Bilateral carotid artery stenosis Recommend:  Given the patient's asymptomatic subcritical stenosis no further invasive testing or surgery at this time.  Duplex ultrasound 07/09/2019 is reviewed by me and shows RICA 32-35% and LICA 57-32%  Continue antiplatelet therapy as prescribed Continue management of CAD, HTN and Hyperlipidemia Healthy heart diet,  encouraged exercise at least 4 times per week.  Follow up in 6 months with duplex ultrasound and physical exam   - VAS US CAROTID; Future  2. Benign hypertension Continue antihypertensive medications as already ordered, these medications have been reviewed and there are no changes at this time.   3. Type 2 diabetes mellitus with diabetic peripheral angiopathy without gangrene, unspecified whether long term insulin use ([place]) Continue hypoglycemic medications as already ordered, these medications have been reviewed and there are no changes at this time.  Hgb A1C to be monitored as already arranged by primary service   4. Mixed hyperlipidemia Continue statin as ordered and reviewed, no changes at this time    Hortencia Pilar, MD  08/12/2019 1:30 PM

## 2019-08-13 ENCOUNTER — Encounter (INDEPENDENT_AMBULATORY_CARE_PROVIDER_SITE_OTHER): Payer: Self-pay | Admitting: Vascular Surgery

## 2019-08-13 DIAGNOSIS — E785 Hyperlipidemia, unspecified: Secondary | ICD-10-CM | POA: Insufficient documentation

## 2019-09-14 DIAGNOSIS — R29898 Other symptoms and signs involving the musculoskeletal system: Secondary | ICD-10-CM | POA: Insufficient documentation

## 2019-09-18 ENCOUNTER — Ambulatory Visit (INDEPENDENT_AMBULATORY_CARE_PROVIDER_SITE_OTHER): Payer: Medicare Other | Admitting: Dermatology

## 2019-09-18 ENCOUNTER — Other Ambulatory Visit: Payer: Self-pay

## 2019-09-18 DIAGNOSIS — L439 Lichen planus, unspecified: Secondary | ICD-10-CM | POA: Diagnosis not present

## 2019-09-18 DIAGNOSIS — L820 Inflamed seborrheic keratosis: Secondary | ICD-10-CM | POA: Diagnosis not present

## 2019-09-18 DIAGNOSIS — I6523 Occlusion and stenosis of bilateral carotid arteries: Secondary | ICD-10-CM | POA: Diagnosis not present

## 2019-09-18 NOTE — Progress Notes (Signed)
Follow-Up Visit   Subjective  Abigail Walker is a 74 y.o. female who presents for the following: lichen planus/LS et A (vaginal and perianal still flaring.  Patient uses the Clobetasol very sparingly since it is so expensive). She has been so severe with inflammation and scarring, that she has had urethral strictures requiring urology evaluation and treatment.  She uses clobetasol which she feels helps, but she often does not get it filled due to the expense.  We have appealed to her insurance company multiple times but they deny her clobetasol or it is so expensive it is hard for her to afford.  She does not have a GYN. She has seen her urologist.  Note was reviewed today. Patient has an itchy irritated lesion on the left shoulder that she would like checked today.   The following portions of the chart were reviewed this encounter and updated as appropriate:  Tobacco  Allergies  Meds  Problems  Med Hx  Surg Hx  Fam Hx     Review of Systems:  No other skin or systemic complaints except as noted in HPI or Assessment and Plan.  Objective  Well appearing patient in no apparent distress; mood and affect are within normal limits.  A focused examination was performed including vaginal/perianal. Relevant physical exam findings are noted in the Assessment and Plan.  Objective  vaginal and perianal: Atrophy, erythema of the groin and pubic area. Atrophy of labia majora and minora . Bright red beefy erythema of introitus. Milder involvement of the perianal/buttocks area.  Images      Objective  L shoulder x 1: Erythematous keratotic or waxy stuck-on papule or plaque.   Assessment & Plan  Lichen planus vaginal and perianal With possible LS et A -   Reviewed notes from Dr. Diamantina Providence (urologist): "In summary, the patient is a very co-morbid 74 year old female with poorly controlled diabetes and chronic lichen planus of the vagina with vaginal atrophy and fusion of the labia.  Her  urinary symptoms of a spraying stream secondary to lichen planus.  She does not have any gross hematuria or microscopic hematuria that warrants further work-up.  I stressed at length with her the importance of improved diabetic control in terms of both her urinary symptoms and kidney function.  She is not interested in any medications for her urinary symptoms. Continue clobetasol cream topically. Follow-up with urology as needed"  I recommend the patient continue Clobetasol cream QD to aa's, but this is very expensive for her and she would like to know what other options are available. Recommend consult with Dr. Nicole Kindred and myself in one month to collaborate and discuss other possible treatment options with patient.  Consider GYN referral at that time, and patient should follow up with Urology PRN.  Inflamed seborrheic keratosis L shoulder x 1  Destruction of lesion - L shoulder x 1 Complexity: simple   Destruction method: cryotherapy   Informed consent: discussed and consent obtained   Timeout:  patient name, date of birth, surgical site, and procedure verified Lesion destroyed using liquid nitrogen: Yes   Region frozen until ice ball extended beyond lesion: Yes   Outcome: patient tolerated procedure well with no complications   Post-procedure details: wound care instructions given    Return in about 1 month (around 10/18/2019) for follow up appointment with DR. Nicole Kindred on a Monday or Tuesday. Dr. Nehemiah Massed will see patient too.Luther Redo, CMA, am acting as scribe for Sarina Ser, MD .  Documentation: I have reviewed the above documentation for accuracy and completeness, and I agree with the above.  Sarina Ser, MD

## 2019-09-23 ENCOUNTER — Encounter: Payer: Self-pay | Admitting: Dermatology

## 2019-10-15 ENCOUNTER — Other Ambulatory Visit: Payer: Self-pay

## 2019-10-15 ENCOUNTER — Ambulatory Visit (INDEPENDENT_AMBULATORY_CARE_PROVIDER_SITE_OTHER): Payer: Medicare Other | Admitting: Dermatology

## 2019-10-15 DIAGNOSIS — L439 Lichen planus, unspecified: Secondary | ICD-10-CM | POA: Diagnosis not present

## 2019-10-15 DIAGNOSIS — L906 Striae atrophicae: Secondary | ICD-10-CM | POA: Diagnosis not present

## 2019-10-15 DIAGNOSIS — I6523 Occlusion and stenosis of bilateral carotid arteries: Secondary | ICD-10-CM | POA: Diagnosis not present

## 2019-10-15 DIAGNOSIS — L249 Irritant contact dermatitis, unspecified cause: Secondary | ICD-10-CM

## 2019-10-15 NOTE — Progress Notes (Signed)
   Follow-Up Visit   Subjective  Abigail Walker is a 74 y.o. female who presents for the following: Lichen Planus with possible LS et A (vaginal and perianal).  Patient states that she is about the same as her last visit, raw, painful, and itchy. She is only using Clobetasol ointment every 3-4 days as it is too expensive. On the other days, she is using Nystatin Cream and in the past has used Neosporin. She has had this condition since the early 90s.   The following portions of the chart were reviewed this encounter and updated as appropriate:      Review of Systems:  No other skin or systemic complaints except as noted in HPI or Assessment and Plan.  Objective  Well appearing patient in no apparent distress; mood and affect are within normal limits.  A focused examination was performed including groin. Relevant physical exam findings are noted in the Assessment and Plan.  Objective  Vaginal area: Erythema of the vulva; loss of labia minora with agglutination and scarring with reduced size of vaginal opening; white lacy discoloration of the right superior vaginal opening; scarring of the inferior vaginal opening; clitoral hood is scarred down;  Objective  Perivulvar creases: Diffuse erythema of the suprapubic and perivulvar creases.  Objective  Superpubic, lower abdomen: Atrophic white linear patches of the superpubic and lower abdomen.  Pt states from prior pregnancies   Assessment & Plan  Lichen planus Vaginal area  Lichen Planus/LS et A- severe with extensive scarring  Increase Clobetasol Ointment to vaginal opening and labia BID.  She has been using it only q3 days due to rationing because of expense. Rx sent to [facility].  240 gm for $65 plus shipping  Will refer to Dr Benjaman Kindler at [facility] for evaluation since patient has increased risk SCC.  She is hesitant to see gyn because of the pain associated with the internal exam.  I advised to to let the  doctor know about that issue so they can minimize the discomfort.  Lichen sclerosus is a chronic inflammatory condition that is NOT sexually transmitted. It requires regular monitoring and treatment to minimize inflammation to reduce risk of scarring. There is also a risk of cancer in the area which is very low if inflammation is well controlled. Regular checks of the area are recommended. Please call if you notice any new or changing spots within this area.     Other Related Procedures Ambulatory referral to Gynecology  Irritant dermatitis Perivulvar creases  Secondary to incontinence.  Patient wears pad at night that gets soaked by morning  Continue Nystatin Cream BID to body folds groin Avoid Neosporin due to potential for allergic dermatitis  Recommend patient use Vaseline to irritated areas and skin creases as a skin protectant every night before bed to reduce irritation.  Striae Superpubic, lower abdomen  Due to pregnancies in the past, doubt steroid atrophy Benign, observe.  Return in about 4 weeks (around 11/12/2019) for LP/LS&A.   IJamesetta Orleans, CMA, am acting as scribe for Brendolyn Patty, MD .  Documentation: I have reviewed the above documentation for accuracy and completeness, and I agree with the above.  Brendolyn Patty MD

## 2019-10-15 NOTE — Patient Instructions (Addendum)
Instructions for [facility] Medications  One or more of your medications was sent to the [facility] mail order compounding pharmacy. You will receive an email from them and can purchase the medicine through that link. It will then be mailed to your home at the address you confirmed. If for any reason you do not receive an email from them, please check your spam folder. If you still do not find the email, please let us know. [facility] phone number is [phone].  Clobetasol Ointment - Apply to vaginal opening and lips twice a day.  Apply Nystatin Cream to skin creases twice a day for redness.  Apply Vaseline to skin creases before bed.

## 2019-11-19 ENCOUNTER — Ambulatory Visit (INDEPENDENT_AMBULATORY_CARE_PROVIDER_SITE_OTHER): Payer: Medicare Other | Admitting: Dermatology

## 2019-11-19 ENCOUNTER — Other Ambulatory Visit: Payer: Self-pay

## 2019-11-19 DIAGNOSIS — R32 Unspecified urinary incontinence: Secondary | ICD-10-CM | POA: Diagnosis not present

## 2019-11-19 DIAGNOSIS — L249 Irritant contact dermatitis, unspecified cause: Secondary | ICD-10-CM

## 2019-11-19 DIAGNOSIS — I6523 Occlusion and stenosis of bilateral carotid arteries: Secondary | ICD-10-CM | POA: Diagnosis not present

## 2019-11-19 DIAGNOSIS — L439 Lichen planus, unspecified: Secondary | ICD-10-CM

## 2019-11-19 MED ORDER — FLUCONAZOLE 200 MG PO TABS: 200.0000 mg | ORAL_TABLET | Freq: Every day | ORAL | 1 refills | Status: AC

## 2019-11-19 MED ORDER — CLOBETASOL PROPIONATE 0.05 % EX OINT: TOPICAL_OINTMENT | CUTANEOUS | 1 refills | Status: DC

## 2019-11-19 NOTE — Patient Instructions (Addendum)
Fluconazole 200mg  take 1 pill every day for 1 week, then decrease and take 1 pill every week until gone. Avoid taking Pravastatin on the same day that you take fluconazole.  Clobetasol Ointment - Continue to affected areas vaginal area twice a day. Avoid using in creases and body folds. Prescription sent to [facility]. They will mail prescription to you.  Apply Vaseline around vaginal area at night before bed to protect skin from moisture. Apply Nystatin Cream to creases every morning as needed.

## 2019-11-19 NOTE — Progress Notes (Signed)
   Follow-Up Visit   Subjective  Abigail Walker is a 74 y.o. female who presents for the following: Lichen Planus/LS et A - severe. She is improved some, but still has a spot in the vaginal area that is painful during urination. She is using Clobetasol Ointment twice a day. She was not able to get anyone from [facility] to call her back for a new Clobetasol prescription.  She has referral with Dr Benjaman Kindler scheduled for December.   The following portions of the chart were reviewed this encounter and updated as appropriate:      Review of Systems:  No other skin or systemic complaints except as noted in HPI or Assessment and Plan.  Objective  Well appearing patient in no apparent distress; mood and affect are within normal limits.  A focused examination was performed including groin. Relevant physical exam findings are noted in the Assessment and Plan.  Objective  Vaginal area: Labia minora with severe scarring with loss of normal anatomy.  Right labia minora ulcerated; erythema improving with less ulceration on left labia minora.  Oral mucosa is clear  Objective  Perivulvar creases: Mild erythema of the BL perivulvar folds. Improved from previous exam   Assessment & Plan  Lichen planus Vaginal area  Lichen Planus/LS et A - Chronic, Severe with scarring  Continue Clobetasol Ointment apply BID to Aas, avoid creases dsp 60g Will treat for possible secondary Candida Start Diflucan 200mg  take 1 po QD x 7 days, then take 1 po QWK dsp #30 1Rf. Discussed risk liver inflammation. Avoid taking Pravastatin on the same day as Diflucan.  Patient has appointment scheduled with Dr Benjaman Kindler at the end of December.  Lichen Planus/Lichen sclerosus is a chronic inflammatory condition that is NOT sexually transmitted. It requires regular monitoring and treatment to minimize inflammation to reduce risk of scarring. There is also a risk of cancer in the area which is very low  if inflammation is well controlled. Regular checks of the area are recommended. Please call if you notice any new or changing spots within this area.   fluconazole (DIFLUCAN) 200 MG tablet - Vaginal area  Irritant dermatitis Perivulvar creases  Secondary to incontinence.  Improved.  Continue Nystatin Cream to body folds daily.  Recommend patient use Vaseline to irritated areas and skin creases as a skin protectant every night before bed to reduce irritation.    Return in about 1 month (around 72/05/3662) for lichen planus.   IJamesetta Orleans, CMA, am acting as scribe for Brendolyn Patty, MD .  Documentation: I have reviewed the above documentation for accuracy and completeness, and I agree with the above.  Brendolyn Patty MD

## 2019-12-05 ENCOUNTER — Other Ambulatory Visit: Payer: Self-pay

## 2019-12-05 ENCOUNTER — Emergency Department: Payer: Medicare Other

## 2019-12-05 ENCOUNTER — Inpatient Hospital Stay
Admission: EM | Admit: 2019-12-05 | Discharge: 2019-12-08 | DRG: 638 | Disposition: A | Discharge status: Home / Self Care | Payer: Medicare Other | Attending: Internal Medicine | Admitting: Internal Medicine

## 2019-12-05 ENCOUNTER — Encounter: Payer: Self-pay | Admitting: Emergency Medicine

## 2019-12-05 DIAGNOSIS — I10 Essential (primary) hypertension: Secondary | ICD-10-CM | POA: Diagnosis not present

## 2019-12-05 DIAGNOSIS — E039 Hypothyroidism, unspecified: Secondary | ICD-10-CM | POA: Diagnosis present

## 2019-12-05 DIAGNOSIS — Z79899 Other long term (current) drug therapy: Secondary | ICD-10-CM | POA: Diagnosis not present

## 2019-12-05 DIAGNOSIS — R9082 White matter disease, unspecified: Secondary | ICD-10-CM | POA: Diagnosis present

## 2019-12-05 DIAGNOSIS — Z9842 Cataract extraction status, left eye: Secondary | ICD-10-CM

## 2019-12-05 DIAGNOSIS — N390 Urinary tract infection, site not specified: Secondary | ICD-10-CM | POA: Diagnosis present

## 2019-12-05 DIAGNOSIS — Z882 Allergy status to sulfonamides status: Secondary | ICD-10-CM | POA: Diagnosis not present

## 2019-12-05 DIAGNOSIS — Z20822 Contact with and (suspected) exposure to covid-19: Secondary | ICD-10-CM | POA: Diagnosis present

## 2019-12-05 DIAGNOSIS — Z9181 History of falling: Secondary | ICD-10-CM | POA: Diagnosis not present

## 2019-12-05 DIAGNOSIS — Z87891 Personal history of nicotine dependence: Secondary | ICD-10-CM | POA: Diagnosis not present

## 2019-12-05 DIAGNOSIS — Z9114 Patient's other noncompliance with medication regimen: Secondary | ICD-10-CM

## 2019-12-05 DIAGNOSIS — S32019A Unspecified fracture of first lumbar vertebra, initial encounter for closed fracture: Secondary | ICD-10-CM | POA: Diagnosis present

## 2019-12-05 DIAGNOSIS — Z809 Family history of malignant neoplasm, unspecified: Secondary | ICD-10-CM

## 2019-12-05 DIAGNOSIS — E860 Dehydration: Secondary | ICD-10-CM

## 2019-12-05 DIAGNOSIS — Z9841 Cataract extraction status, right eye: Secondary | ICD-10-CM

## 2019-12-05 DIAGNOSIS — R296 Repeated falls: Secondary | ICD-10-CM | POA: Diagnosis present

## 2019-12-05 DIAGNOSIS — E1165 Type 2 diabetes mellitus with hyperglycemia: Principal | ICD-10-CM | POA: Diagnosis present

## 2019-12-05 DIAGNOSIS — G4730 Sleep apnea, unspecified: Secondary | ICD-10-CM | POA: Diagnosis present

## 2019-12-05 DIAGNOSIS — F32A Depression, unspecified: Secondary | ICD-10-CM | POA: Diagnosis present

## 2019-12-05 DIAGNOSIS — W19XXXA Unspecified fall, initial encounter: Secondary | ICD-10-CM | POA: Diagnosis present

## 2019-12-05 DIAGNOSIS — Z8249 Family history of ischemic heart disease and other diseases of the circulatory system: Secondary | ICD-10-CM | POA: Diagnosis not present

## 2019-12-05 DIAGNOSIS — R29890 Loss of height: Secondary | ICD-10-CM | POA: Diagnosis present

## 2019-12-05 DIAGNOSIS — Z880 Allergy status to penicillin: Secondary | ICD-10-CM

## 2019-12-05 DIAGNOSIS — K219 Gastro-esophageal reflux disease without esophagitis: Secondary | ICD-10-CM | POA: Diagnosis present

## 2019-12-05 DIAGNOSIS — R739 Hyperglycemia, unspecified: Secondary | ICD-10-CM | POA: Diagnosis not present

## 2019-12-05 DIAGNOSIS — Z794 Long term (current) use of insulin: Secondary | ICD-10-CM

## 2019-12-05 DIAGNOSIS — E871 Hypo-osmolality and hyponatremia: Secondary | ICD-10-CM | POA: Diagnosis present

## 2019-12-05 DIAGNOSIS — S32010A Wedge compression fracture of first lumbar vertebra, initial encounter for closed fracture: Secondary | ICD-10-CM

## 2019-12-05 DIAGNOSIS — N183 Chronic kidney disease, stage 3 unspecified: Secondary | ICD-10-CM | POA: Diagnosis present

## 2019-12-05 DIAGNOSIS — W19XXXD Unspecified fall, subsequent encounter: Secondary | ICD-10-CM | POA: Diagnosis not present

## 2019-12-05 DIAGNOSIS — S32000A Wedge compression fracture of unspecified lumbar vertebra, initial encounter for closed fracture: Secondary | ICD-10-CM | POA: Diagnosis present

## 2019-12-05 DIAGNOSIS — N1832 Chronic kidney disease, stage 3b: Secondary | ICD-10-CM | POA: Diagnosis present

## 2019-12-05 DIAGNOSIS — I129 Hypertensive chronic kidney disease with stage 1 through stage 4 chronic kidney disease, or unspecified chronic kidney disease: Secondary | ICD-10-CM | POA: Diagnosis present

## 2019-12-05 DIAGNOSIS — E1122 Type 2 diabetes mellitus with diabetic chronic kidney disease: Secondary | ICD-10-CM | POA: Diagnosis present

## 2019-12-05 DIAGNOSIS — Z79890 Hormone replacement therapy (postmenopausal): Secondary | ICD-10-CM

## 2019-12-05 LAB — COMPREHENSIVE METABOLIC PANEL
ALT: 15 U/L (ref 0–44)
AST: 20 U/L (ref 15–41)
Albumin: 3.6 g/dL (ref 3.5–5.0)
Alkaline Phosphatase: 104 U/L (ref 38–126)
Anion gap: 12 (ref 5–15)
BUN: 31 mg/dL — ABNORMAL HIGH (ref 8–23)
CO2: 26 mmol/L (ref 22–32)
Calcium: 9.5 mg/dL (ref 8.9–10.3)
Chloride: 91 mmol/L — ABNORMAL LOW (ref 98–111)
Creatinine, Ser: 1.43 mg/dL — ABNORMAL HIGH (ref 0.44–1.00)
GFR, Estimated: 38 mL/min — ABNORMAL LOW (ref >60)
Glucose, Bld: 562 mg/dL (ref 70–99)
Potassium: 4.7 mmol/L (ref 3.5–5.1)
Sodium: 129 mmol/L — ABNORMAL LOW (ref 135–145)
Total Bilirubin: 0.9 mg/dL (ref 0.3–1.2)
Total Protein: 7 g/dL (ref 6.5–8.1)

## 2019-12-05 LAB — CBG MONITORING, ED: Glucose-Capillary: 315 mg/dL — ABNORMAL HIGH (ref 70–99)

## 2019-12-05 LAB — URINALYSIS, COMPLETE (UACMP) WITH MICROSCOPIC
Bacteria, UA: NONE SEEN
Bilirubin Urine: NEGATIVE
Glucose, UA: >=500 mg/dL — AB
Ketones, ur: 20 mg/dL — AB
Nitrite: NEGATIVE
Protein, ur: NEGATIVE mg/dL
Specific Gravity, Urine: 1.027 (ref 1.005–1.030)
WBC, UA: >50 WBC/hpf — ABNORMAL HIGH (ref 0–5)
pH: 6 (ref 5.0–8.0)

## 2019-12-05 LAB — CBC WITH DIFFERENTIAL/PLATELET
Abs Immature Granulocytes: 0.01 10*3/uL (ref 0.00–0.07)
Basophils Absolute: 0 10*3/uL (ref 0.0–0.1)
Basophils Relative: 1 %
Eosinophils Absolute: 0.2 10*3/uL (ref 0.0–0.5)
Eosinophils Relative: 3 %
HCT: 35.1 % — ABNORMAL LOW (ref 36.0–46.0)
Hemoglobin: 12.4 g/dL (ref 12.0–15.0)
Immature Granulocytes: 0 %
Lymphocytes Relative: 40 %
Lymphs Abs: 2 10*3/uL (ref 0.7–4.0)
MCH: 30.5 pg (ref 26.0–34.0)
MCHC: 35.3 g/dL (ref 30.0–36.0)
MCV: 86.2 fL (ref 80.0–100.0)
Monocytes Absolute: 0.4 10*3/uL (ref 0.1–1.0)
Monocytes Relative: 9 %
Neutro Abs: 2.3 10*3/uL (ref 1.7–7.7)
Neutrophils Relative %: 47 %
Platelets: 215 10*3/uL (ref 150–400)
RBC: 4.07 MIL/uL (ref 3.87–5.11)
RDW: 12.1 % (ref 11.5–15.5)
WBC: 4.9 10*3/uL (ref 4.0–10.5)
nRBC: 0 % (ref 0.0–0.2)

## 2019-12-05 LAB — TROPONIN I (HIGH SENSITIVITY): Troponin I (High Sensitivity): 14 ng/L (ref <18)

## 2019-12-05 MED ORDER — FLUCONAZOLE 100 MG PO TABS
200.0000 mg | ORAL_TABLET | Freq: Every day | ORAL | Status: DC
  Administered 2019-12-06 – 2019-12-08 (×3): 200 mg via ORAL
  Filled 2019-12-05 (×3): qty 2

## 2019-12-05 MED ORDER — FENTANYL CITRATE (PF) 100 MCG/2ML IJ SOLN: 25.0000 ug | Freq: Once | INTRAMUSCULAR | Status: DC

## 2019-12-05 MED ORDER — SODIUM CHLORIDE 0.9 % IV SOLN: Freq: Once | INTRAVENOUS | Status: DC

## 2019-12-05 MED ORDER — PAROXETINE HCL 20 MG PO TABS
40.0000 mg | ORAL_TABLET | ORAL | Status: DC
  Administered 2019-12-06 – 2019-12-08 (×3): 40 mg via ORAL
  Filled 2019-12-05 (×3): qty 2

## 2019-12-05 MED ORDER — PANTOPRAZOLE SODIUM 40 MG PO TBEC
40.0000 mg | DELAYED_RELEASE_TABLET | Freq: Every day | ORAL | Status: DC
  Administered 2019-12-06 – 2019-12-08 (×3): 40 mg via ORAL
  Filled 2019-12-05 (×3): qty 1

## 2019-12-05 MED ORDER — SODIUM CHLORIDE 0.9 % IV SOLN: INTRAVENOUS | Status: DC

## 2019-12-05 MED ORDER — SODIUM CHLORIDE 0.9 % IV SOLN
1.0000 g | Freq: Once | INTRAVENOUS | Status: AC
  Administered 2019-12-05: 1 g via INTRAVENOUS
  Filled 2019-12-05: qty 10

## 2019-12-05 MED ORDER — SODIUM CHLORIDE 0.9 % IV BOLUS
1000.0000 mL | Freq: Once | INTRAVENOUS | Status: AC
  Administered 2019-12-05: 1000 mL via INTRAVENOUS

## 2019-12-05 MED ORDER — PRAVASTATIN SODIUM 40 MG PO TABS
80.0000 mg | ORAL_TABLET | Freq: Every day | ORAL | Status: DC
  Administered 2019-12-06 – 2019-12-08 (×3): 80 mg via ORAL
  Filled 2019-12-05: qty 4
  Filled 2019-12-05: qty 2
  Filled 2019-12-05: qty 4
  Filled 2019-12-05 (×2): qty 2
  Filled 2019-12-05: qty 4

## 2019-12-05 MED ORDER — FERROUS SULFATE 325 (65 FE) MG PO TABS
325.0000 mg | ORAL_TABLET | Freq: Every day | ORAL | Status: DC
  Administered 2019-12-06 – 2019-12-08 (×3): 325 mg via ORAL
  Filled 2019-12-05 (×3): qty 1

## 2019-12-05 MED ORDER — INSULIN ASPART 100 UNIT/ML ~~LOC~~ SOLN
5.0000 [IU] | Freq: Once | SUBCUTANEOUS | Status: AC
  Administered 2019-12-05: 5 [IU] via INTRAVENOUS
  Filled 2019-12-05: qty 1

## 2019-12-05 MED ORDER — LISINOPRIL 5 MG PO TABS
5.0000 mg | ORAL_TABLET | Freq: Every day | ORAL | Status: DC
  Administered 2019-12-07 – 2019-12-08 (×2): 5 mg via ORAL
  Filled 2019-12-05 (×3): qty 1

## 2019-12-05 MED ORDER — LEVOTHYROXINE SODIUM 50 MCG PO TABS
75.0000 ug | ORAL_TABLET | Freq: Every day | ORAL | Status: DC
  Administered 2019-12-06 – 2019-12-08 (×3): 75 ug via ORAL
  Filled 2019-12-05 (×3): qty 2

## 2019-12-05 MED ORDER — ACETAMINOPHEN 500 MG PO TABS
1000.0000 mg | ORAL_TABLET | Freq: Once | ORAL | Status: AC
  Administered 2019-12-05: 1000 mg via ORAL
  Filled 2019-12-05: qty 2

## 2019-12-05 MED ORDER — INSULIN ASPART 100 UNIT/ML ~~LOC~~ SOLN
0.0000 [IU] | Freq: Three times a day (TID) | SUBCUTANEOUS | Status: DC
  Administered 2019-12-06: 5 [IU] via SUBCUTANEOUS
  Administered 2019-12-06: 2 [IU] via SUBCUTANEOUS
  Administered 2019-12-06: 11 [IU] via SUBCUTANEOUS
  Administered 2019-12-07: 5 [IU] via SUBCUTANEOUS
  Administered 2019-12-07 – 2019-12-08 (×2): 2 [IU] via SUBCUTANEOUS
  Filled 2019-12-05 (×8): qty 1

## 2019-12-05 MED ORDER — INSULIN DETEMIR 100 UNIT/ML ~~LOC~~ SOLN
20.0000 [IU] | Freq: Every day | SUBCUTANEOUS | Status: DC
  Administered 2019-12-06 – 2019-12-07 (×2): 20 [IU] via SUBCUTANEOUS
  Filled 2019-12-05 (×3): qty 0.2

## 2019-12-05 MED ORDER — SODIUM CHLORIDE 0.9 % IV SOLN
1.0000 g | INTRAVENOUS | Status: DC
  Administered 2019-12-06 – 2019-12-07 (×2): 1 g via INTRAVENOUS
  Filled 2019-12-05 (×2): qty 10
  Filled 2019-12-05: qty 1

## 2019-12-05 MED ORDER — BUPROPION HCL ER (XL) 150 MG PO TB24
150.0000 mg | ORAL_TABLET | Freq: Every day | ORAL | Status: DC
  Administered 2019-12-06 – 2019-12-08 (×3): 150 mg via ORAL
  Filled 2019-12-05 (×3): qty 1

## 2019-12-05 MED ORDER — TORSEMIDE 10 MG PO TABS
10.0000 mg | ORAL_TABLET | Freq: Every day | ORAL | Status: DC
  Administered 2019-12-07 – 2019-12-08 (×2): 10 mg via ORAL
  Filled 2019-12-05 (×3): qty 1

## 2019-12-05 MED ORDER — ACETAMINOPHEN 500 MG PO TABS
500.0000 mg | ORAL_TABLET | Freq: Four times a day (QID) | ORAL | Status: DC | PRN
  Administered 2019-12-06 – 2019-12-07 (×3): 500 mg via ORAL
  Filled 2019-12-05 (×3): qty 1

## 2019-12-05 MED ORDER — ENOXAPARIN SODIUM 40 MG/0.4ML ~~LOC~~ SOLN
40.0000 mg | SUBCUTANEOUS | Status: DC
  Administered 2019-12-06 – 2019-12-07 (×3): 40 mg via SUBCUTANEOUS
  Filled 2019-12-05 (×3): qty 0.4

## 2019-12-05 NOTE — ED Notes (Signed)
Pt ambulatory to toilet with cane and 1 person assist. Pt using cane in front of body using both hands on cane. Gait unsteady. Pt assisted back to bed and connected to monitor. Denies further needs at this time.

## 2019-12-05 NOTE — ED Notes (Signed)
Date and time results received: 12/05/19 2324 (use smartphrase ".now" to insert current time)  Test: Blood Glucose Critical Value: 562  Name of Provider Notified: Dr. Ellender Hose  Orders Received? Or Actions Taken?: Provider notififed.

## 2019-12-05 NOTE — H&P (Addendum)
History and Physical    Abigail Walker WCB:[id number] DOB: 11-May-1945 DOA: 12/05/2019  PCP: Kirk Ruths, MD   Patient coming from: Home  I have personally briefly reviewed patient's old medical records in [place]  Chief Complaint: Falls  HPI: Abigail Walker is a 74 y.o. female with medical history significant for diabetes mellitus with complications of stage III chronic kidney disease, hypothyroidism, hypertension, recurrent falls and GERD who was brought into the ER by EMS for evaluation of multiple falls secondary to vertigo with complaints of low back pain.  Patient noted to have bruising to the arms and legs. Patient reports multiple falls over the last 4 days and called EMS today after she fell landing on her buttocks and developed low back pain which she rates a 5 x 10 in intensity at its worse.  She denies having any urinary or fecal incontinence and is able to move her lower extremities.  She is normally ambulatory with a cane and despite the use of the cane has had falls at home due to difficulty keeping her balance.  She states that she has had difficulty getting a rolling walker.  She lives alone.  She complains of generalized weakness, frequency of urination, increased thirst and dysuria but denies having any fever or chills, no abdominal pain, no nausea, no vomiting, no chest pain, no shortness of breath no cough. Labs show sodium 129, potassium 4.7, chloride 91, bicarb 26, glucose 562, BUN 31, creatinine 1.43, calcium 9.5, alkaline phosphatase 104, albumin 3.6, AST 20, ALT 15, total protein 7.3, troponin XIV, white count 4.9, hemoglobin 12.4, hematocrit 35.1, MCV 86.2, RDW 12.1, platelet count 215 Urine analysis shows moderate leukocyte esterase, negative nitrites CT scan of the head without contrast shows no acute intracranial abnormality.  Moderate parenchymal volume loss and chronic microvascular ischemic white matter disease. CT scan of cervical spine shows no  acute fracture or traumatic listhesis of the cervical spine.  Multilevel degenerative changes of the cervical spine. CT scan of lumbar spine shows acute superior compression fracture of the L1 vertebral body with buckling of the anterior superior cortex and less than 25% loss of height.  No propulsion of fragments is seen. Viral respiratory panel is pending Twelve-lead EKG shows normal sinus rhythm   ED Course: Patient is a 74 year old female who presents to the ER for evaluation of frequent falls and is noted to have an acute L1 vertebral body compression fracture, hyperglycemia as well as UTI.  She will be admitted to the hospital for further evaluation  Review of Systems: As per HPI otherwise 10 point review of systems negative.    Past Medical History:  Diagnosis Date  . Acid reflux 06/16/2013  . Adult hypothyroidism 06/16/2013   Last Assessment & Plan:  Tsh and energy have been stable.    . Benign hypertension 06/16/2013   Last Assessment & Plan:  Patients blood pressure has seemingly been controlled without significant side effects such as dizziness or slow heart rate.     . Cancer ([place]) skin  . Sherran Needs syndrome   . Chronic kidney disease   . Depression   . Diabetes mellitus without complication ([place])   . GERD (gastroesophageal reflux disease)   . HBV (hepatitis B virus) infection   . High blood pressure   . Hives    food related  . Hypothyroidism   . Lichen planus   . Mixed incontinence   . Obstructive apnea 06/16/2013  . Sleep apnea   .  Thyroid disease     Past Surgical History:  Procedure Laterality Date  . CATARACT EXTRACTION Bilateral   . ESOPHAGOGASTRODUODENOSCOPY (EGD) WITH PROPOFOL N/A 06/08/2015   Procedure: ESOPHAGOGASTRODUODENOSCOPY (EGD) WITH PROPOFOL;  Surgeon: Hulen Luster, MD;  Location: [facility] ENDOSCOPY;  Service: Gastroenterology;  Laterality: N/A;  . HEMORRHOID SURGERY    . IMPLANTABLE CONTACT LENS IMPLANTATION    . TONSILLECTOMY    . TUBAL LIGATION        reports that she quit smoking about 26 years ago. She has never used smokeless tobacco. She reports that she does not drink alcohol and does not use drugs.  Allergies  Allergen Reactions  . Other     Other reaction(s): Other (See Comments), Other (See Comments) CLINDYAMYCIN = UNKNOWN CLINDYAMYCIN = UNKNOWN   . Sulfa Antibiotics Nausea And Vomiting  . Amoxicillin-Pot Clavulanate Rash  . Hydrocortisone-Iodoquinol  [Hydrocortisone-Iodoquinol] Rash    Family History  Problem Relation Age of Onset  . Heart disease Father   . Cancer Mother   . Kidney failure Maternal Aunt      Prior to Admission medications   Medication Sig Start Date End Date Taking? Authorizing Provider  acetaminophen (TYLENOL) 500 MG tablet Take 500 mg by mouth every 6 (six) hours as needed for moderate pain.   Yes [provider]  clobetasol ointment (TEMOVATE) 0.05 % Apply as directed to affected areas groin twice a day. 11/19/19  Yes Brendolyn Patty, MD  fluconazole (DIFLUCAN) 200 MG tablet Take 1 tablet (200 mg total) by mouth daily. 11/19/19 12/19/19 Yes Brendolyn Patty, MD  pravastatin (PRAVACHOL) 80 MG tablet Take 80 mg by mouth daily.  04/20/15  Yes [provider]  azelastine (ASTELIN) 0.1 % nasal spray Place into the nose. Patient not taking: Reported on 08/12/2019    [provider]  Biotin w/ Vitamins C & E (HAIR/SKIN/NAILS PO) Take 1 tablet by mouth daily. Patient not taking: Reported on 08/12/2019    [provider]  buPROPion (WELLBUTRIN XL) 150 MG 24 hr tablet Take 3 tablet po QD as directed    [provider]  Cholecalciferol (VITAMIN D3) 2000 UNITS capsule Take by mouth. Patient not taking: Reported on 08/12/2019    [provider]  ferrous sulfate 325 (65 FE) MG tablet Take 325 mg by mouth daily with breakfast.    [provider]  hydrocortisone cream 1 % Apply 1 application topically as needed for itching.    [provider]   insulin NPH-regular Human (NOVOLIN 70/30) (70-30) 100 UNIT/ML injection Inject 16 units before breakfast and 26 units before supper as directed 11/13/13   [provider]  insulin NPH-regular Human (NOVOLIN 70/30) (70-30) 100 UNIT/ML injection INJECT 30 UNITS SUBCUTANEOUSLY TWICE DAILY BEFORE A MEAL 11/15/18   [provider]  Insulin Syringe-Needle U-100 (INSULIN SYRINGE 1CC/31GX5/16") 31G X 5/16" 1 ML MISC USE ONE SYRINGE TWICE DAILY AS DIRECTED WITH INSULIN VIALS 05/26/14   [provider]  Iodoquinol-HC-Aloe Polysacch (ALCORTIN A) 1-2-1 % GEL Apply 1 application topically at bedtime. 11/11/16   [provider]  levothyroxine (SYNTHROID, LEVOTHROID) 75 MCG tablet TAKE ONE TABLET BY MOUTH ONCE DAILY 05/21/14   [provider]  lisinopril (ZESTRIL) 5 MG tablet  03/30/19   [provider]  Multiple Vitamins-Minerals (HAIR SKIN AND NAILS FORMULA PO) Take by mouth. Patient not taking: Reported on 08/12/2019    [provider]  neomycin-bacitracin-polymyxin (NEOSPORIN) ointment Apply 1 application topically as needed for wound care.  [provider]  nystatin cream (MYCOSTATIN) Apply 1 application topically as needed. 01/08/14   [provider]  pantoprazole (PROTONIX) 40 MG tablet Take 40 mg by mouth daily.  Patient not taking: Reported on 08/12/2019 07/07/14   [provider]  PARoxetine (PAXIL) 40 MG tablet Take 40 mg by mouth every morning.     [provider]  pioglitazone (ACTOS) 30 MG tablet  03/18/19   [provider]  TEA TREE OIL EX Apply topically as needed.    [provider]  torsemide (DEMADEX) 10 MG tablet Take 10 mg by mouth daily.  04/03/19   [provider]  [place] as needed. [facility] antibiotic and pain relief cream    [provider]    Physical Exam: Vitals:   12/05/19 2127 12/05/19 2136 12/05/19 2146 12/05/19 2231  BP:  (!) 147/74  (!) 121/55   Pulse:  89  88  Resp:  18  20  Temp:      TempSrc:      SpO2:  100% 100% 100%  Weight: 56.7 kg     Height: 5\' 1"  (1.549 m)        Vitals:   12/05/19 2127 12/05/19 2136 12/05/19 2146 12/05/19 2231  BP:  (!) 147/74  (!) 121/55  Pulse:  89  88  Resp:  18  20  Temp:      TempSrc:      SpO2:  100% 100% 100%  Weight: 56.7 kg     Height: 5\' 1"  (1.549 m)       Constitutional: NAD, alert and oriented x 3.  Eyes: PERRL, lids and conjunctivae normal ENMT: Mucous membranes are moist.  Neck: normal, supple, no masses, no thyromegaly Respiratory: clear to auscultation bilaterally, no wheezing, no crackles. Normal respiratory effort. No accessory muscle use.  Cardiovascular: Regular rate and rhythm, no murmurs / rubs / gallops. No extremity edema. 2+ pedal pulses. No carotid bruits.  Abdomen: no tenderness, no masses palpated. No hepatosplenomegaly. Bowel sounds positive.  Musculoskeletal: no clubbing / cyanosis. No joint deformity upper and lower extremities.  Skin: no rashes, lesions, ulcers.  Ecchymosis over both legs and forearms Neurologic: No gross focal neurologic deficit.  Generalized weakness Psychiatric: Normal mood and affect.   Labs on Admission: I have personally reviewed following labs and imaging studies  CBC: Recent Labs  Lab 12/05/19 2231  WBC 4.9  NEUTROABS 2.3  HGB 12.4  HCT 35.1*  MCV 86.2  PLT 025   Basic Metabolic Panel: Recent Labs  Lab 12/05/19 2231  NA 129*  K 4.7  CL 91*  CO2 26  GLUCOSE 562*  BUN 31*  CREATININE 1.43*  CALCIUM 9.5   GFR: Estimated Creatinine Clearance: 25.7 mL/min (A) (by C-G formula based on SCr of 1.43 mg/dL (H)). Liver Function Tests: Recent Labs  Lab 12/05/19 2231  AST 20  ALT 15  ALKPHOS 104  BILITOT 0.9  PROT 7.0  ALBUMIN 3.6   No results for input(s): LIPASE, AMYLASE in the last 168 hours. No results for input(s): AMMONIA in the last 168 hours. Coagulation Profile: No results for input(s): INR, PROTIME  in the last 168 hours. Cardiac Enzymes: No results for input(s): CKTOTAL, CKMB, CKMBINDEX, TROPONINI in the last 168 hours. BNP (last 3 results) No results for input(s): PROBNP in the last 8760 hours. HbA1C: No results for input(s): HGBA1C in the last 72 hours. CBG: Recent Labs  Lab 12/05/19 2358  GLUCAP 315*   Lipid Profile:  No results for input(s): CHOL, HDL, LDLCALC, TRIG, CHOLHDL, LDLDIRECT in the last 72 hours. Thyroid Function Tests: No results for input(s): TSH, T4TOTAL, FREET4, T3FREE, THYROIDAB in the last 72 hours. Anemia Panel: No results for input(s): VITAMINB12, FOLATE, FERRITIN, TIBC, IRON, RETICCTPCT in the last 72 hours. Urine analysis:    Component Value Date/Time   COLORURINE YELLOW (A) 12/05/2019 2231   APPEARANCEUR HAZY (A) 12/05/2019 2231   APPEARANCEUR Clear 09/29/2014 1523   LABSPEC 1.027 12/05/2019 2231   PHURINE 6.0 12/05/2019 2231   GLUCOSEU >=500 (A) 12/05/2019 2231   HGBUR SMALL (A) 12/05/2019 2231   BILIRUBINUR NEGATIVE 12/05/2019 2231   BILIRUBINUR Negative 09/29/2014 1523   KETONESUR 20 (A) 12/05/2019 2231   PROTEINUR NEGATIVE 12/05/2019 2231   NITRITE NEGATIVE 12/05/2019 2231   LEUKOCYTESUR MODERATE (A) 12/05/2019 2231    Radiological Exams on Admission: DG Ribs Unilateral W/Chest Left  Result Date: 12/05/2019 CLINICAL DATA:  74 year old female with fall. EXAM: LEFT RIBS AND CHEST - 3+ VIEW COMPARISON:  None. FINDINGS: No focal consolidation, pleural effusion, pneumothorax. There is chronic interstitial coarsening and bronchitic changes. The cardiac silhouette is within limits. Atherosclerotic calcification of the aorta. Osteopenia with degenerative changes of the spine. No acute osseous pathology. No displaced rib fractures. IMPRESSION: No acute cardiopulmonary process. Electronically Signed   By: Anner Crete M.D.   On: 12/05/2019 23:09   CT Head Wo Contrast  Result Date: 12/05/2019 CLINICAL DATA:  Frequent falls EXAM: CT HEAD  WITHOUT CONTRAST CT CERVICAL SPINE WITHOUT CONTRAST TECHNIQUE: Multidetector CT imaging of the head and cervical spine was performed following the standard protocol without intravenous contrast. Multiplanar CT image reconstructions of the cervical spine were also generated. COMPARISON:  CT 10/27/2018, MR 03/21/2018 FINDINGS: CT HEAD FINDINGS Brain: No evidence of acute infarction, hemorrhage, hydrocephalus, extra-axial collection, visible mass lesion or mass effect. Symmetric prominence of the ventricles, cisterns and sulci compatible with moderate parenchymal volume loss. Mixed patchy and confluent areas of white matter hypoattenuation are most compatible with chronic microvascular angiopathy. Benign dural calcifications. Cerebellar tonsils are normally positioned. Midline intracranial structures are unremarkable. None Vascular: Atherosclerotic calcification of the carotid siphons and intradural vertebral arteries. No hyperdense vessel. Skull: Chronic scarring in the right frontal scalp. No acute scalp swelling or hematoma. No calvarial fracture or worrisome acute osseous lesions. Sinuses/Orbits: Paranasal sinuses and mastoid air cells are predominantly clear. Orbital structures are unremarkable aside from prior lens extractions. Other: None. CT CERVICAL SPINE FINDINGS Alignment: Cervical stabilization collar is absent at the time of examination. Mild cervical flexion noted on the scout view. Slight leftward cranial rotation is present as well. There is straightening and reversal the normal cervical lordosis which is unchanged from prior and favored to be on a degenerative basis, slightly accentuated by positioning. No evidence of traumatic listhesis. No abnormally widened, perched or jumped facets. Normal alignment of the craniocervical and atlantoaxial articulations. Skull base and vertebrae: No acute skull base fracture. No vertebral body fracture or height loss. Normal bone mineralization. No worrisome osseous  lesions. Mild atlantodental arthrosis. Cervical spondylitic changes, as below. Soft tissues and spinal canal: No pre or paravertebral fluid or swelling. No visible canal hematoma. Disc levels: Multilevel intervertebral disc height loss with spondylitic endplate changes. Multilevel disc osteophyte complex formations are present which result in some mild canal stenoses at C3-4, C5-6. Additional levels fat partial effacement of the ventral thecal sac. Multilevel uncinate spurring and facet hypertrophic changes are present as well resulting in mild-to-moderate multilevel neural foraminal narrowing without  severe foraminal impingement. Upper chest: No acute abnormality in the upper chest or imaged lung apices. Mild apical emphysematous changes. Cervical carotid atherosclerosis. Additional calcifications in the proximal great vessels. Other: Normal thyroid IMPRESSION: 1. No acute intracranial abnormality. 2. Moderate parenchymal volume loss and chronic microvascular ischemic white matter disease. 3. No acute fracture or traumatic listhesis of the cervical spine. 4. Multilevel degenerative changes of the cervical spine as described above. 5. Cervical and intracranial atherosclerosis. 6. Emphysema (ICD10-J43.9). Electronically Signed   By: Lovena Le M.D.   On: 12/05/2019 23:21   CT Cervical Spine Wo Contrast  Result Date: 12/05/2019 CLINICAL DATA:  Frequent falls EXAM: CT HEAD WITHOUT CONTRAST CT CERVICAL SPINE WITHOUT CONTRAST TECHNIQUE: Multidetector CT imaging of the head and cervical spine was performed following the standard protocol without intravenous contrast. Multiplanar CT image reconstructions of the cervical spine were also generated. COMPARISON:  CT 10/27/2018, MR 03/21/2018 FINDINGS: CT HEAD FINDINGS Brain: No evidence of acute infarction, hemorrhage, hydrocephalus, extra-axial collection, visible mass lesion or mass effect. Symmetric prominence of the ventricles, cisterns and sulci compatible with  moderate parenchymal volume loss. Mixed patchy and confluent areas of white matter hypoattenuation are most compatible with chronic microvascular angiopathy. Benign dural calcifications. Cerebellar tonsils are normally positioned. Midline intracranial structures are unremarkable. None Vascular: Atherosclerotic calcification of the carotid siphons and intradural vertebral arteries. No hyperdense vessel. Skull: Chronic scarring in the right frontal scalp. No acute scalp swelling or hematoma. No calvarial fracture or worrisome acute osseous lesions. Sinuses/Orbits: Paranasal sinuses and mastoid air cells are predominantly clear. Orbital structures are unremarkable aside from prior lens extractions. Other: None. CT CERVICAL SPINE FINDINGS Alignment: Cervical stabilization collar is absent at the time of examination. Mild cervical flexion noted on the scout view. Slight leftward cranial rotation is present as well. There is straightening and reversal the normal cervical lordosis which is unchanged from prior and favored to be on a degenerative basis, slightly accentuated by positioning. No evidence of traumatic listhesis. No abnormally widened, perched or jumped facets. Normal alignment of the craniocervical and atlantoaxial articulations. Skull base and vertebrae: No acute skull base fracture. No vertebral body fracture or height loss. Normal bone mineralization. No worrisome osseous lesions. Mild atlantodental arthrosis. Cervical spondylitic changes, as below. Soft tissues and spinal canal: No pre or paravertebral fluid or swelling. No visible canal hematoma. Disc levels: Multilevel intervertebral disc height loss with spondylitic endplate changes. Multilevel disc osteophyte complex formations are present which result in some mild canal stenoses at C3-4, C5-6. Additional levels fat partial effacement of the ventral thecal sac. Multilevel uncinate spurring and facet hypertrophic changes are present as well resulting in  mild-to-moderate multilevel neural foraminal narrowing without severe foraminal impingement. Upper chest: No acute abnormality in the upper chest or imaged lung apices. Mild apical emphysematous changes. Cervical carotid atherosclerosis. Additional calcifications in the proximal great vessels. Other: Normal thyroid IMPRESSION: 1. No acute intracranial abnormality. 2. Moderate parenchymal volume loss and chronic microvascular ischemic white matter disease. 3. No acute fracture or traumatic listhesis of the cervical spine. 4. Multilevel degenerative changes of the cervical spine as described above. 5. Cervical and intracranial atherosclerosis. 6. Emphysema (ICD10-J43.9). Electronically Signed   By: Lovena Le M.D.   On: 12/05/2019 23:21   CT Thoracic Spine Wo Contrast  Result Date: 12/06/2019 CLINICAL DATA:  Frequent falls EXAM: CT THORACIC SPINE WITHOUT CONTRAST TECHNIQUE: Multidetector CT images of the thoracic were obtained using the standard protocol without intravenous contrast. COMPARISON:  Contemporary lumbar spine radiographs  FINDINGS: Segmentation: 12 rib-bearing thoracic type vertebral levels. Alignment: Mild exaggeration of the normal thoracic kyphosis. No abnormally widened, perched or jumped facets. Vertebrae: The osseous structures appear diffusely demineralized which may limit detection of small or nondisplaced fractures. Slightly sclerotic endplate changes and central cuffing in the inferior endplate T12 with a more lucent Schmorl's node formation and intervertebral extension of nitrogenous gas from the disc space may reflect an acute to subacute Schmorl's node formation rather than true compression deformity. No other acute thoracic spine fracture or vertebral body height loss is seen. Posterior elements and transverse processes are intact. Redemonstration of an incomplete L1 burst fracture with up to 25% height loss, as described on dedicated lumbar spine imaging. Multilevel discogenic changes  and more mild facet degenerative change are noted throughout the thoracic spine, better detailed below. Paraspinal and other soft tissues: Paravertebral thickening adjacent the T12-L1 disc space likely related to the acute compression deformity. No other visible perispinal fluid, swelling, gas or hemorrhage. No visible canal hematoma. Included portions of the chest and upper abdomen demonstrate extensive aortic atherosclerosis and calcification throughout the branch vessels. Three-vessel coronary artery atherosclerosis noted as well. 3 mm solid nodule is seen in the anterosuperior segment right upper lobe (2/33). No other acute or worrisome abnormality in the included portions of the posterior chest and abdomen. Disc levels: Diffuse intervertebral disc height loss with multilevel desiccation and vascular disc, endplate sclerosis and anterior predominant spurring with multilevel Schmorl's node formation including the more acute to subacute appearing formation in the inferior endplate T12. Despite these findings, there is no significant spinal canal stenosis or neural foraminal narrowing within the thoracic levels. IMPRESSION: 1. L1 compression deformity, better detailed dedicated lumbar imaging. 2. Slightly sclerotic endplate change and central cupping in the inferior endplate T12 with a rounded lucency and extension of nitrogenous gas from the disc space is favored to reflect an acute to subacute Schmorl's node formation rather than true compression deformity. 3. No other acute thoracic spine fracture or vertebral body height loss. 4. Multilevel degenerative changes of the thoracic spine without significant spinal canal stenosis or neural foraminal narrowing within the thoracic levels. 5. 3 mm solid nodule in the anterosuperior segment right upper lobe. No follow-up needed if patient is low-risk. Non-contrast chest CT can be considered in 12 months if patient is high-risk. This recommendation follows the consensus  statement: Guidelines for Management of Incidental Pulmonary Nodules Detected on CT Images: From the Fleischner Society 2017; Radiology 2017; 284:[id number]. 6. Three-vessel coronary artery atherosclerosis. 7. Aortic Atherosclerosis (ICD10-I70.0). Electronically Signed   By: Lovena Le M.D.   On: 12/06/2019 00:01   CT Lumbar Spine Wo Contrast  Result Date: 12/05/2019 CLINICAL DATA:  Back pain after fall EXAM: CT LUMBAR SPINE WITHOUT CONTRAST TECHNIQUE: Multidetector CT imaging of the lumbar spine was performed without intravenous contrast administration. Multiplanar CT image reconstructions were also generated. COMPARISON:  MR lumbar spine Jun 08, 2019 FINDINGS: Segmentation: There are 5 non-rib bearing lumbar type vertebral bodies with the last intervertebral disc space labeled as L5-S1. Alignment: There is a mild leftward curvature of the lumbar spine. There is a minimal anterolisthesis of L4 on L5 and L5 on S1. Vertebrae: There is an acute superior compression fracture of the L1 vertebral body with slight buckling of the anterior superior cortex and less than 25% loss in height. No retropulsion of fragments or extension to the posterior elements is seen. Paraspinal and other soft tissues: The paraspinal soft tissues and visualized retroperitoneal structures are  unremarkable. The sacroiliac joints are intact. Disc levels: Mild lumbar spine spondylosis most notable at L3-L4 and L4-L5 with moderate neural foraminal narrowing and mild central canal stenosis. IMPRESSION: Acute superior compression fracture of the L1 vertebral body with buckling of the anterior superior cortex and less than 25% loss in height. No retropulsion of fragments is seen. Electronically Signed   By: Prudencio Pair M.D.   On: 12/05/2019 23:19   DG HIP UNILAT WITH PELVIS 2-3 VIEWS LEFT  Result Date: 12/05/2019 CLINICAL DATA:  Pain EXAM: DG HIP (WITH OR WITHOUT PELVIS) 2-3V LEFT COMPARISON:  None. FINDINGS: There is no evidence of hip  fracture or dislocation. Moderate left hip osteoarthritis is seen with superior joint space loss and marginal osteophyte formation. Scattered vascular calcifications are noted. No focal soft tissue swelling. IMPRESSION: No acute osseous abnormality. Electronically Signed   By: Prudencio Pair M.D.   On: 12/05/2019 23:09   DG HIP UNILAT WITH PELVIS 2-3 VIEWS RIGHT  Result Date: 12/05/2019 CLINICAL DATA:  Frequent falls EXAM: DG HIP (WITH OR WITHOUT PELVIS) 2-3V RIGHT COMPARISON:  None. FINDINGS: Hip joints and SI joints symmetric. No acute bony abnormality. Specifically, no fracture, subluxation, or dislocation. IMPRESSION: No acute bony abnormality. Electronically Signed   By: Rolm Baptise M.D.   On: 12/05/2019 23:09    EKG: Independently reviewed.  Normal sinus rhythm  Assessment/Plan Principal Problem:   Hyperglycemia due to diabetes mellitus (HCC) Active Problems:   Benign hypertension   Adult hypothyroidism   Stage 3 chronic kidney disease (HCC)   Acute lower UTI   Depression   Lumbar compression fracture (HCC)   Hyponatremia    Diabetes mellitus with complications of hyperglycemia and chronic kidney disease stage III Patient has a known history of diabetes mellitus and is noted to have hyperglycemia without evidence of metabolic acidosis Hyperglycemia is secondary to medication non compliance as patient states that she missed her insulin for a couple of days Aggressive IV fluid hydration We will place patient on Levemir 20 units daily at bedtime as well as sliding scale coverage Patient will require diabetic education as well as nutritionist evaluation Patient has stage III chronic kidney disease which is stable   Depression Continue bupropion and Paxil   Hypothyroidism Continue Synthroid   Hypertension Continue lisinopril   Frequent falls with lumbar compression fracture Place patient on fall precautions Pain control Put patient TLSO brace Neurosurgery consult in  a.m.    Urinary tract infection Patient with urinary symptoms and noted to have pyuria We will treat patient empirically with Rocephin 1 g IV daily   Hyponatremia Most likely secondary to hyperglycemia and will improve following resolution of hyperglycemia with hydration   DVT prophylaxis: Lovenox Code Status: Full code Family Communication: Greater than 50% of time was spent discussing patient's condition and plan of care with her at the bedside.  All questions and concerns have been addressed.  She verbalizes understanding and agrees with the plan.  Patient does not have a designated healthcare power of attorney and states that she does not have any family.  CODE STATUS was discussed and she request to be a full code Disposition Plan: SNF Consults called: Neurosurgery    Brittnye Josephs MD [facility]     12/06/2019, 12:08 AM

## 2019-12-05 NOTE — ED Triage Notes (Addendum)
Pt arrived via [facility] from home with reports of frequent falls, pt states she twisted her back, pt states she fell on R hip bone and then again fell on tailbone. Pt has had multiple falls since Sunday.  Pt uses cane for ambulation.  Pt  Reports she has been falling due to dizziness and vertigo sxs.   Pt also c/o L rib pain

## 2019-12-05 NOTE — ED Triage Notes (Signed)
EMS brings pt in from home; reports frequent falling recently for "vertigo"; last fall on Sunday; c/o pain to back; bruising to arms/legs; pt lives alone; pt to lobby via w/c with no distress noted

## 2019-12-05 NOTE — ED Notes (Signed)
Pt transported to CT ?

## 2019-12-05 NOTE — ED Provider Notes (Signed)
[facility] Emergency Department Provider Note  ____________________________________________   First MD Initiated Contact with Patient 12/05/19 2146     (approximate)  I have reviewed the triage vital signs and the nursing notes.   HISTORY  Chief Complaint Fall and Dizziness    HPI Abigail Walker is a 74 y.o. female  With PMHx CKD, DM, recurrent falls, here with falls and weakness.  Patient states that for the last week, she has had progressive worsening generalized weakness.  She states she has been thirsty, weak, and has had difficulty keeping her balance.  She has a history of recurrent falls and falls fairly frequently at home, but has fallen essentially multiple times per day over the last week.  Earlier today, she fell, and states she has had some moderate lower back pain since then, as well as general weakness.  She states she had difficulty even getting herself up this time, which is why she presents to the ER.  She notes she has been thirsty more than usual and has been going to the bathroom frequently.  No known fevers or chills.  No flank pain.  No nausea or vomiting.  No recent medication changes.        Past Medical History:  Diagnosis Date  . Acid reflux 06/16/2013  . Adult hypothyroidism 06/16/2013   Last Assessment & Plan:  Tsh and energy have been stable.    . Benign hypertension 06/16/2013   Last Assessment & Plan:  Patients blood pressure has seemingly been controlled without significant side effects such as dizziness or slow heart rate.     . Cancer ([place]) skin  . Sherran Needs syndrome   . Chronic kidney disease   . Depression   . Diabetes mellitus without complication ([place])   . GERD (gastroesophageal reflux disease)   . HBV (hepatitis B virus) infection   . High blood pressure   . Hives    food related  . Hypothyroidism   . Lichen planus   . Mixed incontinence   . Obstructive apnea 06/16/2013  . Sleep apnea   . Thyroid disease      Patient Active Problem List   Diagnosis Date Noted  . Hyperlipidemia 08/13/2019  . Hypercalcemia 05/01/2019  . Leukopenia 01/17/2019  . Stage 3 chronic kidney disease ([place]) 01/17/2019  . Benign hypertensive kidney disease with chronic kidney disease 01/16/2019  . Hyposmolality and/or hyponatremia 01/16/2019  . Secondary hyperparathyroidism of renal origin ([place]) 01/16/2019  . Carotid artery disease ([place]) 08/10/2017  . Chronic midline low back pain without sciatica 03/16/2017  . Primary osteoarthritis of both hands 01/16/2017  . Benign essential tremor 08/12/2015  . MGUS (monoclonal gammopathy of unknown significance) 07/08/2015  . Mixed incontinence 11/09/2014  . Atrophic vaginitis 07/28/2014  . Encounter for general adult medical examination without abnormal findings 05/28/2014  . Major depression in remission ([place]) 11/27/2013  . Morbid obesity ([place]) 11/16/2013  . Microalbuminuria 08/06/2013  . Breath shortness 08/02/2013  . Acid reflux 06/16/2013  . Benign hypertension 06/16/2013  . Type 2 diabetes mellitus ([place]) 06/16/2013  . Adult hypothyroidism 06/16/2013  . Obstructive apnea 06/16/2013  . Hyperlipidemia associated with type 2 diabetes mellitus ([place]) 06/16/2013  . Oral lichen planus 89/[birth year]/1941  . Lichen planus 74/08/1446    Past Surgical History:  Procedure Laterality Date  . CATARACT EXTRACTION Bilateral   . ESOPHAGOGASTRODUODENOSCOPY (EGD) WITH PROPOFOL N/A 06/08/2015   Procedure: ESOPHAGOGASTRODUODENOSCOPY (EGD) WITH PROPOFOL;  Surgeon: Hulen Luster, MD;  [facility]  ENDOSCOPY;  Service: Gastroenterology;  Laterality: N/A;  . HEMORRHOID SURGERY    . IMPLANTABLE CONTACT LENS IMPLANTATION    . TONSILLECTOMY    . TUBAL LIGATION      Prior to Admission medications   Medication Sig Start Date End Date Taking? Authorizing Provider  acetaminophen (TYLENOL) 500 MG tablet Take 500 mg by mouth every 6 (six) hours as needed for moderate pain.   Yes [provider]  clobetasol ointment (TEMOVATE) 0.05 % Apply as directed to affected areas groin twice a day. 11/19/19  Yes Brendolyn Patty, MD  fluconazole (DIFLUCAN) 200 MG tablet Take 1 tablet (200 mg total) by mouth daily. 11/19/19 12/19/19 Yes Brendolyn Patty, MD  pravastatin (PRAVACHOL) 80 MG tablet Take 80 mg by mouth daily.  04/20/15  Yes [provider]  azelastine (ASTELIN) 0.1 % nasal spray Place into the nose. Patient not taking: Reported on 08/12/2019    [provider]  Biotin w/ Vitamins C & E (HAIR/SKIN/NAILS PO) Take 1 tablet by mouth daily. Patient not taking: Reported on 08/12/2019    [provider]  buPROPion (WELLBUTRIN XL) 150 MG 24 hr tablet Take 3 tablet po QD as directed    [provider]  Cholecalciferol (VITAMIN D3) 2000 UNITS capsule Take by mouth. Patient not taking: Reported on 08/12/2019    [provider]  ferrous sulfate 325 (65 FE) MG tablet Take 325 mg by mouth daily with breakfast.    [provider]  hydrocortisone cream 1 % Apply 1 application topically as needed for itching.    [provider]  insulin NPH-regular Human (NOVOLIN 70/30) (70-30) 100 UNIT/ML injection Inject 16 units before breakfast and 26 units before supper as directed 11/13/13   [provider]  insulin NPH-regular Human (NOVOLIN 70/30) (70-30) 100 UNIT/ML injection INJECT 30 UNITS SUBCUTANEOUSLY TWICE DAILY BEFORE A MEAL 11/15/18   [provider]  Insulin Syringe-Needle U-100 (INSULIN SYRINGE 1CC/31GX5/16") 31G X 5/16" 1 ML MISC USE ONE SYRINGE TWICE DAILY AS DIRECTED WITH INSULIN VIALS 05/26/14   [provider]  Iodoquinol-HC-Aloe Polysacch (ALCORTIN A) 1-2-1 % GEL Apply 1 application topically at bedtime. 11/11/16   [provider]  levothyroxine (SYNTHROID, LEVOTHROID) 75 MCG tablet TAKE ONE TABLET BY MOUTH ONCE DAILY 05/21/14   [provider]  lisinopril (ZESTRIL) 5 MG tablet  03/30/19   [provider]  Multiple Vitamins-Minerals (HAIR SKIN AND NAILS FORMULA PO) Take by mouth. Patient not taking: Reported on 08/12/2019    [provider]  neomycin-bacitracin-polymyxin (NEOSPORIN) ointment Apply 1 application topically as needed for wound care.    [provider]  nystatin cream (MYCOSTATIN) Apply 1 application topically as needed. 01/08/14   [provider]  pantoprazole (PROTONIX) 40 MG tablet Take 40 mg by mouth daily.  Patient not taking: Reported on 08/12/2019 07/07/14   [provider]  PARoxetine (PAXIL) 40 MG tablet Take 40 mg by mouth every morning.     [provider]  pioglitazone (ACTOS) 30 MG tablet  03/18/19   [provider]  TEA TREE OIL EX Apply topically as needed.    [provider]  torsemide (DEMADEX) 10 MG tablet Take 10 mg by mouth daily.  04/03/19   [provider]  [place] as needed. [facility] antibiotic and pain relief cream    [provider]    Allergies Other, Sulfa antibiotics, Amoxicillin-pot clavulanate, and Hydrocortisone-iodoquinol  [hydrocortisone-iodoquinol]  Family History  Problem Relation Age of  Onset  . Heart disease Father   . Cancer Mother   . Kidney failure Maternal Aunt     Social History Social History   Tobacco Use  . Smoking status: Former Smoker    Quit date: 07/27/1993    Years since quitting: 26.3  . Smokeless tobacco: Never Used  . Tobacco comment: quit 20 years  Vaping Use  . Vaping Use: Never used  Substance Use Topics  . Alcohol use: No    Alcohol/week: 0.0 standard drinks  . Drug use: No    Review of Systems   Review of Systems  Constitutional: Positive for fatigue. Negative for fever.  HENT: Negative for congestion and sore throat.   Eyes: Negative for visual disturbance.  Respiratory: Negative for cough and shortness of breath.   Cardiovascular: Negative for chest pain.  Gastrointestinal: Negative for abdominal pain,  diarrhea, nausea and vomiting.  Genitourinary: Negative for flank pain.  Musculoskeletal: Positive for arthralgias and gait problem. Negative for back pain and neck pain.  Skin: Negative for rash and wound.  Neurological: Positive for weakness and light-headedness.     ____________________________________________  PHYSICAL EXAM:      VITAL SIGNS: ED Triage Vitals  Enc Vitals Group     BP 12/05/19 2126 (!) 84/63     Pulse Rate 12/05/19 2126 97     Resp 12/05/19 2126 20     Temp 12/05/19 2126 99.4 F (37.4 C)     Temp Source 12/05/19 2126 Oral     SpO2 12/05/19 2114 99 %     Weight 12/05/19 2127 125 lb (56.7 kg)     Height 12/05/19 2127 5\' 1"  (1.549 m)     Head Circumference --      Peak Flow --      Pain Score 12/05/19 2130 8     Pain Loc --      Pain Edu? --      Excl. in [place]? --      Physical Exam Vitals and nursing note reviewed.  Constitutional:      General: She is not in acute distress.    Appearance: She is well-developed.  HENT:     Head: Normocephalic and atraumatic.     Mouth/Throat:     Mouth: Mucous membranes are dry.  Eyes:     Conjunctiva/sclera: Conjunctivae normal.  Cardiovascular:     Rate and Rhythm: Normal rate and regular rhythm.     Heart sounds: Normal heart sounds. No murmur heard.  No friction rub.  Pulmonary:     Effort: Pulmonary effort is normal. No respiratory distress.     Breath sounds: Normal breath sounds. No wheezing or rales.  Abdominal:     General: There is no distension.     Palpations: Abdomen is soft.     Tenderness: There is no abdominal tenderness.  Musculoskeletal:     Cervical back: Neck supple.     Comments: Moderate midline lumbar TTP.  Skin:    General: Skin is warm.     Capillary Refill: Capillary refill takes less than 2 seconds.  Neurological:     Mental Status: She is alert and oriented to person, place, and time.     Motor: No abnormal muscle tone.     Comments: Strength 5 and 5 bilateral lower  extremities.  Normal sensation light touch.       ____________________________________________   LABS (all labs ordered are listed, but only abnormal results are displayed)  Labs Reviewed  CBC  WITH DIFFERENTIAL/PLATELET - Abnormal; Notable for the following components:      Result Value   HCT 35.1 (*)    All other components within normal limits  COMPREHENSIVE METABOLIC PANEL - Abnormal; Notable for the following components:   Sodium 129 (*)    Chloride 91 (*)    Glucose, Bld 562 (*)    BUN 31 (*)    Creatinine, Ser 1.43 (*)    GFR, Estimated 38 (*)    All other components within normal limits  URINALYSIS, COMPLETE (UACMP) WITH MICROSCOPIC - Abnormal; Notable for the following components:   Color, Urine YELLOW (*)    APPearance HAZY (*)    Glucose, UA >=500 (*)    Hgb urine dipstick SMALL (*)    Ketones, ur 20 (*)    Leukocytes,Ua MODERATE (*)    WBC, UA >50 (*)    All other components within normal limits  TROPONIN I (HIGH SENSITIVITY)    ____________________________________________  EKG: Normal sinus rhythm, ventricular 95.  PR 144, QRS 76, QTc 459.  No acute ST elevations or depressions. ________________________________________  RADIOLOGY All imaging, including plain films, CT scans, and ultrasounds, independently reviewed by me, and interpretations confirmed via formal radiology reads.  ED MD interpretation:   CT Head/C Spine: NAICA, no fx CT L Spine: L1 compression fx with height loss CXR: no acute process XR Hip left: No acute abnormality XR Pelvis: No fx   Official radiology report(s): DG Ribs Unilateral W/Chest Left  Result Date: 12/05/2019 CLINICAL DATA:  74 year old female with fall. EXAM: LEFT RIBS AND CHEST - 3+ VIEW COMPARISON:  None. FINDINGS: No focal consolidation, pleural effusion, pneumothorax. There is chronic interstitial coarsening and bronchitic changes. The cardiac silhouette is within limits. Atherosclerotic calcification of the aorta.  Osteopenia with degenerative changes of the spine. No acute osseous pathology. No displaced rib fractures. IMPRESSION: No acute cardiopulmonary process. Electronically Signed   By: Anner Crete M.D.   On: 12/05/2019 23:09   CT Head Wo Contrast  Result Date: 12/05/2019 CLINICAL DATA:  Frequent falls EXAM: CT HEAD WITHOUT CONTRAST CT CERVICAL SPINE WITHOUT CONTRAST TECHNIQUE: Multidetector CT imaging of the head and cervical spine was performed following the standard protocol without intravenous contrast. Multiplanar CT image reconstructions of the cervical spine were also generated. COMPARISON:  CT 10/27/2018, MR 03/21/2018 FINDINGS: CT HEAD FINDINGS Brain: No evidence of acute infarction, hemorrhage, hydrocephalus, extra-axial collection, visible mass lesion or mass effect. Symmetric prominence of the ventricles, cisterns and sulci compatible with moderate parenchymal volume loss. Mixed patchy and confluent areas of white matter hypoattenuation are most compatible with chronic microvascular angiopathy. Benign dural calcifications. Cerebellar tonsils are normally positioned. Midline intracranial structures are unremarkable. None Vascular: Atherosclerotic calcification of the carotid siphons and intradural vertebral arteries. No hyperdense vessel. Skull: Chronic scarring in the right frontal scalp. No acute scalp swelling or hematoma. No calvarial fracture or worrisome acute osseous lesions. Sinuses/Orbits: Paranasal sinuses and mastoid air cells are predominantly clear. Orbital structures are unremarkable aside from prior lens extractions. Other: None. CT CERVICAL SPINE FINDINGS Alignment: Cervical stabilization collar is absent at the time of examination. Mild cervical flexion noted on the scout view. Slight leftward cranial rotation is present as well. There is straightening and reversal the normal cervical lordosis which is unchanged from prior and favored to be on a degenerative basis, slightly  accentuated by positioning. No evidence of traumatic listhesis. No abnormally widened, perched or jumped facets. Normal alignment of the craniocervical and atlantoaxial articulations. Skull base  and vertebrae: No acute skull base fracture. No vertebral body fracture or height loss. Normal bone mineralization. No worrisome osseous lesions. Mild atlantodental arthrosis. Cervical spondylitic changes, as below. Soft tissues and spinal canal: No pre or paravertebral fluid or swelling. No visible canal hematoma. Disc levels: Multilevel intervertebral disc height loss with spondylitic endplate changes. Multilevel disc osteophyte complex formations are present which result in some mild canal stenoses at C3-4, C5-6. Additional levels fat partial effacement of the ventral thecal sac. Multilevel uncinate spurring and facet hypertrophic changes are present as well resulting in mild-to-moderate multilevel neural foraminal narrowing without severe foraminal impingement. Upper chest: No acute abnormality in the upper chest or imaged lung apices. Mild apical emphysematous changes. Cervical carotid atherosclerosis. Additional calcifications in the proximal great vessels. Other: Normal thyroid IMPRESSION: 1. No acute intracranial abnormality. 2. Moderate parenchymal volume loss and chronic microvascular ischemic white matter disease. 3. No acute fracture or traumatic listhesis of the cervical spine. 4. Multilevel degenerative changes of the cervical spine as described above. 5. Cervical and intracranial atherosclerosis. 6. Emphysema (ICD10-J43.9). Electronically Signed   By: Lovena Le M.D.   On: 12/05/2019 23:21   CT Cervical Spine Wo Contrast  Result Date: 12/05/2019 CLINICAL DATA:  Frequent falls EXAM: CT HEAD WITHOUT CONTRAST CT CERVICAL SPINE WITHOUT CONTRAST TECHNIQUE: Multidetector CT imaging of the head and cervical spine was performed following the standard protocol without intravenous contrast. Multiplanar CT image  reconstructions of the cervical spine were also generated. COMPARISON:  CT 10/27/2018, MR 03/21/2018 FINDINGS: CT HEAD FINDINGS Brain: No evidence of acute infarction, hemorrhage, hydrocephalus, extra-axial collection, visible mass lesion or mass effect. Symmetric prominence of the ventricles, cisterns and sulci compatible with moderate parenchymal volume loss. Mixed patchy and confluent areas of white matter hypoattenuation are most compatible with chronic microvascular angiopathy. Benign dural calcifications. Cerebellar tonsils are normally positioned. Midline intracranial structures are unremarkable. None Vascular: Atherosclerotic calcification of the carotid siphons and intradural vertebral arteries. No hyperdense vessel. Skull: Chronic scarring in the right frontal scalp. No acute scalp swelling or hematoma. No calvarial fracture or worrisome acute osseous lesions. Sinuses/Orbits: Paranasal sinuses and mastoid air cells are predominantly clear. Orbital structures are unremarkable aside from prior lens extractions. Other: None. CT CERVICAL SPINE FINDINGS Alignment: Cervical stabilization collar is absent at the time of examination. Mild cervical flexion noted on the scout view. Slight leftward cranial rotation is present as well. There is straightening and reversal the normal cervical lordosis which is unchanged from prior and favored to be on a degenerative basis, slightly accentuated by positioning. No evidence of traumatic listhesis. No abnormally widened, perched or jumped facets. Normal alignment of the craniocervical and atlantoaxial articulations. Skull base and vertebrae: No acute skull base fracture. No vertebral body fracture or height loss. Normal bone mineralization. No worrisome osseous lesions. Mild atlantodental arthrosis. Cervical spondylitic changes, as below. Soft tissues and spinal canal: No pre or paravertebral fluid or swelling. No visible canal hematoma. Disc levels: Multilevel  intervertebral disc height loss with spondylitic endplate changes. Multilevel disc osteophyte complex formations are present which result in some mild canal stenoses at C3-4, C5-6. Additional levels fat partial effacement of the ventral thecal sac. Multilevel uncinate spurring and facet hypertrophic changes are present as well resulting in mild-to-moderate multilevel neural foraminal narrowing without severe foraminal impingement. Upper chest: No acute abnormality in the upper chest or imaged lung apices. Mild apical emphysematous changes. Cervical carotid atherosclerosis. Additional calcifications in the proximal great vessels. Other: Normal thyroid IMPRESSION: 1. No acute  intracranial abnormality. 2. Moderate parenchymal volume loss and chronic microvascular ischemic white matter disease. 3. No acute fracture or traumatic listhesis of the cervical spine. 4. Multilevel degenerative changes of the cervical spine as described above. 5. Cervical and intracranial atherosclerosis. 6. Emphysema (ICD10-J43.9). Electronically Signed   By: Lovena Le M.D.   On: 12/05/2019 23:21   CT Lumbar Spine Wo Contrast  Result Date: 12/05/2019 CLINICAL DATA:  Back pain after fall EXAM: CT LUMBAR SPINE WITHOUT CONTRAST TECHNIQUE: Multidetector CT imaging of the lumbar spine was performed without intravenous contrast administration. Multiplanar CT image reconstructions were also generated. COMPARISON:  MR lumbar spine Jun 08, 2019 FINDINGS: Segmentation: There are 5 non-rib bearing lumbar type vertebral bodies with the last intervertebral disc space labeled as L5-S1. Alignment: There is a mild leftward curvature of the lumbar spine. There is a minimal anterolisthesis of L4 on L5 and L5 on S1. Vertebrae: There is an acute superior compression fracture of the L1 vertebral body with slight buckling of the anterior superior cortex and less than 25% loss in height. No retropulsion of fragments or extension to the posterior elements is  seen. Paraspinal and other soft tissues: The paraspinal soft tissues and visualized retroperitoneal structures are unremarkable. The sacroiliac joints are intact. Disc levels: Mild lumbar spine spondylosis most notable at L3-L4 and L4-L5 with moderate neural foraminal narrowing and mild central canal stenosis. IMPRESSION: Acute superior compression fracture of the L1 vertebral body with buckling of the anterior superior cortex and less than 25% loss in height. No retropulsion of fragments is seen. Electronically Signed   By: Prudencio Pair M.D.   On: 12/05/2019 23:19   DG HIP UNILAT WITH PELVIS 2-3 VIEWS LEFT  Result Date: 12/05/2019 CLINICAL DATA:  Pain EXAM: DG HIP (WITH OR WITHOUT PELVIS) 2-3V LEFT COMPARISON:  None. FINDINGS: There is no evidence of hip fracture or dislocation. Moderate left hip osteoarthritis is seen with superior joint space loss and marginal osteophyte formation. Scattered vascular calcifications are noted. No focal soft tissue swelling. IMPRESSION: No acute osseous abnormality. Electronically Signed   By: Prudencio Pair M.D.   On: 12/05/2019 23:09   DG HIP UNILAT WITH PELVIS 2-3 VIEWS RIGHT  Result Date: 12/05/2019 CLINICAL DATA:  Frequent falls EXAM: DG HIP (WITH OR WITHOUT PELVIS) 2-3V RIGHT COMPARISON:  None. FINDINGS: Hip joints and SI joints symmetric. No acute bony abnormality. Specifically, no fracture, subluxation, or dislocation. IMPRESSION: No acute bony abnormality. Electronically Signed   By: Rolm Baptise M.D.   On: 12/05/2019 23:09    ____________________________________________  PROCEDURES   Procedure(s) performed (including Critical Care):  Procedures  ____________________________________________  INITIAL IMPRESSION / MDM / [place] / ED COURSE  As part of my medical decision making, I reviewed the following data within the [place] notes reviewed and incorporated, Old chart reviewed, Notes from prior ED visits, and  [place] Controlled Substance Database       *NAZARETH NORENBERG was evaluated in Emergency Department on 12/05/2019 for the symptoms described in the history of present illness. She was evaluated in the context of the global COVID-19 pandemic, which necessitated consideration that the patient might be at risk for infection with the SARS-CoV-2 virus that causes COVID-19. Institutional protocols and algorithms that pertain to the evaluation of patients at risk for COVID-19 are in a state of rapid change based on information released by regulatory bodies including the CDC and federal and state organizations. These policies and algorithms were followed during  the patient's care in the ED.  Some ED evaluations and interventions may be delayed as a result of limited staffing during the pandemic.*     Medical Decision Making: 74 year old female here with generalized weakness and recurrent falls.  Clinically, she appears dehydrated and generally weak.  Lab work shows marked hyperglycemia and likely hypovolemic hyponatremia as well as prerenal AKI.  Will give fluids, insulin, and monitor.  She has a normal bicarb and anion gap, making DKA unlikely.  Urinalysis reviewed, shows likely UTI as well as dehydration.  Will give her Rocephin in addition to hydration.  Regarding her falls, CT imaging and plain films reviewed by me, summarized above.  Imaging most notable for an L1 compression fracture without retropulsion.  Discussed with Dr. Cari Caraway of neurosurgery, who will see the patient as an inpatient consult, recommends TLSO brace.  I have ordered this.  Will admit to hospitalist.  ____________________________________________  FINAL CLINICAL IMPRESSION(S) / ED DIAGNOSES  Final diagnoses:  Fall, initial encounter  Hyperglycemia  Dehydration  Compression fracture of L1 vertebra, initial encounter ([place])     MEDICATIONS GIVEN DURING THIS VISIT:  Medications  sodium chloride 0.9 % bolus 1,000 mL (has no  administration in time range)  cefTRIAXone (ROCEPHIN) 1 g in sodium chloride 0.9 % 100 mL IVPB (has no administration in time range)  insulin aspart (novoLOG) injection 5 Units (has no administration in time range)  acetaminophen (TYLENOL) tablet 1,000 mg (has no administration in time range)  fentaNYL (SUBLIMAZE) injection 25 mcg (25 mcg Intravenous Refused 12/05/19 2334)  0.9 %  sodium chloride infusion (has no administration in time range)     ED Discharge Orders    None       Note:  This document was prepared using Dragon voice recognition software and may include unintentional dictation errors.   Duffy Bruce, MD 12/05/19 2350

## 2019-12-06 DIAGNOSIS — S32000A Wedge compression fracture of unspecified lumbar vertebra, initial encounter for closed fracture: Secondary | ICD-10-CM

## 2019-12-06 DIAGNOSIS — E871 Hypo-osmolality and hyponatremia: Secondary | ICD-10-CM | POA: Diagnosis present

## 2019-12-06 LAB — BASIC METABOLIC PANEL
Anion gap: 12 (ref 5–15)
BUN: 29 mg/dL — ABNORMAL HIGH (ref 8–23)
CO2: 24 mmol/L (ref 22–32)
Calcium: 9.5 mg/dL (ref 8.9–10.3)
Chloride: 98 mmol/L (ref 98–111)
Creatinine, Ser: 1.32 mg/dL — ABNORMAL HIGH (ref 0.44–1.00)
GFR, Estimated: 42 mL/min — ABNORMAL LOW (ref >60)
Glucose, Bld: 238 mg/dL — ABNORMAL HIGH (ref 70–99)
Potassium: 3.7 mmol/L (ref 3.5–5.1)
Sodium: 134 mmol/L — ABNORMAL LOW (ref 135–145)

## 2019-12-06 LAB — RESP PANEL BY RT-PCR (FLU A&B, COVID) ARPGX2
Influenza A by PCR: NEGATIVE
Influenza B by PCR: NEGATIVE
SARS Coronavirus 2 by RT PCR: NEGATIVE

## 2019-12-06 LAB — GLUCOSE, CAPILLARY
Glucose-Capillary: 234 mg/dL — ABNORMAL HIGH (ref 70–99)
Glucose-Capillary: 303 mg/dL — ABNORMAL HIGH (ref 70–99)
Glucose-Capillary: 140 mg/dL — ABNORMAL HIGH (ref 70–99)
Glucose-Capillary: 313 mg/dL — ABNORMAL HIGH (ref 70–99)

## 2019-12-06 LAB — HEMOGLOBIN A1C
Hgb A1c MFr Bld: 10.6 % — ABNORMAL HIGH (ref 4.8–5.6)
Mean Plasma Glucose: 257.52 mg/dL

## 2019-12-06 LAB — TROPONIN I (HIGH SENSITIVITY): Troponin I (High Sensitivity): 14 ng/L (ref <18)

## 2019-12-06 MED ORDER — OXYCODONE HCL 5 MG PO TABS: 5.0000 mg | ORAL_TABLET | Freq: Four times a day (QID) | ORAL | Status: DC | PRN

## 2019-12-06 NOTE — Consult Note (Signed)
Referring Physician:  No referring provider defined for this encounter.  Primary Physician:  Kirk Ruths, MD  Chief Complaint:  fall  History of Present Illness: 12/06/2019 Abigail Walker is a 74 y.o. female who presents with the chief complaint of fall and back pain.  She has had multiple falls in the last 4 days, and comes today with worse pain than prior.  She has mid back pain.   She has no new weakness or numbness. She denies bowel or bladder dysfunction.  She was found to have an L1 fracture prompting consultation.   Review of Systems:  A 10 point review of systems is negative, except for the pertinent positives and negatives detailed in the HPI.  Past Medical History: Past Medical History:  Diagnosis Date  . Acid reflux 06/16/2013  . Adult hypothyroidism 06/16/2013   Last Assessment & Plan:  Tsh and energy have been stable.    . Benign hypertension 06/16/2013   Last Assessment & Plan:  Patients blood pressure has seemingly been controlled without significant side effects such as dizziness or slow heart rate.     . Cancer ([place]) skin  . Sherran Needs syndrome   . Chronic kidney disease   . Depression   . Diabetes mellitus without complication ([place])   . GERD (gastroesophageal reflux disease)   . HBV (hepatitis B virus) infection   . High blood pressure   . Hives    food related  . Hypothyroidism   . Lichen planus   . Mixed incontinence   . Obstructive apnea 06/16/2013  . Sleep apnea   . Thyroid disease     Past Surgical History: Past Surgical History:  Procedure Laterality Date  . CATARACT EXTRACTION Bilateral   . ESOPHAGOGASTRODUODENOSCOPY (EGD) WITH PROPOFOL N/A 06/08/2015   Procedure: ESOPHAGOGASTRODUODENOSCOPY (EGD) WITH PROPOFOL;  Surgeon: Hulen Luster, MD;  Location: [facility] ENDOSCOPY;  Service: Gastroenterology;  Laterality: N/A;  . HEMORRHOID SURGERY    . IMPLANTABLE CONTACT LENS IMPLANTATION    . TONSILLECTOMY    . TUBAL LIGATION       Allergies: Allergies as of 12/05/2019 - Review Complete 12/05/2019  Allergen Reaction Noted  . Other  11/08/2010  . Sulfa antibiotics Nausea And Vomiting 07/28/2014  . Amoxicillin-pot clavulanate Rash 07/28/2014  . Hydrocortisone-iodoquinol  [hydrocortisone-iodoquinol] Rash 03/16/2017    Medications:  Current Facility-Administered Medications:  .  0.9 %  sodium chloride infusion, , Intravenous, Once, Duffy Bruce, MD .  0.9 %  sodium chloride infusion, , Intravenous, Continuous, Agbata, Tochukwu, MD, Last Rate: 150 mL/hr at 12/06/19 0354, New Bag at 12/06/19 0354 .  acetaminophen (TYLENOL) tablet 500 mg, 500 mg, Oral, Q6H PRN, Agbata, Tochukwu, MD .  buPROPion (WELLBUTRIN XL) 24 hr tablet 150 mg, 150 mg, Oral, Daily, Agbata, Tochukwu, MD .  Derrill Memo ON 12/07/2019] cefTRIAXone (ROCEPHIN) 1 g in sodium chloride 0.9 % 100 mL IVPB, 1 g, Intravenous, Q24H, Agbata, Tochukwu, MD .  enoxaparin (LOVENOX) injection 40 mg, 40 mg, Subcutaneous, Q24H, Agbata, Tochukwu, MD .  fentaNYL (SUBLIMAZE) injection 25 mcg, 25 mcg, Intravenous, Once, Duffy Bruce, MD .  ferrous sulfate tablet 325 mg, 325 mg, Oral, Q breakfast, Agbata, Tochukwu, MD .  fluconazole (DIFLUCAN) tablet 200 mg, 200 mg, Oral, Daily, Agbata, Tochukwu, MD .  insulin aspart (novoLOG) injection 0-15 Units, 0-15 Units, Subcutaneous, TID WC, Agbata, Tochukwu, MD .  insulin detemir (LEVEMIR) injection 20 Units, 20 Units, Subcutaneous, QHS, Agbata, Tochukwu, MD .  levothyroxine (SYNTHROID) tablet 75 mcg, 75 mcg,  Oral, [id number], Collier Bullock, MD, 75 mcg at 12/06/19 0624 .  lisinopril (ZESTRIL) tablet 5 mg, 5 mg, Oral, Daily, Agbata, Tochukwu, MD .  oxyCODONE (Oxy IR/ROXICODONE) immediate release tablet 5 mg, 5 mg, Oral, Q6H PRN, Agbata, Tochukwu, MD .  pantoprazole (PROTONIX) EC tablet 40 mg, 40 mg, Oral, Daily, Agbata, Tochukwu, MD .  PARoxetine (PAXIL) tablet 40 mg, 40 mg, Oral, [id number], Agbata, Tochukwu, MD, 40 mg at 12/06/19 0625 .   pravastatin (PRAVACHOL) tablet 80 mg, 80 mg, Oral, Daily, Agbata, Tochukwu, MD .  torsemide (DEMADEX) tablet 10 mg, 10 mg, Oral, Daily, Agbata, Tochukwu, MD   Social History: Social History   Tobacco Use  . Smoking status: Former Smoker    Quit date: 07/27/1993    Years since quitting: 26.3  . Smokeless tobacco: Never Used  . Tobacco comment: quit 20 years  Vaping Use  . Vaping Use: Never used  Substance Use Topics  . Alcohol use: No    Alcohol/week: 0.0 standard drinks  . Drug use: No    Family Medical History: Family History  Problem Relation Age of Onset  . Heart disease Father   . Cancer Mother   . Kidney failure Maternal Aunt     Physical Examination: Vitals:   12/06/19 0125 12/06/19 0521  BP: 136/63 (!) 118/48  Pulse: 92 86  Resp: 20 16  Temp: 98.4 F (36.9 C) 98 F (36.7 C)  SpO2: 99% 99%     General: Patient is well developed, well nourished, calm, collected, and in no apparent distress.  Psychiatric: Patient is non-anxious.  Head:  Pupils equal, round, and reactive to light.  ENT:  Oral mucosa appears well hydrated.  Neck:   Supple.  Full range of motion.  Respiratory: Patient is breathing without any difficulty.  Extremities: No significant edema.  Vascular: Palpable pulses in dorsal pedal vessels.  Skin:   On exposed skin, there are minor abnormal skin lesions.  NEUROLOGICAL:  General: In no acute distress.   Awake, alert, oriented to person, place, and time.  Pupils equal round and reactive to light.  Facial tone is symmetric.  Tongue protrusion is midline.  There is no pronator drift.  ROM of spine: untested  Strength: Side Biceps Triceps Deltoid Interossei Grip Wrist Ext. Wrist Flex.  R 5 5 5 5 5 5 5   L 5 5 5 5 5 5 5    Side Iliopsoas Quads Hamstring PF DF EHL  R 5 5 5 5 5 5   L 5 5 5 5 5 5    Reflexes are 1+ and symmetric at the biceps, triceps, brachioradialis, patella and achilles.   Bilateral upper and lower extremity sensation  is intact to light touch and pin prick.  Clonus is not present.  Toes are down-going.  Gait is untested.  Imaging: CT L spine 12/05/2019 IMPRESSION: Acute superior compression fracture of the L1 vertebral body with buckling of the anterior superior cortex and less than 25% loss in height. No retropulsion of fragments is seen.   Electronically Signed   By: Prudencio Pair M.D.   On: 12/05/2019 23:19  I have personally reviewed the images and agree with the above interpretation.  Labs: CBC Latest Ref Rng & Units 12/05/2019 01/09/2019 10/27/2018  WBC 4.0 - 10.5 K/uL 4.9 3.6(L) 4.7  Hemoglobin 12.0 - 15.0 g/dL 12.4 12.5 12.4  Hematocrit 36 - 46 % 35.1(L) 37.7 37.2  Platelets 150 - 400 K/uL 215 160 143(L)       Assessment and  Plan: Abigail Walker is a pleasant 74 y.o. female with stable L1 fracture.  - no surgical intervention - Brace ordered - ambulate with brace. OK to remove when in bed - pain control and dispo per primary team - we will arrange follow up  [place]. Izora Ribas MD, [facility]

## 2019-12-06 NOTE — Progress Notes (Signed)
PROGRESS NOTE    Abigail Walker  UJW:[id number] DOB: [birth year] DOA: 12/05/2019 PCP: Kirk Ruths, MD   Assessment & Plan:   Principal Problem:   Hyperglycemia due to diabetes mellitus [facility]) Active Problems:   Benign hypertension   Adult hypothyroidism   Stage 3 chronic kidney disease ([place])   Acute lower UTI   Depression   Lumbar compression fracture (HCC)   Hyponatremia   DM2: poorly controlled, HbA1c is 10.6. Hx of medication non-compliance. Continue on IV levemir, SSI w/ accuchecks. DM coordinator consulted   CKDIIIb: Cr is trending down from day prior. Avoid nephrotoxic meds   Depression: severity unknown. Continue on home dose of bupropion, paxil   Hypothyroidism: continue on home dose of synthroid   HTN: continue on home dose of lisinopril   Frequent falls: with lumbar compression fracture. Continue w/ TLSO brace. Fall precautions. Will f/u w/ neurosurg as an outpatient    UTI: UA is positive, urine cx is pending. Continue on IV rocephin   Hyponatremia: likely secondary to hyperglycemia. Na level is trending up    DVT prophylaxis: lovenox  Code Status: full  Family Communication:  Disposition Plan: depends on PT/OT recs  Status is: Inpatient  Remains inpatient appropriate because:Ongoing diagnostic testing needed not appropriate for outpatient work up, Unsafe d/c plan and IV treatments appropriate due to intensity of illness or inability to take PO   Dispo: The patient is from: Home              Anticipated d/c is to: Home vs SNF              Anticipated d/c date is: 2 days              Patient currently is not medically stable to d/c.     Consultants:  neurosurg  Procedures:   Antimicrobials: rocephin   Subjective: Pt c/o back pain   Objective: Vitals:   12/05/19 2231 12/06/19 0048 12/06/19 0125 12/06/19 0521  BP: (!) 121/55 109/62 136/63 (!) 118/48  Pulse: 88 85 92 86  Resp: 20 18 20 16   Temp:   98.4 F (36.9 C) 98 F (36.7  C)  TempSrc:   Oral Oral  SpO2: 100% 99% 99% 99%  Weight:   57.2 kg   Height:   5\' 1"  (1.549 m)    No intake or output data in the 24 hours ending 12/06/19 0755 Filed Weights   12/05/19 2127 12/06/19 0125  Weight: 56.7 kg 57.2 kg    Examination:  General exam: Appears calm and comfortable. Frail appearing  Respiratory system: Clear to auscultation. Respiratory effort normal. Cardiovascular system: S1 & S2 +. No  rubs, gallops or clicks. No pedal edema. Gastrointestinal system: Abdomen is nondistended, soft and nontender. Normal bowel sounds heard. Central nervous system: Alert and oriented. Moves all 4 extremities  Psychiatry: Judgement and insight appear normal. Mood & affect appropriate.     Data Reviewed: I have personally reviewed following labs and imaging studies  CBC: Recent Labs  Lab 12/05/19 2231  WBC 4.9  NEUTROABS 2.3  HGB 12.4  HCT 35.1*  MCV 86.2  PLT 562   Basic Metabolic Panel: Recent Labs  Lab 12/05/19 2231 12/06/19 0522  NA 129* 134*  K 4.7 3.7  CL 91* 98  CO2 26 24  GLUCOSE 562* 238*  BUN 31* 29*  CREATININE 1.43* 1.32*  CALCIUM 9.5 9.5   GFR: Estimated Creatinine Clearance: 27.8 mL/min (A) (by C-G formula based on  SCr of 1.32 mg/dL (H)). Liver Function Tests: Recent Labs  Lab 12/05/19 2231  AST 20  ALT 15  ALKPHOS 104  BILITOT 0.9  PROT 7.0  ALBUMIN 3.6   No results for input(s): LIPASE, AMYLASE in the last 168 hours. No results for input(s): AMMONIA in the last 168 hours. Coagulation Profile: No results for input(s): INR, PROTIME in the last 168 hours. Cardiac Enzymes: No results for input(s): CKTOTAL, CKMB, CKMBINDEX, TROPONINI in the last 168 hours. BNP (last 3 results) No results for input(s): PROBNP in the last 8760 hours. HbA1C: No results for input(s): HGBA1C in the last 72 hours. CBG: Recent Labs  Lab 12/05/19 2358 12/06/19 0723  GLUCAP 315* 234*   Lipid Profile: No results for input(s): CHOL, HDL, LDLCALC,  TRIG, CHOLHDL, LDLDIRECT in the last 72 hours. Thyroid Function Tests: No results for input(s): TSH, T4TOTAL, FREET4, T3FREE, THYROIDAB in the last 72 hours. Anemia Panel: No results for input(s): VITAMINB12, FOLATE, FERRITIN, TIBC, IRON, RETICCTPCT in the last 72 hours. Sepsis Labs: No results for input(s): PROCALCITON, LATICACIDVEN in the last 168 hours.  Recent Results (from the past 240 hour(s))  Resp Panel by RT-PCR (Flu A&B, Covid) Nasopharyngeal Swab     Status: None   Collection Time: 12/06/19 12:20 AM   Specimen: Nasopharyngeal Swab; Nasopharyngeal(NP) swabs in vial transport medium  Result Value Ref Range Status   SARS Coronavirus 2 by RT PCR NEGATIVE NEGATIVE Final    Comment: (NOTE) SARS-CoV-2 target nucleic acids are NOT DETECTED.  The SARS-CoV-2 RNA is generally detectable in upper respiratory specimens during the acute phase of infection. The lowest concentration of SARS-CoV-2 viral copies this assay can detect is 138 copies/mL. A negative result does not preclude SARS-Cov-2 infection and should not be used as the sole basis for treatment or other patient management decisions. A negative result may occur with  improper specimen collection/handling, submission of specimen other than nasopharyngeal swab, presence of viral mutation(s) within the areas targeted by this assay, and inadequate number of viral copies(<138 copies/mL). A negative result must be combined with clinical observations, patient history, and epidemiological information. The expected result is Negative.  Fact Sheet for Patients:  EntrepreneurPulse.com.au  Fact Sheet for Healthcare Providers:  IncredibleEmployment.be  This test is no t yet approved or cleared by the [place] FDA and  has been authorized for detection and/or diagnosis of SARS-CoV-2 by FDA under an Emergency Use Authorization (EUA). This EUA will remain  in effect (meaning this test can be  used) for the duration of the COVID-19 declaration under Section 564(b)(1) of the Act, 21 U.S.C.section 360bbb-3(b)(1), unless the authorization is terminated  or revoked sooner.       Influenza A by PCR NEGATIVE NEGATIVE Final   Influenza B by PCR NEGATIVE NEGATIVE Final    Comment: (NOTE) The Xpert Xpress SARS-CoV-2/FLU/RSV plus assay is intended as an aid in the diagnosis of influenza from Nasopharyngeal swab specimens and should not be used as a sole basis for treatment. Nasal washings and aspirates are unacceptable for Xpert Xpress SARS-CoV-2/FLU/RSV testing.  Fact Sheet for Patients: EntrepreneurPulse.com.au  Fact Sheet for Healthcare Providers: IncredibleEmployment.be  This test is not yet approved or cleared by the [place] FDA and has been authorized for detection and/or diagnosis of SARS-CoV-2 by FDA under an Emergency Use Authorization (EUA). This EUA will remain in effect (meaning this test can be used) for the duration of the COVID-19 declaration under Section 564(b)(1) of the Act, 21 U.S.C. section 360bbb-3(b)(1), unless  the authorization is terminated or revoked.  Performed at [facility], [address], [place], [place] [address]          Radiology Studies: DG Ribs Unilateral W/Chest Left  Result Date: 12/05/2019 CLINICAL DATA:  74 year old female with fall. EXAM: LEFT RIBS AND CHEST - 3+ VIEW COMPARISON:  None. FINDINGS: No focal consolidation, pleural effusion, pneumothorax. There is chronic interstitial coarsening and bronchitic changes. The cardiac silhouette is within limits. Atherosclerotic calcification of the aorta. Osteopenia with degenerative changes of the spine. No acute osseous pathology. No displaced rib fractures. IMPRESSION: No acute cardiopulmonary process. Electronically Signed   By: Anner Crete M.D.   On: 12/05/2019 23:09   CT Head Wo Contrast  Result Date: 12/05/2019 CLINICAL  DATA:  Frequent falls EXAM: CT HEAD WITHOUT CONTRAST CT CERVICAL SPINE WITHOUT CONTRAST TECHNIQUE: Multidetector CT imaging of the head and cervical spine was performed following the standard protocol without intravenous contrast. Multiplanar CT image reconstructions of the cervical spine were also generated. COMPARISON:  CT 10/27/2018, MR 03/21/2018 FINDINGS: CT HEAD FINDINGS Brain: No evidence of acute infarction, hemorrhage, hydrocephalus, extra-axial collection, visible mass lesion or mass effect. Symmetric prominence of the ventricles, cisterns and sulci compatible with moderate parenchymal volume loss. Mixed patchy and confluent areas of white matter hypoattenuation are most compatible with chronic microvascular angiopathy. Benign dural calcifications. Cerebellar tonsils are normally positioned. Midline intracranial structures are unremarkable. None Vascular: Atherosclerotic calcification of the carotid siphons and intradural vertebral arteries. No hyperdense vessel. Skull: Chronic scarring in the right frontal scalp. No acute scalp swelling or hematoma. No calvarial fracture or worrisome acute osseous lesions. Sinuses/Orbits: Paranasal sinuses and mastoid air cells are predominantly clear. Orbital structures are unremarkable aside from prior lens extractions. Other: None. CT CERVICAL SPINE FINDINGS Alignment: Cervical stabilization collar is absent at the time of examination. Mild cervical flexion noted on the scout view. Slight leftward cranial rotation is present as well. There is straightening and reversal the normal cervical lordosis which is unchanged from prior and favored to be on a degenerative basis, slightly accentuated by positioning. No evidence of traumatic listhesis. No abnormally widened, perched or jumped facets. Normal alignment of the craniocervical and atlantoaxial articulations. Skull base and vertebrae: No acute skull base fracture. No vertebral body fracture or height loss. Normal bone  mineralization. No worrisome osseous lesions. Mild atlantodental arthrosis. Cervical spondylitic changes, as below. Soft tissues and spinal canal: No pre or paravertebral fluid or swelling. No visible canal hematoma. Disc levels: Multilevel intervertebral disc height loss with spondylitic endplate changes. Multilevel disc osteophyte complex formations are present which result in some mild canal stenoses at C3-4, C5-6. Additional levels fat partial effacement of the ventral thecal sac. Multilevel uncinate spurring and facet hypertrophic changes are present as well resulting in mild-to-moderate multilevel neural foraminal narrowing without severe foraminal impingement. Upper chest: No acute abnormality in the upper chest or imaged lung apices. Mild apical emphysematous changes. Cervical carotid atherosclerosis. Additional calcifications in the proximal great vessels. Other: Normal thyroid IMPRESSION: 1. No acute intracranial abnormality. 2. Moderate parenchymal volume loss and chronic microvascular ischemic white matter disease. 3. No acute fracture or traumatic listhesis of the cervical spine. 4. Multilevel degenerative changes of the cervical spine as described above. 5. Cervical and intracranial atherosclerosis. 6. Emphysema (ICD10-J43.9). Electronically Signed   By: Lovena Le M.D.   On: 12/05/2019 23:21   CT Cervical Spine Wo Contrast  Result Date: 12/05/2019 CLINICAL DATA:  Frequent falls EXAM: CT HEAD WITHOUT CONTRAST CT CERVICAL  SPINE WITHOUT CONTRAST TECHNIQUE: Multidetector CT imaging of the head and cervical spine was performed following the standard protocol without intravenous contrast. Multiplanar CT image reconstructions of the cervical spine were also generated. COMPARISON:  CT 10/27/2018, MR 03/21/2018 FINDINGS: CT HEAD FINDINGS Brain: No evidence of acute infarction, hemorrhage, hydrocephalus, extra-axial collection, visible mass lesion or mass effect. Symmetric prominence of the ventricles,  cisterns and sulci compatible with moderate parenchymal volume loss. Mixed patchy and confluent areas of white matter hypoattenuation are most compatible with chronic microvascular angiopathy. Benign dural calcifications. Cerebellar tonsils are normally positioned. Midline intracranial structures are unremarkable. None Vascular: Atherosclerotic calcification of the carotid siphons and intradural vertebral arteries. No hyperdense vessel. Skull: Chronic scarring in the right frontal scalp. No acute scalp swelling or hematoma. No calvarial fracture or worrisome acute osseous lesions. Sinuses/Orbits: Paranasal sinuses and mastoid air cells are predominantly clear. Orbital structures are unremarkable aside from prior lens extractions. Other: None. CT CERVICAL SPINE FINDINGS Alignment: Cervical stabilization collar is absent at the time of examination. Mild cervical flexion noted on the scout view. Slight leftward cranial rotation is present as well. There is straightening and reversal the normal cervical lordosis which is unchanged from prior and favored to be on a degenerative basis, slightly accentuated by positioning. No evidence of traumatic listhesis. No abnormally widened, perched or jumped facets. Normal alignment of the craniocervical and atlantoaxial articulations. Skull base and vertebrae: No acute skull base fracture. No vertebral body fracture or height loss. Normal bone mineralization. No worrisome osseous lesions. Mild atlantodental arthrosis. Cervical spondylitic changes, as below. Soft tissues and spinal canal: No pre or paravertebral fluid or swelling. No visible canal hematoma. Disc levels: Multilevel intervertebral disc height loss with spondylitic endplate changes. Multilevel disc osteophyte complex formations are present which result in some mild canal stenoses at C3-4, C5-6. Additional levels fat partial effacement of the ventral thecal sac. Multilevel uncinate spurring and facet hypertrophic  changes are present as well resulting in mild-to-moderate multilevel neural foraminal narrowing without severe foraminal impingement. Upper chest: No acute abnormality in the upper chest or imaged lung apices. Mild apical emphysematous changes. Cervical carotid atherosclerosis. Additional calcifications in the proximal great vessels. Other: Normal thyroid IMPRESSION: 1. No acute intracranial abnormality. 2. Moderate parenchymal volume loss and chronic microvascular ischemic white matter disease. 3. No acute fracture or traumatic listhesis of the cervical spine. 4. Multilevel degenerative changes of the cervical spine as described above. 5. Cervical and intracranial atherosclerosis. 6. Emphysema (ICD10-J43.9). Electronically Signed   By: Lovena Le M.D.   On: 12/05/2019 23:21   CT Thoracic Spine Wo Contrast  Result Date: 12/06/2019 CLINICAL DATA:  Frequent falls EXAM: CT THORACIC SPINE WITHOUT CONTRAST TECHNIQUE: Multidetector CT images of the thoracic were obtained using the standard protocol without intravenous contrast. COMPARISON:  Contemporary lumbar spine radiographs FINDINGS: Segmentation: 12 rib-bearing thoracic type vertebral levels. Alignment: Mild exaggeration of the normal thoracic kyphosis. No abnormally widened, perched or jumped facets. Vertebrae: The osseous structures appear diffusely demineralized which may limit detection of small or nondisplaced fractures. Slightly sclerotic endplate changes and central cuffing in the inferior endplate T12 with a more lucent Schmorl's node formation and intervertebral extension of nitrogenous gas from the disc space may reflect an acute to subacute Schmorl's node formation rather than true compression deformity. No other acute thoracic spine fracture or vertebral body height loss is seen. Posterior elements and transverse processes are intact. Redemonstration of an incomplete L1 burst fracture with up to 25% height loss, as described on  dedicated lumbar  spine imaging. Multilevel discogenic changes and more mild facet degenerative change are noted throughout the thoracic spine, better detailed below. Paraspinal and other soft tissues: Paravertebral thickening adjacent the T12-L1 disc space likely related to the acute compression deformity. No other visible perispinal fluid, swelling, gas or hemorrhage. No visible canal hematoma. Included portions of the chest and upper abdomen demonstrate extensive aortic atherosclerosis and calcification throughout the branch vessels. Three-vessel coronary artery atherosclerosis noted as well. 3 mm solid nodule is seen in the anterosuperior segment right upper lobe (2/33). No other acute or worrisome abnormality in the included portions of the posterior chest and abdomen. Disc levels: Diffuse intervertebral disc height loss with multilevel desiccation and vascular disc, endplate sclerosis and anterior predominant spurring with multilevel Schmorl's node formation including the more acute to subacute appearing formation in the inferior endplate T12. Despite these findings, there is no significant spinal canal stenosis or neural foraminal narrowing within the thoracic levels. IMPRESSION: 1. L1 compression deformity, better detailed dedicated lumbar imaging. 2. Slightly sclerotic endplate change and central cupping in the inferior endplate T12 with a rounded lucency and extension of nitrogenous gas from the disc space is favored to reflect an acute to subacute Schmorl's node formation rather than true compression deformity. 3. No other acute thoracic spine fracture or vertebral body height loss. 4. Multilevel degenerative changes of the thoracic spine without significant spinal canal stenosis or neural foraminal narrowing within the thoracic levels. 5. 3 mm solid nodule in the anterosuperior segment right upper lobe. No follow-up needed if patient is low-risk. Non-contrast chest CT can be considered in 12 months if patient is  high-risk. This recommendation follows the consensus statement: Guidelines for Management of Incidental Pulmonary Nodules Detected on CT Images: From the Fleischner Society 2017; Radiology 2017; 284:228-243. 6. Three-vessel coronary artery atherosclerosis. 7. Aortic Atherosclerosis (ICD10-I70.0). Electronically Signed   By: Lovena Le M.D.   On: 12/06/2019 00:01   CT Lumbar Spine Wo Contrast  Result Date: 12/05/2019 CLINICAL DATA:  Back pain after fall EXAM: CT LUMBAR SPINE WITHOUT CONTRAST TECHNIQUE: Multidetector CT imaging of the lumbar spine was performed without intravenous contrast administration. Multiplanar CT image reconstructions were also generated. COMPARISON:  MR lumbar spine Jun 08, 2019 FINDINGS: Segmentation: There are 5 non-rib bearing lumbar type vertebral bodies with the last intervertebral disc space labeled as L5-S1. Alignment: There is a mild leftward curvature of the lumbar spine. There is a minimal anterolisthesis of L4 on L5 and L5 on S1. Vertebrae: There is an acute superior compression fracture of the L1 vertebral body with slight buckling of the anterior superior cortex and less than 25% loss in height. No retropulsion of fragments or extension to the posterior elements is seen. Paraspinal and other soft tissues: The paraspinal soft tissues and visualized retroperitoneal structures are unremarkable. The sacroiliac joints are intact. Disc levels: Mild lumbar spine spondylosis most notable at L3-L4 and L4-L5 with moderate neural foraminal narrowing and mild central canal stenosis. IMPRESSION: Acute superior compression fracture of the L1 vertebral body with buckling of the anterior superior cortex and less than 25% loss in height. No retropulsion of fragments is seen. Electronically Signed   By: Prudencio Pair M.D.   On: 12/05/2019 23:19   DG HIP UNILAT WITH PELVIS 2-3 VIEWS LEFT  Result Date: 12/05/2019 CLINICAL DATA:  Pain EXAM: DG HIP (WITH OR WITHOUT PELVIS) 2-3V LEFT  COMPARISON:  None. FINDINGS: There is no evidence of hip fracture or dislocation. Moderate left hip osteoarthritis is  seen with superior joint space loss and marginal osteophyte formation. Scattered vascular calcifications are noted. No focal soft tissue swelling. IMPRESSION: No acute osseous abnormality. Electronically Signed   By: Prudencio Pair M.D.   On: 12/05/2019 23:09   DG HIP UNILAT WITH PELVIS 2-3 VIEWS RIGHT  Result Date: 12/05/2019 CLINICAL DATA:  Frequent falls EXAM: DG HIP (WITH OR WITHOUT PELVIS) 2-3V RIGHT COMPARISON:  None. FINDINGS: Hip joints and SI joints symmetric. No acute bony abnormality. Specifically, no fracture, subluxation, or dislocation. IMPRESSION: No acute bony abnormality. Electronically Signed   By: Rolm Baptise M.D.   On: 12/05/2019 23:09        Scheduled Meds: . buPROPion  150 mg Oral Daily  . enoxaparin (LOVENOX) injection  40 mg Subcutaneous Q24H  . fentaNYL (SUBLIMAZE) injection  25 mcg Intravenous Once  . ferrous sulfate  325 mg Oral Q breakfast  . fluconazole  200 mg Oral Daily  . insulin aspart  0-15 Units Subcutaneous TID WC  . insulin detemir  20 Units Subcutaneous QHS  . levothyroxine  75 mcg Oral Q0600  . lisinopril  5 mg Oral Daily  . pantoprazole  40 mg Oral Daily  . PARoxetine  40 mg Oral [id number]  . pravastatin  80 mg Oral Daily  . torsemide  10 mg Oral Daily   Continuous Infusions: . sodium chloride    . sodium chloride 150 mL/hr at 12/06/19 0354  . [START ON 12/07/2019] cefTRIAXone (ROCEPHIN)  IV       LOS: 1 day    Time spent: 34 mins     Wyvonnia Dusky, MD [facility] Pager [phone]  If 7PM-7AM, please contact night-coverage [email or web address] 12/06/2019, 7:55 AM

## 2019-12-06 NOTE — Progress Notes (Addendum)
Inpatient Diabetes Program Recommendations  AACE/ADA: New Consensus Statement on Inpatient Glycemic Control (2015)  Target Ranges:  Prepandial:   less than 140 mg/dL      Peak postprandial:   less than 180 mg/dL (1-2 hours)      Critically ill patients:  140 - 180 mg/dL   Lab Results  Component Value Date   GLUCAP 234 (H) 12/06/2019    Review of Glycemic Control Results for Abigail Walker, Abigail Walker (MRN [id number]) as of 12/06/2019 07:57  Ref. Range 12/05/2019 23:58 12/06/2019 07:23  Glucose-Capillary Latest Ref Range: 70 - 99 mg/dL 315 (H) 234 (H)   Diabetes history: DM 2 Outpatient Diabetes medications:  Novolin 70/30 25 units with first meal of day and 5-10 units with dinner Last A1C=10.9% Current orders for Inpatient glycemic control:  Levemir 20 units q HS Novolog 0-15 units tid with meals  Inpatient Diabetes Program Recommendations:    Agree with orders.  Based on chart review it appears that patient has struggled with glycemic control for a while and last A1C was >10%.  Will talk to patient today.  Note plans for SNF.  Basal/Bolus regimen is ideal for this patient due to her inconsistent eating, however cost may be a barrier.    Thanks,  Adah Perl, RN, BC-ADM Inpatient Diabetes Coordinator Pager [phone] (8a-5p)  Addendum 1600- Spoke with patient by phone.  She states that blood sugars sometimes drop and she falls?? However she does not think her blood sugars dropped with this fall.  She complains of dizziness and instability but states that she does not think she needs rehab or SNF.  She plans to f/u with diabetes specialist.  It appears that her A1C has been high for sometime and that adjustments have been made.  We discussed importance of recognizing symptoms of low blood sugars and treatment.  She states " I know what to do".  No further needs related to DM at this time.

## 2019-12-06 NOTE — ED Notes (Signed)
Back brace to be delivered tomorrow morning.

## 2019-12-07 LAB — BASIC METABOLIC PANEL
Anion gap: 4 — ABNORMAL LOW (ref 5–15)
BUN: 20 mg/dL (ref 8–23)
CO2: 26 mmol/L (ref 22–32)
Calcium: 8.7 mg/dL — ABNORMAL LOW (ref 8.9–10.3)
Chloride: 108 mmol/L (ref 98–111)
Creatinine, Ser: 1.11 mg/dL — ABNORMAL HIGH (ref 0.44–1.00)
GFR, Estimated: 52 mL/min — ABNORMAL LOW (ref >60)
Glucose, Bld: 152 mg/dL — ABNORMAL HIGH (ref 70–99)
Potassium: 3.9 mmol/L (ref 3.5–5.1)
Sodium: 138 mmol/L (ref 135–145)

## 2019-12-07 LAB — CBC
HCT: 32.2 % — ABNORMAL LOW (ref 36.0–46.0)
Hemoglobin: 10.9 g/dL — ABNORMAL LOW (ref 12.0–15.0)
MCH: 29.9 pg (ref 26.0–34.0)
MCHC: 33.9 g/dL (ref 30.0–36.0)
MCV: 88.2 fL (ref 80.0–100.0)
Platelets: 192 10*3/uL (ref 150–400)
RBC: 3.65 MIL/uL — ABNORMAL LOW (ref 3.87–5.11)
RDW: 12.4 % (ref 11.5–15.5)
WBC: 3.9 10*3/uL — ABNORMAL LOW (ref 4.0–10.5)
nRBC: 0 % (ref 0.0–0.2)

## 2019-12-07 LAB — URINE CULTURE: Culture: NO GROWTH

## 2019-12-07 LAB — GLUCOSE, CAPILLARY
Glucose-Capillary: 104 mg/dL — ABNORMAL HIGH (ref 70–99)
Glucose-Capillary: 147 mg/dL — ABNORMAL HIGH (ref 70–99)
Glucose-Capillary: 236 mg/dL — ABNORMAL HIGH (ref 70–99)
Glucose-Capillary: 354 mg/dL — ABNORMAL HIGH (ref 70–99)

## 2019-12-07 MED ORDER — ACETAMINOPHEN 500 MG PO TABS
1000.0000 mg | ORAL_TABLET | Freq: Four times a day (QID) | ORAL | Status: DC | PRN
  Administered 2019-12-07: 1000 mg via ORAL
  Filled 2019-12-07: qty 2

## 2019-12-07 NOTE — Evaluation (Signed)
Physical Therapy Evaluation Patient Details Name: Abigail Walker MRN: [id number] DOB: 06-01-1945 Today's Date: 12/07/2019   History of Present Illness  74 yo Female presents to ED after sustaining several falls at home. She lives at home alone prior to admittance. Patient was found to have an L1 compression fracture with 25% loss of vertebral height. She was prescribed a TLSO brace and recommended to follow up with neurosurgery as outpatient. In addition upon admittance she was found to have hyperglycemia with blood glucose level of 562 as well as diagnosed with UTI. She will be admitted for futher medical management; PMH significant: DM, CKD III, Hypothyroidism, HTN, and GERD  Clinical Impression  74 yo Female admitted with hyperglycemia and UTI with recent falls was diagnosed with L1 compression fracture. Patient lives alone and reports being mostly mod I for self care ADLs. She does have 13 steps to get into her back door but typically will go in through the front with the ramp. Patient reports that she will hold onto furniture when walking in her home. She does report several falls in the last week. She reports her feet just get uncoordinated and don't do what she wants them to do. She does exhibit mild weakness in BLE grossly 4/5 with LLE decreased ankle DF 1/5. Patient reports she has been seeing a neurologist for LLE drop foot. She was supervision for bed mobility, min A for sit<>stand transfers, min A for gait with RW x15 feet with TLSO brace. Patient was educated on don/doff TLSO brace. She does have some difficulty with donning; Therapist adjusted brace straps for optimal positioning. She was educated on importance of wearing brace when up and walking. Patient would benefit from additional skilled PT Intervention to improve LE strength, balance and mobility; Recommend HH PT upon discharge. Patient agreeable.     Follow Up Recommendations Home health PT    Equipment Recommendations  Rolling  walker with 5" wheels    Recommendations for Other Services       Precautions / Restrictions Precautions Precautions: Fall Required Braces or Orthoses: Spinal Brace Spinal Brace: Thoracolumbosacral orthotic Restrictions Weight Bearing Restrictions: No      Mobility  Bed Mobility Overal bed mobility: Needs Assistance Bed Mobility: Supine to Sit     Supine to sit: Supervision;HOB elevated     General bed mobility comments: with min VCs for hand placement;    Transfers Overall transfer level: Needs assistance Equipment used: Rolling walker (2 wheeled) Transfers: Sit to/from Stand Sit to Stand: Min assist         General transfer comment: with TLSO brace with min VCs for hand placement; Patient able to achieve erect standing position well;  Ambulation/Gait Ambulation/Gait assistance: Min assist Gait Distance (Feet): 15 Feet Assistive device: Rolling walker (2 wheeled)       General Gait Details: with TLSO brace with min A for safety, reciprocal gait pattern, able to exhibit good control of RW and positioning, good foot clearance bilaterally; patient denies any increase in back pain with walking;  Stairs            Wheelchair Mobility    Modified Rankin (Stroke Patients Only)       Balance Overall balance assessment: Needs assistance Sitting-balance support: Bilateral upper extremity supported;Feet supported Sitting balance-Leahy Scale: Good     Standing balance support: Bilateral upper extremity supported Standing balance-Leahy Scale: Fair  Pertinent Vitals/Pain Pain Assessment: 0-10 Pain Score: 1  Pain Location: low back Pain Descriptors / Indicators: Aching Pain Intervention(s): Limited activity within patient's tolerance;Monitored during session;Repositioned    Home Living Family/patient expects to be discharged to:: Private residence Living Arrangements: Alone Available Help at Discharge:  Neighbor;Other (Comment);Available PRN/intermittently (neighbors can get groceries; doesn't have anyone to stay with her) Type of Home: Mobile home Home Access: Stairs to enter;Ramped entrance Entrance Stairs-Rails: Right;Left;Can reach both Entrance Stairs-Number of Steps: 13 steps with B rails at back door Home Layout: One level Home Equipment: Cane - single point;Other (comment);Shower seat;Wheelchair - manual Additional Comments: has walking stick;    Prior Function Level of Independence: Independent with assistive device(s)         Comments: uses walking stick and holds onto furniture, limited to short distance walking; Would spend most of her time in her recliner; Reports multiple falls in last few days.     Hand Dominance        Extremity/Trunk Assessment   Upper Extremity Assessment Upper Extremity Assessment: Defer to OT evaluation    Lower Extremity Assessment Lower Extremity Assessment: RLE deficits/detail;LLE deficits/detail RLE Deficits / Details: intact light touch sensation, ROM is WFL, strength grossly 4/5 LLE Deficits / Details: intact light touch sensation, ROM is WFL, strength grossly 4/5 however decreased ankle strength due to drop foot (1/5 ankle DF)    Cervical / Trunk Assessment Cervical / Trunk Assessment: Kyphotic  Communication   Communication: HOH  Cognition Arousal/Alertness: Awake/alert Behavior During Therapy: WFL for tasks assessed/performed Overall Cognitive Status: Within Functional Limits for tasks assessed                                 General Comments: oriented to date, person, place and situation      General Comments      Exercises     Assessment/Plan    PT Assessment Patient needs continued PT services  PT Problem List Decreased strength;Decreased mobility;Decreased safety awareness;Decreased activity tolerance;Decreased balance;Pain;Decreased knowledge of use of DME       PT Treatment Interventions DME  instruction;Therapeutic exercise;Gait training;Balance training;Stair training;Neuromuscular re-education;Functional mobility training;Therapeutic activities;Patient/family education    PT Goals (Current goals can be found in the Care Plan section)  Acute Rehab PT Goals Patient Stated Goal: to go home PT Goal Formulation: With patient Time For Goal Achievement: 12/21/19 Potential to Achieve Goals: Good    Frequency 7X/week   Barriers to discharge Inaccessible home environment;Decreased caregiver support has 13 steps in back door but ramp entrance in front; lives alone;    Co-evaluation               AM-PAC PT "6 Clicks" Mobility  Outcome Measure Help needed turning from your back to your side while in a flat bed without using bedrails?: A Little Help needed moving from lying on your back to sitting on the side of a flat bed without using bedrails?: A Little Help needed moving to and from a bed to a chair (including a wheelchair)?: A Little Help needed standing up from a chair using your arms (e.g., wheelchair or bedside chair)?: A Little Help needed to walk in hospital room?: A Little Help needed climbing 3-5 steps with a railing? : A Lot 6 Click Score: 17    End of Session Equipment Utilized During Treatment: Gait belt Activity Tolerance: Patient tolerated treatment well;No increased pain Patient left: in chair;with call bell/phone  within reach;with chair alarm set;Other (comment) (OT in room;) Nurse Communication: Mobility status PT Visit Diagnosis: Unsteadiness on feet (R26.81);Muscle weakness (generalized) (M62.81);History of falling (Z91.81);Difficulty in walking, not elsewhere classified (R26.2)    Time: 1050-1130 PT Time Calculation (min) (ACUTE ONLY): 40 min   Charges:   PT Evaluation $PT Eval Low Complexity: 1 Low            Likisha Alles  PT, DPT 12/07/2019, 1:19 PM

## 2019-12-07 NOTE — Plan of Care (Signed)
Continuing with plan of care. 

## 2019-12-07 NOTE — Progress Notes (Signed)
PROGRESS NOTE    Abigail CLOER  HEN:[id number] DOB: February 19, 1945 DOA: 12/05/2019 PCP: Kirk Ruths, MD   Assessment & Plan:   Principal Problem:   Hyperglycemia due to diabetes mellitus [facility]) Active Problems:   Benign hypertension   Adult hypothyroidism   Stage 3 chronic kidney disease ([place])   Acute lower UTI   Depression   Lumbar compression fracture (HCC)   Hyponatremia   DM2: poorly controlled, HbA1c is 10.6. Hx of medication non-compliance. Continue on IV levemir, SSI w/ accuchecks. DM coordinator following   CKDIIIb: Cr continues to trend down daily   Depression: severity unknown. Continue on home dose of paxil, bupropion.   Hypothyroidism: continue on home dose of levothyroxine   HTN: continue on home dose of lisinopril   Frequent falls: with lumbar compression fracture. Continue w/ TLSO brace. Fall precautions. Will f/u w/ neurosurg as an outpatient. Awaiting PT/OT recs   UTI: UA is positive, urine cx is pending. Continue on IV rocephin   Hyponatremia: resolved    DVT prophylaxis: lovenox  Code Status: full  Family Communication:  Disposition Plan: depends on PT/OT recs  Status is: Inpatient  Remains inpatient appropriate because:Ongoing diagnostic testing needed not appropriate for outpatient work up, Unsafe d/c plan and IV treatments appropriate due to intensity of illness or inability to take PO   Dispo: The patient is from: Home              Anticipated d/c is to: Home vs SNF              Anticipated d/c date is: 1 day, depending on PT/OT recs               Patient currently is not medically stable to d/c.     Consultants:  neurosurg  Procedures:   Antimicrobials: rocephin   Subjective: Pt c/o malaise    Objective: Vitals:   12/06/19 1608 12/06/19 2024 12/06/19 2330 12/07/19 0456  BP: (!) 129/56 (!) 133/44 (!) 132/45 138/65  Pulse: 85 83 71 79  Resp: 16 16 18 16   Temp: 97.8 F (36.6 C) 100.1 F (37.8 C) 98.9 F (37.2 C)  98.3 F (36.8 C)  TempSrc: Oral Oral Oral Oral  SpO2: 100% 100% 98% 100%  Weight:      Height:        Intake/Output Summary (Last 24 hours) at 12/07/2019 0757 Last data filed at 12/07/2019 0300 Gross per 24 hour  Intake 3886.06 ml  Output 950 ml  Net 2936.06 ml   Filed Weights   12/05/19 2127 12/06/19 0125  Weight: 56.7 kg 57.2 kg    Examination:  General exam: Appears calm & comfortable. Frail appearing  Respiratory system: clear breath sounds b/l. No wheezes, rales.  Cardiovascular system: S1 & S2 +. No  rubs, gallops or clicks. No pedal edema. Gastrointestinal system: Abdomen is nondistended, soft and nontender. Normal bowel sounds heard. Central nervous system: Alert and oriented. Moves all 4 extremities  Psychiatry: Judgement and insight appear normal. Appropriate mood and affect    Data Reviewed: I have personally reviewed following labs and imaging studies  CBC: Recent Labs  Lab 12/05/19 2231 12/07/19 0506  WBC 4.9 3.9*  NEUTROABS 2.3  --   HGB 12.4 10.9*  HCT 35.1* 32.2*  MCV 86.2 88.2  PLT 215 361   Basic Metabolic Panel: Recent Labs  Lab 12/05/19 2231 12/06/19 0522 12/07/19 0506  NA 129* 134* 138  K 4.7 3.7 3.9  CL 91* 98  108  CO2 26 24 26   GLUCOSE 562* 238* 152*  BUN 31* 29* 20  CREATININE 1.43* 1.32* 1.11*  CALCIUM 9.5 9.5 8.7*   GFR: Estimated Creatinine Clearance: 33 mL/min (A) (by C-G formula based on SCr of 1.11 mg/dL (H)). Liver Function Tests: Recent Labs  Lab 12/05/19 2231  AST 20  ALT 15  ALKPHOS 104  BILITOT 0.9  PROT 7.0  ALBUMIN 3.6   No results for input(s): LIPASE, AMYLASE in the last 168 hours. No results for input(s): AMMONIA in the last 168 hours. Coagulation Profile: No results for input(s): INR, PROTIME in the last 168 hours. Cardiac Enzymes: No results for input(s): CKTOTAL, CKMB, CKMBINDEX, TROPONINI in the last 168 hours. BNP (last 3 results) No results for input(s): PROBNP in the last 8760  hours. HbA1C: Recent Labs    12/05/19 2231  HGBA1C 10.6*   CBG: Recent Labs  Lab 12/06/19 0723 12/06/19 1151 12/06/19 1632 12/06/19 2111 12/07/19 0750  GLUCAP 234* 303* 140* 313* 104*   Lipid Profile: No results for input(s): CHOL, HDL, LDLCALC, TRIG, CHOLHDL, LDLDIRECT in the last 72 hours. Thyroid Function Tests: No results for input(s): TSH, T4TOTAL, FREET4, T3FREE, THYROIDAB in the last 72 hours. Anemia Panel: No results for input(s): VITAMINB12, FOLATE, FERRITIN, TIBC, IRON, RETICCTPCT in the last 72 hours. Sepsis Labs: No results for input(s): PROCALCITON, LATICACIDVEN in the last 168 hours.  Recent Results (from the past 240 hour(s))  Resp Panel by RT-PCR (Flu A&B, Covid) Nasopharyngeal Swab     Status: None   Collection Time: 12/06/19 12:20 AM   Specimen: Nasopharyngeal Swab; Nasopharyngeal(NP) swabs in vial transport medium  Result Value Ref Range Status   SARS Coronavirus 2 by RT PCR NEGATIVE NEGATIVE Final    Comment: (NOTE) SARS-CoV-2 target nucleic acids are NOT DETECTED.  The SARS-CoV-2 RNA is generally detectable in upper respiratory specimens during the acute phase of infection. The lowest concentration of SARS-CoV-2 viral copies this assay can detect is 138 copies/mL. A negative result does not preclude SARS-Cov-2 infection and should not be used as the sole basis for treatment or other patient management decisions. A negative result may occur with  improper specimen collection/handling, submission of specimen other than nasopharyngeal swab, presence of viral mutation(s) within the areas targeted by this assay, and inadequate number of viral copies(<138 copies/mL). A negative result must be combined with clinical observations, patient history, and epidemiological information. The expected result is Negative.  Fact Sheet for Patients:  EntrepreneurPulse.com.au  Fact Sheet for Healthcare Providers:   IncredibleEmployment.be  This test is no t yet approved or cleared by the [place] FDA and  has been authorized for detection and/or diagnosis of SARS-CoV-2 by FDA under an Emergency Use Authorization (EUA). This EUA will remain  in effect (meaning this test can be used) for the duration of the COVID-19 declaration under Section 564(b)(1) of the Act, 21 U.S.C.section 360bbb-3(b)(1), unless the authorization is terminated  or revoked sooner.       Influenza A by PCR NEGATIVE NEGATIVE Final   Influenza B by PCR NEGATIVE NEGATIVE Final    Comment: (NOTE) The Xpert Xpress SARS-CoV-2/FLU/RSV plus assay is intended as an aid in the diagnosis of influenza from Nasopharyngeal swab specimens and should not be used as a sole basis for treatment. Nasal washings and aspirates are unacceptable for Xpert Xpress SARS-CoV-2/FLU/RSV testing.  Fact Sheet for Patients: EntrepreneurPulse.com.au  Fact Sheet for Healthcare Providers: IncredibleEmployment.be  This test is not yet approved or cleared by the  [place] Architectural technologist and has been authorized for detection and/or diagnosis of SARS-CoV-2 by FDA under an Print production planner (EUA). This EUA will remain in effect (meaning this test can be used) for the duration of the COVID-19 declaration under Section 564(b)(1) of the Act, 21 U.S.C. section 360bbb-3(b)(1), unless the authorization is terminated or revoked.  Performed at [facility], [address], [place], [place] [address]          Radiology Studies: DG Ribs Unilateral W/Chest Left  Result Date: 12/05/2019 CLINICAL DATA:  74 year old female with fall. EXAM: LEFT RIBS AND CHEST - 3+ VIEW COMPARISON:  None. FINDINGS: No focal consolidation, pleural effusion, pneumothorax. There is chronic interstitial coarsening and bronchitic changes. The cardiac silhouette is within limits. Atherosclerotic calcification of the  aorta. Osteopenia with degenerative changes of the spine. No acute osseous pathology. No displaced rib fractures. IMPRESSION: No acute cardiopulmonary process. Electronically Signed   By: Anner Crete M.D.   On: 12/05/2019 23:09   CT Head Wo Contrast  Result Date: 12/05/2019 CLINICAL DATA:  Frequent falls EXAM: CT HEAD WITHOUT CONTRAST CT CERVICAL SPINE WITHOUT CONTRAST TECHNIQUE: Multidetector CT imaging of the head and cervical spine was performed following the standard protocol without intravenous contrast. Multiplanar CT image reconstructions of the cervical spine were also generated. COMPARISON:  CT 10/27/2018, MR 03/21/2018 FINDINGS: CT HEAD FINDINGS Brain: No evidence of acute infarction, hemorrhage, hydrocephalus, extra-axial collection, visible mass lesion or mass effect. Symmetric prominence of the ventricles, cisterns and sulci compatible with moderate parenchymal volume loss. Mixed patchy and confluent areas of white matter hypoattenuation are most compatible with chronic microvascular angiopathy. Benign dural calcifications. Cerebellar tonsils are normally positioned. Midline intracranial structures are unremarkable. None Vascular: Atherosclerotic calcification of the carotid siphons and intradural vertebral arteries. No hyperdense vessel. Skull: Chronic scarring in the right frontal scalp. No acute scalp swelling or hematoma. No calvarial fracture or worrisome acute osseous lesions. Sinuses/Orbits: Paranasal sinuses and mastoid air cells are predominantly clear. Orbital structures are unremarkable aside from prior lens extractions. Other: None. CT CERVICAL SPINE FINDINGS Alignment: Cervical stabilization collar is absent at the time of examination. Mild cervical flexion noted on the scout view. Slight leftward cranial rotation is present as well. There is straightening and reversal the normal cervical lordosis which is unchanged from prior and favored to be on a degenerative basis, slightly  accentuated by positioning. No evidence of traumatic listhesis. No abnormally widened, perched or jumped facets. Normal alignment of the craniocervical and atlantoaxial articulations. Skull base and vertebrae: No acute skull base fracture. No vertebral body fracture or height loss. Normal bone mineralization. No worrisome osseous lesions. Mild atlantodental arthrosis. Cervical spondylitic changes, as below. Soft tissues and spinal canal: No pre or paravertebral fluid or swelling. No visible canal hematoma. Disc levels: Multilevel intervertebral disc height loss with spondylitic endplate changes. Multilevel disc osteophyte complex formations are present which result in some mild canal stenoses at C3-4, C5-6. Additional levels fat partial effacement of the ventral thecal sac. Multilevel uncinate spurring and facet hypertrophic changes are present as well resulting in mild-to-moderate multilevel neural foraminal narrowing without severe foraminal impingement. Upper chest: No acute abnormality in the upper chest or imaged lung apices. Mild apical emphysematous changes. Cervical carotid atherosclerosis. Additional calcifications in the proximal great vessels. Other: Normal thyroid IMPRESSION: 1. No acute intracranial abnormality. 2. Moderate parenchymal volume loss and chronic microvascular ischemic white matter disease. 3. No acute fracture or traumatic listhesis of the cervical spine. 4. Multilevel degenerative changes of  the cervical spine as described above. 5. Cervical and intracranial atherosclerosis. 6. Emphysema (ICD10-J43.9). Electronically Signed   By: Lovena Le M.D.   On: 12/05/2019 23:21   CT Cervical Spine Wo Contrast  Result Date: 12/05/2019 CLINICAL DATA:  Frequent falls EXAM: CT HEAD WITHOUT CONTRAST CT CERVICAL SPINE WITHOUT CONTRAST TECHNIQUE: Multidetector CT imaging of the head and cervical spine was performed following the standard protocol without intravenous contrast. Multiplanar CT image  reconstructions of the cervical spine were also generated. COMPARISON:  CT 10/27/2018, MR 03/21/2018 FINDINGS: CT HEAD FINDINGS Brain: No evidence of acute infarction, hemorrhage, hydrocephalus, extra-axial collection, visible mass lesion or mass effect. Symmetric prominence of the ventricles, cisterns and sulci compatible with moderate parenchymal volume loss. Mixed patchy and confluent areas of white matter hypoattenuation are most compatible with chronic microvascular angiopathy. Benign dural calcifications. Cerebellar tonsils are normally positioned. Midline intracranial structures are unremarkable. None Vascular: Atherosclerotic calcification of the carotid siphons and intradural vertebral arteries. No hyperdense vessel. Skull: Chronic scarring in the right frontal scalp. No acute scalp swelling or hematoma. No calvarial fracture or worrisome acute osseous lesions. Sinuses/Orbits: Paranasal sinuses and mastoid air cells are predominantly clear. Orbital structures are unremarkable aside from prior lens extractions. Other: None. CT CERVICAL SPINE FINDINGS Alignment: Cervical stabilization collar is absent at the time of examination. Mild cervical flexion noted on the scout view. Slight leftward cranial rotation is present as well. There is straightening and reversal the normal cervical lordosis which is unchanged from prior and favored to be on a degenerative basis, slightly accentuated by positioning. No evidence of traumatic listhesis. No abnormally widened, perched or jumped facets. Normal alignment of the craniocervical and atlantoaxial articulations. Skull base and vertebrae: No acute skull base fracture. No vertebral body fracture or height loss. Normal bone mineralization. No worrisome osseous lesions. Mild atlantodental arthrosis. Cervical spondylitic changes, as below. Soft tissues and spinal canal: No pre or paravertebral fluid or swelling. No visible canal hematoma. Disc levels: Multilevel  intervertebral disc height loss with spondylitic endplate changes. Multilevel disc osteophyte complex formations are present which result in some mild canal stenoses at C3-4, C5-6. Additional levels fat partial effacement of the ventral thecal sac. Multilevel uncinate spurring and facet hypertrophic changes are present as well resulting in mild-to-moderate multilevel neural foraminal narrowing without severe foraminal impingement. Upper chest: No acute abnormality in the upper chest or imaged lung apices. Mild apical emphysematous changes. Cervical carotid atherosclerosis. Additional calcifications in the proximal great vessels. Other: Normal thyroid IMPRESSION: 1. No acute intracranial abnormality. 2. Moderate parenchymal volume loss and chronic microvascular ischemic white matter disease. 3. No acute fracture or traumatic listhesis of the cervical spine. 4. Multilevel degenerative changes of the cervical spine as described above. 5. Cervical and intracranial atherosclerosis. 6. Emphysema (ICD10-J43.9). Electronically Signed   By: Lovena Le M.D.   On: 12/05/2019 23:21   CT Thoracic Spine Wo Contrast  Result Date: 12/06/2019 CLINICAL DATA:  Frequent falls EXAM: CT THORACIC SPINE WITHOUT CONTRAST TECHNIQUE: Multidetector CT images of the thoracic were obtained using the standard protocol without intravenous contrast. COMPARISON:  Contemporary lumbar spine radiographs FINDINGS: Segmentation: 12 rib-bearing thoracic type vertebral levels. Alignment: Mild exaggeration of the normal thoracic kyphosis. No abnormally widened, perched or jumped facets. Vertebrae: The osseous structures appear diffusely demineralized which may limit detection of small or nondisplaced fractures. Slightly sclerotic endplate changes and central cuffing in the inferior endplate T12 with a more lucent Schmorl's node formation and intervertebral extension of nitrogenous gas from the disc  space may reflect an acute to subacute Schmorl's  node formation rather than true compression deformity. No other acute thoracic spine fracture or vertebral body height loss is seen. Posterior elements and transverse processes are intact. Redemonstration of an incomplete L1 burst fracture with up to 25% height loss, as described on dedicated lumbar spine imaging. Multilevel discogenic changes and more mild facet degenerative change are noted throughout the thoracic spine, better detailed below. Paraspinal and other soft tissues: Paravertebral thickening adjacent the T12-L1 disc space likely related to the acute compression deformity. No other visible perispinal fluid, swelling, gas or hemorrhage. No visible canal hematoma. Included portions of the chest and upper abdomen demonstrate extensive aortic atherosclerosis and calcification throughout the branch vessels. Three-vessel coronary artery atherosclerosis noted as well. 3 mm solid nodule is seen in the anterosuperior segment right upper lobe (2/33). No other acute or worrisome abnormality in the included portions of the posterior chest and abdomen. Disc levels: Diffuse intervertebral disc height loss with multilevel desiccation and vascular disc, endplate sclerosis and anterior predominant spurring with multilevel Schmorl's node formation including the more acute to subacute appearing formation in the inferior endplate T12. Despite these findings, there is no significant spinal canal stenosis or neural foraminal narrowing within the thoracic levels. IMPRESSION: 1. L1 compression deformity, better detailed dedicated lumbar imaging. 2. Slightly sclerotic endplate change and central cupping in the inferior endplate T12 with a rounded lucency and extension of nitrogenous gas from the disc space is favored to reflect an acute to subacute Schmorl's node formation rather than true compression deformity. 3. No other acute thoracic spine fracture or vertebral body height loss. 4. Multilevel degenerative changes of the  thoracic spine without significant spinal canal stenosis or neural foraminal narrowing within the thoracic levels. 5. 3 mm solid nodule in the anterosuperior segment right upper lobe. No follow-up needed if patient is low-risk. Non-contrast chest CT can be considered in 12 months if patient is high-risk. This recommendation follows the consensus statement: Guidelines for Management of Incidental Pulmonary Nodules Detected on CT Images: From the Fleischner Society 2017; Radiology 2017; 284:228-243. 6. Three-vessel coronary artery atherosclerosis. 7. Aortic Atherosclerosis (ICD10-I70.0). Electronically Signed   By: Lovena Le M.D.   On: 12/06/2019 00:01   CT Lumbar Spine Wo Contrast  Result Date: 12/05/2019 CLINICAL DATA:  Back pain after fall EXAM: CT LUMBAR SPINE WITHOUT CONTRAST TECHNIQUE: Multidetector CT imaging of the lumbar spine was performed without intravenous contrast administration. Multiplanar CT image reconstructions were also generated. COMPARISON:  MR lumbar spine Jun 08, 2019 FINDINGS: Segmentation: There are 5 non-rib bearing lumbar type vertebral bodies with the last intervertebral disc space labeled as L5-S1. Alignment: There is a mild leftward curvature of the lumbar spine. There is a minimal anterolisthesis of L4 on L5 and L5 on S1. Vertebrae: There is an acute superior compression fracture of the L1 vertebral body with slight buckling of the anterior superior cortex and less than 25% loss in height. No retropulsion of fragments or extension to the posterior elements is seen. Paraspinal and other soft tissues: The paraspinal soft tissues and visualized retroperitoneal structures are unremarkable. The sacroiliac joints are intact. Disc levels: Mild lumbar spine spondylosis most notable at L3-L4 and L4-L5 with moderate neural foraminal narrowing and mild central canal stenosis. IMPRESSION: Acute superior compression fracture of the L1 vertebral body with buckling of the anterior superior  cortex and less than 25% loss in height. No retropulsion of fragments is seen. Electronically Signed   By: Prudencio Pair  M.D.   On: 12/05/2019 23:19   DG HIP UNILAT WITH PELVIS 2-3 VIEWS LEFT  Result Date: 12/05/2019 CLINICAL DATA:  Pain EXAM: DG HIP (WITH OR WITHOUT PELVIS) 2-3V LEFT COMPARISON:  None. FINDINGS: There is no evidence of hip fracture or dislocation. Moderate left hip osteoarthritis is seen with superior joint space loss and marginal osteophyte formation. Scattered vascular calcifications are noted. No focal soft tissue swelling. IMPRESSION: No acute osseous abnormality. Electronically Signed   By: Prudencio Pair M.D.   On: 12/05/2019 23:09   DG HIP UNILAT WITH PELVIS 2-3 VIEWS RIGHT  Result Date: 12/05/2019 CLINICAL DATA:  Frequent falls EXAM: DG HIP (WITH OR WITHOUT PELVIS) 2-3V RIGHT COMPARISON:  None. FINDINGS: Hip joints and SI joints symmetric. No acute bony abnormality. Specifically, no fracture, subluxation, or dislocation. IMPRESSION: No acute bony abnormality. Electronically Signed   By: Rolm Baptise M.D.   On: 12/05/2019 23:09        Scheduled Meds: . buPROPion  150 mg Oral Daily  . enoxaparin (LOVENOX) injection  40 mg Subcutaneous Q24H  . fentaNYL (SUBLIMAZE) injection  25 mcg Intravenous Once  . ferrous sulfate  325 mg Oral Q breakfast  . fluconazole  200 mg Oral Daily  . insulin aspart  0-15 Units Subcutaneous TID WC  . insulin detemir  20 Units Subcutaneous QHS  . levothyroxine  75 mcg Oral Q0600  . lisinopril  5 mg Oral Daily  . pantoprazole  40 mg Oral Daily  . PARoxetine  40 mg Oral [id number]  . pravastatin  80 mg Oral Daily  . torsemide  10 mg Oral Daily   Continuous Infusions: . sodium chloride    . sodium chloride 150 mL/hr at 12/07/19 0642  . cefTRIAXone (ROCEPHIN)  IV Stopped (12/07/19 0004)     LOS: 2 days    Time spent: 32 mins     Wyvonnia Dusky, MD [facility] Pager [phone]  If 7PM-7AM, please contact  night-coverage [email or web address] 12/07/2019, 7:57 AM

## 2019-12-07 NOTE — Evaluation (Signed)
Occupational Therapy Evaluation Patient Details Name: Abigail Walker MRN: [id number] DOB: March 02, 1945 Today's Date: 12/07/2019    History of Present Illness 74 yo Female presents to ED after sustaining several falls at home. She lives at home alone prior to admittance. Patient was found to have an L1 compression fracture with 25% loss of vertebral height. She was prescribed a TLSO brace and recommended to follow up with neurosurgery as outpatient. In addition upon admittance she was found to have hyperglycemia with blood glucose level of 562 as well as diagnosed with UTI. She will be admitted for futher medical management; PMH significant: DM, CKD III, Hypothyroidism, HTN, and GERD   Clinical Impression   Ms Dufner was seen for OT evaluation this date. Prior to hospital admission, pt was MOD I using walking stick - endorses falls history. Pt lives alone in house c accessible bathroom. Pt presents to acute OT demonstrating impaired ADL performance and functional mobility 2/2 decreased activity tolerance and functional balance/strength deficits. Pt currently requires  MIN VCs don/doff TLSO brace seated EOC, CGA + RW adjust brace in standing. SUPERVISION for LBD seated EOC. CGA + RW for ADL t/f. Pt would benefit from skilled OT to address noted impairments and functional limitations (see below for any additional details) in order to maximize safety and independence while minimizing falls risk and caregiver burden. Upon hospital discharge, recommend HHOT to maximize pt safety and return to functional independence during meaningful occupations of daily life.     Follow Up Recommendations  Home health OT    Equipment Recommendations  3 in 1 bedside commode    Recommendations for Other Services       Precautions / Restrictions Precautions Precautions: Fall Required Braces or Orthoses: Spinal Brace Spinal Brace: Thoracolumbosacral orthotic;Applied in sitting position Restrictions Weight Bearing  Restrictions: No      Mobility Bed Mobility Overal bed mobility: Needs Assistance     General bed mobility comments: Pt received and left up in chair     Transfers Overall transfer level: Needs assistance Equipment used: Rolling walker (2 wheeled) Transfers: Sit to/from Stand Sit to Stand: Min guard         General transfer comment: VCs for hand placement    Balance Overall balance assessment: Needs assistance Sitting-balance support: Feet supported;No upper extremity supported Sitting balance-Leahy Scale: Good     Standing balance support: Bilateral upper extremity supported Standing balance-Leahy Scale: Fair      ADL either performed or assessed with clinical judgement   ADL Overall ADL's : Needs assistance/impaired       General ADL Comments: MIN VCs don/doff TLSO brace seated EOC, CGA + RW adjust brace in standing. SUPERVISION for LBD seated EOC. CGA + RW for ADL t/f.                   Pertinent Vitals/Pain Pain Assessment: 0-10 Pain Score: 1  Pain Location: low back Pain Descriptors / Indicators: Aching Pain Intervention(s): Monitored during session     Hand Dominance     Extremity/Trunk Assessment Upper Extremity Assessment Upper Extremity Assessment: Overall WFL for tasks assessed   Lower Extremity Assessment Lower Extremity Assessment: Generalized weakness RLE Deficits / Details: intact light touch sensation, ROM is WFL, strength grossly 4/5 LLE Deficits / Details: intact light touch sensation, ROM is WFL, strength grossly 4/5 however decreased ankle strength due to drop foot (1/5 ankle DF)   Cervical / Trunk Assessment Cervical / Trunk Assessment: Kyphotic   Communication Communication Communication: [facility]  Cognition Arousal/Alertness: Awake/alert Behavior During Therapy: WFL for tasks assessed/performed Overall Cognitive Status: Within Functional Limits for tasks assessed       General Comments: oriented to date, person, place and  situation   General Comments       Exercises Exercises: Other exercises Other Exercises Other Exercises: Pt educated re: OT role, falls prevention, TLSO education, pain mgmt, HEP, d/c recs, DME recs Other Exercises: LBD, don/doff TLSO, sit<>stand, sitting/standing balance/tolerance   Shoulder Instructions      Home Living Family/patient expects to be discharged to:: Private residence Living Arrangements: Alone Available Help at Discharge: Neighbor;Other (Comment);Available PRN/intermittently (neighbors can get groceries; doesn't have anyone to stay w/) Type of Home: Mobile home Home Access: Stairs to enter;Ramped entrance Entrance Stairs-Number of Steps: 13 steps with B rails at back door Entrance Stairs-Rails: Right;Left;Can reach both Home Layout: One level     Bathroom Shower/Tub: Occupational psychologist: Standard     Home Equipment: Cane - single point;Other (comment);Shower seat;Wheelchair - manual   Additional Comments: has walking stick;      Prior Functioning/Environment Level of Independence: Independent with assistive device(s)        Comments: uses walking stick and holds onto furniture, limited to short distance walking; Would spend most of her time in her recliner; Reports multiple falls in last few days.        OT Problem List: Decreased strength;Decreased activity tolerance;Impaired balance (sitting and/or standing);Decreased safety awareness      OT Treatment/Interventions: Self-care/ADL training;Therapeutic exercise;Energy conservation;DME and/or AE instruction;Therapeutic activities;Patient/family education;Balance training    OT Goals(Current goals can be found in the care plan section) Acute Rehab OT Goals Patient Stated Goal: to go home OT Goal Formulation: With patient Time For Goal Achievement: 12/21/19 Potential to Achieve Goals: Good ADL Goals Pt Will Perform Grooming: with modified independence;standing (c LRAD PRN) Pt Will  Perform Lower Body Dressing: with modified independence;sit to/from stand (c LRAD PRN) Pt Will Transfer to Toilet: with modified independence;ambulating;regular height toilet (c LRAD PRN)  OT Frequency: Min 1X/week   Barriers to D/C: Decreased caregiver support             AM-PAC OT "6 Clicks" Daily Activity     Outcome Measure Help from another person eating meals?: None Help from another person taking care of personal grooming?: A Little Help from another person toileting, which includes using toliet, bedpan, or urinal?: A Little Help from another person bathing (including washing, rinsing, drying)?: A Little Help from another person to put on and taking off regular upper body clothing?: None Help from another person to put on and taking off regular lower body clothing?: A Little 6 Click Score: 20   End of Session Equipment Utilized During Treatment: Rolling walker;Back brace  Activity Tolerance: Patient tolerated treatment well Patient left: in chair;with call bell/phone within reach;with chair alarm set;with nursing/sitter in room (NT in room end of session)  OT Visit Diagnosis: Other abnormalities of gait and mobility (R26.89)                Time: 3086-5784 OT Time Calculation (min): 15 min Charges:  OT General Charges $OT Visit: 1 Visit OT Evaluation $OT Eval Low Complexity: 1 Low OT Treatments $Self Care/Home Management : 8-22 mins  Dessie Coma, M.S. OTR/L  12/07/19, 2:40 PM  ascom [phone]

## 2019-12-07 NOTE — TOC Progression Note (Signed)
Transition of Care [facility]) - Progression Note    Patient Details  Name: Abigail Walker MRN: [id number] Date of Birth: 1945-11-16  Transition of Care [facility]) CM/SW Contact  Zigmund Daniel Dorian Pod, RN Phone Number: 12/07/2019, 3:30 PM  Clinical Narrative:    Pt evaluated for PT/OT for [facility]. RN spoke directly with the pt and inquired on choice with available agencies. Pt receptive to any agency for [facility] and DME however only needs a rolling walker. Pt states she lives in a mobile home with no available family however able to make request from her neighbor for transportation assistance if needed. Pt states she can afford her medications and continues to drive herself to all her appointments if allowed. Declined the 3-1 commode indicating it is a short distance to get to her bathroom. No other needs at this time.   Corene Cornea with [facility] has accepted pt for PT/OT and [employer] ([place]) will deliver rolling walker to the pt's room in the AM. No other request at this time.   TOC team will continue to follow until discharge.  Expected Discharge Plan: Home/Self Care Barriers to Discharge: No Barriers Identified  Expected Discharge Plan and Services Expected Discharge Plan: Home/Self Care In-house Referral: Clinical Social Work     Living arrangements for the past 2 months: Mobile Home                 DME Arranged: Walker rolling DME Agency: Other - Comment [facility]) Date DME Agency Contacted: 12/07/19 Time DME Agency Contacted: [phone]   [place]: OT, PT [place] Agency: [place] ([place]) Date [place]: 12/07/19 Time [place]: 1527 Representative spoke with at [place]: [place] ([place]) Interventions    Readmission Risk Interventions No flowsheet data found.

## 2019-12-08 DIAGNOSIS — W19XXXD Unspecified fall, subsequent encounter: Secondary | ICD-10-CM

## 2019-12-08 DIAGNOSIS — W19XXXA Unspecified fall, initial encounter: Secondary | ICD-10-CM

## 2019-12-08 DIAGNOSIS — R739 Hyperglycemia, unspecified: Secondary | ICD-10-CM

## 2019-12-08 LAB — BASIC METABOLIC PANEL
Anion gap: 9 (ref 5–15)
BUN: 19 mg/dL (ref 8–23)
CO2: 25 mmol/L (ref 22–32)
Calcium: 9.1 mg/dL (ref 8.9–10.3)
Chloride: 103 mmol/L (ref 98–111)
Creatinine, Ser: 1.15 mg/dL — ABNORMAL HIGH (ref 0.44–1.00)
GFR, Estimated: 50 mL/min — ABNORMAL LOW (ref >60)
Glucose, Bld: 253 mg/dL — ABNORMAL HIGH (ref 70–99)
Potassium: 3.8 mmol/L (ref 3.5–5.1)
Sodium: 137 mmol/L (ref 135–145)

## 2019-12-08 LAB — GLUCOSE, CAPILLARY
Glucose-Capillary: 124 mg/dL — ABNORMAL HIGH (ref 70–99)
Glucose-Capillary: 219 mg/dL — ABNORMAL HIGH (ref 70–99)

## 2019-12-08 LAB — CBC
HCT: 33.5 % — ABNORMAL LOW (ref 36.0–46.0)
Hemoglobin: 11.7 g/dL — ABNORMAL LOW (ref 12.0–15.0)
MCH: 30.8 pg (ref 26.0–34.0)
MCHC: 34.9 g/dL (ref 30.0–36.0)
MCV: 88.2 fL (ref 80.0–100.0)
Platelets: 195 10*3/uL (ref 150–400)
RBC: 3.8 MIL/uL — ABNORMAL LOW (ref 3.87–5.11)
RDW: 12.5 % (ref 11.5–15.5)
WBC: 3.9 10*3/uL — ABNORMAL LOW (ref 4.0–10.5)
nRBC: 0 % (ref 0.0–0.2)

## 2019-12-08 NOTE — Progress Notes (Signed)
Physical Therapy Treatment Patient Details Name: Abigail Walker MRN: [id number] DOB: [birth year] Today's Date: 12/08/2019    History of Present Illness 74 yo Female presents to ED after sustaining several falls at home. She lives at home alone prior to admittance. Patient was found to have an L1 compression fracture with 25% loss of vertebral height. She was prescribed a TLSO brace and recommended to follow up with neurosurgery as outpatient. In addition upon admittance she was found to have hyperglycemia with blood glucose level of 562 as well as diagnosed with UTI. She will be admitted for futher medical management; PMH significant: DM, CKD III, Hypothyroidism, HTN, and GERD    PT Comments    Pt ready for session.  Assisted to commode and then to don brace.  She is able to progress gait x 1 lap slowly around unit.  She stated she feels comfortable with discharge home despite little to no assist.  Will have [facility] services per pt.  Remained in recliner after session.   Follow Up Recommendations  Home health PT     Equipment Recommendations  Rolling walker with 5" wheels    Recommendations for Other Services       Precautions / Restrictions Precautions Precautions: Fall Required Braces or Orthoses: Spinal Brace Spinal Brace: Thoracolumbosacral orthotic;Applied in sitting position Restrictions Weight Bearing Restrictions: No    Mobility  Bed Mobility               General bed mobility comments: sitting EOB on her own upon arrival.  Transfers Overall transfer level: Needs assistance Equipment used: Rolling walker (2 wheeled) Transfers: Sit to/from Stand Sit to Stand: Min guard         General transfer comment: VCs for hand placement  Ambulation/Gait Ambulation/Gait assistance: Min guard Gait Distance (Feet): 180 Feet Assistive device: Rolling walker (2 wheeled)   Gait velocity: decreased   General Gait Details: slow but generally steady gait.  cues to push rather  than pick up walker   Stairs             Wheelchair Mobility    Modified Rankin (Stroke Patients Only)       Balance Overall balance assessment: Needs assistance Sitting-balance support: Feet supported;No upper extremity supported Sitting balance-Leahy Scale: Good     Standing balance support: Bilateral upper extremity supported Standing balance-Leahy Scale: Fair                              Cognition Arousal/Alertness: Awake/alert Behavior During Therapy: WFL for tasks assessed/performed Overall Cognitive Status: Within Functional Limits for tasks assessed                                 General Comments: oriented to date, person, place and situation      Exercises      General Comments        Pertinent Vitals/Pain Pain Assessment: Faces Faces Pain Scale: Hurts a little bit Pain Location: back and shoulders Pain Descriptors / Indicators: Aching;Sore    Home Living                      Prior Function            PT Goals (current goals can now be found in the care plan section) Progress towards PT goals: Progressing toward goals    Frequency  7X/week      PT Plan      Co-evaluation              AM-PAC PT "6 Clicks" Mobility   Outcome Measure  Help needed turning from your back to your side while in a flat bed without using bedrails?: A Little Help needed moving from lying on your back to sitting on the side of a flat bed without using bedrails?: A Little Help needed moving to and from a bed to a chair (including a wheelchair)?: A Little Help needed standing up from a chair using your arms (e.g., wheelchair or bedside chair)?: A Little Help needed to walk in hospital room?: A Little Help needed climbing 3-5 steps with a railing? : A Lot 6 Click Score: 17    End of Session Equipment Utilized During Treatment: Gait belt Activity Tolerance: Patient tolerated treatment well;No increased  pain Patient left: in chair;with call bell/phone within reach;with chair alarm set;Other (comment) (OT in room;) Nurse Communication: Mobility status PT Visit Diagnosis: Unsteadiness on feet (R26.81);Muscle weakness (generalized) (M62.81);History of falling (Z91.81);Difficulty in walking, not elsewhere classified (R26.2)     Time: 7169-6789 PT Time Calculation (min) (ACUTE ONLY): 24 min  Charges:  $Gait Training: 23-37 mins                    Chesley Noon, PTA 12/08/19, 11:40 AM

## 2019-12-08 NOTE — Discharge Instructions (Signed)
Spinal Compression Fracture ° °A spinal compression fracture is a collapse of the bones that form the spine (vertebrae). With this type of fracture, the vertebrae become pushed (compressed) into a wedge shape. Most compression fractures happen in the middle or lower part of the spine. °What are the causes? °This condition may be caused by: °· Thinning and loss of density in the bones (osteoporosis). This is the most common cause. °· A fall. °· A car or motorcycle accident. °· Cancer. °· Trauma, such as a heavy, direct hit to the head or back. °What increases the risk? °You are more likely to develop this condition if: °· You are 60 years or older. °· You have osteoporosis. °· You have certain types of cancer, including: °? Multiple myeloma. °? Lymphoma. °? Prostate cancer. °? Lung cancer. °? Breast cancer. °What are the signs or symptoms? °Symptoms of this condition include: °· Severe pain. °· Pain that gets worse over time. °· Pain that is worse when you stand, walk, sit, or bend. °· Sudden pain that is so bad that it is hard for you to move. °· Bending or humping of the spine. °· Gradual loss of height. °· Numbness, tingling, or weakness in the back and legs. °· Trouble walking. °Your symptoms will depend on the cause of the fracture and how quickly it develops. °How is this diagnosed? °This condition may be diagnosed based on symptoms, medical history, and a physical exam. During the physical exam, your health care provider may tap along the length of your spine to check for tenderness. Tests may be done to confirm the diagnosis. They may include: °· A bone mineral density test to check for osteoporosis. °· Imaging tests, such as a spine X-ray, CT scan, or MRI. °How is this treated? °Treatment for this condition depends on the cause and severity of the condition. Some fractures may heal on their own with supportive care. Treatment may include: °· Pain medicine. °· Rest. °· A back brace. °· Physical therapy  exercises. °· Medicine to strengthen bone. °· Calcium and vitamin D supplements. °Fractures that cause the back to become misshapen, cause nerve pain or weakness, or do not respond to other treatment may be treated with surgery. This may include: °· Vertebroplasty. Bone cement is injected into the collapsed vertebrae to stabilize them. °· Balloon kyphoplasty. The collapsed vertebrae are expanded with a balloon and then bone cement is injected into them. °· Spinal fusion. The collapsed vertebrae are connected (fused) to normal vertebrae. °Follow these instructions at home: °Medicines °· Take over-the-counter and prescription medicines only as told by your health care provider. °· Do not drive or operate heavy machinery while taking prescription pain medicine. °· If you are taking prescription pain medicine, take actions to prevent or treat constipation. Your health care provider may recommend that you: °? Drink enough fluid to keep your urine pale yellow. °? Eat foods that are high in fiber, such as fresh fruits and vegetables, whole grains, and beans. °? Limit foods that are high in fat and processed sugars, such as fried or sweet foods. °? Take an over-the-counter or prescription medicine for constipation. °If you have a brace: °· Wear the brace as told by your health care provider. Remove it only as told by your health care provider. °· Loosen the brace if your fingers or toes tingle, become numb, or turn cold and blue. °· Keep the brace clean. °· If the brace is not waterproof: °? Do not let it get wet. °?   Cover it with a watertight covering when you take a bath or a shower. Managing pain, stiffness, and swelling   If directed, apply ice to the injured area: ? If you have a removable brace, remove it as told by your health care provider. ? Put ice in a plastic bag. ? Place a towel between your skin and the bag. ? Leave the ice on for 30 minutes every two hours at first. Then apply the ice as needed.  Activity  Rest as told by your health care provider. ? Avoid sitting for a long time without moving. Get up to take short walks every 1-2 hours. This is important to improve blood flow and breathing. Ask for help if you feel weak or unsteady.  Return to your normal activities as directed by your health care provider. Ask what activities are safe for you.  Do exercises to improve motion and strength in your back (physical therapy), as recommended by your health care provider.  Exercise regularly as directed by your health care provider. General instructions   Do not drink alcohol. Alcohol can interfere with your treatment.  Do not use any products that contain nicotine or tobacco, such as cigarettes and e-cigarettes. These can delay bone healing. If you need help quitting, ask your health care provider.  Keep all follow-up visits as told by your health care provider. This is important. It can help to prevent permanent injury, disability, and long-lasting (chronic) pain. Contact a health care provider if:  You have a fever.  You develop a cough that makes your pain worse.  Your pain medicine is not helping.  Your pain does not get better over time.  You cannot return to your normal activities as planned or expected. Get help right away if:  Your pain is very bad and it suddenly gets worse.  You are unable to move any body part (paralysis) that is below the level of your injury.  You have numbness, tingling, or weakness in any body part that is below the level of your injury.  You cannot control your bladder or bowels. Summary  A spinal compression fracture is a collapse of the bones that form the spine (vertebrae).  With this type of fracture, the vertebrae become pushed (compressed) into a wedge shape.  Your symptoms and treatment will depend on the cause and severity of the fracture and how quickly it develops.  Some fractures may heal on their own with supportive care.  Fractures that cause the back to become misshapen, cause nerve pain or weakness, or do not respond to other treatment may be treated with surgery. This information is not intended to replace advice given to you by your health care provider. Make sure you discuss any questions you have with your health care provider. Document Revised: 03/01/2018 Document Reviewed: 02/14/2017 Elsevier Patient Education  2020 [employer] Inc[employer]  

## 2019-12-08 NOTE — TOC Transition Note (Addendum)
Transition of Care [facility]) - CM/SW Discharge Note   Patient Details  Name: Abigail Walker MRN: [id number] Date of Birth: 04-25-1945  Transition of Care [facility]) CM/SW Contact:  Harriet Masson, RN Phone Number: 12/08/2019, 9:26 AM   Clinical Narrative:    Confirmed requested  PT/OT ([facility]) who indicated pt would be seen tomorrow. [employer] has delivered the rolling walker to the pt's room. Bedside RN aware to contact [facility] RN if taxi voucher is needed. No other needs identified at this time. RN also spoke with pt on confirmation of services provided.  Pt pending discharge today.  Addendum: TOC team provided taxi voucher for transportation assistance.   Final next level of care: Home/Self Care Barriers to Discharge: No Barriers Identified   Patient Goals and CMS Choice     Choice offered to / list presented to : Patient  Discharge Placement                       Discharge Plan and Services In-house Referral: Clinical Social Work              DME Arranged: Gilford Rile rolling DME Agency: Other - Comment Physicist, medical) Date DME Agency Contacted: 12/07/19 Time DME Agency Contacted: [phone]   [place]: OT, [place] Agency: [place] ([place]) Date [place]: 12/07/19 Time [place]: 1527 Representative spoke with at [place]: [place] ([place]) Interventions     Readmission Risk Interventions No flowsheet data found.

## 2019-12-08 NOTE — Progress Notes (Signed)
Transportation voucher received from case worker for transportation of patient to home.  Gilford Rile called and awaiting their arrival.

## 2019-12-08 NOTE — Plan of Care (Signed)
Discharge teaching completed with patient who is in stable condition. 

## 2019-12-08 NOTE — Discharge Summary (Signed)
[name]    MR#:  [id number]  DATE OF BIRTH:  1945/08/22  DATE OF ADMISSION:  12/05/2019   ADMITTING PHYSICIAN: Collier Bullock, MD  DATE OF DISCHARGE: 12/08/2019  1:37 PM  PRIMARY CARE PHYSICIAN: Kirk Ruths, MD   ADMISSION DIAGNOSIS:  Dehydration [E86.0] Fall [W19.XXXA] Hyperglycemia [R73.9] Fall, initial encounter [W19.XXXA] Compression fracture of L1 vertebra, initial encounter ([place]) [S32.010A] Hyperglycemia due to diabetes mellitus ([place]) [E11.65] DISCHARGE DIAGNOSIS:  Principal Problem:   Hyperglycemia Active Problems:   Benign hypertension   Adult hypothyroidism   Stage 3 chronic kidney disease ([place])   Acute lower UTI   Depression   Lumbar compression fracture ([facility])   Hyponatremia   Fall  SECONDARY DIAGNOSIS:   Past Medical History:  Diagnosis Date  . Acid reflux 06/16/2013  . Adult hypothyroidism 06/16/2013   Last Assessment & Plan:  Tsh and energy have been stable.    . Benign hypertension 06/16/2013   Last Assessment & Plan:  Patients blood pressure has seemingly been controlled without significant side effects such as dizziness or slow heart rate.     . Cancer ([place]) skin  . Sherran Needs syndrome   . Chronic kidney disease   . Depression   . Diabetes mellitus without complication ([place])   . GERD (gastroesophageal reflux disease)   . HBV (hepatitis B virus) infection   . High blood pressure   . Hives    food related  . Hypothyroidism   . Lichen planus   . Mixed incontinence   . Obstructive apnea 06/16/2013  . Sleep apnea   . Thyroid disease    HOSPITAL COURSE:  74 y.o. female with medical history significant for diabetes mellitus with complications of stage IIIb chronic kidney disease, hypothyroidism, hypertension, recurrent falls and GERD admitted for multiple falls secondary to vertigo with complaints of low back pain  Acute L1 spinal fracture. -Seen on x-ray, stable and  no need of surgical intervention per neurosurgery - Brace in place - ambulate with brace. OK to remove when in bed - pain well controlled  DM2: poorly controlled, HbA1c is 10.6. Hx of medication non-compliance  CKDIIIb:  At baseline  Depression: Continue on home dose of paxil, bupropion.   Hypothyroidism: continue on home dose of levothyroxine   HTN: continue on home dose of lisinopril   Frequent falls: with lumbar compression fracture. Continue w/ TLSO brace. Fall precautions. Will f/u w/ neurosurg as an outpatient. PT/OT recs  home health PT/OT which is set up by [facility] team.  Wheeled walker has also been provided per PT recommendation.  Mild UTI: UA is positive, urine cx shows no growth -treated with IV rocephin while in the hospital.  As patient is afebrile and has no leukocytosis I will stop antibiotic at discharge  Hyponatremia: resolved    DISCHARGE CONDITIONS:  Stable CONSULTS OBTAINED:   DRUG ALLERGIES:   Allergies  Allergen Reactions  . Other     Other reaction(s): Other (See Comments), Other (See Comments) CLINDYAMYCIN = UNKNOWN CLINDYAMYCIN = UNKNOWN   . Sulfa Antibiotics Nausea And Vomiting  . Amoxicillin-Pot Clavulanate Rash  . Hydrocortisone-Iodoquinol  [Hydrocortisone-Iodoquinol] Rash   DISCHARGE MEDICATIONS:   Allergies as of 12/08/2019      Reactions   Other    Other reaction(s): Other (See Comments), Other (See Comments) CLINDYAMYCIN = UNKNOWN CLINDYAMYCIN = UNKNOWN   Sulfa Antibiotics Nausea And Vomiting   Amoxicillin-pot  Clavulanate Rash   Hydrocortisone-iodoquinol  [hydrocortisone-iodoquinol] Rash      Medication List    STOP taking these medications   azelastine 0.1 % nasal spray Commonly known as: ASTELIN   HAIR SKIN AND NAILS FORMULA PO   HAIR/SKIN/NAILS PO   pantoprazole 40 MG tablet Commonly known as: PROTONIX   pioglitazone 30 MG tablet Commonly known as: ACTOS   Vitamin D3 50 MCG (2000 UT) capsule     TAKE these  medications   acetaminophen 500 MG tablet Commonly known as: TYLENOL Take 500 mg by mouth every 6 (six) hours as needed for moderate pain.   Alcortin A 1-2-1 % Gel Apply 1 application topically at bedtime.   buPROPion 150 MG 24 hr tablet Commonly known as: WELLBUTRIN XL Take 3 tablet po QD as directed   clobetasol ointment 0.05 % Commonly known as: TEMOVATE Apply as directed to affected areas groin twice a day.   ferrous sulfate 325 (65 FE) MG tablet Take 325 mg by mouth daily with breakfast.   fluconazole 200 MG tablet Commonly known as: DIFLUCAN Take 1 tablet (200 mg total) by mouth daily.   hydrocortisone cream 1 % Apply 1 application topically as needed for itching.   INSULIN SYRINGE 1CC/31GX5/16" 31G X 5/16" 1 ML Misc USE ONE SYRINGE TWICE DAILY AS DIRECTED WITH INSULIN VIALS   levothyroxine 75 MCG tablet Commonly known as: SYNTHROID TAKE ONE TABLET BY MOUTH ONCE DAILY   lisinopril 5 MG tablet Commonly known as: ZESTRIL   neomycin-bacitracin-polymyxin ointment Commonly known as: NEOSPORIN Apply 1 application topically as needed for wound care.   NovoLIN 70/30 (70-30) 100 UNIT/ML injection Generic drug: insulin NPH-regular Human Inject 16 units before breakfast and 26 units before supper as directed   NovoLIN 70/30 (70-30) 100 UNIT/ML injection Generic drug: insulin NPH-regular Human INJECT 30 UNITS SUBCUTANEOUSLY TWICE DAILY BEFORE A MEAL   nystatin cream Commonly known as: MYCOSTATIN Apply 1 application topically as needed.   PARoxetine 40 MG tablet Commonly known as: PAXIL Take 40 mg by mouth every morning.   pravastatin 80 MG tablet Commonly known as: PRAVACHOL Take 80 mg by mouth daily.   TEA TREE OIL EX Apply topically as needed.   torsemide 10 MG tablet Commonly known as: DEMADEX Take 10 mg by mouth daily.   UNABLE TO FIND as needed. [facility] antibiotic and pain relief cream      DISCHARGE INSTRUCTIONS:   DIET:  Regular diet DISCHARGE  CONDITION:  Stable ACTIVITY:  Activity as tolerated OXYGEN:  Home Oxygen: No.  Oxygen Delivery: room air DISCHARGE LOCATION:  home with home health PT/OT  If you experience worsening of your admission symptoms, develop shortness of breath, life threatening emergency, suicidal or homicidal thoughts you must seek medical attention immediately by calling 911 or calling your MD immediately  if symptoms less severe.  You Must read complete instructions/literature along with all the possible adverse reactions/side effects for all the Medicines you take and that have been prescribed to you. Take any new Medicines after you have completely understood and accpet all the possible adverse reactions/side effects.   Please note  You were cared for by a hospitalist during your hospital stay. If you have any questions about your discharge medications or the care you received while you were in the hospital after you are discharged, you can call the unit and asked to speak with the hospitalist on call if the hospitalist that took care of you is not available. Once you are discharged,  your primary care physician will handle any further medical issues. Please note that NO REFILLS for any discharge medications will be authorized once you are discharged, as it is imperative that you return to your primary care physician (or establish a relationship with a primary care physician if you do not have one) for your aftercare needs so that they can reassess your need for medications and monitor your lab values.    On the day of Discharge:  VITAL SIGNS:  Blood pressure (!) 143/67, pulse 81, temperature 97.7 F (36.5 C), temperature source Oral, resp. rate 16, height 5\' 1"  (1.549 m), weight 57.2 kg, SpO2 100 %. PHYSICAL EXAMINATION:  GENERAL:  74 y.o.-year-old patient lying in the bed with no acute distress.  EYES: Pupils equal, round, reactive to light and accommodation. No scleral icterus. Extraocular muscles intact.   HEENT: Head atraumatic, normocephalic. Oropharynx and nasopharynx clear.  NECK:  Supple, no jugular venous distention. No thyroid enlargement, no tenderness.  LUNGS: Normal breath sounds bilaterally, no wheezing, rales,rhonchi or crepitation. No use of accessory muscles of respiration.  CARDIOVASCULAR: S1, S2 normal. No murmurs, rubs, or gallops.  ABDOMEN: Soft, non-tender, non-distended. Bowel sounds present. No organomegaly or mass.  EXTREMITIES: No pedal edema, cyanosis, or clubbing.  NEUROLOGIC: Cranial nerves II through XII are intact. Muscle strength 5/5 in all extremities. Sensation intact. Gait not checked.  PSYCHIATRIC: The patient is alert and oriented x 3.  SKIN: No obvious rash, lesion, or ulcer.  DATA REVIEW:   CBC Recent Labs  Lab 12/08/19 0428  WBC 3.9*  HGB 11.7*  HCT 33.5*  PLT 195    Chemistries  Recent Labs  Lab 12/05/19 2231 12/06/19 0522 12/08/19 0428  NA 129*   < > 137  K 4.7   < > 3.8  CL 91*   < > 103  CO2 26   < > 25  GLUCOSE 562*   < > 253*  BUN 31*   < > 19  CREATININE 1.43*   < > 1.15*  CALCIUM 9.5   < > 9.1  AST 20  --   --   ALT 15  --   --   ALKPHOS 104  --   --   BILITOT 0.9  --   --    < > = values in this interval not displayed.     Outpatient follow-up  Follow-up Information    Kirk Ruths, MD. Schedule an appointment as soon as possible for a visit in 4 day(s).   Specialty: Internal Medicine Contact information: [place] [address] [phone]        Meade Maw, MD. Schedule an appointment as soon as possible for a visit in 2 week(s).   Specialty: Neurosurgery Contact information: [place] [address] [phone]                Management plans discussed with the patient, family and they are in agreement.  CODE STATUS: Prior   TOTAL TIME TAKING CARE OF THIS PATIENT: 35 minutes.    Max Sane M.D on 12/08/2019 at 7:15 PM  [facility]    CC: Primary care physician; Kirk Ruths, MD   Note: This dictation was prepared with Dragon dictation along with smaller phrase technology. Any transcriptional errors that result from this process are unintentional.

## 2019-12-24 ENCOUNTER — Other Ambulatory Visit: Payer: Self-pay | Admitting: Ophthalmology

## 2019-12-24 DIAGNOSIS — H53462 Homonymous bilateral field defects, left side: Secondary | ICD-10-CM

## 2019-12-31 ENCOUNTER — Ambulatory Visit: Payer: Medicare Other | Admitting: Dermatology

## 2020-01-02 ENCOUNTER — Other Ambulatory Visit: Payer: Self-pay

## 2020-01-02 ENCOUNTER — Ambulatory Visit
Admission: RE | Admit: 2020-01-02 | Discharge: 2020-01-02 | Disposition: A | Discharge status: Home / Self Care | Payer: Medicare Other | Source: Ambulatory Visit | Attending: Ophthalmology | Admitting: Ophthalmology

## 2020-01-02 DIAGNOSIS — H53462 Homonymous bilateral field defects, left side: Secondary | ICD-10-CM | POA: Diagnosis present

## 2020-01-02 MED ORDER — GADOBUTROL 1 MMOL/ML IV SOLN
5.0000 mL | Freq: Once | INTRAVENOUS | Status: AC | PRN
  Administered 2020-01-02: 5 mL via INTRAVENOUS

## 2020-01-08 DIAGNOSIS — E440 Moderate protein-calorie malnutrition: Secondary | ICD-10-CM | POA: Insufficient documentation

## 2020-01-09 ENCOUNTER — Emergency Department: Payer: Medicare Other

## 2020-01-09 ENCOUNTER — Other Ambulatory Visit: Payer: Self-pay

## 2020-01-09 ENCOUNTER — Observation Stay
Admission: EM | Admit: 2020-01-09 | Discharge: 2020-01-10 | Disposition: A | Discharge status: Home / Self Care | Payer: Medicare Other | Attending: Internal Medicine | Admitting: Internal Medicine

## 2020-01-09 DIAGNOSIS — Z87891 Personal history of nicotine dependence: Secondary | ICD-10-CM | POA: Diagnosis not present

## 2020-01-09 DIAGNOSIS — T68XXXA Hypothermia, initial encounter: Secondary | ICD-10-CM | POA: Diagnosis not present

## 2020-01-09 DIAGNOSIS — E860 Dehydration: Secondary | ICD-10-CM | POA: Insufficient documentation

## 2020-01-09 DIAGNOSIS — R7989 Other specified abnormal findings of blood chemistry: Secondary | ICD-10-CM | POA: Diagnosis not present

## 2020-01-09 DIAGNOSIS — I10 Essential (primary) hypertension: Secondary | ICD-10-CM

## 2020-01-09 DIAGNOSIS — E1122 Type 2 diabetes mellitus with diabetic chronic kidney disease: Secondary | ICD-10-CM | POA: Diagnosis not present

## 2020-01-09 DIAGNOSIS — N183 Chronic kidney disease, stage 3 unspecified: Secondary | ICD-10-CM | POA: Diagnosis not present

## 2020-01-09 DIAGNOSIS — E11649 Type 2 diabetes mellitus with hypoglycemia without coma: Secondary | ICD-10-CM | POA: Diagnosis present

## 2020-01-09 DIAGNOSIS — E039 Hypothyroidism, unspecified: Secondary | ICD-10-CM | POA: Diagnosis not present

## 2020-01-09 DIAGNOSIS — Z20822 Contact with and (suspected) exposure to covid-19: Secondary | ICD-10-CM | POA: Diagnosis not present

## 2020-01-09 DIAGNOSIS — I129 Hypertensive chronic kidney disease with stage 1 through stage 4 chronic kidney disease, or unspecified chronic kidney disease: Secondary | ICD-10-CM | POA: Insufficient documentation

## 2020-01-09 DIAGNOSIS — Z794 Long term (current) use of insulin: Secondary | ICD-10-CM | POA: Insufficient documentation

## 2020-01-09 DIAGNOSIS — X31XXXA Exposure to excessive natural cold, initial encounter: Secondary | ICD-10-CM | POA: Diagnosis not present

## 2020-01-09 DIAGNOSIS — Z79899 Other long term (current) drug therapy: Secondary | ICD-10-CM | POA: Insufficient documentation

## 2020-01-09 DIAGNOSIS — E162 Hypoglycemia, unspecified: Secondary | ICD-10-CM | POA: Diagnosis present

## 2020-01-09 LAB — COMPREHENSIVE METABOLIC PANEL
ALT: 18 U/L (ref 0–44)
AST: 39 U/L (ref 15–41)
Albumin: 4.1 g/dL (ref 3.5–5.0)
Alkaline Phosphatase: 80 U/L (ref 38–126)
Anion gap: 14 (ref 5–15)
BUN: 20 mg/dL (ref 8–23)
CO2: 26 mmol/L (ref 22–32)
Calcium: 10 mg/dL (ref 8.9–10.3)
Chloride: 101 mmol/L (ref 98–111)
Creatinine, Ser: 1.09 mg/dL — ABNORMAL HIGH (ref 0.44–1.00)
GFR, Estimated: 53 mL/min — ABNORMAL LOW (ref >60)
Glucose, Bld: 51 mg/dL — ABNORMAL LOW (ref 70–99)
Potassium: 4.9 mmol/L (ref 3.5–5.1)
Sodium: 141 mmol/L (ref 135–145)
Total Bilirubin: 1.1 mg/dL (ref 0.3–1.2)
Total Protein: 7.7 g/dL (ref 6.5–8.1)

## 2020-01-09 LAB — CBC WITH DIFFERENTIAL/PLATELET
Abs Immature Granulocytes: 0.02 10*3/uL (ref 0.00–0.07)
Basophils Absolute: 0 10*3/uL (ref 0.0–0.1)
Basophils Relative: 0 %
Eosinophils Absolute: 0.1 10*3/uL (ref 0.0–0.5)
Eosinophils Relative: 1 %
HCT: 43.1 % (ref 36.0–46.0)
Hemoglobin: 14.5 g/dL (ref 12.0–15.0)
Immature Granulocytes: 0 %
Lymphocytes Relative: 22 %
Lymphs Abs: 1.2 10*3/uL (ref 0.7–4.0)
MCH: 30.5 pg (ref 26.0–34.0)
MCHC: 33.6 g/dL (ref 30.0–36.0)
MCV: 90.5 fL (ref 80.0–100.0)
Monocytes Absolute: 0.5 10*3/uL (ref 0.1–1.0)
Monocytes Relative: 8 %
Neutro Abs: 4 10*3/uL (ref 1.7–7.7)
Neutrophils Relative %: 69 %
Platelets: 210 10*3/uL (ref 150–400)
RBC: 4.76 MIL/uL (ref 3.87–5.11)
RDW: 12.7 % (ref 11.5–15.5)
WBC: 5.8 10*3/uL (ref 4.0–10.5)
nRBC: 0 % (ref 0.0–0.2)

## 2020-01-09 LAB — CBG MONITORING, ED
Glucose-Capillary: 70 mg/dL (ref 70–99)
Glucose-Capillary: 115 mg/dL — ABNORMAL HIGH (ref 70–99)
Glucose-Capillary: 65 mg/dL — ABNORMAL LOW (ref 70–99)

## 2020-01-09 LAB — RESP PANEL BY RT-PCR (FLU A&B, COVID) ARPGX2
Influenza A by PCR: NEGATIVE
Influenza B by PCR: NEGATIVE
SARS Coronavirus 2 by RT PCR: NEGATIVE

## 2020-01-09 LAB — MAGNESIUM: Magnesium: 2.1 mg/dL (ref 1.7–2.4)

## 2020-01-09 LAB — TSH: TSH: 11.315 u[IU]/mL — ABNORMAL HIGH (ref 0.350–4.500)

## 2020-01-09 LAB — LIPASE, BLOOD: Lipase: 27 U/L (ref 11–51)

## 2020-01-09 LAB — LACTIC ACID, PLASMA: Lactic Acid, Venous: 2.5 mmol/L (ref 0.5–1.9)

## 2020-01-09 LAB — CK: Total CK: 116 U/L (ref 38–234)

## 2020-01-09 MED ORDER — PAROXETINE HCL 20 MG PO TABS
40.0000 mg | ORAL_TABLET | ORAL | Status: DC
  Administered 2020-01-10: 10:00:00 40 mg via ORAL
  Filled 2020-01-09 (×2): qty 2

## 2020-01-09 MED ORDER — ALCORTIN A 1-2-1 % EX GEL: 1.0000 "application " | Freq: Every day | CUTANEOUS | Status: DC

## 2020-01-09 MED ORDER — ONDANSETRON HCL 4 MG/2ML IJ SOLN: 4.0000 mg | Freq: Four times a day (QID) | INTRAMUSCULAR | Status: DC | PRN

## 2020-01-09 MED ORDER — TORSEMIDE 20 MG PO TABS
10.0000 mg | ORAL_TABLET | Freq: Every day | ORAL | Status: DC
  Administered 2020-01-10: 10:00:00 10 mg via ORAL
  Filled 2020-01-09 (×2): qty 1

## 2020-01-09 MED ORDER — PRAVASTATIN SODIUM 20 MG PO TABS
80.0000 mg | ORAL_TABLET | Freq: Every day | ORAL | Status: DC
  Filled 2020-01-09: qty 4
  Filled 2020-01-09: qty 2

## 2020-01-09 MED ORDER — ACETAMINOPHEN 650 MG RE SUPP: 650.0000 mg | Freq: Four times a day (QID) | RECTAL | Status: DC | PRN

## 2020-01-09 MED ORDER — ACETAMINOPHEN 325 MG PO TABS
650.0000 mg | ORAL_TABLET | Freq: Four times a day (QID) | ORAL | Status: DC | PRN
  Filled 2020-01-09: qty 2

## 2020-01-09 MED ORDER — DEXTROSE 50 % IV SOLN
50.0000 mL | Freq: Once | INTRAVENOUS | Status: AC
  Administered 2020-01-09: 50 mL via INTRAVENOUS

## 2020-01-09 MED ORDER — ENOXAPARIN SODIUM 40 MG/0.4ML ~~LOC~~ SOLN
40.0000 mg | SUBCUTANEOUS | Status: DC
  Filled 2020-01-09: qty 0.4

## 2020-01-09 MED ORDER — ONDANSETRON HCL 4 MG PO TABS: 4.0000 mg | ORAL_TABLET | Freq: Four times a day (QID) | ORAL | Status: DC | PRN

## 2020-01-09 MED ORDER — LEVOTHYROXINE SODIUM 50 MCG PO TABS
75.0000 ug | ORAL_TABLET | Freq: Every day | ORAL | Status: DC
  Administered 2020-01-10: 06:00:00 75 ug via ORAL
  Filled 2020-01-09: qty 2

## 2020-01-09 MED ORDER — FERROUS SULFATE 325 (65 FE) MG PO TABS
325.0000 mg | ORAL_TABLET | Freq: Every day | ORAL | Status: DC
  Administered 2020-01-10: 10:00:00 325 mg via ORAL
  Filled 2020-01-09 (×2): qty 1

## 2020-01-09 MED ORDER — INSULIN ASPART 100 UNIT/ML ~~LOC~~ SOLN: 0.0000 [IU] | Freq: Three times a day (TID) | SUBCUTANEOUS | Status: DC

## 2020-01-09 MED ORDER — TRAZODONE HCL 50 MG PO TABS: 25.0000 mg | ORAL_TABLET | Freq: Every evening | ORAL | Status: DC | PRN

## 2020-01-09 MED ORDER — DEXTROSE 50 % IV SOLN
INTRAVENOUS | Status: AC
  Administered 2020-01-09: 21:00:00 50 mL via INTRAVENOUS
  Filled 2020-01-09: qty 50

## 2020-01-09 MED ORDER — BUPROPION HCL ER (XL) 150 MG PO TB24
300.0000 mg | ORAL_TABLET | Freq: Every day | ORAL | Status: DC
  Administered 2020-01-10: 10:00:00 300 mg via ORAL
  Filled 2020-01-09: qty 2

## 2020-01-09 MED ORDER — LACTATED RINGERS IV BOLUS: 1000.0000 mL | Freq: Once | INTRAVENOUS | Status: DC

## 2020-01-09 MED ORDER — MAGNESIUM HYDROXIDE 400 MG/5ML PO SUSP: 30.0000 mL | Freq: Every day | ORAL | Status: DC | PRN

## 2020-01-09 MED ORDER — DEXTROSE 10 % IV SOLN: INTRAVENOUS | Status: DC

## 2020-01-09 MED ORDER — DEXTROSE 50 % IV SOLN
INTRAVENOUS | Status: AC
  Filled 2020-01-09: qty 50

## 2020-01-09 MED ORDER — DEXTROSE 50 % IV SOLN: 50.0000 mL | Freq: Once | INTRAVENOUS | Status: AC

## 2020-01-09 MED ORDER — SODIUM CHLORIDE 0.9 % IV BOLUS
1000.0000 mL | Freq: Once | INTRAVENOUS | Status: AC
  Administered 2020-01-09: 22:00:00 1000 mL via INTRAVENOUS

## 2020-01-09 MED ORDER — LISINOPRIL 5 MG PO TABS
5.0000 mg | ORAL_TABLET | Freq: Every day | ORAL | Status: DC
  Administered 2020-01-10: 10:00:00 5 mg via ORAL
  Filled 2020-01-09: qty 1

## 2020-01-09 NOTE — ED Triage Notes (Addendum)
Pt arrives from home via ems -- per a friend pt found unresponsive -- per first responders pt BGL 24 - pt subsequently administered 246ml D10; repeat BGL 216 pta -- 18g L AC.  EMS unable to check vitals due to patient combative behavior.  Pt lives alone and per ems pt home is unkempt with several cats not being cared for -- friend contact [phone] -[place] Else  -- BGL upon arrival 11

## 2020-01-09 NOTE — ED Provider Notes (Signed)
[facility] Emergency Department Provider Note  ____________________________________________  Time seen: Approximately 9:19 PM  I have reviewed the triage vital signs and the nursing notes.   HISTORY  Chief Complaint Hypoglycemia    Level 5 Caveat: Portions of the History and Physical including HPI and review of systems are unable to be completely obtained due to patient being a poor historian   HPI Abigail Walker is a 74 y.o. female with a past history of GERD, hypothyroidism, CKD, diabetes, hypertension who was brought to the ED by EMS due to being found unresponsive in her home.  First responders reported a fingerstick glucose of 24 and they gave IV dextrose.  Patient then was confused and combative.  Currently patient is calm, complains of pain in the left arm but otherwise states she feels fine.  Unable to offer any additional history or recollection of recent events.      Past Medical History:  Diagnosis Date  . Acid reflux 06/16/2013  . Adult hypothyroidism 06/16/2013   Last Assessment & Plan:  Tsh and energy have been stable.    . Benign hypertension 06/16/2013   Last Assessment & Plan:  Patients blood pressure has seemingly been controlled without significant side effects such as dizziness or slow heart rate.     . Cancer ([place]) skin  . Sherran Needs syndrome   . Chronic kidney disease   . Depression   . Diabetes mellitus without complication ([place])   . GERD (gastroesophageal reflux disease)   . HBV (hepatitis B virus) infection   . High blood pressure   . Hives    food related  . Hypothyroidism   . Lichen planus   . Mixed incontinence   . Obstructive apnea 06/16/2013  . Sleep apnea   . Thyroid disease      Patient Active Problem List   Diagnosis Date Noted  . Hypoglycemia 01/09/2020  . Fall   . Hyponatremia   . Hyperglycemia 12/05/2019  . Acute lower UTI 12/05/2019  . Depression   . Lumbar compression fracture ([place])   .  Hyperlipidemia 08/13/2019  . Hypercalcemia 05/01/2019  . Leukopenia 01/17/2019  . Stage 3 chronic kidney disease ([place]) 01/17/2019  . Benign hypertensive kidney disease with chronic kidney disease 01/16/2019  . Hyposmolality and/or hyponatremia 01/16/2019  . Secondary hyperparathyroidism of renal origin ([place]) 01/16/2019  . Carotid artery disease ([place]) 08/10/2017  . Chronic midline low back pain without sciatica 03/16/2017  . Primary osteoarthritis of both hands 01/16/2017  . Benign essential tremor 08/12/2015  . MGUS (monoclonal gammopathy of unknown significance) 07/08/2015  . Mixed incontinence 11/09/2014  . Atrophic vaginitis 07/28/2014  . Encounter for general adult medical examination without abnormal findings 05/28/2014  . Major depression in remission ([place]) 11/27/2013  . Morbid obesity ([place]) 11/16/2013  . Microalbuminuria 08/06/2013  . Breath shortness 08/02/2013  . Acid reflux 06/16/2013  . Benign hypertension 06/16/2013  . Type 2 diabetes mellitus ([place]) 06/16/2013  . Adult hypothyroidism 06/16/2013  . Obstructive apnea 06/16/2013  . Hyperlipidemia associated with type 2 diabetes mellitus ([place]) 06/16/2013  . Oral lichen planus [id number]  . Lichen planus [id number]     Past Surgical History:  Procedure Laterality Date  . CATARACT EXTRACTION Bilateral   . ESOPHAGOGASTRODUODENOSCOPY (EGD) WITH PROPOFOL N/A 06/08/2015   Procedure: ESOPHAGOGASTRODUODENOSCOPY (EGD) WITH PROPOFOL;  Surgeon: Hulen Luster, MD;  Location: [facility] ENDOSCOPY;  Service: Gastroenterology;  Laterality: N/A;  . HEMORRHOID SURGERY    . IMPLANTABLE CONTACT LENS IMPLANTATION    .  TONSILLECTOMY    . TUBAL LIGATION       Prior to Admission medications   Medication Sig Start Date End Date Taking? Authorizing Provider  acetaminophen (TYLENOL) 500 MG tablet Take 500 mg by mouth every 6 (six) hours as needed for moderate pain.    [provider]  buPROPion (WELLBUTRIN XL) 150 MG 24 hr tablet Take 3  tablet po QD as directed    [provider]  clobetasol ointment (TEMOVATE) 0.05 % Apply as directed to affected areas groin twice a day. 11/19/19   Brendolyn Patty, MD  ferrous sulfate 325 (65 FE) MG tablet Take 325 mg by mouth daily with breakfast.    [provider]  hydrocortisone cream 1 % Apply 1 application topically as needed for itching.    [provider]  insulin NPH-regular Human (NOVOLIN 70/30) (70-30) 100 UNIT/ML injection Inject 16 units before breakfast and 26 units before supper as directed 11/13/13   [provider]  insulin NPH-regular Human (NOVOLIN 70/30) (70-30) 100 UNIT/ML injection INJECT 30 UNITS SUBCUTANEOUSLY TWICE DAILY BEFORE A MEAL 11/15/18   [provider]  Insulin Syringe-Needle U-100 (INSULIN SYRINGE 1CC/31GX5/16") 31G X 5/16" 1 ML MISC USE ONE SYRINGE TWICE DAILY AS DIRECTED WITH INSULIN VIALS 05/26/14   [provider]  Iodoquinol-HC-Aloe Polysacch (ALCORTIN A) 1-2-1 % GEL Apply 1 application topically at bedtime. 11/11/16   [provider]  levothyroxine (SYNTHROID, LEVOTHROID) 75 MCG tablet TAKE ONE TABLET BY MOUTH ONCE DAILY 05/21/14   [provider]  lisinopril (ZESTRIL) 5 MG tablet  03/30/19   [provider]  neomycin-bacitracin-polymyxin (NEOSPORIN) ointment Apply 1 application topically as needed for wound care.    [provider]  nystatin cream (MYCOSTATIN) Apply 1 application topically as needed. 01/08/14   [provider]  PARoxetine (PAXIL) 40 MG tablet Take 40 mg by mouth every morning.     [provider]  pravastatin (PRAVACHOL) 80 MG tablet Take 80 mg by mouth daily.  04/20/15   [provider]  TEA TREE OIL EX Apply topically as needed.    [provider]  torsemide (DEMADEX) 10 MG tablet Take 10 mg by mouth daily.  04/03/19   [provider]  Galva as needed. [facility] antibiotic and pain relief cream    [provider]     Allergies Other, Sulfa antibiotics, Amoxicillin-pot clavulanate, and Hydrocortisone-iodoquinol  [hydrocortisone-iodoquinol]   Family History  Problem Relation Age of Onset  . Heart disease Father   . Cancer Mother   . Kidney failure Maternal Aunt     Social History Social History   Tobacco Use  . Smoking status: Former Smoker    Quit date: 07/27/1993    Years since quitting: 26.4  . Smokeless tobacco: Never Used  . Tobacco comment: quit 20 years  Vaping Use  . Vaping Use: Never used  Substance Use Topics  . Alcohol use: No    Alcohol/week: 0.0 standard drinks  . Drug use: No    Review of Systems Level 5 Caveat: Portions of the History and Physical including HPI and review of systems are unable to be completely obtained due to patient being a poor historian   Constitutional:   No known fever.  Positive chills ENT:   No rhinorrhea. Cardiovascular:   No chest pain or syncope. Respiratory:   No dyspnea or cough. Gastrointestinal:   Negative for abdominal pain, vomiting and diarrhea.  Musculoskeletal: Left upper arm pain ____________________________________________  PHYSICAL EXAM:  VITAL SIGNS: ED Triage Vitals  Enc Vitals Group     BP 01/09/20 2038 (!) 161/83     Pulse --      Resp --      Temp --      Temp src --      SpO2 01/09/20 2038 100 %     Weight 01/09/20 2045 126 lb 1.7 oz (57.2 kg)     Height 01/09/20 2045 5\' 1"  (1.549 m)     Head Circumference --      Peak Flow --      Pain Score 01/09/20 2043 6     Pain Loc --      Pain Edu? --      Excl. in [place]? --     Vital signs reviewed, nursing assessments reviewed.   Constitutional:   Alert and oriented. Non-toxic appearance. Eyes:   Conjunctivae are normal. EOMI. PERRL. ENT      Head:   Normocephalic and atraumatic.      Nose:   No congestion/rhinnorhea.       Mouth/Throat:   Dry mucous membranes, no pharyngeal erythema. No peritonsillar mass.       Neck:   No meningismus. Full  ROM. Hematological/Lymphatic/Immunilogical:   No cervical lymphadenopathy. Cardiovascular:   RRR. Symmetric bilateral radial and DP pulses.  No murmurs. Cap refill less than 2 seconds. Respiratory:   Normal respiratory effort without tachypnea/retractions. Breath sounds are clear and equal bilaterally. No wheezes/rales/rhonchi. Gastrointestinal:   Soft with mild generalized abdominal tenderness. Non distended. There is no CVA tenderness.  No rebound, rigidity, or guarding.  Musculoskeletal:   Normal range of motion in all extremities. No joint effusions.  Tenderness of the right hip.  No edema. Neurologic:   Normal speech and language.  Motor grossly intact. No acute focal neurologic deficits are appreciated.  Skin:    Skin is warm, dry and intact. No rash noted.  No petechiae, purpura, or bullae.  ____________________________________________    LABS (pertinent positives/negatives) (all labs ordered are listed, but only abnormal results are displayed) Labs Reviewed  COMPREHENSIVE METABOLIC PANEL - Abnormal; Notable for the following components:      Result Value   Glucose, Bld 51 (*)    Creatinine, Ser 1.09 (*)    GFR, Estimated 53 (*)    All other components within normal limits  LACTIC ACID, PLASMA - Abnormal; Notable for the following components:   Lactic Acid, Venous 2.5 (*)    All other components within normal limits  BLOOD GAS, VENOUS - Abnormal; Notable for the following components:   pCO2, Ven 62 (*)    Bicarbonate 31.2 (*)    Acid-Base Excess 3.0 (*)    All other components within normal limits  CBG MONITORING, ED - Abnormal; Notable for the following components:   Glucose-Capillary 115 (*)    All other components within normal limits  RESP PANEL BY RT-PCR (FLU A&B, COVID) ARPGX2  LIPASE, BLOOD  CBC WITH DIFFERENTIAL/PLATELET  CK  URINALYSIS, COMPLETE (UACMP) WITH MICROSCOPIC  LACTIC ACID, PLASMA  TSH  MAGNESIUM  T4, FREE  CBG MONITORING, ED    ____________________________________________   EKG  Interpreted by me Sinus rhythm rate of 94, left axis, left bundle branch block.  No acute ischemic changes.  Prolonged QTC of 563 ms.  ____________________________________________    RADIOLOGY  DG Chest 1 View  Result Date: 01/09/2020 CLINICAL DATA:  74 year old female with weakness and multiple falls. EXAM: CHEST  1 VIEW  COMPARISON:  Chest radiograph dated 12/05/2019 FINDINGS: No focal consolidation, pleural effusion, pneumothorax. The cardiac silhouette is within limits. Atherosclerotic calcification of the aortic arch. No acute osseous pathology. IMPRESSION: No active disease. Electronically Signed   By: Anner Crete M.D.   On: 01/09/2020 21:58   CT HEAD WO CONTRAST  Result Date: 01/09/2020 CLINICAL DATA:  Found unresponsive. EXAM: CT HEAD WITHOUT CONTRAST TECHNIQUE: Contiguous axial images were obtained from the base of the skull through the vertex without intravenous contrast. COMPARISON:  December 05, 2019 FINDINGS: Brain: There is mild cerebral atrophy with widening of the extra-axial spaces and ventricular dilatation. There are areas of decreased attenuation within the white matter tracts of the supratentorial brain, consistent with microvascular disease changes. Vascular: No hyperdense vessel or unexpected calcification. Skull: Normal. Negative for fracture or focal lesion. Sinuses/Orbits: No acute finding. Other: None. IMPRESSION: 1. Generalized cerebral atrophy. 2. No acute intracranial abnormality. Electronically Signed   By: Virgina Norfolk M.D.   On: 01/09/2020 22:03   DG Humerus Left  Result Date: 01/09/2020 CLINICAL DATA:  Multiple falls. EXAM: LEFT HUMERUS - 2+ VIEW COMPARISON:  None. FINDINGS: There is no evidence of fracture or other focal bone lesions. Soft tissues are unremarkable. IMPRESSION: Negative. Electronically Signed   By: Virgina Norfolk M.D.   On: 01/09/2020 21:59   DG Hip Unilat W or Wo Pelvis  2-3 Views Right  Result Date: 01/09/2020 CLINICAL DATA:  Multiple falls. EXAM: DG HIP (WITH OR WITHOUT PELVIS) 2-3V RIGHT COMPARISON:  None. FINDINGS: There is no evidence of hip fracture or dislocation. There is no evidence of arthropathy or other focal bone abnormality. IMPRESSION: Negative. Electronically Signed   By: Virgina Norfolk M.D.   On: 01/09/2020 22:00    ____________________________________________   PROCEDURES Procedures  ____________________________________________  DIFFERENTIAL DIAGNOSIS   Pneumonia, hip fracture, intracranial hemorrhage, UTI, dehydration, electrolyte normality, viral illness  CLINICAL IMPRESSION / ASSESSMENT AND PLAN / ED COURSE  Medications ordered in the ED: Medications  dextrose 50 % solution 50 mL (50 mLs Intravenous Given 01/09/20 2124)  sodium chloride 0.9 % bolus 1,000 mL (1,000 mLs Intravenous New Bag/Given 01/09/20 2200)    Pertinent labs & imaging results that were available during my care of the patient were reviewed by me and considered in my medical decision making (see chart for details).   BOPHA FALU was evaluated in Emergency Department on 01/09/2020 for the symptoms described in the history of present illness. She was evaluated in the context of the global COVID-19 pandemic, which necessitated consideration that the patient might be at risk for infection with the SARS-CoV-2 virus that causes COVID-19. Institutional protocols and algorithms that pertain to the evaluation of patients at risk for COVID-19 are in a state of rapid change based on information released by regulatory bodies including the CDC and federal and state organizations. These policies and algorithms were followed during the patient's care in the ED.   Patient presents with generalized weakness and chills, found to be hypothermic with a temperature of 94 degrees.  Will give warm fluids and Bair hugger, check lactate VBG labs x-rays, CT head.  Clinical Course as  of 01/09/20 2239  Thu Jan 09, 2020  2237 Lactate elevated at 2.5.  The rest of the lab panel is reassuring, normal white count.  She is hypothermic, and with use of long-acting insulin and initial blood sugar of 26, she will need overnight observation to ensure stability.  TSH still pending, she does have history of  hypothermia which could cause similar presentation [PS]  2238 At present there is no sign of infectious source, and I doubt sepsis.  I think her hypothermia is due to hypoglycemia and not sepsis.  Elevated lactate is due to dehydration. [PS]    Clinical Course User Index [PS] Carrie Mew, MD     ____________________________________________   FINAL CLINICAL IMPRESSION(S) / ED DIAGNOSES    Final diagnoses:  Hypoglycemia due to type 2 diabetes mellitus ([place])  Hypothermia, initial encounter  Dehydration     ED Discharge Orders    None      Portions of this note were generated with dragon dictation software. Dictation errors may occur despite best attempts at proofreading.   Carrie Mew, MD 01/09/20 2239

## 2020-01-09 NOTE — ED Notes (Signed)
Pt now awake and alert to person and place-- cooperative with staff.

## 2020-01-09 NOTE — ED Notes (Signed)
Admitting hospitalist at bedside -- provider aware of current rectal temp 95.2Fand current BGL 65

## 2020-01-09 NOTE — H&P (Signed)
Abigail Walker   PATIENT NAME: Abigail Walker    MR#:  [id number]  DATE OF BIRTH:  1945/12/25  DATE OF ADMISSION:  01/09/2020  PRIMARY CARE PHYSICIAN: Kirk Ruths, MD   REQUESTING/REFERRING PHYSICIAN: Brenton Grills, MD  CHIEF COMPLAINT:   Chief Complaint  Patient presents with  . Hypoglycemia    HISTORY OF PRESENT ILLNESS:  Abigail Walker  is a 74 y.o. Caucasian female with a known history of type 2 diabetes mellitus on Novolin 70/30, essential hypertension, GERD, depression and hypothyroidism, who presented to the emergency room with acute onset of unresponsiveness.  She was found by EMS to have a blood glucose of 24.  She was given 250 mL of IV D10 and blood glucose was up to 216.  By the time she came to the ER was down again to 70.  She was given an amp of D50 and it went up to 150 then later on dropped to 65.  She admitted to associated diaphoresis with her hypoglycemia.  No nausea or vomiting or diarrhea.  She stated that she had a fall while trying to get to the kitchen with mild left shoulder and hip pain.  She denies any chest pain or palpitations, presyncope or syncope.  No paresthesias or focal muscle weakness.  She denies any cough or wheezing or hemoptysis.  She has been having mild nasal and sinus congestion with postnasal drip.  She was noted to be hypothermic and was placed on a Retail banker.  Upon presentation to the emergency room, Temperature was initially 94.3 and blood pressure was 161/83 with otherwise normal vital signs.  Labs revealed a blood glucose of 51.  Lactic acid was 2.5.  Venous blood gas showed a pH 7.31 with bicarbonate of 31.2.  CBC was unremarkable.  Influenza antigens and COVID-19 PCR came back negative.  TSH was 11.3.  Chest x-ray showed no acute cardiopulmonary disease.  Left humerus x-ray showed no acute abnormalities.  Right hip x-ray was unremarkable. Noncontrasted CT scan revealed cortical atrophy with no acute intracranial  normalities.  The patient was placed on IV D10W at 100 mill per hour and will be admitted to an observation medical monitored bed for further evaluation and management. PAST MEDICAL HISTORY:   Past Medical History:  Diagnosis Date  . Acid reflux 06/16/2013  . Adult hypothyroidism 06/16/2013   Last Assessment & Plan:  Tsh and energy have been stable.    . Benign hypertension 06/16/2013   Last Assessment & Plan:  Patients blood pressure has seemingly been controlled without significant side effects such as dizziness or slow heart rate.     . Cancer ([place]) skin  . Sherran Needs syndrome   . Chronic kidney disease   . Depression   . Diabetes mellitus without complication ([place])   . GERD (gastroesophageal reflux disease)   . HBV (hepatitis B virus) infection   . High blood pressure   . Hives    food related  . Hypothyroidism   . Lichen planus   . Mixed incontinence   . Obstructive apnea 06/16/2013  . Sleep apnea   . Thyroid disease     PAST SURGICAL HISTORY:   Past Surgical History:  Procedure Laterality Date  . CATARACT EXTRACTION Bilateral   . ESOPHAGOGASTRODUODENOSCOPY (EGD) WITH PROPOFOL N/A 06/08/2015   Procedure: ESOPHAGOGASTRODUODENOSCOPY (EGD) WITH PROPOFOL;  Surgeon: Hulen Luster, MD;  Location: [facility] ENDOSCOPY;  Service: Gastroenterology;  Laterality: N/A;  . HEMORRHOID SURGERY    .  IMPLANTABLE CONTACT LENS IMPLANTATION    . TONSILLECTOMY    . TUBAL LIGATION      SOCIAL HISTORY:   Social History   Tobacco Use  . Smoking status: Former Smoker    Quit date: 07/27/1993    Years since quitting: 26.4  . Smokeless tobacco: Never Used  . Tobacco comment: quit 20 years  Substance Use Topics  . Alcohol use: No    Alcohol/week: 0.0 standard drinks    FAMILY HISTORY:   Family History  Problem Relation Age of Onset  . Heart disease Father   . Cancer Mother   . Kidney failure Maternal Aunt     DRUG ALLERGIES:   Allergies  Allergen Reactions  . Other     Other  reaction(s): Other (See Comments), Other (See Comments) CLINDYAMYCIN = UNKNOWN CLINDYAMYCIN = UNKNOWN   . Sulfa Antibiotics Nausea And Vomiting  . Amoxicillin-Pot Clavulanate Rash  . Hydrocortisone-Iodoquinol  [Hydrocortisone-Iodoquinol] Rash    REVIEW OF SYSTEMS:   ROS As per history of present illness. All pertinent systems were reviewed above. Constitutional, HEENT, cardiovascular, respiratory, GI, GU, musculoskeletal, neuro, psychiatric, endocrine, integumentary and hematologic systems were reviewed and are otherwise negative/unremarkable except for positive findings mentioned above in the HPI.   MEDICATIONS AT HOME:   Prior to Admission medications   Medication Sig Start Date End Date Taking? Authorizing Provider  acetaminophen (TYLENOL) 500 MG tablet Take 500 mg by mouth every 6 (six) hours as needed for moderate pain.    [provider]  buPROPion (WELLBUTRIN XL) 150 MG 24 hr tablet Take 3 tablet po QD as directed    [provider]  clobetasol ointment (TEMOVATE) 0.05 % Apply as directed to affected areas groin twice a day. 11/19/19   Brendolyn Patty, MD  ferrous sulfate 325 (65 FE) MG tablet Take 325 mg by mouth daily with breakfast.    [provider]  hydrocortisone cream 1 % Apply 1 application topically as needed for itching.    [provider]  insulin NPH-regular Human (NOVOLIN 70/30) (70-30) 100 UNIT/ML injection Inject 16 units before breakfast and 26 units before supper as directed 11/13/13   [provider]  insulin NPH-regular Human (NOVOLIN 70/30) (70-30) 100 UNIT/ML injection INJECT 30 UNITS SUBCUTANEOUSLY TWICE DAILY BEFORE A MEAL 11/15/18   [provider]  Insulin Syringe-Needle U-100 (INSULIN SYRINGE 1CC/31GX5/16") 31G X 5/16" 1 ML MISC USE ONE SYRINGE TWICE DAILY AS DIRECTED WITH INSULIN VIALS 05/26/14   [provider]  Iodoquinol-HC-Aloe Polysacch (ALCORTIN A) 1-2-1 % GEL Apply 1 application topically at  bedtime. 11/11/16   [provider]  levothyroxine (SYNTHROID, LEVOTHROID) 75 MCG tablet TAKE ONE TABLET BY MOUTH ONCE DAILY 05/21/14   [provider]  lisinopril (ZESTRIL) 5 MG tablet  03/30/19   [provider]  neomycin-bacitracin-polymyxin (NEOSPORIN) ointment Apply 1 application topically as needed for wound care.    [provider]  nystatin cream (MYCOSTATIN) Apply 1 application topically as needed. 01/08/14   [provider]  PARoxetine (PAXIL) 40 MG tablet Take 40 mg by mouth every morning.     [provider]  pravastatin (PRAVACHOL) 80 MG tablet Take 80 mg by mouth daily.  04/20/15   [provider]  TEA TREE OIL EX Apply topically as needed.    [provider]  torsemide (DEMADEX) 10 MG tablet Take 10 mg by mouth daily.  04/03/19   [provider]  [place] as needed. CVS antibiotic and pain  relief cream    [provider]      VITAL SIGNS:  Blood pressure (!) 183/82, pulse 89, temperature (!) 95.2 F (35.1 C), temperature source Rectal, resp. rate 14, height 5\' 1"  (1.549 m), weight 57.2 kg, SpO2 99 %.  PHYSICAL EXAMINATION:  Physical Exam  GENERAL:  74 y.o.-year-old Caucasian female patient lying in the bed with no acute distress.  She has an overlying bear hugger. EYES: Pupils equal, round, reactive to light and accommodation. No scleral icterus. Extraocular muscles intact.  HEENT: Head atraumatic, normocephalic. Oropharynx and nasopharynx clear.  NECK:  Supple, no jugular venous distention. No thyroid enlargement, no tenderness.  LUNGS: Normal breath sounds bilaterally, no wheezing, rales,rhonchi or crepitation. No use of accessory muscles of respiration.  CARDIOVASCULAR: Regular rate and rhythm, S1, S2 normal. No murmurs, rubs, or gallops.  ABDOMEN: Soft, nondistended, nontender. Bowel sounds present. No organomegaly or mass.  EXTREMITIES: No pedal edema, cyanosis, or clubbing.   NEUROLOGIC: Cranial nerves II through XII are intact. Muscle strength 5/5 in all extremities. Sensation intact. Gait not checked.  PSYCHIATRIC: The patient is alert and oriented x 3.  Normal affect and good eye contact. SKIN: Cold with no obvious rash, lesion, or ulcer.   LABORATORY PANEL:   CBC Recent Labs  Lab 01/09/20 2055  WBC 5.8  HGB 14.5  HCT 43.1  PLT 210   ------------------------------------------------------------------------------------------------------------------  Chemistries  Recent Labs  Lab 01/09/20 2055  NA 141  K 4.9  CL 101  CO2 26  GLUCOSE 51*  BUN 20  CREATININE 1.09*  CALCIUM 10.0  AST 39  ALT 18  ALKPHOS 80  BILITOT 1.1   ------------------------------------------------------------------------------------------------------------------  Cardiac Enzymes No results for input(s): TROPONINI in the last 168 hours. ------------------------------------------------------------------------------------------------------------------  RADIOLOGY:  DG Chest 1 View  Result Date: 01/09/2020 CLINICAL DATA:  74 year old female with weakness and multiple falls. EXAM: CHEST  1 VIEW COMPARISON:  Chest radiograph dated 12/05/2019 FINDINGS: No focal consolidation, pleural effusion, pneumothorax. The cardiac silhouette is within limits. Atherosclerotic calcification of the aortic arch. No acute osseous pathology. IMPRESSION: No active disease. Electronically Signed   By: Anner Crete M.D.   On: 01/09/2020 21:58   CT HEAD WO CONTRAST  Result Date: 01/09/2020 CLINICAL DATA:  Found unresponsive. EXAM: CT HEAD WITHOUT CONTRAST TECHNIQUE: Contiguous axial images were obtained from the base of the skull through the vertex without intravenous contrast. COMPARISON:  December 05, 2019 FINDINGS: Brain: There is mild cerebral atrophy with widening of the extra-axial spaces and ventricular dilatation. There are areas of decreased attenuation within the white matter tracts  of the supratentorial brain, consistent with microvascular disease changes. Vascular: No hyperdense vessel or unexpected calcification. Skull: Normal. Negative for fracture or focal lesion. Sinuses/Orbits: No acute finding. Other: None. IMPRESSION: 1. Generalized cerebral atrophy. 2. No acute intracranial abnormality. Electronically Signed   By: Virgina Norfolk M.D.   On: 01/09/2020 22:03   DG Humerus Left  Result Date: 01/09/2020 CLINICAL DATA:  Multiple falls. EXAM: LEFT HUMERUS - 2+ VIEW COMPARISON:  None. FINDINGS: There is no evidence of fracture or other focal bone lesions. Soft tissues are unremarkable. IMPRESSION: Negative. Electronically Signed   By: Virgina Norfolk M.D.   On: 01/09/2020 21:59   DG Hip Unilat W or Wo Pelvis 2-3 Views Right  Result Date: 01/09/2020 CLINICAL DATA:  Multiple falls. EXAM: DG HIP (WITH OR WITHOUT PELVIS) 2-3V RIGHT COMPARISON:  None. FINDINGS: There is no evidence of hip fracture or dislocation. There is no evidence  of arthropathy or other focal bone abnormality. IMPRESSION: Negative. Electronically Signed   By: Virgina Norfolk M.D.   On: 01/09/2020 22:00      IMPRESSION AND PLAN:   1.  Hypoglycemia with type 2 diabetes mellitus likely brittle given recent elevated hemoglobin A1c.. -The patient will be placed on observation and medical monitor bed. -We will hold off her Novolin 70/30 and Actos. -She will continue on IV D10W at 100 mill per hour for recurrent hypoglycemia. -We will closely monitor fingerstick blood glucose measures.  2.  Hypothermia.  This is of unclear etiology.  She had significant diaphoresis without hypoglycemia could have contributed partly. -Her urinalysis currently pending.  Her chest x-ray came back negative. -I asked her about any problems with heat at her home and she denied. -We will continue rewarming with bear hugger.  3.   Hypothyroidism likely uncontrolled with elevated TSH. -We will check TSH level and continue  Synthroid. -We will increase its dose 100 mcg and check free T3 and free T4  4.  Essential hypertension. -Continue Zestril.  5.  Dyslipidemia. -We will continue statin therapy.  6.  DVT prophylaxis. -Subcutaneous Lovenox    All the records are reviewed and case discussed with ED provider. The plan of care was discussed in details with the patient (and family). I answered all questions. The patient agreed to proceed with the above mentioned plan. Further management will depend upon hospital course.   CODE STATUS: Full code  Status is: Observation  The patient remains OBS appropriate and will d/c before 2 midnights.  Dispo: The patient is from: Home              Anticipated d/c is to: Home              Anticipated d/c date is: 1 day              Patient currently is not medically stable to d/c.    TOTAL TIME TAKING CARE OF THIS PATIENT: 55 minutes.    Christel Mormon M.D on 01/09/2020 at 10:38 PM  [facility]   From 7 PM-7 AM, contact night-coverage [email or web address]  CC: Primary care physician; Kirk Ruths, MD

## 2020-01-10 ENCOUNTER — Encounter: Payer: Self-pay | Admitting: Family Medicine

## 2020-01-10 DIAGNOSIS — E11649 Type 2 diabetes mellitus with hypoglycemia without coma: Secondary | ICD-10-CM | POA: Diagnosis not present

## 2020-01-10 DIAGNOSIS — E860 Dehydration: Secondary | ICD-10-CM | POA: Diagnosis not present

## 2020-01-10 DIAGNOSIS — T68XXXA Hypothermia, initial encounter: Secondary | ICD-10-CM | POA: Diagnosis not present

## 2020-01-10 DIAGNOSIS — E162 Hypoglycemia, unspecified: Secondary | ICD-10-CM | POA: Diagnosis not present

## 2020-01-10 LAB — T4, FREE: Free T4: 0.88 ng/dL (ref 0.61–1.12)

## 2020-01-10 LAB — URINALYSIS, COMPLETE (UACMP) WITH MICROSCOPIC
Bacteria, UA: NONE SEEN
Bilirubin Urine: NEGATIVE
Glucose, UA: >=500 mg/dL — AB
Hgb urine dipstick: NEGATIVE
Ketones, ur: NEGATIVE mg/dL
Nitrite: NEGATIVE
Protein, ur: NEGATIVE mg/dL
Specific Gravity, Urine: 1.007 (ref 1.005–1.030)
pH: 9 — ABNORMAL HIGH (ref 5.0–8.0)

## 2020-01-10 LAB — CBC
HCT: 36.9 % (ref 36.0–46.0)
Hemoglobin: 12.5 g/dL (ref 12.0–15.0)
MCH: 30 pg (ref 26.0–34.0)
MCHC: 33.9 g/dL (ref 30.0–36.0)
MCV: 88.7 fL (ref 80.0–100.0)
Platelets: 186 10*3/uL (ref 150–400)
RBC: 4.16 MIL/uL (ref 3.87–5.11)
RDW: 12.5 % (ref 11.5–15.5)
WBC: 5.4 10*3/uL (ref 4.0–10.5)
nRBC: 0 % (ref 0.0–0.2)

## 2020-01-10 LAB — CBG MONITORING, ED
Glucose-Capillary: 216 mg/dL — ABNORMAL HIGH (ref 70–99)
Glucose-Capillary: 218 mg/dL — ABNORMAL HIGH (ref 70–99)
Glucose-Capillary: 251 mg/dL — ABNORMAL HIGH (ref 70–99)
Glucose-Capillary: 280 mg/dL — ABNORMAL HIGH (ref 70–99)

## 2020-01-10 LAB — BASIC METABOLIC PANEL
Anion gap: 8 (ref 5–15)
BUN: 15 mg/dL (ref 8–23)
CO2: 25 mmol/L (ref 22–32)
Calcium: 9.2 mg/dL (ref 8.9–10.3)
Chloride: 102 mmol/L (ref 98–111)
Creatinine, Ser: 0.94 mg/dL (ref 0.44–1.00)
GFR, Estimated: >60 mL/min (ref >60)
Glucose, Bld: 273 mg/dL — ABNORMAL HIGH (ref 70–99)
Potassium: 3.6 mmol/L (ref 3.5–5.1)
Sodium: 135 mmol/L (ref 135–145)

## 2020-01-10 LAB — HEMOGLOBIN A1C
Hgb A1c MFr Bld: 9.7 % — ABNORMAL HIGH (ref 4.8–5.6)
Mean Plasma Glucose: 231.69 mg/dL

## 2020-01-10 LAB — GLUCOSE, CAPILLARY: Glucose-Capillary: 206 mg/dL — ABNORMAL HIGH (ref 70–99)

## 2020-01-10 LAB — LACTIC ACID, PLASMA: Lactic Acid, Venous: 0.9 mmol/L (ref 0.5–1.9)

## 2020-01-10 MED ORDER — INSULIN ASPART 100 UNIT/ML ~~LOC~~ SOLN
0.0000 [IU] | SUBCUTANEOUS | Status: DC
  Administered 2020-01-10 (×2): 2 [IU] via SUBCUTANEOUS
  Filled 2020-01-10 (×2): qty 1

## 2020-01-10 MED ORDER — NOVOLIN 70/30 (70-30) 100 UNIT/ML ~~LOC~~ SUSP: 20.0000 [IU] | Freq: Two times a day (BID) | SUBCUTANEOUS | 11 refills | Status: DC

## 2020-01-10 NOTE — Progress Notes (Signed)
Discharge education reviewed and given to patient.  VSS, No s/s of distress.  Pt transported home via cab.

## 2020-01-10 NOTE — ED Notes (Signed)
Secure chat received from Alphonzo Grieve, RN to review handoff summery

## 2020-01-10 NOTE — Progress Notes (Addendum)
Inpatient Diabetes Program Recommendations  AACE/ADA: New Consensus Statement on Inpatient Glycemic Control   Target Ranges:  Prepandial:   less than 140 mg/dL      Peak postprandial:   less than 180 mg/dL (1-2 hours)      Critically ill patients:  140 - 180 mg/dL   Results for Abigail Walker, Abigail Walker (MRN [id number]) as of 01/10/2020 11:03  Ref. Range 01/09/2020 20:28 01/09/2020 20:50 01/09/2020 22:40 01/10/2020 00:15 01/10/2020 02:29 01/10/2020 05:47 01/10/2020 07:29  Glucose-Capillary Latest Ref Range: 70 - 99 mg/dL 70 115 (H) 65 (L) 216 (H) 280 (H) 251 (H) 218 (H)  Results for Abigail Walker, Abigail Walker (MRN [id number]) as of 01/10/2020 11:03  Ref. Range 12/05/2019 22:31  Hemoglobin A1C Latest Ref Range: 4.8 - 5.6 % 10.6 (H)   Review of Glycemic Control  Diabetes history: DM2 Outpatient Diabetes medications: 70/30 25 units QAM, 70/30 15 units QPM Current orders for Inpatient glycemic control: Novolog 0-6 units Q4H  Inpatient Diabetes Program Recommendations:    Insulin: Please consider ordering Lantus 12 units Q24H (based on 57.2 kg x 0.2 units).   HbgA1C: A1C 10.6% on 12/05/19. Inpatient diabetes coordinator spoke with patient on 12/06/19 during prior admission.  NOTE: In reviewing chart, noted patient found unresponsive and EMS noted initial glucose of 24 mg/dl. Appears hypoglycemia has resolved and glucose has been consistently over 200 mg/dl for the past 8 hours. In reviewing the chart noted patient sees H. Pasty Arch, NP at [facility]. Per last office visit note on 11/12/19, patient was prescribed 70/30 25 units QAM and 70/30 5-10 units QPM for DM control.  Noted consult for Diabetes Coordinator. Diabetes Coordinator is not on campus but available by pager from 8am to 5pm for questions or concerns. Attempted to call patient's room number but no answer.   Thanks, Barnie Alderman, RN, MSN, CDE Diabetes Coordinator Inpatient Diabetes Program [phone] (Team Pager from 8am to 5pm)

## 2020-01-10 NOTE — TOC Initial Note (Signed)
Transition of Care (TOC) - Initial/Assessment Note    Patient Details  Name: Abigail Walker MRN: [id number] Date of Birth: 07/02/1945  Transition of Care (TOC) CM/SW Contact:     D , RN Phone Number: 01/10/2020, 1:41 PM  Clinical Narrative:   RNCM met with patient to discharge planning and needs. Patient reports that she lives alone and is normally independent. She reports that she has recently not been feeling well but was not really sure why, she reports that she is normally independent. Patient is agreeable to home health being arranged for DM management and insulin teaching. Patient denies any other needs at this time.  RNCM reached out to Jason with [facility] and he accepted referral for home health.           Expected Discharge Plan: Home w Home Health Services Barriers to Discharge: No Barriers Identified   Patient Goals and CMS Choice        Expected Discharge Plan and Services Expected Discharge Plan: Home w Home Health Services       Living arrangements for the past 2 months: Single Family Home Expected Discharge Date: 01/10/20                                    Prior Living Arrangements/Services Living arrangements for the past 2 months: Single Family Home Lives with:: Self Patient language and need for interpreter reviewed:: Yes Do you feel safe going back to the place where you live?: Yes      Need for Family Participation in Patient Care: Yes (Comment) Care giver support system in place?: Yes (comment)   Criminal Activity/Legal Involvement Pertinent to Current Situation/Hospitalization: No - Comment as needed  Activities of Daily Living Home Assistive Devices/Equipment: Cane (specify quad or straight) ADL Screening (condition at time of admission) Patient's cognitive ability adequate to safely complete daily activities?: Yes Is the patient deaf or have difficulty hearing?: No Does the patient have difficulty seeing, even when wearing  glasses/contacts?: No Does the patient have difficulty concentrating, remembering, or making decisions?: No Patient able to express need for assistance with ADLs?: Yes Does the patient have difficulty dressing or bathing?: No Independently performs ADLs?: Yes (appropriate for developmental age) Does the patient have difficulty walking or climbing stairs?: No Weakness of Legs: None Weakness of Arms/Hands: None  Permission Sought/Granted                  Emotional Assessment Appearance:: Appears stated age   Affect (typically observed): Appropriate,Calm Orientation: : Oriented to Self,Oriented to Place,Oriented to  Time,Oriented to Situation Alcohol / Substance Use: Not Applicable Psych Involvement: No (comment)  Admission diagnosis:  Dehydration [E86.0] Hypoglycemia [E16.2] Hypothermia, initial encounter [T68.XXXA] Hypoglycemia due to type 2 diabetes mellitus (HCC) [E11.649] Patient Active Problem List   Diagnosis Date Noted  . Hypoglycemia due to type 2 diabetes mellitus (HCC)   . Hypothermia   . Dehydration   . Hypoglycemia 01/09/2020  . Fall   . Hyponatremia   . Hyperglycemia 12/05/2019  . Acute lower UTI 12/05/2019  . Depression   . Lumbar compression fracture (HCC)   . Hyperlipidemia 08/13/2019  . Hypercalcemia 05/01/2019  . Leukopenia 01/17/2019  . Stage 3 chronic kidney disease (HCC) 01/17/2019  . Benign hypertensive kidney disease with chronic kidney disease 01/16/2019  . Hyposmolality and/or hyponatremia 01/16/2019  . Secondary hyperparathyroidism of renal origin (HCC) 01/16/2019  . Carotid artery disease (  [place]) 08/10/2017  . Chronic midline low back pain without sciatica 03/16/2017  . Primary osteoarthritis of both hands 01/16/2017  . Benign essential tremor 08/12/2015  . MGUS (monoclonal gammopathy of unknown significance) 07/08/2015  . Mixed incontinence 11/09/2014  . Atrophic vaginitis 07/28/2014  . Encounter for general adult medical examination  without abnormal findings 05/28/2014  . Major depression in remission ([place]) 11/27/2013  . Morbid obesity ([place]) 11/16/2013  . Microalbuminuria 08/06/2013  . Breath shortness 08/02/2013  . Acid reflux 06/16/2013  . Benign hypertension 06/16/2013  . Type 2 diabetes mellitus ([place]) 06/16/2013  . Adult hypothyroidism 06/16/2013  . Obstructive apnea 06/16/2013  . Hyperlipidemia associated with type 2 diabetes mellitus ([place]) 06/16/2013  . Oral lichen planus 38/93/7342  . Lichen planus 87/68/1157   PCP:  Kirk Ruths, MD Pharmacy:   [facility] [address] [place]) [place] [address] Phone: [phone] Fax: [place] [place] [address] Phone: [phone] Fax: [phone]     Social Determinants of Health (SDOH) Interventions    Readmission Risk Interventions No flowsheet data found.

## 2020-01-10 NOTE — ED Notes (Signed)
Floor RN messaged via secure chat to inquire about questions

## 2020-01-10 NOTE — ED Notes (Signed)
bair hugger removed at this time -- pt temp now normalized -  98.2F oral

## 2020-01-10 NOTE — Discharge Instructions (Signed)
Hypoglycemia °Hypoglycemia is when the sugar (glucose) level in your blood is too low. Signs of low blood sugar may include: °· Feeling: °? Hungry. °? Worried or nervous (anxious). °? Sweaty and clammy. °? Confused. °? Dizzy. °? Sleepy. °? Sick to your stomach (nauseous). °· Having: °? A fast heartbeat. °? A headache. °? A change in your vision. °? Tingling or no feeling (numbness) around your mouth, lips, or tongue. °? Jerky movements that you cannot control (seizure). °· Having trouble with: °? Moving (coordination). °? Sleeping. °? Passing out (fainting). °? Getting upset easily (irritability). °Low blood sugar can happen to people who have diabetes and people who do not have diabetes. Low blood sugar can happen quickly, and it can be an emergency. °Treating low blood sugar °Low blood sugar is often treated by eating or drinking something sugary right away, such as: °· Fruit juice, 4-6 oz (120-150 mL). °· Regular soda (not diet soda), 4-6 oz (120-150 mL). °· Low-fat milk, 4 oz (120 mL). °· Several pieces of hard candy. °· Sugar or honey, 1 Tbsp (15 mL). °Treating low blood sugar if you have diabetes °If you can think clearly and swallow safely, follow the 15:15 rule: °· Take 15 grams of a fast-acting carb (carbohydrate). Talk with your doctor about how much you should take. °· Always keep a source of fast-acting carb with you, such as: °? Sugar tablets (glucose pills). Take 3-4 pills. °? 6-8 pieces of hard candy. °? 4-6 oz (120-150 mL) of fruit juice. °? 4-6 oz (120-150 mL) of regular (not diet) soda. °? 1 Tbsp (15 mL) honey or sugar. °· Check your blood sugar 15 minutes after you take the carb. °· If your blood sugar is still at or below 70 mg/dL (3.9 mmol/L), take 15 grams of a carb again. °· If your blood sugar does not go above 70 mg/dL (3.9 mmol/L) after 3 tries, get help right away. °· After your blood sugar goes back to normal, eat a meal or a snack within 1 hour. ° °Treating very low blood sugar °If your  blood sugar is at or below 54 mg/dL (3 mmol/L), you have very low blood sugar (severe hypoglycemia). This may also cause: °· Passing out. °· Jerky movements you cannot control (seizure). °· Losing consciousness (coma). °This is an emergency. Do not wait to see if the symptoms will go away. Get medical help right away. Call your local emergency services (911 in the [place] Do not drive yourself to the hospital. °If you have very low blood sugar and you cannot eat or drink, you may need a glucagon shot (injection). A family member or friend should learn how to check your blood sugar and how to give you a glucagon shot. Ask your doctor if you need to have a glucagon shot kit at home. °Follow these instructions at home: °General instructions °· Take over-the-counter and prescription medicines only as told by your doctor. °· Stay aware of your blood sugar as told by your doctor. °· Limit alcohol intake to no more than 1 drink a day for nonpregnant women and 2 drinks a day for men. One drink equals 12 oz of beer (355 mL), 5 oz of wine (148 mL), or 1½ oz of hard liquor (44 mL). °· Keep all follow-up visits as told by your doctor. This is important. °If you have diabetes: ° °· Follow your diabetes care plan as told by your doctor. Make sure you: °? Know the signs of low blood sugar. °?   Take your medicines as told. ? Follow your exercise and meal plan. ? Eat on time. Do not skip meals. ? Check your blood sugar as often as told by your doctor. Always check it before and after exercise. ? Follow your sick day plan when you cannot eat or drink normally. Make this plan ahead of time with your doctor.  Share your diabetes care plan with: ? Your work or school. ? People you live with.  Check your pee (urine) for ketones: ? When you are sick. ? As told by your doctor.  Carry a card or wear jewelry that says you have diabetes. Contact a doctor if:  You have trouble keeping your blood sugar in your target  range.  You have low blood sugar often. Get help right away if:  You still have symptoms after you eat or drink something sugary.  Your blood sugar is at or below 54 mg/dL (3 mmol/L).  You have jerky movements that you cannot control.  You pass out. These symptoms may be an emergency. Do not wait to see if the symptoms will go away. Get medical help right away. Call your local emergency services (911 in the [place] Do not drive yourself to the hospital. Summary  Hypoglycemia happens when the level of sugar (glucose) in your blood is too low.  Low blood sugar can happen to people who have diabetes and people who do not have diabetes. Low blood sugar can happen quickly, and it can be an emergency.  Make sure you know the signs of low blood sugar and know how to treat it.  Always keep a source of sugar (fast-acting carb) with you to treat low blood sugar. This information is not intended to replace advice given to you by your health care provider. Make sure you discuss any questions you have with your health care provider. Document Revised: 04/26/2018 Document Reviewed: 02/06/2015 Elsevier Patient Education  2020 [employer]  

## 2020-01-10 NOTE — Plan of Care (Signed)
  Problem: Health Behavior/Discharge Planning: Goal: Ability to manage health-related needs will improve Outcome: Progressing   Problem: Clinical Measurements: Goal: Diagnostic test results will improve Outcome: Progressing Goal: Respiratory complications will improve Outcome: Progressing Goal: Cardiovascular complication will be avoided Outcome: Progressing   

## 2020-01-11 NOTE — Progress Notes (Addendum)
Patient's TSH is elevated at 11.35.  She was taking 75 mcg.  I have called in 100 mcg of Synthroid once daily at her pharmacy at [facility].

## 2020-01-11 NOTE — Discharge Summary (Signed)
[address] at [place] NAME: Abigail Walker    MR#:  [id number]  DATE OF BIRTH:  Oct 27, 1945  DATE OF ADMISSION:  01/09/2020   ADMITTING PHYSICIAN: Christel Mormon, MD  DATE OF DISCHARGE: 01/10/2020  3:47 PM  PRIMARY CARE PHYSICIAN: Kirk Ruths, MD   ADMISSION DIAGNOSIS:  Dehydration [E86.0] Hypoglycemia [E16.2] Hypothermia, initial encounter [T68.XXXA] Hypoglycemia due to type 2 diabetes mellitus ([place]) [E11.649] DISCHARGE DIAGNOSIS:  Active Problems:   Hypoglycemia   Hypoglycemia due to type 2 diabetes mellitus ([place])   Hypothermia   Dehydration  SECONDARY DIAGNOSIS:   Past Medical History:  Diagnosis Date  . Acid reflux 06/16/2013  . Adult hypothyroidism 06/16/2013   Last Assessment & Plan:  Tsh and energy have been stable.    . Benign hypertension 06/16/2013   Last Assessment & Plan:  Patients blood pressure has seemingly been controlled without significant side effects such as dizziness or slow heart rate.     . Cancer ([place]) skin  . Sherran Needs syndrome   . Chronic kidney disease   . Depression   . Diabetes mellitus without complication ([place])   . GERD (gastroesophageal reflux disease)   . HBV (hepatitis B virus) infection   . High blood pressure   . Hives    food related  . Hypothyroidism   . Lichen planus   . Mixed incontinence   . Obstructive apnea 06/16/2013  . Sleep apnea   . Thyroid disease    HOSPITAL COURSE:  74 year old female with a known history of diabetes, hypertension, GERD, depression, hypothyroidism was admitted for acute onset of unresponsiveness thought to be due to hypoglycemia.  1.  Hypoglycemia with type 2 diabetes mellitus likely brittle given recent elevated hemoglobin A1c.. -Initially required dextrose IV but then her blood sugar remained elevated and controlled off of dextrose.  2.  Hypothermia.  This is of unclear etiology.  She had significant diaphoresis without hypoglycemia could have  contributed partly. -No obvious source of infection.  Her temperature was within normal range as an inpatient.  3.   Hypothyroidism likely uncontrolled with elevated TSH. -will need higher dose of Synthroid.  She was taking 75 mcg.  I have called in 100 mcg of Synthroid once daily at her pharmacy at [facility] although it is closed.  I have left message for them to fill updated dose of Synthroid and let the patient know for pickup  4.  Essential hypertension. -Continue Zestril.  5.  Dyslipidemia. -continue statin therapy.   DISCHARGE CONDITIONS:  Stable CONSULTS OBTAINED:   DRUG ALLERGIES:   Allergies  Allergen Reactions  . Other     Other reaction(s): Other (See Comments), Other (See Comments) CLINDYAMYCIN = UNKNOWN CLINDYAMYCIN = UNKNOWN   . Sulfa Antibiotics Nausea And Vomiting  . Hydrocortisone-Iodoquinol  [Hydrocortisone-Iodoquinol] Rash   DISCHARGE MEDICATIONS:   Allergies as of 01/10/2020      Reactions   Other    Other reaction(s): Other (See Comments), Other (See Comments) CLINDYAMYCIN = UNKNOWN CLINDYAMYCIN = UNKNOWN   Sulfa Antibiotics Nausea And Vomiting   Hydrocortisone-iodoquinol  [hydrocortisone-iodoquinol] Rash      Medication List    STOP taking these medications   magnesium oxide 400 MG tablet Commonly known as: MAG-OX     TAKE these medications   acetaminophen 500 MG tablet Commonly known as: TYLENOL Take 500 mg by mouth every 6 (six) hours as needed for moderate pain.   buPROPion 150  MG 24 hr tablet Commonly known as: WELLBUTRIN XL Take 450 mg by mouth at bedtime.   clobetasol ointment 0.05 % Commonly known as: TEMOVATE Apply as directed to affected areas groin twice a day.   clopidogrel 75 MG tablet Commonly known as: PLAVIX Take 75 mg by mouth daily.   docusate sodium 100 MG capsule Commonly known as: COLACE Take 100 mg by mouth daily.   ferrous sulfate 325 (65 FE) MG tablet Take 325 mg by mouth daily with breakfast.    hydrocortisone cream 1 % Apply 1 application topically as needed for itching.   INSULIN SYRINGE 1CC/31GX5/16" 31G X 5/16" 1 ML Misc USE ONE SYRINGE TWICE DAILY AS DIRECTED WITH INSULIN VIALS   levothyroxine 75 MCG tablet Commonly known as: SYNTHROID TAKE ONE TABLET BY MOUTH ONCE DAILY   lisinopril 5 MG tablet Commonly known as: ZESTRIL   neomycin-bacitracin-polymyxin ointment Commonly known as: NEOSPORIN Apply 1 application topically as needed for wound care.   NovoLIN 70/30 (70-30) 100 UNIT/ML injection Generic drug: insulin NPH-regular Human Inject 20 Units into the skin 2 (two) times daily with a meal.   nystatin cream Commonly known as: MYCOSTATIN Apply 1 application topically as needed.   PARoxetine 40 MG tablet Commonly known as: PAXIL Take 40 mg by mouth every morning.   rosuvastatin 40 MG tablet Commonly known as: CRESTOR Take 40 mg by mouth daily.   TEA TREE OIL EX Apply topically as needed.   torsemide 10 MG tablet Commonly known as: DEMADEX Take 10 mg by mouth daily.   UNABLE TO FIND as needed. [facility] antibiotic and pain relief cream   Vitamin D3 50 MCG (2000 UT) Tabs Take 2,000 Units by mouth daily at 12 noon.      DISCHARGE INSTRUCTIONS:   DIET:  Cardiac diet DISCHARGE CONDITION:  Stable ACTIVITY:  Activity as tolerated OXYGEN:  Home Oxygen: No.  Oxygen Delivery: room air DISCHARGE LOCATION:  home with home health PT, OT, RN  If you experience worsening of your admission symptoms, develop shortness of breath, life threatening emergency, suicidal or homicidal thoughts you must seek medical attention immediately by calling 911 or calling your MD immediately  if symptoms less severe.  You Must read complete instructions/literature along with all the possible adverse reactions/side effects for all the Medicines you take and that have been prescribed to you. Take any new Medicines after you have completely understood and accpet all the possible  adverse reactions/side effects.   Please note  You were cared for by a hospitalist during your hospital stay. If you have any questions about your discharge medications or the care you received while you were in the hospital after you are discharged, you can call the unit and asked to speak with the hospitalist on call if the hospitalist that took care of you is not available. Once you are discharged, your primary care physician will handle any further medical issues. Please note that NO REFILLS for any discharge medications will be authorized once you are discharged, as it is imperative that you return to your primary care physician (or establish a relationship with a primary care physician if you do not have one) for your aftercare needs so that they can reassess your need for medications and monitor your lab values.    On the day of Discharge:  VITAL SIGNS:  Blood pressure (!) 124/55, pulse 95, temperature 98.4 F (36.9 C), resp. rate 15, height 5\' 1"  (1.549 m), weight 57.2 kg, SpO2 98 %. PHYSICAL  EXAMINATION:  GENERAL:  74 y.o.-year-old patient lying in the bed with no acute distress.  EYES: Pupils equal, round, reactive to light and accommodation. No scleral icterus. Extraocular muscles intact.  HEENT: Head atraumatic, normocephalic. Oropharynx and nasopharynx clear.  NECK:  Supple, no jugular venous distention. No thyroid enlargement, no tenderness.  LUNGS: Normal breath sounds bilaterally, no wheezing, rales,rhonchi or crepitation. No use of accessory muscles of respiration.  CARDIOVASCULAR: S1, S2 normal. No murmurs, rubs, or gallops.  ABDOMEN: Soft, non-tender, non-distended. Bowel sounds present. No organomegaly or mass.  EXTREMITIES: No pedal edema, cyanosis, or clubbing.  NEUROLOGIC: Cranial nerves II through XII are intact. Muscle strength 5/5 in all extremities. Sensation intact. Gait not checked.  PSYCHIATRIC: The patient is alert and oriented x 3.  SKIN: No obvious rash,  lesion, or ulcer.  DATA REVIEW:   CBC Recent Labs  Lab 01/10/20 0600  WBC 5.4  HGB 12.5  HCT 36.9  PLT 186    Chemistries  Recent Labs  Lab 01/09/20 2055 01/10/20 0600  NA 141 135  K 4.9 3.6  CL 101 102  CO2 26 25  GLUCOSE 51* 273*  BUN 20 15  CREATININE 1.09* 0.94  CALCIUM 10.0 9.2  MG 2.1  --   AST 39  --   ALT 18  --   ALKPHOS 80  --   BILITOT 1.1  --      Outpatient follow-up  Follow-up Information    Lauro Regulus, MD. Schedule an appointment as soon as possible for a visit in 1 week(s).   Specialty: Internal Medicine Contact information: [address] [place] [address] [phone]                Management plans discussed with the patient, family and they are in agreement.  CODE STATUS: Prior   TOTAL TIME TAKING CARE OF THIS PATIENT: 45 minutes.    Delfino Lovett M.D on 01/11/2020 at 1:33 PM  [facility]   CC: Primary care physician; Lauro Regulus, MD   Note: This dictation was prepared with Dragon dictation along with smaller phrase technology. Any transcriptional errors that result from this process are unintentional.

## 2020-01-12 LAB — T3, FREE: T3, Free: 2.5 pg/mL (ref 2.0–4.4)

## 2020-02-10 ENCOUNTER — Ambulatory Visit (INDEPENDENT_AMBULATORY_CARE_PROVIDER_SITE_OTHER): Payer: Medicare Other | Admitting: Vascular Surgery

## 2020-02-10 ENCOUNTER — Ambulatory Visit (INDEPENDENT_AMBULATORY_CARE_PROVIDER_SITE_OTHER): Payer: Medicare Other

## 2020-02-10 ENCOUNTER — Other Ambulatory Visit: Payer: Self-pay

## 2020-02-10 ENCOUNTER — Encounter (INDEPENDENT_AMBULATORY_CARE_PROVIDER_SITE_OTHER): Payer: Self-pay | Admitting: Vascular Surgery

## 2020-02-10 VITALS — BP 152/75 | HR 102 | Resp 16 | Wt 123.6 lb

## 2020-02-10 DIAGNOSIS — I10 Essential (primary) hypertension: Secondary | ICD-10-CM

## 2020-02-10 DIAGNOSIS — I6523 Occlusion and stenosis of bilateral carotid arteries: Secondary | ICD-10-CM | POA: Diagnosis not present

## 2020-02-10 DIAGNOSIS — E1151 Type 2 diabetes mellitus with diabetic peripheral angiopathy without gangrene: Secondary | ICD-10-CM | POA: Diagnosis not present

## 2020-02-10 DIAGNOSIS — I739 Peripheral vascular disease, unspecified: Secondary | ICD-10-CM | POA: Diagnosis not present

## 2020-02-10 DIAGNOSIS — E782 Mixed hyperlipidemia: Secondary | ICD-10-CM

## 2020-02-10 NOTE — Progress Notes (Signed)
MRN : [id number]  Abigail Walker is a 75 y.o. (April 16, 1945) female who presents with chief complaint of  Chief Complaint  Patient presents with  . Carotid    6 month follow up  .  History of Present Illness:   The patient is seen for evaluation of carotid stenosis. The carotid stenosis was identified after duplex ultrasound was obtained for a bruit.  The patient denies amaurosis fugax. There is no recent history of TIA symptoms or focal motor deficits. There is no prior documented CVA.  There is no history of migraine headaches. There is no history of seizures.  The patient is taking enteric-coated aspirin 81 mg daily.   The patient is now also c/o painful lower extremities and diminished pulses. Patient notes the pain is always associated with activity and is very consistent day today. Typically, the pain occurs at less than one block, progress is as activity continues to the point that the patient must stop walking. Resting including standing still for several minutes allowed resumption of the activity and the ability to walk a similar distance before stopping again. Uneven terrain and inclined shorten the distance. The pain has been progressive over the past several years. The patient statesher inability to walk is now having a minor impact on quality of life and daily activities.  The patient denies rest pain or dangling of an extremity off the side of the bed during the night for relief. No open wounds or sores at this time. No prior interventions or surgeries.  No history of back problems or DJD of the lumbar sacral spine.   The patient has a history of coronary artery disease, no recent episodes of angina or shortness of breath. There is a history of hyperlipidemia which is being treated with a statin.   Duplex ultrasound 02/10/2019 is reviewed by me and shows RICA 0-27% and LICA 2-53%.  No significant change compared to last study.  Current Meds  Medication Sig  .  acetaminophen (TYLENOL) 500 MG tablet Take 500 mg by mouth every 6 (six) hours as needed for moderate pain.  Marland Kitchen buPROPion (WELLBUTRIN XL) 150 MG 24 hr tablet Take 450 mg by mouth at bedtime.  . Cholecalciferol (VITAMIN D3) 50 MCG (2000 UT) TABS Take 2,000 Units by mouth daily at 12 noon.  . clobetasol ointment (TEMOVATE) 0.05 % Apply as directed to affected areas groin twice a day.  . clopidogrel (PLAVIX) 75 MG tablet Take 75 mg by mouth daily.  . Cysteamine Bitartrate (PROCYSBI) 300 MG PACK Use 1 each 3 (three) times daily Use as instructed.  . docusate sodium (COLACE) 100 MG capsule Take 100 mg by mouth daily.  . ferrous sulfate 325 (65 FE) MG tablet Take 325 mg by mouth daily with breakfast.  . glucose blood ([employer] BLOOD GLUCOSE TEST) test strip 1 each (1 strip total) 3 (three) times daily for 30 days Use as instructed.  . hydrocortisone cream 1 % Apply 1 application topically as needed for itching.  . insulin glargine (LANTUS) 100 UNIT/ML injection Inject into the skin.  . Insulin Syringe-Needle U-100 (INSULIN SYRINGE 1CC/31GX5/16") 31G X 5/16" 1 ML MISC USE ONE SYRINGE TWICE DAILY AS DIRECTED WITH INSULIN VIALS  . levothyroxine (SYNTHROID, LEVOTHROID) 75 MCG tablet TAKE ONE TABLET BY MOUTH ONCE DAILY  . lisinopril (ZESTRIL) 5 MG tablet   . metFORMIN (GLUCOPHAGE) 500 MG tablet Take by mouth.  . neomycin-bacitracin-polymyxin (NEOSPORIN) ointment Apply 1 application topically as needed for wound care.  Marland Kitchen  nystatin cream (MYCOSTATIN) Apply 1 application topically as needed.  Marland Kitchen PARoxetine (PAXIL) 40 MG tablet Take 40 mg by mouth every morning.   . rosuvastatin (CRESTOR) 40 MG tablet Take 40 mg by mouth daily.  . TEA TREE OIL EX Apply topically as needed.  . torsemide (DEMADEX) 10 MG tablet Take 10 mg by mouth every other day.  Marland Kitchen UNABLE TO FIND as needed. [facility] antibiotic and pain relief cream    Past Medical History:  Diagnosis Date  . Acid reflux 06/16/2013  . Adult hypothyroidism 06/16/2013    Last Assessment & Plan:  Tsh and energy have been stable.    . Benign hypertension 06/16/2013   Last Assessment & Plan:  Patients blood pressure has seemingly been controlled without significant side effects such as dizziness or slow heart rate.     . Cancer ([place]) skin  . Sherran Needs syndrome   . Chronic kidney disease   . Depression   . Diabetes mellitus without complication ([place])   . GERD (gastroesophageal reflux disease)   . HBV (hepatitis B virus) infection   . High blood pressure   . Hives    food related  . Hypothyroidism   . Lichen planus   . Mixed incontinence   . Obstructive apnea 06/16/2013  . Sleep apnea   . Thyroid disease     Past Surgical History:  Procedure Laterality Date  . CATARACT EXTRACTION Bilateral   . ESOPHAGOGASTRODUODENOSCOPY (EGD) WITH PROPOFOL N/A 06/08/2015   Procedure: ESOPHAGOGASTRODUODENOSCOPY (EGD) WITH PROPOFOL;  Surgeon: Hulen Luster, MD;  Location: [facility] ENDOSCOPY;  Service: Gastroenterology;  Laterality: N/A;  . HEMORRHOID SURGERY    . IMPLANTABLE CONTACT LENS IMPLANTATION    . TONSILLECTOMY    . TUBAL LIGATION      Social History Social History   Tobacco Use  . Smoking status: Former Smoker    Quit date: 07/27/1993    Years since quitting: 26.5  . Smokeless tobacco: Never Used  . Tobacco comment: quit 20 years  Vaping Use  . Vaping Use: Never used  Substance Use Topics  . Alcohol use: No    Alcohol/week: 0.0 standard drinks  . Drug use: No    Family History Family History  Problem Relation Age of Onset  . Heart disease Father   . Cancer Mother   . Kidney failure Maternal Aunt     Allergies  Allergen Reactions  . Other     Other reaction(s): Other (See Comments), Other (See Comments) CLINDYAMYCIN = UNKNOWN CLINDYAMYCIN = UNKNOWN   . Sulfa Antibiotics Nausea And Vomiting  . Hydrocortisone-Iodoquinol  [Hydrocortisone-Iodoquinol] Rash     REVIEW OF SYSTEMS (Negative unless checked)  Constitutional: [] Weight loss   [] Fever  [] Chills Cardiac: [] Chest pain   [] Chest pressure   [] Palpitations   [] Shortness of breath when laying flat   [] Shortness of breath with exertion. Vascular:  [x] Pain in legs with walking   [] Pain in legs at rest  [] History of DVT   [] Phlebitis   [] Swelling in legs   [] Varicose veins   [] Non-healing ulcers Pulmonary:   [] Uses home oxygen   [] Productive cough   [] Hemoptysis   [] Wheeze  [] COPD   [] Asthma Neurologic:  [] Dizziness   [] Seizures   [] History of stroke   [] History of TIA  [] Aphasia   [] Vissual changes   [] Weakness or numbness in arm   [] Weakness or numbness in leg Musculoskeletal:   [] Joint swelling   [x] Joint pain   [] Low back pain Hematologic:  [] Easy  bruising  [] Easy bleeding   [] Hypercoagulable state   [] Anemic Gastrointestinal:  [] Diarrhea   [] Vomiting  [x] Gastroesophageal reflux/heartburn   [] Difficulty swallowing. Genitourinary:  [] Chronic kidney disease   [] Difficult urination  [] Frequent urination   [] Blood in urine Skin:  [] Rashes   [] Ulcers  Psychological:  [] History of anxiety   []  History of major depression.  Physical Examination  Vitals:   02/10/20 1354  BP: (!) 152/75  Pulse: (!) 102  Resp: 16  Weight: 123 lb 9.6 oz (56.1 kg)   Body mass index is 23.35 kg/m. Gen: WD/WN, NAD Head: [place]/AT, No temporalis wasting.  Ear/Nose/Throat: Hearing grossly intact, nares w/o erythema or drainage Eyes: PER, EOMI, sclera nonicteric.  Neck: Supple, no large masses.   Pulmonary:  Good air movement, no audible wheezing bilaterally, no use of accessory muscles.  Cardiac: RRR, no JVD Vascular: no carotid bruits auscultated  Vessel Right Left  Radial Palpable Palpable  Carotid Palpable Palpable  PT Not Palpable Not Palpable  DP Not Palpable Not Palpable  Gastrointestinal: Non-distended. No guarding/no peritoneal signs.  Musculoskeletal: M/S 5/5 throughout.  No deformity or atrophy.  Neurologic: CN 2-12 intact. Symmetrical.  Speech is fluent. Motor exam as listed  above. Psychiatric: Judgment intact, Mood & affect appropriate for pt's clinical situation. Dermatologic: No rashes or ulcers noted.  No changes consistent with cellulitis.   CBC Lab Results  Component Value Date   WBC 5.4 01/10/2020   HGB 12.5 01/10/2020   HCT 36.9 01/10/2020   MCV 88.7 01/10/2020   PLT 186 01/10/2020    BMET    Component Value Date/Time   NA 135 01/10/2020 0600   NA 140 05/19/2011 0409   K 3.6 01/10/2020 0600   K 4.2 05/19/2011 0409   CL 102 01/10/2020 0600   CL 105 05/19/2011 0409   CO2 25 01/10/2020 0600   CO2 28 05/19/2011 0409   GLUCOSE 273 (H) 01/10/2020 0600   GLUCOSE 158 (H) 05/19/2011 0409   BUN 15 01/10/2020 0600   BUN 15 05/19/2011 0409   CREATININE 0.94 01/10/2020 0600   CREATININE 1.18 05/19/2011 0409   CALCIUM 9.2 01/10/2020 0600   CALCIUM 9.2 05/19/2011 0409   GFRNONAA >60 01/10/2020 0600   GFRNONAA 48 (L) 05/19/2011 0409   GFRAA 37 (L) 01/09/2019 1432   GFRAA 56 (L) 05/19/2011 0409   CrCl cannot be calculated (Patient's most recent lab result is older than the maximum 21 days allowed.).  COAG Lab Results  Component Value Date   INR 0.98 08/14/2014    Radiology No results found.   Assessment/Plan 1. Bilateral carotid artery stenosis Recommend:  Given the patient's asymptomatic subcritical stenosis no further invasive testing or surgery at this time.  Duplex ultrasound obtained today is reviewed by me and shows RICA [id number] and LICA [id number]  Continue antiplatelet therapy as prescribed Continue management of CAD, HTN and Hyperlipidemia Healthy heart diet,  encouraged exercise at least 4 times per week.  Follow up in 6 months with duplex ultrasound and physical exam - VAS US CAROTID; Future  2. PAD (peripheral artery disease) (HCC)  Recommend:  As a new problem discussed today the patient has evidence of atherosclerosis of the lower extremities with claudication.  The patient does not voice lifestyle limiting changes  at this point in time.  Noninvasive studies do not suggest clinically significant change.  No invasive studies, angiography or surgery at this time The patient should continue walking and begin a more formal exercise program.  The patient should  continue antiplatelet therapy and aggressive treatment of the lipid abnormalities  No changes in the patient's medications at this time  - VAS [place] ABI WITH/WO TBI; Future  3. Benign hypertension Continue antihypertensive medications as already ordered, these medications have been reviewed and there are no changes at this time.   4. Type 2 diabetes mellitus with diabetic peripheral angiopathy without gangrene, unspecified whether long term insulin use ([place]) Continue hypoglycemic medications as already ordered, these medications have been reviewed and there are no changes at this time.  Hgb A1C to be monitored as already arranged by primary service   5. Mixed hyperlipidemia Continue statin as ordered and reviewed, no changes at this time    Hortencia Pilar, MD  02/10/2020 2:13 PM

## 2020-02-22 ENCOUNTER — Encounter (INDEPENDENT_AMBULATORY_CARE_PROVIDER_SITE_OTHER): Payer: Self-pay | Admitting: Vascular Surgery

## 2020-02-22 DIAGNOSIS — I739 Peripheral vascular disease, unspecified: Secondary | ICD-10-CM | POA: Insufficient documentation

## 2020-03-04 LAB — BLOOD GAS, VENOUS
Acid-Base Excess: 3 mmol/L — ABNORMAL HIGH (ref 0.0–2.0)
Bicarbonate: 31.2 mmol/L — ABNORMAL HIGH (ref 20.0–28.0)
O2 Saturation: 14 %
Patient temperature: 37
pCO2, Ven: 62 mmHg — ABNORMAL HIGH (ref 44.0–60.0)
pH, Ven: 7.31 (ref 7.250–7.430)

## 2020-06-29 NOTE — H&P (Signed)
Abigail Walker is a 75 y.o. female here for Pre Op Consulting .  History of Present Illness: Patient presents for a preoperative visit to schedule EUA to collect pap smear & EMX in the OR with possible D&C/hysteroscopy. At her last visit 6 weeks ago, blood was noted at the apex of the vagina, but her exam was unable to be completed due to limitations from lichen, loss of architecture, vaginal stenosis and pain.  She returns today for an ultrasound, preoperative exam and a repeat breast exam. However, our ultrasound availability precludes performing it today.  05/20/2020: +right breast with recent eccymosis and below just under the skin surface at 3 o'clock there is a mobile, smooth, 1cm x 1cm mass.   Today: Hx of excessive bleeding. Concerned about this complicating her surgery. Bled with / after having a BM.   Pertinent hx:   -On derm exam 11/2019: labia minora with severe scarring with loss of normal anatomy, right labia minora ulcerated, erythema improving w/ less ulceration of left labia minora -Dx oral & genital lichen planus; clobetasol & esterase for genitalia, Tacrolimus for mouth              -Derm in 09/2019 increased clobetasol ointment to vaginal opening & labia BID                   -"Possible lichen sclerosus" per derm, with "extensive scarring"    -MUI; s/p urology, s/p percutaneous tibial nerve stimulation tx in 2016-2017 -Vaginal irritation secondary to incontinence, atrophy too; vaginal estrogen helps with vulvar burning & irritation w/ urination - 4 weeks of "spotting" in 03/22 with small amount of bright red blood visible on pad   -HTN, on meds -CKD stage 3 -MGUS (monocloncal gammopathy of unknown significance); follows onc   -Occipital stroke in 12/2019; cognitive impairment, frequent falls; follows neuro -Hx of major depression, on meds -Charlie bonnet syndrome; 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 right ear, a lady is speaking on the phone professionally in her left ear              -Per  ear doctor, her hearing loss has triggered her "brain decides to want to hear something" -Weight loss of 175 lbs over the course of 15 years    Past Medical History:  has a past medical history of Depression, Diabetic neuropathy (CMS-HCC), Essential hypertension, benign, GERD (gastroesophageal reflux disease), Hepatitis B, Lichen planus, Microalbuminuria due to type 2 diabetes mellitus (CMS-HCC), Pure hypercholesterolemia, Restless leg syndrome, Skin cancer, basal cell (2011), Sleep apnea, Type 2 diabetes mellitus (CMS-HCC) (1992), Type II or unspecified type diabetes mellitus without mention of complication, uncontrolled, and Unspecified hypothyroidism.  Past Surgical History:  has a past surgical history that includes Tonsillectomy; Extraction Teeth; Tubal ligation (Bilateral); Hemorrhoid surgery; Basal cell skin cancer excision; Colonoscopy (02/22/2007); egd (02/22/2007, 10/22/2009); and egd (06/08/2015). Family History: family history includes Bipolar disorder in her daughter and mother; Coronary Artery Disease (Blocked arteries around heart) in her father; Diabetes in her paternal grandmother; Diverticulosis in her father; Other in her daughter and mother; Thrombosis in her father. Social History:  reports that she quit smoking about 25 years ago. Her smoking use included cigarettes. She has never used smokeless tobacco. She reports that she does not drink alcohol and does not use drugs. OB/GYN History:  OB History     Gravida  1   Para  1   Term  1   Preterm      AB  Living  1      SAB      IAB      Ectopic      Molar      Multiple      Live Births             Allergies: is allergic to metformin, other, sulfa (sulfonamide antibiotics), sulfasalazine, alcortin a [hydrocortisone-iodoquinl-aloe2], amoxicillin-pot clavulanate, and hydrocortisone-iodoquinol. Medications:  Current Outpatient Medications:    acetaminophen (TYLENOL) 500 MG tablet, Take 1,000 mg by mouth  as needed for Pain, Disp: , Rfl:    buPROPion (WELLBUTRIN XL) 150 MG XL tablet, Take 3 tablet po QD as directed, Disp: , Rfl:    clopidogreL (PLAVIX) 75 mg tablet, Take 1 tablet (75 mg total) by mouth once daily, Disp: 90 tablet, Rfl: 11   ferrous sulfate 325 (65 FE) MG tablet, Take 325 mg by mouth once daily   , Disp: , Rfl:    insulin GLARGINE (LANTUS) injection (concentration 100 units/mL), Inject 17 Units subcutaneously nightly, Disp: 15 mL, Rfl: 11   insulin syringe-needle U-100 1 mL 31 gauge x 5/16 syringe, 2 (two) times daily, Disp: 200 each, Rfl: 3   lancing device with lancets kit, Use 1 each 3 (three) times daily Use as instructed., Disp: 100 each, Rfl: 11   levothyroxine (SYNTHROID) 75 MCG tablet, Take 1 tablet (75 mcg total) by mouth once daily Take on an empty stomach with a glass of water at least 30-60 minutes before breakfast., Disp: 90 tablet, Rfl: 2   lidocaine-diphenhydramine-aluminum-magnesium-simethicone (FIRST MOUTHWASH BLM) oral suspension, Take 5 mLs by mouth every 6 (six) hours as needed, Disp: 300 mL, Rfl: 2   lisinopriL (ZESTRIL) 5 MG tablet, Take 5 mg by mouth once daily, Disp: , Rfl:    magnesium oxide 400 mg magnesium Tab, Take by mouth once daily, Disp: , Rfl:    ONETOUCH DELICA PLUS LANCET, USE 1 TO CHECK GLUCOSE THREE TIMES DAILY AS DIRECTED, Disp: , Rfl:    PARoxetine (PAXIL) 40 MG tablet, Take 40 mg by mouth once daily   , Disp: , Rfl:    pen needle, diabetic 31 gauge x 3/16" needle, Use., Disp: , Rfl:    rosuvastatin (CRESTOR) 40 MG tablet, Take 1 tablet (40 mg total) by mouth once daily, Disp: 30 tablet, Rfl: 11   TORsemide (DEMADEX) 10 MG tablet, Take 1 tablet by mouth Every other day, Disp: , Rfl:    blood glucose diagnostic (GLUCOSE BLOOD) test strip, 1 each (1 strip total) 3 (three) times daily for 30 days Use as instructed., Disp: 200 each, Rfl: 11   blood glucose meter kit, by XX route 2 (two) times daily for 30 days, Disp: 1 each, Rfl: 0  Review of  Systems: No SOB, no palpitations or chest pain, no new lower extremity edema, no nausea or vomiting or bowel or bladder complaints. See HPI for gyn specific ROS.   Exam:   BP 98/58   Pulse 87   Ht 154.9 cm (5' 1" )   Wt 55.8 kg (123 lb)   BMI 23.24 kg/m   General: Patient is well-groomed, well-nourished, appears stated age in no acute distress   HEENT: head is atraumatic and normocephalic, trachea is midline, neck is supple with no palpable nodules   CV: Regular rhythm and normal heart rate, no murmur   Pulm: Clear to auscultation throughout lung fields with no wheezing, crackles, or rhonchi. No increased work of breathing  Abdomen: soft , no mass, non-tender, no  rebound tenderness, no hepatomegaly  Pelvic: deferred due to pain   Impression:   The primary encounter diagnosis was PMB (postmenopausal bleeding). A diagnosis of Mass of right breast, unspecified quadrant was also pertinent to this visit.  Plan:   1.  Postmenopausal Bleeding  -Questions and concerns answered today -We discussed the possibilities of atrophy, benign endometrial polyp, hyperplasia and cancer. I recommend an exam under anesthesia due to patient's level of discomfort and extent of limited pelvic exam. Patient is amendable to an exam under anesthesia.  She should stop her blood thinner the week before.  -Risks of surgery were discussed with the patient including but not limited to: bleeding which may require transfusion; infection which may require antibiotics; injury to uterus or surrounding organs; intrauterine scarring which may impair future fertility; need for additional procedures including laparotomy or laparoscopy; and other postoperative/anesthesia complications. Written informed consent was obtained.  This is a scheduled same-day surgery. She will have a postop visit in 2 weeks to review operative findings and pathology.   Return for Postop check.

## 2020-07-07 ENCOUNTER — Other Ambulatory Visit
Admission: RE | Admit: 2020-07-07 | Discharge: 2020-07-07 | Disposition: A | Discharge status: Home / Self Care | Payer: Medicare Other | Source: Ambulatory Visit | Attending: Obstetrics and Gynecology | Admitting: Obstetrics and Gynecology

## 2020-07-07 ENCOUNTER — Other Ambulatory Visit: Payer: Self-pay

## 2020-07-07 NOTE — Pre-Procedure Instructions (Signed)
Patient reports that she has no one to be with her at her home after her procedure, she does report that she has neighbors on both sides of her that will check on her. Dr. Leafy Ro is aware.

## 2020-07-07 NOTE — Patient Instructions (Addendum)
Your procedure is scheduled on: 07/13/20 - Monday Report to the Registration Desk on the 1st floor of the [facility]. To find out your arrival time, please call [phone] between 1PM - 3PM on: 07/10/20 - Friday  Report to [facility] for Labs and Bag on 07/09/20 at 2:00 pm.  REMEMBER: Instructions that are not followed completely may result in serious medical risk, up to and including death; or upon the discretion of your surgeon and anesthesiologist your surgery may need to be rescheduled.  Do not eat food after midnight the night before surgery.  No gum chewing, lozengers or hard candies.  You may however, drink CLEAR liquids up to 2 hours before you are scheduled to arrive for your surgery. Do not drink anything within 2 hours of your scheduled arrival time, Type 1 and Type 2 diabetics should only drink water.  TAKE THESE MEDICATIONS THE MORNING OF SURGERY WITH A SIP OF WATER:  - levothyroxine (SYNTHROID, LEVOTHROID) 75 MCG tab  Take 1/2 of insulin glargine (LANTUS) 100 UNIT/ML injection dose the night before surgery.  Follow recommendations from Cardiologist, Pulmonologist or PCP regarding stopping Aspirin, Coumadin, Plavix, Eliquis, Pradaxa, or Pletal. Stop taking Plavix beginning 07/07/20, resume taking per Doctor orders.  One week prior to surgery: Stop Anti-inflammatories (NSAIDS) such as Advil, Aleve, Ibuprofen, Motrin, Naproxen, Naprosyn and Aspirin based products such as Excedrin, Goodys Powder, BC Powder.  Stop ANY OVER THE COUNTER supplements until after surgery :   You may take Tylenol if needed for pain up until the day of surgery.  No Alcohol for 24 hours before or after surgery.  No Smoking including e-cigarettes for 24 hours prior to surgery.  No chewable tobacco products for at least 6 hours prior to surgery.  No nicotine patches on the day of surgery.  Do not use any "recreational" drugs for at least a week prior to your surgery.  Please be advised that  the combination of cocaine and anesthesia may have negative outcomes, up to and including death. If you test positive for cocaine, your surgery will be cancelled.  On the morning of surgery brush your teeth with toothpaste and water, you may rinse your mouth with mouthwash if you wish. Do not swallow any toothpaste or mouthwash.  Bath/Shower using your regular soap the morning of surgery/procedure.  Do not wear jewelry, make-up, hairpins, clips or nail polish.  Do not wear lotions, powders, or perfumes.   Do not shave body from the neck down 48 hours prior to surgery just in case you cut yourself which could leave a site for infection.  Also, freshly shaved skin may become irritated if using the CHG soap.  Contact lenses, hearing aids and dentures may not be worn into surgery.  Do not bring valuables to the hospital. [facility] is not responsible for any missing/lost belongings or valuables.   Notify your doctor if there is any change in your medical condition (cold, fever, infection).  Wear comfortable clothing (specific to your surgery type) to the hospital.  After surgery, you can help prevent lung complications by doing breathing exercises.  Take deep breaths and cough every 1-2 hours. Your doctor may order a device called an Incentive Spirometer to help you take deep breaths. When coughing or sneezing, hold a pillow firmly against your incision with both hands. This is called "splinting." Doing this helps protect your incision. It also decreases belly discomfort.  If you are being admitted to the hospital overnight, leave your suitcase in  the car. After surgery it may be brought to your room.  If you are being discharged the day of surgery, you will not be allowed to drive home. You will need a responsible adult (18 years or older) to drive you home and stay with you that night.   If you are taking public transportation, you will need to have a responsible adult (18 years or  older) with you. Please confirm with your physician that it is acceptable to use public transportation.   Please call the [facility] at [phone] if you have any questions about these instructions.  Surgery Visitation Policy:  Patients undergoing a surgery or procedure may have one family member or support person with them as long as that person is not COVID-19 positive or experiencing its symptoms.  That person may remain in the waiting area during the procedure.  Inpatient Visitation:    Visiting hours are 7 a.m. to 8 p.m. Inpatients will be allowed two visitors daily. The visitors may change each day during the patient's stay. No visitors under the age of 66. Any visitor under the age of 67 must be accompanied by an adult. The visitor must pass COVID-19 screenings, use hand sanitizer when entering and exiting the patient's room and wear a mask at all times, including in the patient's room. Patients must also wear a mask when staff or their visitor are in the room. Masking is required regardless of vaccination status.

## 2020-07-09 ENCOUNTER — Other Ambulatory Visit
Admission: RE | Admit: 2020-07-09 | Discharge: 2020-07-09 | Disposition: A | Discharge status: Home / Self Care | Payer: Medicare Other | Source: Ambulatory Visit | Attending: Obstetrics and Gynecology | Admitting: Obstetrics and Gynecology

## 2020-07-09 ENCOUNTER — Other Ambulatory Visit: Payer: Self-pay

## 2020-07-09 ENCOUNTER — Encounter: Payer: Self-pay | Admitting: Urgent Care

## 2020-07-09 ENCOUNTER — Encounter: Payer: Self-pay | Admitting: Obstetrics and Gynecology

## 2020-07-09 DIAGNOSIS — Z01818 Encounter for other preprocedural examination: Secondary | ICD-10-CM | POA: Insufficient documentation

## 2020-07-09 DIAGNOSIS — Z01810 Encounter for preprocedural cardiovascular examination: Secondary | ICD-10-CM | POA: Diagnosis not present

## 2020-07-09 LAB — BASIC METABOLIC PANEL
Anion gap: 3 — ABNORMAL LOW (ref 5–15)
BUN: 27 mg/dL — ABNORMAL HIGH (ref 8–23)
CO2: 31 mmol/L (ref 22–32)
Calcium: 9.4 mg/dL (ref 8.9–10.3)
Chloride: 101 mmol/L (ref 98–111)
Creatinine, Ser: 1.27 mg/dL — ABNORMAL HIGH (ref 0.44–1.00)
GFR, Estimated: 44 mL/min — ABNORMAL LOW (ref >60)
Glucose, Bld: 337 mg/dL — ABNORMAL HIGH (ref 70–99)
Potassium: 5 mmol/L (ref 3.5–5.1)
Sodium: 135 mmol/L (ref 135–145)

## 2020-07-09 LAB — CBC
HCT: 37.8 % (ref 36.0–46.0)
Hemoglobin: 12.5 g/dL (ref 12.0–15.0)
MCH: 29.7 pg (ref 26.0–34.0)
MCHC: 33.1 g/dL (ref 30.0–36.0)
MCV: 89.8 fL (ref 80.0–100.0)
Platelets: 116 10*3/uL — ABNORMAL LOW (ref 150–400)
RBC: 4.21 MIL/uL (ref 3.87–5.11)
RDW: 12.5 % (ref 11.5–15.5)
WBC: 2.4 10*3/uL — ABNORMAL LOW (ref 4.0–10.5)
nRBC: 0 % (ref 0.0–0.2)

## 2020-07-09 LAB — TYPE AND SCREEN
ABO/RH(D): O POS
Antibody Screen: NEGATIVE

## 2020-07-13 ENCOUNTER — Encounter
Admission: RE | Disposition: A | Discharge status: Home / Self Care | Payer: Self-pay | Source: Home / Self Care | Attending: Obstetrics and Gynecology

## 2020-07-13 ENCOUNTER — Ambulatory Visit
Admission: RE | Admit: 2020-07-13 | Discharge: 2020-07-13 | Disposition: A | Discharge status: Home / Self Care | Payer: Medicare Other | Attending: Obstetrics and Gynecology | Admitting: Obstetrics and Gynecology

## 2020-07-13 DIAGNOSIS — Z8249 Family history of ischemic heart disease and other diseases of the circulatory system: Secondary | ICD-10-CM | POA: Diagnosis not present

## 2020-07-13 DIAGNOSIS — Z888 Allergy status to other drugs, medicaments and biological substances status: Secondary | ICD-10-CM | POA: Insufficient documentation

## 2020-07-13 DIAGNOSIS — N950 Postmenopausal bleeding: Secondary | ICD-10-CM | POA: Insufficient documentation

## 2020-07-13 DIAGNOSIS — Z87891 Personal history of nicotine dependence: Secondary | ICD-10-CM | POA: Diagnosis not present

## 2020-07-13 DIAGNOSIS — Z539 Procedure and treatment not carried out, unspecified reason: Secondary | ICD-10-CM | POA: Insufficient documentation

## 2020-07-13 DIAGNOSIS — E1140 Type 2 diabetes mellitus with diabetic neuropathy, unspecified: Secondary | ICD-10-CM | POA: Diagnosis not present

## 2020-07-13 DIAGNOSIS — E1122 Type 2 diabetes mellitus with diabetic chronic kidney disease: Secondary | ICD-10-CM | POA: Diagnosis not present

## 2020-07-13 DIAGNOSIS — Z79899 Other long term (current) drug therapy: Secondary | ICD-10-CM | POA: Diagnosis not present

## 2020-07-13 DIAGNOSIS — Z882 Allergy status to sulfonamides status: Secondary | ICD-10-CM | POA: Diagnosis not present

## 2020-07-13 DIAGNOSIS — Z794 Long term (current) use of insulin: Secondary | ICD-10-CM | POA: Insufficient documentation

## 2020-07-13 DIAGNOSIS — Z85828 Personal history of other malignant neoplasm of skin: Secondary | ICD-10-CM | POA: Diagnosis not present

## 2020-07-13 DIAGNOSIS — N183 Chronic kidney disease, stage 3 unspecified: Secondary | ICD-10-CM | POA: Diagnosis not present

## 2020-07-13 DIAGNOSIS — Z880 Allergy status to penicillin: Secondary | ICD-10-CM | POA: Diagnosis not present

## 2020-07-13 DIAGNOSIS — Z8673 Personal history of transient ischemic attack (TIA), and cerebral infarction without residual deficits: Secondary | ICD-10-CM | POA: Diagnosis not present

## 2020-07-13 DIAGNOSIS — Z833 Family history of diabetes mellitus: Secondary | ICD-10-CM | POA: Diagnosis not present

## 2020-07-13 DIAGNOSIS — I129 Hypertensive chronic kidney disease with stage 1 through stage 4 chronic kidney disease, or unspecified chronic kidney disease: Secondary | ICD-10-CM | POA: Diagnosis not present

## 2020-07-13 DIAGNOSIS — N895 Stricture and atresia of vagina: Secondary | ICD-10-CM | POA: Insufficient documentation

## 2020-07-13 HISTORY — DX: Left bundle-branch block, unspecified: I44.7

## 2020-07-13 SURGERY — DILATATION AND CURETTAGE /HYSTEROSCOPY
Anesthesia: Choice

## 2020-07-13 MED ORDER — POVIDONE-IODINE 10 % EX SWAB: 2.0000 "application " | Freq: Once | CUTANEOUS | Status: DC

## 2020-07-13 MED ORDER — CHLORHEXIDINE GLUCONATE 0.12 % MT SOLN: 15.0000 mL | Freq: Once | OROMUCOSAL | Status: DC

## 2020-07-13 MED ORDER — SODIUM CHLORIDE 0.9 % IV SOLN: INTRAVENOUS | Status: DC

## 2020-07-13 MED ORDER — FAMOTIDINE 20 MG PO TABS: 20.0000 mg | ORAL_TABLET | Freq: Once | ORAL | Status: DC

## 2020-07-13 MED ORDER — APREPITANT 40 MG PO CAPS: 40.0000 mg | ORAL_CAPSULE | Freq: Once | ORAL | Status: DC

## 2020-07-13 MED ORDER — ORAL CARE MOUTH RINSE: 15.0000 mL | Freq: Once | OROMUCOSAL | Status: DC

## 2020-07-13 MED ORDER — LACTATED RINGERS IV SOLN: INTRAVENOUS | Status: DC

## 2020-07-13 SURGICAL SUPPLY — 29 items
BACTOSHIELD CHG 4% 4OZ (MISCELLANEOUS) ×2
BAG INFUSER PRESSURE 100CC (MISCELLANEOUS) ×3 IMPLANT
CATH ROBINSON RED A/P 16FR (CATHETERS) ×3 IMPLANT
COVER WAND RF STERILE (DRAPES) ×3 IMPLANT
CUP MEDICINE 2OZ PLAST GRAD ST (MISCELLANEOUS) ×3 IMPLANT
ELECT REM PT RETURN 9FT ADLT (ELECTROSURGICAL) ×3
ELECTRODE REM PT RTRN 9FT ADLT (ELECTROSURGICAL) ×1 IMPLANT
GAUZE 4X4 16PLY ~~LOC~~+RFID DBL (SPONGE) ×6 IMPLANT
GLOVE SURG ENC MOIS LTX SZ7 (GLOVE) ×3 IMPLANT
GLOVE SURG UNDER LTX SZ7.5 (GLOVE) ×3 IMPLANT
GOWN STRL REUS W/ TWL LRG LVL3 (GOWN DISPOSABLE) ×2 IMPLANT
GOWN STRL REUS W/TWL LRG LVL3 (GOWN DISPOSABLE) ×4
IV NS IRRIG 3000ML ARTHROMATIC (IV SOLUTION) ×3 IMPLANT
KIT PROCEDURE FLUENT (KITS) ×3 IMPLANT
KIT TURNOVER CYSTO (KITS) ×3 IMPLANT
LABEL OR SOLS (LABEL) ×3 IMPLANT
MANIFOLD NEPTUNE II (INSTRUMENTS) ×3 IMPLANT
NS IRRIG 500ML POUR BTL (IV SOLUTION) ×3 IMPLANT
PACK DNC HYST (MISCELLANEOUS) ×3 IMPLANT
PAD OB MATERNITY 4.3X12.25 (PERSONAL CARE ITEMS) ×3 IMPLANT
PAD PREP 24X41 OB/GYN DISP (PERSONAL CARE ITEMS) ×3 IMPLANT
SCRUB CHG 4% DYNA-HEX 4OZ (MISCELLANEOUS) ×1 IMPLANT
SEAL ROD LENS SCOPE MYOSURE (ABLATOR) ×3 IMPLANT
SOL PREP PVP 2OZ (MISCELLANEOUS) ×3
SOLUTION PREP PVP 2OZ (MISCELLANEOUS) ×1 IMPLANT
SURGILUBE 2OZ TUBE FLIPTOP (MISCELLANEOUS) ×3 IMPLANT
TOWEL OR 17X26 4PK STRL BLUE (TOWEL DISPOSABLE) ×3 IMPLANT
TUBING CONNECTING 10 (TUBING) ×2 IMPLANT
TUBING CONNECTING 10' (TUBING) ×1

## 2020-08-10 ENCOUNTER — Ambulatory Visit (INDEPENDENT_AMBULATORY_CARE_PROVIDER_SITE_OTHER): Payer: Medicare Other

## 2020-08-10 ENCOUNTER — Encounter (INDEPENDENT_AMBULATORY_CARE_PROVIDER_SITE_OTHER): Payer: Self-pay | Admitting: Vascular Surgery

## 2020-08-10 ENCOUNTER — Ambulatory Visit (INDEPENDENT_AMBULATORY_CARE_PROVIDER_SITE_OTHER): Payer: Medicare Other | Admitting: Vascular Surgery

## 2020-08-10 ENCOUNTER — Other Ambulatory Visit: Payer: Self-pay

## 2020-08-10 VITALS — BP 161/68 | HR 87 | Ht 61.0 in | Wt 128.0 lb

## 2020-08-10 DIAGNOSIS — I10 Essential (primary) hypertension: Secondary | ICD-10-CM | POA: Diagnosis not present

## 2020-08-10 DIAGNOSIS — I739 Peripheral vascular disease, unspecified: Secondary | ICD-10-CM | POA: Diagnosis not present

## 2020-08-10 DIAGNOSIS — E782 Mixed hyperlipidemia: Secondary | ICD-10-CM

## 2020-08-10 DIAGNOSIS — E1151 Type 2 diabetes mellitus with diabetic peripheral angiopathy without gangrene: Secondary | ICD-10-CM

## 2020-08-10 DIAGNOSIS — I6523 Occlusion and stenosis of bilateral carotid arteries: Secondary | ICD-10-CM

## 2020-08-10 NOTE — Progress Notes (Signed)
MRN : [id number]  Abigail Walker is a 75 y.o. (July 23, 1945) female who presents with chief complaint of  Chief Complaint  Patient presents with   Follow-up    6 Mo carotid US  .  History of Present Illness:   The patient is seen for evaluation of carotid stenosis. The carotid stenosis was identified after duplex ultrasound was obtained for a bruit.   The patient denies amaurosis fugax. There is no recent history of TIA symptoms or focal motor deficits. There is no prior documented CVA.   There is no history of migraine headaches. There is no history of seizures.   The patient is taking enteric-coated aspirin 81 mg daily.    The patient is now also c/o painful lower extremities and diminished pulses. Patient notes the pain is always associated with activity and is very consistent day today. Typically, the pain occurs at less than one block, progress is as activity continues to the point that the patient must stop walking. Resting including standing still for several minutes allowed resumption of the activity and the ability to walk a similar distance before stopping again. Uneven terrain and inclined shorten the distance. The pain has been progressive over the past several years. The patient statesher inability to walk is now having a minor impact on quality of life and daily activities.   The patient denies rest pain or dangling of an extremity off the side of the bed during the night for relief. No open wounds or sores at this time. No prior interventions or surgeries.   No history of back problems or DJD of the lumbar sacral spine.    The patient has a history of coronary artery disease, no recent episodes of angina or shortness of breath. There is a history of hyperlipidemia which is being treated with a statin.    Duplex ultrasound 08/10/2020 is reviewed by me and shows RICA 123456 and LICA 123456.  No significant change compared to last study.  ABI's Rt=[place] (TBI=0.79) and Lt=[place]  (TBI=0.73) triphasic signals bilaterally   Current Meds  Medication Sig   acetaminophen (TYLENOL) 500 MG tablet Take 500 mg by mouth every 6 (six) hours as needed for moderate pain.   buPROPion (WELLBUTRIN XL) 150 MG 24 hr tablet Take 450 mg by mouth at bedtime.   Cholecalciferol (VITAMIN D3) 50 MCG (2000 UT) TABS Take 2,000 Units by mouth daily.   clobetasol ointment (TEMOVATE) 0.05 % Apply as directed to affected areas groin twice a day. (Patient taking differently: Apply 1 application topically daily as needed (lichen planus).)   clopidogrel (PLAVIX) 75 MG tablet Take 75 mg by mouth daily.   docusate sodium (COLACE) 100 MG capsule Take 100 mg by mouth daily.   ferrous sulfate 325 (65 FE) MG tablet Take 325 mg by mouth daily with breakfast.   hydrocortisone cream 1 % Apply 1 application topically as needed for itching.   insulin glargine (LANTUS) 100 UNIT/ML injection Inject 17 Units into the skin at bedtime.   insulin NPH-regular Human (NOVOLIN 70/30) (70-30) 100 UNIT/ML injection Inject 20 Units into the skin 2 (two) times daily with a meal.   Insulin Syringe-Needle U-100 (INSULIN SYRINGE 1CC/31GX5/16") 31G X 5/16" 1 ML MISC USE ONE SYRINGE TWICE DAILY AS DIRECTED WITH INSULIN VIALS   levothyroxine (SYNTHROID, LEVOTHROID) 75 MCG tablet Take 75 mcg by mouth daily before breakfast.   neomycin-bacitracin-polymyxin (NEOSPORIN) ointment Apply 1 application topically as needed for wound care.   nystatin cream (MYCOSTATIN) Apply  1 application topically as needed (yeast).   PARoxetine (PAXIL) 40 MG tablet Take 40 mg by mouth every morning.    rosuvastatin (CRESTOR) 40 MG tablet Take 40 mg by mouth daily.   TEA TREE OIL EX Apply 1 application topically as needed (bruising/pain/irritation).   torsemide (DEMADEX) 10 MG tablet Take 10 mg by mouth every other day.    Past Medical History:  Diagnosis Date   Acid reflux 06/16/2013   Adult hypothyroidism 06/16/2013   Last Assessment & Plan:  Tsh and  energy have been stable.     Benign hypertension 06/16/2013   Last Assessment & Plan:  Patients blood pressure has seemingly been controlled without significant side effects such as dizziness or slow heart rate.      Cancer ([place]) skin   Sherran Needs syndrome    Chronic kidney disease    Depression    Diabetes mellitus without complication (HCC)    GERD (gastroesophageal reflux disease)    HBV (hepatitis B virus) infection    High blood pressure    Hives    food related   Hypothyroidism    LBBB (left bundle branch block)    Lichen planus    Mixed incontinence    Obstructive apnea 06/16/2013   Sleep apnea    Thyroid disease     Past Surgical History:  Procedure Laterality Date   CATARACT EXTRACTION Bilateral    ESOPHAGOGASTRODUODENOSCOPY (EGD) WITH PROPOFOL N/A 06/08/2015   Procedure: ESOPHAGOGASTRODUODENOSCOPY (EGD) WITH PROPOFOL;  Surgeon: Hulen Luster, MD;  Location: [facility] ENDOSCOPY;  Service: Gastroenterology;  Laterality: N/A;   HEMORRHOID SURGERY     IMPLANTABLE CONTACT LENS IMPLANTATION     TONSILLECTOMY     TUBAL LIGATION      Social History Social History   Tobacco Use   Smoking status: Former    Types: Cigarettes    Quit date: 07/27/1993    Years since quitting: 27.0   Smokeless tobacco: Never   Tobacco comments:    quit 20 years  Vaping Use   Vaping Use: Never used  Substance Use Topics   Alcohol use: No    Alcohol/week: 0.0 standard drinks   Drug use: No    Family History Family History  Problem Relation Age of Onset   Heart disease Father    Cancer Mother    Kidney failure Maternal Aunt     Allergies  Allergen Reactions   Clindamycin Hcl     Pt unsure of this allergy, it was taken at the same time as the sulfa drug   Metformin And Related Nausea And Vomiting   Sulfa Antibiotics Nausea And Vomiting   Hydrocortisone-Iodoquinol  [Hydrocortisone-Iodoquinol] Rash    Patient reports that she does not believe that she has allergy to this, that she  was receiving many medications at once.     REVIEW OF SYSTEMS (Negative unless checked)  Constitutional: '[]'$ Weight loss  '[]'$ Fever  '[]'$ Chills Cardiac: '[]'$ Chest pain   '[]'$ Chest pressure   '[]'$ Palpitations   '[]'$ Shortness of breath when laying flat   '[]'$ Shortness of breath with exertion. Vascular:  '[]'$ Pain in legs with walking   '[]'$ Pain in legs at rest  '[]'$ History of DVT   '[]'$ Phlebitis   '[]'$ Swelling in legs   '[]'$ Varicose veins   '[]'$ Non-healing ulcers Pulmonary:   '[]'$ Uses home oxygen   '[]'$ Productive cough   '[]'$ Hemoptysis   '[]'$ Wheeze  '[]'$ COPD   '[]'$ Asthma Neurologic:  '[]'$ Dizziness   '[]'$ Seizures   '[]'$ History of stroke   '[]'$ History of TIA  '[]'$ Aphasia   '[]'$   Vissual changes   '[]'$ Weakness or numbness in arm   '[]'$ Weakness or numbness in leg Musculoskeletal:   '[]'$ Joint swelling   '[]'$ Joint pain   '[]'$ Low back pain Hematologic:  '[]'$ Easy bruising  '[]'$ Easy bleeding   '[]'$ Hypercoagulable state   '[]'$ Anemic Gastrointestinal:  '[]'$ Diarrhea   '[]'$ Vomiting  '[]'$ Gastroesophageal reflux/heartburn   '[]'$ Difficulty swallowing. Genitourinary:  '[]'$ Chronic kidney disease   '[]'$ Difficult urination  '[]'$ Frequent urination   '[]'$ Blood in urine Skin:  '[]'$ Rashes   '[]'$ Ulcers  Psychological:  '[]'$ History of anxiety   '[]'$  History of major depression.  Physical Examination  Vitals:   08/10/20 1405  BP: (!) 161/68  Pulse: 87  Weight: 128 lb (58.1 kg)  Height: '5\' 1"'$  (1.549 m)   Body mass index is 24.19 kg/m. Gen: WD/WN, NAD Head: [place]/AT, No temporalis wasting.  Ear/Nose/Throat: Hearing grossly intact, nares w/o erythema or drainage Eyes: PER, EOMI, sclera nonicteric.  Neck: Supple, no large masses.   Pulmonary:  Good air movement, no audible wheezing bilaterally, no use of accessory muscles.  Cardiac: RRR, no JVD Vascular:  Vessel Right Left  Radial Palpable Palpable  PT Not Palpable Not Palpable  DP Not Palpable Not Palpable  Gastrointestinal: Non-distended. No guarding/no peritoneal signs.  Musculoskeletal: M/S 5/5 throughout.  No deformity or atrophy.  Neurologic: CN 2-12  intact. Symmetrical.  Speech is fluent. Motor exam as listed above. Psychiatric: Judgment intact, Mood & affect appropriate for pt's clinical situation. Dermatologic: No rashes or ulcers noted.  No changes consistent with cellulitis. Lymph : No lichenification or skin changes of chronic lymphedema.  CBC Lab Results  Component Value Date   WBC 2.4 (L) 07/09/2020   HGB 12.5 07/09/2020   HCT 37.8 07/09/2020   MCV 89.8 07/09/2020   PLT 116 (L) 07/09/2020    BMET    Component Value Date/Time   NA 135 07/09/2020 1404   NA 140 05/19/2011 0409   K 5.0 07/09/2020 1404   K 4.2 05/19/2011 0409   CL 101 07/09/2020 1404   CL 105 05/19/2011 0409   CO2 31 07/09/2020 1404   CO2 28 05/19/2011 0409   GLUCOSE 337 (H) 07/09/2020 1404   GLUCOSE 158 (H) 05/19/2011 0409   BUN 27 (H) 07/09/2020 1404   BUN 15 05/19/2011 0409   CREATININE 1.27 (H) 07/09/2020 1404   CREATININE 1.18 05/19/2011 0409   CALCIUM 9.4 07/09/2020 1404   CALCIUM 9.2 05/19/2011 0409   GFRNONAA 44 (L) 07/09/2020 1404   GFRNONAA 48 (L) 05/19/2011 0409   GFRAA 37 (L) 01/09/2019 1432   GFRAA 56 (L) 05/19/2011 0409   CrCl cannot be calculated (Patient's most recent lab result is older than the maximum 21 days allowed.).  COAG Lab Results  Component Value Date   INR 0.98 08/14/2014    Radiology No results found.    Assessment/Plan 1. Bilateral carotid artery stenosis Recommend:  Given the patient's asymptomatic subcritical stenosis no further invasive testing or surgery at this time.  Duplex ultrasound shows 1-39% stenosis bilaterally.  Continue antiplatelet therapy as prescribed Continue management of CAD, HTN and Hyperlipidemia Healthy heart diet,  encouraged exercise at least 4 times per week Follow up in 12 months with duplex ultrasound and physical exam.    - VAS US CAROTID; Future  2. PAD (peripheral artery disease) (HCC)  Recommend:  The patient has evidence of atherosclerosis of the lower  extremities with claudication.  The patient does not voice lifestyle limiting changes at this point in time.  Noninvasive studies do not suggest clinically significant  change.  No invasive studies, angiography or surgery at this time The patient should continue walking and begin a more formal exercise program.  The patient should continue antiplatelet therapy and aggressive treatment of the lipid abnormalities  No changes in the patient's medications at this time  The patient should continue wearing graduated compression socks 10-15 mmHg strength to control the mild edema.    3. Benign hypertension Continue antihypertensive medications as already ordered, these medications have been reviewed and there are no changes at this time.   4. Type 2 diabetes mellitus with diabetic peripheral angiopathy without gangrene, unspecified whether long term insulin use ([place]) Continue hypoglycemic medications as already ordered, these medications have been reviewed and there are no changes at this time.  Hgb A1C to be monitored as already arranged by primary service   5. Mixed hyperlipidemia Continue statin as ordered and reviewed, no changes at this time     Hortencia Pilar, MD  08/10/2020 2:07 PM

## 2020-08-11 ENCOUNTER — Encounter (INDEPENDENT_AMBULATORY_CARE_PROVIDER_SITE_OTHER): Payer: Self-pay | Admitting: Vascular Surgery

## 2020-09-10 IMAGING — CT CT HEAD W/O CM
3 series · 14 of 44 positions shown, 16 images · non-contrast
Comparison: MR brain dated March 21, 2018. MRI cervical spine dated
April 12, 2006.

CLINICAL DATA: Fall.

EXAM:
CT HEAD WITHOUT CONTRAST
CT MAXILLOFACIAL WITHOUT CONTRAST
CT CERVICAL SPINE WITHOUT CONTRAST
TECHNIQUE: Multidetector CT imaging of the head, cervical spine, and
maxillofacial structures were performed using the standard protocol
without intravenous contrast. Multiplanar CT image reconstructions
of the cervical spine and maxillofacial structures were also
generated.

[Series 3: head wo · axial · 0.47mm/px · z∈[-132,-22]mm · 8 of 27 slices shown, 10 images]
[im 3/27  brain]
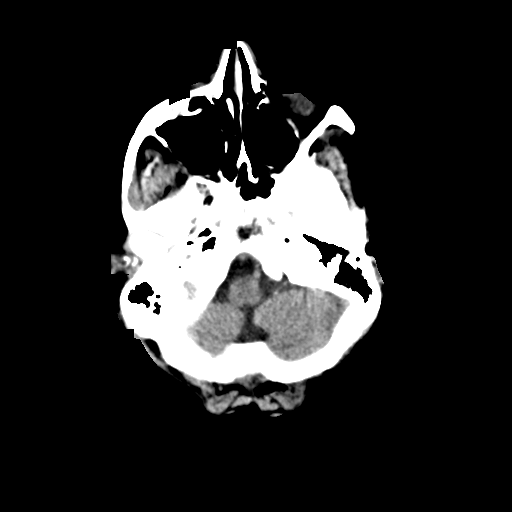
[im 3/27  bone]
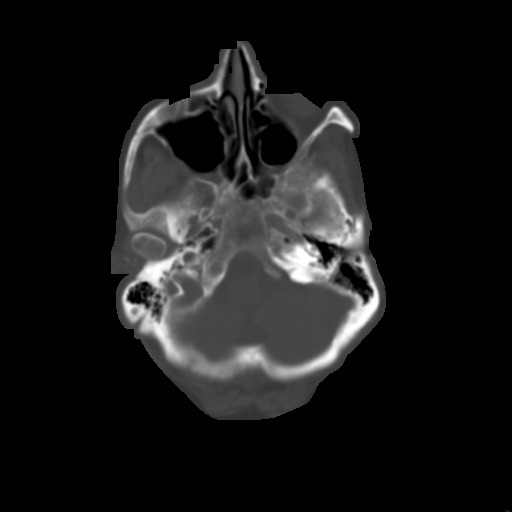
[im 6/27  brain]
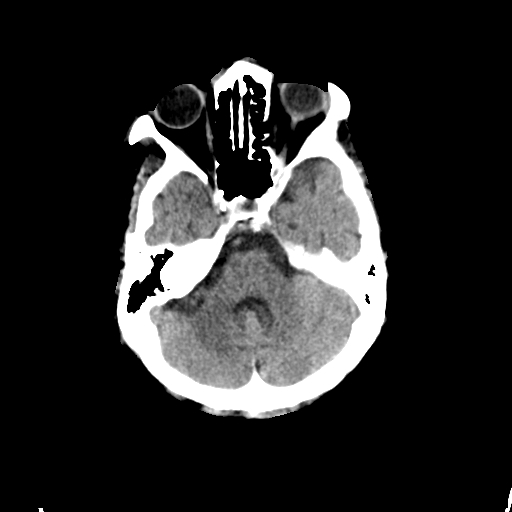
[im 9/27  brain]
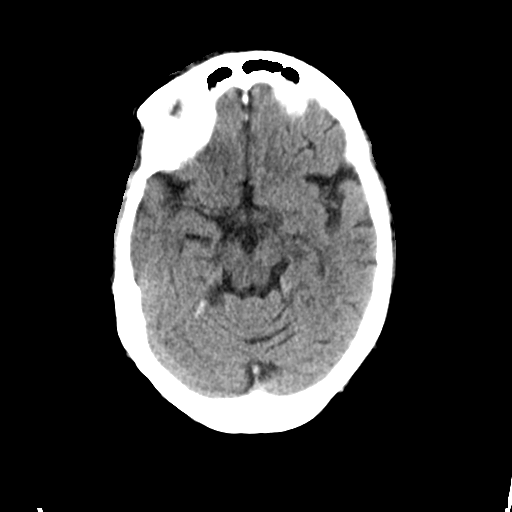
[im 12/27  brain]
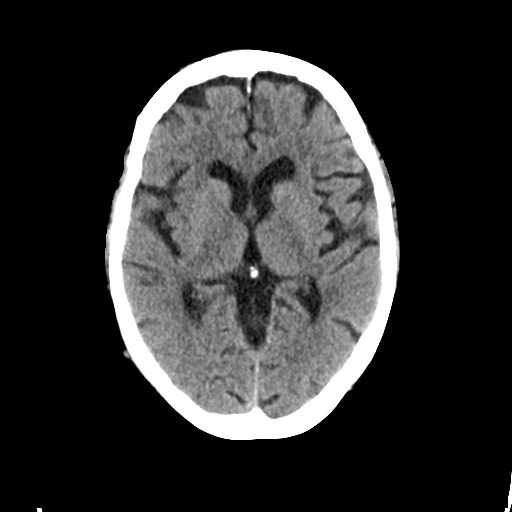
[im 16/27  brain]
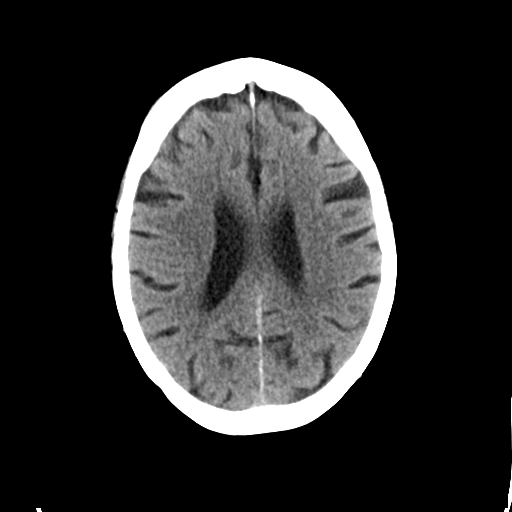
[im 16/27  bone]
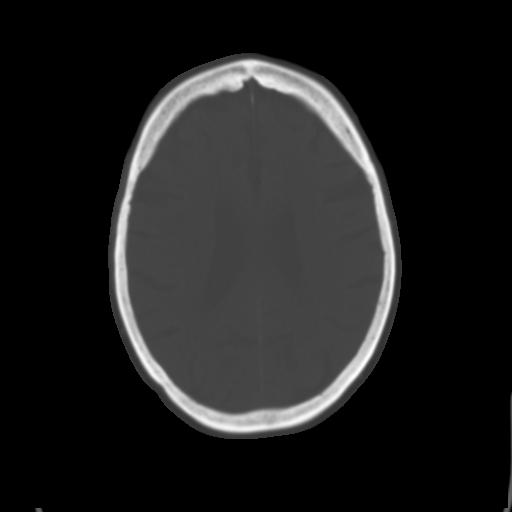
[im 19/27  brain]
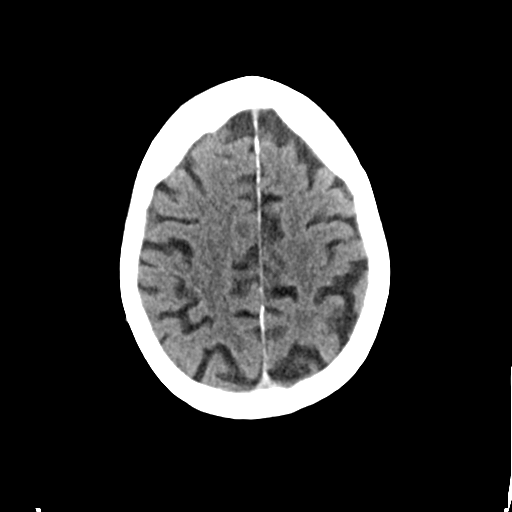
[im 22/27  brain]
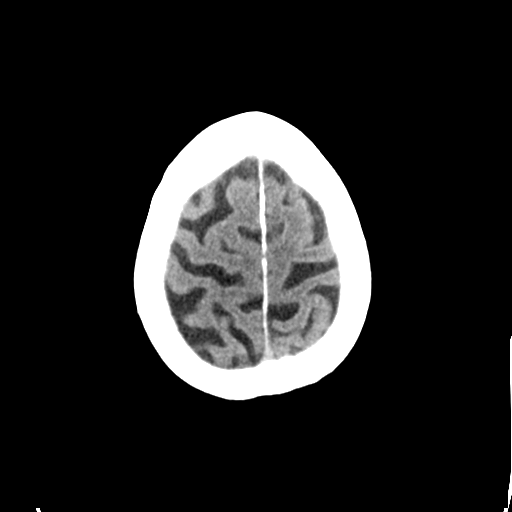
[im 25/27  brain]
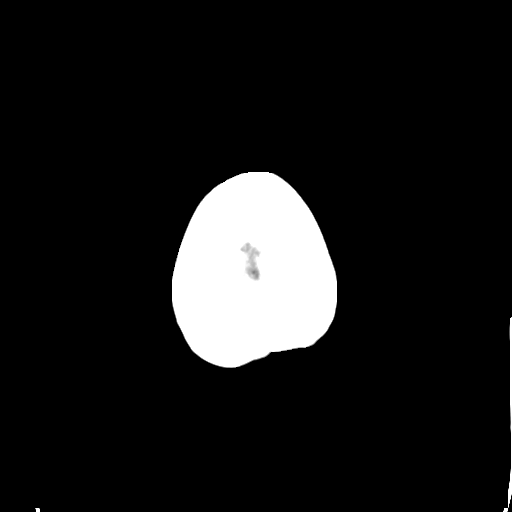

[Series 4: coronal soft tissue · coronal · 0.26mm/px · 3 of 64 slices shown]
[im 22/64  brain]
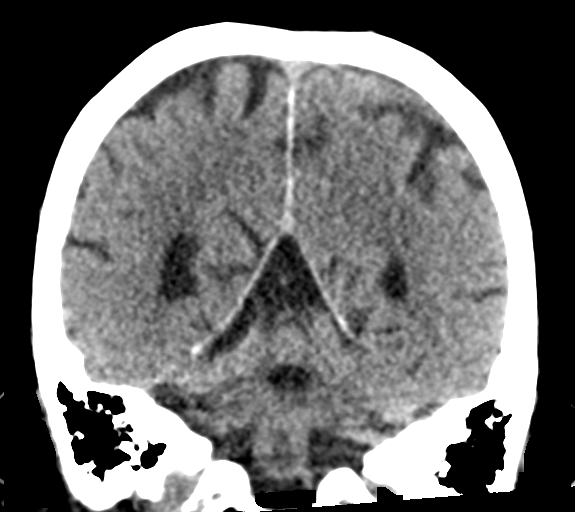
[im 29/64  brain]
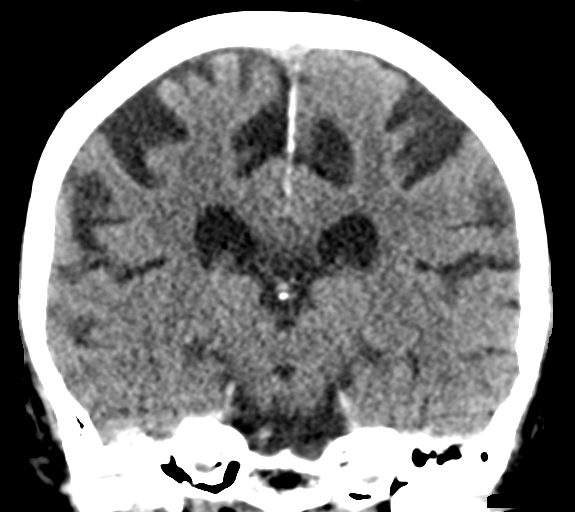
[im 36/64  brain]
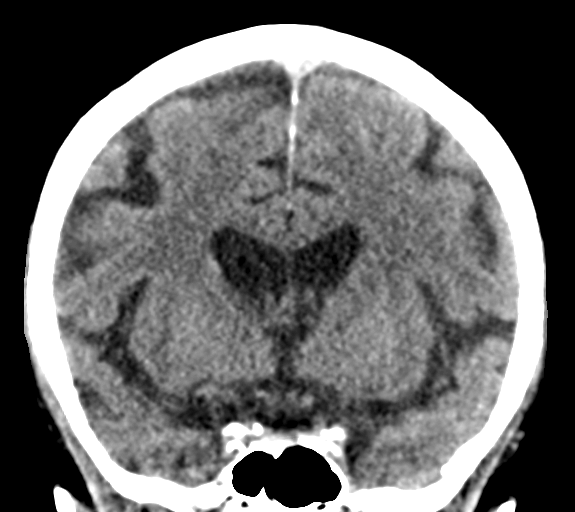

[Series 5: sagittal soft tissue · sagittal · 0.26mm/px · 3 of 50 slices shown]
[im 17/50  brain]
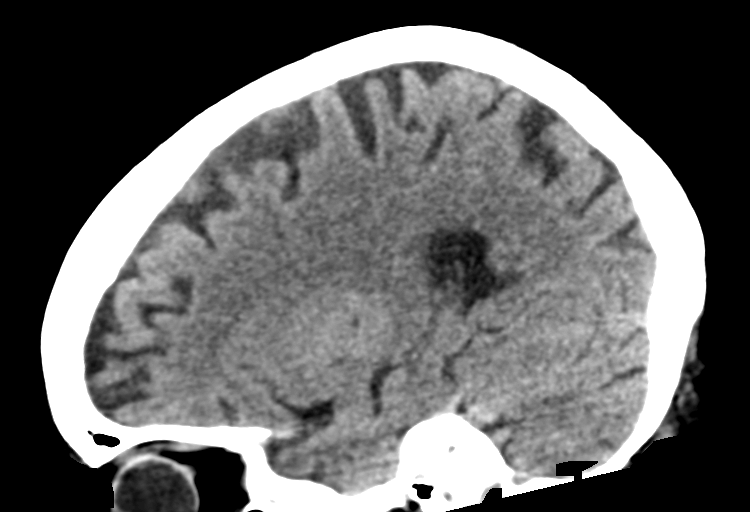
[im 25/50  brain]
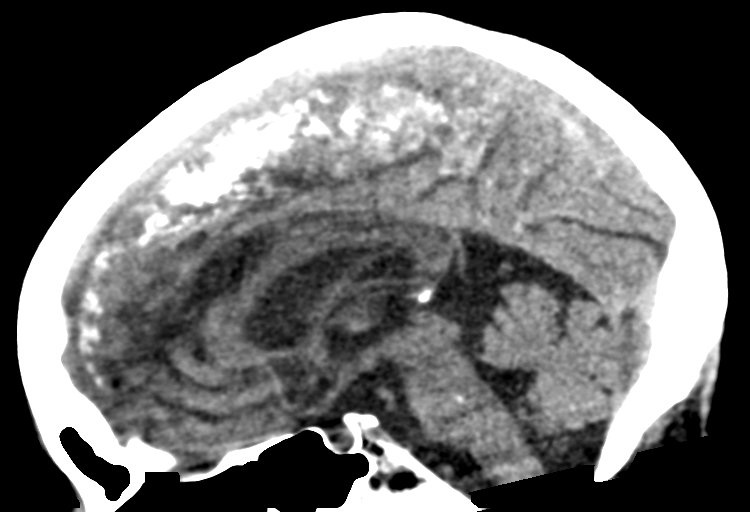
[im 33/50  brain]
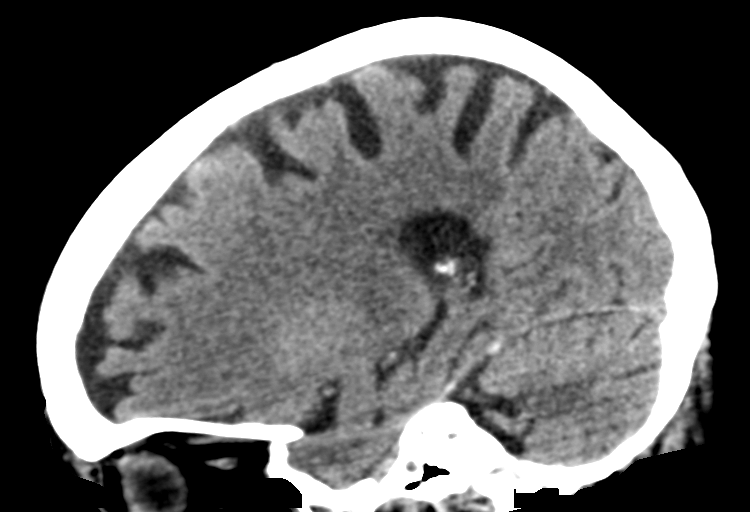

[14 of 44 positions shown; findings below may reference images not displayed]

FINDINGS: CT HEAD FINDINGS

Brain: No evidence of acute infarction, hemorrhage, hydrocephalus,
extra-axial collection or mass lesion/mass effect. Stable mild
atrophy.

Vascular: Calcified atherosclerosis at the skullbase. No hyperdense
vessel.

Skull: Normal. Negative for fracture or focal lesion.

Other: Right forehead laceration.

CT MAXILLOFACIAL FINDINGS

Osseous: No fracture or mandibular dislocation. No destructive
process.

Orbits: Small right periorbital hematoma.  Otherwise negative.

Sinuses: Clear.

Soft tissues: Negative.

CT CERVICAL SPINE FINDINGS

Alignment: No traumatic malalignment. Reversal of the normal
cervical lordosis.

Skull base and vertebrae: No acute fracture. No primary bone lesion
or focal pathologic process.

Soft tissues and spinal canal: No prevertebral fluid or swelling. No
visible canal hematoma.

Disc levels: Diffuse multilevel spondylosis, moderate to severe at
C5-C6 and C6-C7.

Upper chest: Negative.

Other: None.
IMPRESSION: CT head:

1.  No acute intracranial abnormality.
2. Right forehead laceration.

CT maxillofacial:

1.  No acute maxillofacial fracture.
2. Small right periorbital hematoma.

CT cervical spine:

1.  No acute cervical spine fracture.

## 2020-09-15 ENCOUNTER — Other Ambulatory Visit
Admission: RE | Admit: 2020-09-15 | Discharge: 2020-09-15 | Disposition: A | Discharge status: Home / Self Care | Payer: Medicare Other | Source: Ambulatory Visit | Attending: Internal Medicine | Admitting: Internal Medicine

## 2020-09-15 ENCOUNTER — Other Ambulatory Visit: Payer: Self-pay

## 2020-09-15 DIAGNOSIS — Z01812 Encounter for preprocedural laboratory examination: Secondary | ICD-10-CM | POA: Diagnosis present

## 2020-09-15 DIAGNOSIS — Z20822 Contact with and (suspected) exposure to covid-19: Secondary | ICD-10-CM | POA: Diagnosis not present

## 2020-09-16 ENCOUNTER — Other Ambulatory Visit: Admission: RE | Admit: 2020-09-16 | Payer: Medicare Other | Source: Ambulatory Visit

## 2020-09-16 LAB — SARS CORONAVIRUS 2 (TAT 6-24 HRS): SARS Coronavirus 2: NEGATIVE

## 2020-09-17 ENCOUNTER — Ambulatory Visit: Payer: Medicare Other | Attending: Neurology

## 2020-09-17 DIAGNOSIS — Z9989 Dependence on other enabling machines and devices: Secondary | ICD-10-CM | POA: Insufficient documentation

## 2020-09-17 DIAGNOSIS — G4733 Obstructive sleep apnea (adult) (pediatric): Secondary | ICD-10-CM | POA: Insufficient documentation

## 2020-09-18 ENCOUNTER — Other Ambulatory Visit: Payer: Self-pay

## 2021-01-13 ENCOUNTER — Other Ambulatory Visit: Payer: Self-pay

## 2021-01-13 MED ORDER — CLOBETASOL PROPIONATE 0.05 % EX OINT: 1.0000 "application " | TOPICAL_OINTMENT | Freq: Every day | CUTANEOUS | 0 refills | Status: DC | PRN

## 2021-01-13 NOTE — Progress Notes (Signed)
Patient called asking for RF of Clobetasol. RX sent in thinking patient was here in November until I realized it was November of 2021. Patient advised she needs an appt and no RFs can be given at this time. Patient scheduled and RX that was sent in to [facility] was cancelled.

## 2021-01-17 DIAGNOSIS — J189 Pneumonia, unspecified organism: Secondary | ICD-10-CM

## 2021-01-17 HISTORY — DX: Pneumonia, unspecified organism: J18.9

## 2021-01-25 ENCOUNTER — Encounter
Admission: RE | Admit: 2021-01-25 | Discharge: 2021-01-25 | Disposition: A | Discharge status: Home / Self Care | Payer: Medicare Other | Source: Ambulatory Visit | Attending: Obstetrics and Gynecology | Admitting: Obstetrics and Gynecology

## 2021-01-25 ENCOUNTER — Other Ambulatory Visit: Payer: Self-pay

## 2021-01-25 VITALS — Ht 61.0 in | Wt 122.0 lb

## 2021-01-25 DIAGNOSIS — Z01812 Encounter for preprocedural laboratory examination: Secondary | ICD-10-CM

## 2021-01-25 DIAGNOSIS — N183 Chronic kidney disease, stage 3 unspecified: Secondary | ICD-10-CM

## 2021-01-25 DIAGNOSIS — E039 Hypothyroidism, unspecified: Secondary | ICD-10-CM

## 2021-01-25 DIAGNOSIS — I10 Essential (primary) hypertension: Secondary | ICD-10-CM
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
## 2021-01-25 NOTE — Pre-Procedure Instructions (Signed)
Progress Notes - documented in this encounter Abigail Walker, [place] - 09/15/2020 2:00 PM EDT Formatting of this note is different from the original. Established Patient Visit   Chief Complaint: No chief complaint on file. Date of Service: 09/15/2020 Date of Birth: September 11, 1945 PCP: Abigail Donath, MD  History of Present Illness: [name] is a 76 y.o.female patient who was referred for evaluation of cerebrovascular accident, essential hypertension, hyperlipidemia, and type 2 diabetes. The patient has a history of frequent falls she recently was experiencing visual disturbances. Underwent neurological evaluation MRI 01/02/2020 revealing small subacute infarcts of the right occipital lobe. The patient currently is on Plavix 75 mg daily. ECG 01/09/2020 revealed sinus rhythm with left bundle branch block. 14-day Holter monitor was performed which revealed predominant sinus rhythm with mean heart rate of 90 bpm, intermittent IVCD, infrequent premature ventricular contractions, rare premature atrial contractions, without atrial fibrillation. 2D echocardiogram 03/03/2020 revealed normal left ventricular function, with LVEF 50%, with mild mitral and tricuspid regurgitation.  The patient returns today for a 48-monthfollow-up and reports feeling "great." She denies chest pain. She has mild chronic exertional shortness of breath, which is unchanged. She denies palpitations or heart racing. She denies presyncope or syncope. She has mild intermittent lower extremity edema on torsemide. She is active outdoors with yard work and gardening.   The patient has essential hypertension, blood pressure well controlled today, currently on lisinopril and torsemide, which are well-tolerated without apparent side effects. The patient follows a low-sodium, no added salt diet.   The patient has hyperlipidemia, on rosuvastatin, which is well-tolerated without apparent side effects, followed by her primary care  provider. LDL cholesterol was 86 on 02/06/20. The patient follows a low-cholesterol, low-fat diet.   The patient has type 2 diabetes, on insulin and metformin, which are well-tolerated without apparent side effects, followed by her primary care provider. Hemoglobin A1c was 10.7 on 02/06/20.  Past Medical and Surgical History  Past Medical History Past Medical History:  Diagnosis Date   Depression   Diabetic neuropathy (CMS-HCC)   Essential hypertension, benign   GERD (gastroesophageal reflux disease)  Associated with severe nausea   Hepatitis B   Lichen planus   Microalbuminuria due to type 2 diabetes mellitus (CMS-HCC)   Pure hypercholesterolemia   Restless leg syndrome   Skin cancer, basal cell 2011  Excision   Sleep apnea   Type 2 diabetes mellitus (CMS-HCC) 19163 complicated by microalbuminuria and diabetic neuropathy   Type II or unspecified type diabetes mellitus without mention of complication, uncontrolled   Unspecified hypothyroidism   Past Surgical History She has a past surgical history that includes Tonsillectomy; Extraction Teeth; Tubal ligation (Bilateral); Hemorrhoid surgery; Basal cell skin cancer excision; Colonoscopy (02/22/2007); egd (02/22/2007, 10/22/2009); and egd (06/08/2015).   Medications and Allergies  Current Medications  Current Outpatient Medications  Medication Sig Dispense Refill   acetaminophen (TYLENOL) 500 MG tablet Take 1,000 mg by mouth as needed for Pain   blood glucose diagnostic (GLUCOSE BLOOD) test strip 1 each (1 strip total) 3 (three) times daily for 30 days Use as instructed. 200 each 11   blood glucose meter kit by XX route 2 (two) times daily for 30 days 1 each 0   buPROPion (WELLBUTRIN XL) 150 MG XL tablet Take 3 tablet po QD as directed   cholecalciferol (VITAMIN D3) 2,000 unit tablet Take by mouth   clopidogreL (PLAVIX) 75 mg tablet Take 1 tablet (75 mg total) by mouth once daily 90 tablet  11   ferrous sulfate 325 (65 FE) MG tablet  Take 325 mg by mouth once daily   insulin GLARGINE (LANTUS) injection (concentration 100 units/mL) Inject 17 Units subcutaneously nightly 15 mL 11   insulin syringe-needle U-100 1 mL 31 gauge x 5/16 syringe 2 (two) times daily 200 each 3   lancing device with lancets kit Use 1 each 3 (three) times daily Use as instructed. 100 each 11   levothyroxine (SYNTHROID) 75 MCG tablet Take 1 tablet (75 mcg total) by mouth once daily Take on an empty stomach with a glass of water at least 30-60 minutes before breakfast. 90 tablet 2   lidocaine-diphenhydramine-aluminum-magnesium-simethicone (FIRST MOUTHWASH BLM) oral suspension Take 5 mLs by mouth every 6 (six) hours as needed 300 mL 2   lisinopriL (ZESTRIL) 5 MG tablet Take 5 mg by mouth once daily (Patient not taking: Reported on 08/13/2020)   magnesium oxide 400 mg magnesium Tab Take by mouth once daily (Patient not taking: Reported on 02/01/7260)   ONETOUCH DELICA PLUS LANCET USE 1 TO CHECK GLUCOSE THREE TIMES DAILY AS DIRECTED   PARoxetine (PAXIL) 40 MG tablet Take 40 mg by mouth once daily   pen needle, diabetic 31 gauge x 3/16" needle Use.   rosuvastatin (CRESTOR) 40 MG tablet Take 1 tablet (40 mg total) by mouth once daily 30 tablet 11   TORsemide (DEMADEX) 10 MG tablet Take 1 tablet by mouth Every other day   No current facility-administered medications for this visit.   Allergies: Metformin, Other, Sulfa (sulfonamide antibiotics), Sulfasalazine, Alcortin a [hydrocortisone-iodoquinl-aloe2], Amoxicillin-pot clavulanate, and Hydrocortisone-iodoquinol  Social and Family History  Social History reports that she quit smoking about 25 years ago. Her smoking use included cigarettes. She has never used smokeless tobacco. She reports that she does not drink alcohol and does not use drugs.  Family History Family History  Problem Relation Age of Onset   Bipolar disorder Mother   Other Mother  multi-organ failure   Diverticulosis Father   Thrombosis  Father   Coronary Artery Disease (Blocked arteries around heart) Father   Bipolar disorder Daughter   Other Daughter  drug addiction   Diabetes Paternal Grandmother   Review of Systems   Review of Systems: The patient denies chest pain, shortness of breath, orthopnea, paroxysmal nocturnal dyspnea, pedal edema, palpitations, heart racing, presyncope, syncope, with hearing loss. Review of 8 Systems is negative except as described above.  Physical Examination   Vitals:BP 124/64   Pulse 88   Wt 58.5 kg (129 lb)   SpO2 95%   BMI 24.37 kg/m  Ht: Wt:58.5 kg (129 lb) MBT:DHRC surface area is 1.59 meters squared. Body mass index is 24.37 kg/m.  General: Alert and oriented. Well-appearing. No acute distress. HEENT: Pupils equally reactive to light and accomodation  Neck: Supple Lungs: Normal effort of breathing; clear to auscultation bilaterally; no wheezes, rales, rhonchi Heart: Regular rate and rhythm. No murmur, rub, or gallop Abdomen: soft nontender, nondistended Extremities: no cyanosis, clubbing, or edema Peripheral Pulses: 2+ radial  Skin: Warm, dry, no diaphoresis  Assessment   76 y.o. female with  1. HTN (hypertension), benign  2. SOB (shortness of breath) on exertion  3. OSA on CPAP  4. Pure hypercholesterolemia   76 year old female with frequent falls, diagnosed with subacute infarcts right occipital lobe, currently on Plavix. Patient has multiple cardiovascular risk factors including essential hypertension, hyperlipidemia and type 2 diabetes. The patient denies chest pain, but reports chronic exertional dyspnea. 14-day Holter monitor revealed predominant sinus  rhythm without significant atrial fibrillation. 2D echocardiogram revealed normal left ventricular function, with mild valvular abnormalities. The patient has essential hypertension and hyperlipidemia, on rosuvastatin. She denies recurrent stroke-like symptoms.  Plan   1. Continue current medications 2. Counseled  patient about low-sodium diet 3. DASH diet printed instructions given to the patient 4. Counseled patient about low-cholesterol diet 5. Continue rosuvastatin for hyperlipidemia instrument  6. Low-fat and cholesterol diet printed instructions given to the patient 7. Continue Plavix, defer chronic anticoagulation 8. Consider additional prolonged cardiac monitoring for evaluation of subclinical atrial fibrillation with history of stroke; defer to neurology 9. Return to clinic for follow-up in 47-month No orders of the defined types were placed in this encounter.  Return in about 6 months (around 03/16/2021). I personally performed the service, non-incident to. (WP)   ANNA MShirlee More PA-C   Electronically signed by DHalford Walker PBroadlandsat 09/15/2020 3:22 PM EDT

## 2021-01-25 NOTE — Patient Instructions (Addendum)
Your procedure is scheduled on: Monday, January 16 Report to the Registration Desk on the 1st floor of the [facility]. To find out your arrival time, please call [phone] between 1PM - 3PM on: Friday, January 13  REMEMBER: Instructions that are not followed completely may result in serious medical risk, up to and including death; or upon the discretion of your surgeon and anesthesiologist your surgery may need to be rescheduled.  Do not eat food after midnight the night before surgery.  No gum chewing, lozengers or hard candies.  You may however, drink water up to 2 hours before you are scheduled to arrive for your surgery. Do not drink anything within 2 hours of your scheduled arrival time.  TAKE THESE MEDICATIONS THE MORNING OF SURGERY WITH A SIP OF WATER:  Levothyroxine  Only take 1/2 (half) of your lantus insulin the night before surgery.  11 units  Do not take any insulin on the day of surgery.  Follow recommendations from Cardiologist, Pulmonologist or PCP regarding stopping clopidogrel (Plavix).  According to patient, telephone call to Dr. Ouida Sills, stop taking clopidogrel (plavix) 5 days prior to surgery. Last day to take is Tuesday, January 10. Resume AFTER surgery per surgeon instructions.  One week prior to surgery: Stop Anti-inflammatories (NSAIDS) such as Advil, Aleve, Ibuprofen, Motrin, Naproxen, Naprosyn and Aspirin based products such as Excedrin, Goodys Powder, BC Powder. Stop ANY OVER THE COUNTER supplements until after surgery. You may however, continue to take Tylenol if needed for pain up until the day of surgery.  No Alcohol for 24 hours before or after surgery.  No Smoking including e-cigarettes for 24 hours prior to surgery.   On the morning of surgery brush your teeth with toothpaste and water, you may rinse your mouth with mouthwash if you wish. Do not swallow any toothpaste or mouthwash.  Do not wear jewelry, make-up, hairpins, clips or nail  polish.  Do not wear lotions, powders, or perfumes.   Do not shave body from the neck down 48 hours prior to surgery just in case you cut yourself which could leave a site for infection.   Contact lenses, hearing aids and dentures may not be worn into surgery.  Do not bring valuables to the hospital. [facility] is not responsible for any missing/lost belongings or valuables.   Notify your doctor if there is any change in your medical condition (cold, fever, infection).  Wear comfortable clothing (specific to your surgery type) to the hospital.  After surgery, you can help prevent lung complications by doing breathing exercises.  Take deep breaths and cough every 1-2 hours. Your doctor may order a device called an Incentive Spirometer to help you take deep breaths.  If you are being discharged the day of surgery, you will not be allowed to drive home. You will need a responsible adult (18 years or older) to drive you home and stay with you that night.   If you are taking public transportation, you will need to have a responsible adult (18 years or older) with you. Please confirm with your physician that it is acceptable to use public transportation.   Please call the [facility] at [phone] if you have any questions about these instructions.  Surgery Visitation Policy:  Patients undergoing a surgery or procedure may have one family member or support person with them as long as that person is not COVID-19 positive or experiencing its symptoms.  That person may remain in the waiting area during  the procedure and may rotate out with other people.

## 2021-01-26 ENCOUNTER — Encounter
Admission: RE | Admit: 2021-01-26 | Discharge: 2021-01-26 | Disposition: A | Discharge status: Home / Self Care | Payer: Medicare Other | Source: Ambulatory Visit | Attending: Obstetrics and Gynecology | Admitting: Obstetrics and Gynecology

## 2021-01-26 DIAGNOSIS — I129 Hypertensive chronic kidney disease with stage 1 through stage 4 chronic kidney disease, or unspecified chronic kidney disease: Secondary | ICD-10-CM | POA: Insufficient documentation

## 2021-01-26 DIAGNOSIS — Z01812 Encounter for preprocedural laboratory examination: Secondary | ICD-10-CM

## 2021-01-26 DIAGNOSIS — E1122 Type 2 diabetes mellitus with diabetic chronic kidney disease: Secondary | ICD-10-CM | POA: Insufficient documentation

## 2021-01-26 DIAGNOSIS — Z01818 Encounter for other preprocedural examination: Secondary | ICD-10-CM | POA: Diagnosis not present

## 2021-01-26 DIAGNOSIS — I10 Essential (primary) hypertension: Secondary | ICD-10-CM

## 2021-01-26 DIAGNOSIS — N183 Chronic kidney disease, stage 3 unspecified: Secondary | ICD-10-CM | POA: Insufficient documentation

## 2021-01-26 DIAGNOSIS — E039 Hypothyroidism, unspecified: Secondary | ICD-10-CM | POA: Insufficient documentation

## 2021-01-26 LAB — CBC
HCT: 40.1 % (ref 36.0–46.0)
Hemoglobin: 13.3 g/dL (ref 12.0–15.0)
MCH: 30 pg (ref 26.0–34.0)
MCHC: 33.2 g/dL (ref 30.0–36.0)
MCV: 90.3 fL (ref 80.0–100.0)
Platelets: 140 10*3/uL — ABNORMAL LOW (ref 150–400)
RBC: 4.44 MIL/uL (ref 3.87–5.11)
RDW: 12.7 % (ref 11.5–15.5)
WBC: 3.5 10*3/uL — ABNORMAL LOW (ref 4.0–10.5)
nRBC: 0 % (ref 0.0–0.2)

## 2021-01-26 LAB — BASIC METABOLIC PANEL
Anion gap: 5 (ref 5–15)
BUN: 18 mg/dL (ref 8–23)
CO2: 29 mmol/L (ref 22–32)
Calcium: 9.3 mg/dL (ref 8.9–10.3)
Chloride: 102 mmol/L (ref 98–111)
Creatinine, Ser: 1.12 mg/dL — ABNORMAL HIGH (ref 0.44–1.00)
GFR, Estimated: 51 mL/min — ABNORMAL LOW (ref >60)
Glucose, Bld: 247 mg/dL — ABNORMAL HIGH (ref 70–99)
Potassium: 4.9 mmol/L (ref 3.5–5.1)
Sodium: 136 mmol/L (ref 135–145)

## 2021-01-26 NOTE — H&P (Signed)
Abigail Walker is a 76 y.o. female here for Pre Op Consulting (Sign consents/schedule surgery) and Follow-up . She was last seen in May 2022   History of Present Illness: Patient presents for evaluation of postmenopausal bleeding:. She had an annual exam with me in 05/2020. At this time she mentioned vaginal bleeding and on exam, blood was in vault. Pap was unable to be collected due to lichen and loss of architecture and stenosis. Pt would not be able to tolerate an EMBx in the office. We discussed D&C or Exam under Anesthesia in OR. She returns today for surgical evaluation. She did not show for her ultrasound.    Today: Concerned about bleeding and pain after the procedure.    Pertinent hx:   -On derm exam 11/2019: labia minora with severe scarring with loss of normal anatomy, right labia minora ulcerated, erythema improving w/ less ulceration of left labia minora -Dx oral & genital lichen planus; clobetasol & esterase for genitalia, Tacrolimus for mouth              -Derm in 09/2019 increased clobetasol ointment to vaginal opening & labia BID                   -"Possible lichen sclerosus" per derm, with "extensive scarring"    -MUI; s/p urology, s/p percutaneous tibial nerve stimulation tx in 2016-2017 -Vaginal irritation secondary to incontinence, atrophy too; vaginal estrogen helps with vulvar burning & irritation w/ urination - 4 weeks of "spotting" in 03/22 with small amount of bright red bloo dvisible on pad   -HTN, on meds -CKD stage 3 -MGUS (monocloncal gammopathy of unknown significance); follows onc   -Occipital stroke in late 2021; cognitive impairment, frequent falls; follows neuro -Hx of major depression, on meds -Charlie bonnet syndrome; 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 right ear, a lady is speaking on the phone professionally in her left ear              -Per ear doctor, her hearing loss has triggered her "brain decide to want to hear something" -Weight loss of 175 lbs over the course of 15  years      Past Medical History:  has a past medical history of Depression, Diabetic neuropathy (CMS-HCC), Essential hypertension, benign, GERD (gastroesophageal reflux disease), Hepatitis B, Lichen planus, Microalbuminuria due to type 2 diabetes mellitus (CMS-HCC), Pure hypercholesterolemia, Restless leg syndrome, Skin cancer, basal cell (2011), Sleep apnea, Type 2 diabetes mellitus (CMS-HCC) (1992), Type II or unspecified type diabetes mellitus without mention of complication, uncontrolled, and Unspecified hypothyroidism.  Past Surgical History:  has a past surgical history that includes Tonsillectomy; Extraction Teeth; Tubal ligation (Bilateral); Hemorrhoid surgery; Basal cell skin cancer excision; Colonoscopy (02/22/2007); egd (02/22/2007, 10/22/2009); and egd (06/08/2015). Family History: family history includes Bipolar disorder in her daughter and mother; Coronary Artery Disease (Blocked arteries around heart) in her father; Diabetes in her paternal grandmother; Diverticulosis in her father; Other in her daughter and mother; Thrombosis in her father. Social History:  reports that she quit smoking about 25 years ago. Her smoking use included cigarettes. She has never used smokeless tobacco. She reports that she does not drink alcohol and does not use drugs. OB/GYN History:  OB History     Gravida 1   Para 1   Term 1   Preterm     AB     Living 1     SAB     IAB     Ectopic  Molar     Multiple     Live Births            Allergies: is allergic to metformin, other, sulfa (sulfonamide antibiotics), sulfasalazine, alcortin a [hydrocortisone-iodoquinl-aloe2], amoxicillin-pot clavulanate, and hydrocortisone-iodoquinol. Medications: Current Outpatient Medications:    acetaminophen (TYLENOL) 500 MG tablet, Take 1,000 mg by mouth as needed for Pain, Disp: , Rfl:    blood glucose diagnostic (GLUCOSE BLOOD) test strip, 1 each (1 strip total) 3 (three) times daily  for 30 days Use as instructed., Disp: 200 each, Rfl: 11   blood glucose meter kit, by XX route 2 (two) times daily for 30 days, Disp: 1 each, Rfl: 0   buPROPion (WELLBUTRIN XL) 150 MG XL tablet, Take 3 tablet po QD as directed, Disp: , Rfl:    cholecalciferol (VITAMIN D3) 2,000 unit tablet, Take by mouth, Disp: , Rfl:    clopidogreL (PLAVIX) 75 mg tablet, , Disp: , Rfl:    ferrous sulfate 325 (65 FE) MG tablet, Take 325 mg by mouth once daily   , Disp: , Rfl:    insulin GLARGINE (LANTUS) injection (concentration 100 units/mL), Inject 23 Units subcutaneously at bedtime, Disp: 15 mL, Rfl: 3   insulin syringe-needle U-100 1 mL 31 gauge x 5/16 syringe, 2 (two) times daily, Disp: 200 each, Rfl: 3   lancing device with lancets kit, Use 1 each 3 (three) times daily Use as instructed., Disp: 100 each, Rfl: 11   levothyroxine (SYNTHROID) 75 MCG tablet, Take 1 tablet (75 mcg total) by mouth once daily Take on an empty stomach with a glass of water at least 30-60 minutes before breakfast., Disp: 90 tablet, Rfl: 2   lisinopriL (ZESTRIL) 5 MG tablet, Take 5 mg by mouth once daily, Disp: , Rfl:    magnesium oxide 400 mg magnesium Tab, Take by mouth once daily, Disp: , Rfl:    ONETOUCH DELICA PLUS LANCET, 3 (three) times daily Use as instructed., Disp: 100 each, Rfl: 5   PARoxetine (PAXIL) 20 MG tablet, , Disp: , Rfl:    PARoxetine (PAXIL) 40 MG tablet, Take 40 mg by mouth once daily   , Disp: , Rfl:    pen needle, diabetic 31 gauge x 3/16" needle, Use., Disp: , Rfl:    TORsemide (DEMADEX) 10 MG tablet, Take 1 tablet by mouth Every other day, Disp: , Rfl:    lidocaine-diphenhydramine-aluminum-magnesium-simethicone (FIRST MOUTHWASH BLM) oral suspension, Take 5 mLs by mouth every 6 (six) hours as needed (Patient not taking: Reported on 11/12/2020), Disp: 300 mL, Rfl: 2   rosuvastatin (CRESTOR) 40 MG tablet, Take 1 tablet (40 mg total) by mouth once daily, Disp: 30 tablet, Rfl: 11     Review of Systems: See  HPI    Exam:   BP (!) 142/83    Pulse 69    Ht 154.9 cm (5' 1" )    Wt 57.2 kg (126 lb)    BMI 23.81 kg/m  Body mass index is 23.81 kg/m.     Constitutional:  General appearance: Well nourished, well developed female in no acute distress.  Neuro/psych:  Normal mood and affect. No gross motor deficits. Neck:  Supple, normal appearance.  Respiratory:  Normal respiratory effort.  Cardiovascular:  No lower extremity edema.  Skin:  No rashes, ulcers, or skin lesions noted. No excessive hirsutism or acne noted. Warm and dry.  Abdomen: soft , no mass, non-tender, no rebound tenderness. Pelvic: deferred (Prior exam with significant limited view, stenosis and patient pain)  Impression:   The primary encounter diagnosis was Post-menopausal bleeding. Diagnoses of Atrophic vaginitis and Lichen planus were also pertinent to this visit.   Plan:   1. Postmenopausal bleeding with vaginal stenosis:  We discussed the possibilities of atrophy, benign endometrial polyp, hyperplasia and cancer. We discussed hysteroscopic polypectomy for the result of polyp. We discussed the possibilities of hyperplasia, treatment option dependent upon grade. We discussed oncology referral for carcinoma.   Her pelvic exam was very limited due to lichen, loss of architecture, vaginal stenosis, and pain. I recommend an exam under anesthesia due to patient's level of discomfort and extent of limited pelvic exam. Patient is amendable to an exam under anesthesia. I discussed with her my recommendation for vaginal dilators prior to the procedure so that her recovery following could be faster and easier for her.    We discussed that she should anticipate some pain and bleeding following the procedure.    Risks of surgery were discussed with the patient including but not limited to: bleeding which may require transfusion; infection which may require antibiotics; injury to uterus or surrounding organs; intrauterine scarring  which may impair future fertility; need for additional procedures including laparotomy or laparoscopy; and other postoperative/anesthesia complications. Written informed consent was obtained.   This is a scheduled same-day surgery. She will have a postop visit in 2 weeks to review operative findings and pathology.

## 2021-02-01 ENCOUNTER — Encounter: Admission: RE | Payer: Self-pay | Source: Home / Self Care

## 2021-02-01 ENCOUNTER — Ambulatory Visit
Admission: RE | Admit: 2021-02-01 | Payer: Medicare Other | Source: Home / Self Care | Admitting: Obstetrics and Gynecology

## 2021-02-01 DIAGNOSIS — N950 Postmenopausal bleeding: Secondary | ICD-10-CM

## 2021-02-01 SURGERY — EXAM UNDER ANESTHESIA
Anesthesia: Choice

## 2021-02-01 MED ORDER — SODIUM CHLORIDE 0.9 % IV SOLN: INTRAVENOUS | Status: DC

## 2021-02-01 MED ORDER — LACTATED RINGERS IV SOLN: INTRAVENOUS | Status: DC

## 2021-02-01 MED ORDER — FAMOTIDINE 20 MG PO TABS: 20.0000 mg | ORAL_TABLET | Freq: Once | ORAL | Status: DC

## 2021-02-10 ENCOUNTER — Encounter: Payer: Self-pay | Admitting: Dermatology

## 2021-02-10 ENCOUNTER — Ambulatory Visit (INDEPENDENT_AMBULATORY_CARE_PROVIDER_SITE_OTHER): Payer: Medicare Other | Admitting: Dermatology

## 2021-02-10 ENCOUNTER — Other Ambulatory Visit: Payer: Self-pay

## 2021-02-10 DIAGNOSIS — L439 Lichen planus, unspecified: Secondary | ICD-10-CM | POA: Diagnosis not present

## 2021-02-10 DIAGNOSIS — L905 Scar conditions and fibrosis of skin: Secondary | ICD-10-CM

## 2021-02-10 MED ORDER — CLOBETASOL PROPIONATE 0.05 % EX OINT: 1.0000 "application " | TOPICAL_OINTMENT | Freq: Every day | CUTANEOUS | 1 refills | Status: DC

## 2021-02-10 NOTE — Progress Notes (Signed)
° °  Follow-Up Visit   Subjective  Abigail Walker is a 76 y.o. female who presents for the following: Rash (Here for check up and RF of Clobetasol ointment. She states she uses this every day. Hx of severe vaginal LSetA/lichen planus. Patient strongly defers visual exam of area today. Has seen Dr Benjaman Kindler, unable to do internal exam due to severe atrophy of area (has grown together, per patient). Patient has not had surgery as she does not have any one at home to help her during recovery).  She states that the clobetasol ointment helps a lot with her symptoms, and she has run out and needs a rf.    The following portions of the chart were reviewed this encounter and updated as appropriate:      Review of Systems: No other skin or systemic complaints except as noted in HPI or Assessment and Plan.   Objective  Well appearing patient in no apparent distress; mood and affect are within normal limits.  A focused examination was performed including face, hands. Relevant physical exam findings are noted in the Assessment and Plan.  vaginal area Patient refused examination of area today.  Previous exam showed severe scarring with loss of normal architecture and vaginal stenosis   Assessment & Plan  Lichen planus vaginal area  Lichen Planus/LS et A - Chronic, Severe with scarring  Patient refused exam today.  She also recently canceled her scheduled surgery with Dr. Shary Decamp.  States that she wants Dr. Shary Decamp to clinically follow her.  Recommend follow up with Dr. Leafy Ro for exam/treatment.   Lichen Planus/Lichen sclerosus is a chronic inflammatory condition that is NOT sexually transmitted. It requires regular monitoring and treatment to minimize inflammation to reduce risk of scarring. There is also a risk of cancer in the area which is very low if inflammation is well controlled. Regular checks of the area are recommended. Please call if you notice any new or changing spots within this  area.  Continue Clobetasol ointment qd/bid to vaginal area.   Topical steroids (such as triamcinolone, fluocinolone, fluocinonide, mometasone, clobetasol, halobetasol, betamethasone, hydrocortisone) can cause thinning and lightening of the skin if they are used for too long in the same area. Your physician has selected the right strength medicine for your problem and area affected on the body. Please use your medication only as directed by your physician to prevent side effects.    clobetasol ointment (TEMOVATE) 0.05 % - vaginal area Apply 1 application topically daily. vagina   Return if symptoms worsen or fail to improve.  I, Emelia Salisbury, CMA, am acting as scribe for Brendolyn Patty, MD.  Documentation: I have reviewed the above documentation for accuracy and completeness, and I agree with the above.  Brendolyn Patty MD

## 2021-02-10 NOTE — Patient Instructions (Addendum)
Recommend follow up yearly with Dr. Leafy Ro.    Topical steroids (such as triamcinolone, fluocinolone, fluocinonide, mometasone, clobetasol, halobetasol, betamethasone, hydrocortisone) can cause thinning and lightening of the skin if they are used for too long in the same area. Your physician has selected the right strength medicine for your problem and area affected on the body. Please use your medication only as directed by your physician to prevent side effects.    If You Need Anything After Your Visit  If you have any questions or concerns for your doctor, please call our main line at [phone] and press option 4 to reach your doctor's medical assistant. If no one answers, please leave a voicemail as directed and we will return your call as soon as possible. Messages left after 4 pm will be answered the following business day.   You may also send [place] a message via [place]. We typically respond to MyChart messages within 1-2 business days.  For prescription refills, please ask your pharmacy to contact our office. Our fax number is [phone].  If you have an urgent issue when the clinic is closed that cannot wait until the next business day, you can page your doctor at the number below.    Please note that while we do our best to be available for urgent issues outside of office hours, we are not available 24/7.   If you have an urgent issue and are unable to reach [place], you may choose to seek medical care at your doctor's office, retail clinic, urgent care center, or emergency room.  If you have a medical emergency, please immediately call 911 or go to the emergency department.  Pager Numbers  - Dr. Nehemiah Massed: [phone]  - Dr. Laurence Ferrari: [phone]  - Dr. Nicole Kindred: [phone]  In the event of inclement weather, please call our main line at [phone] for an update on the status of any delays or closures.  Dermatology Medication Tips: Please keep the boxes that topical  medications come in in order to help keep track of the instructions about where and how to use these. Pharmacies typically print the medication instructions only on the boxes and not directly on the medication tubes.   If your medication is too expensive, please contact our office at [phone] option 4 or send [place] a message through [place].   We are unable to tell what your co-pay for medications will be in advance as this is different depending on your insurance coverage. However, we may be able to find a substitute medication at lower cost or fill out paperwork to get insurance to cover a needed medication.   If a prior authorization is required to get your medication covered by your insurance company, please allow [place] 1-2 business days to complete this process.  Drug prices often vary depending on where the prescription is filled and some pharmacies may offer cheaper prices.  The website www.goodrx.com contains coupons for medications through different pharmacies. The prices here do not account for what the cost may be with help from insurance (it may be cheaper with your insurance), but the website can give you the price if you did not use any insurance.  - You can print the associated coupon and take it with your prescription to the pharmacy.  - You may also stop by our office during regular business hours and pick up a GoodRx coupon card.  - If you need your prescription sent electronically to a different pharmacy, notify our office through [facility]  or by phone at [phone] option 4.     Si Usted Necesita Algo Despus de Su Visita  Tambin puede enviarnos un mensaje a travs de Pharmacist, community. Por lo general respondemos a los mensajes de MyChart en el transcurso de 1 a 2 das hbiles.  Para renovar recetas, por favor pida a su farmacia que se ponga en contacto con nuestra oficina. Harland Dingwall de fax es [place] [phone].  Si tiene un asunto urgente cuando la clnica est  cerrada y que no puede esperar hasta el siguiente da hbil, puede llamar/localizar a su doctor(a) al nmero que aparece a continuacin.   Por favor, tenga en cuenta que aunque hacemos todo lo posible para estar disponibles para asuntos urgentes fuera del horario de [place], no estamos disponibles las 24 horas del da, los 7 das de la [place].   Si tiene un problema urgente y no puede comunicarse con nosotros, puede optar por buscar atencin mdica  en el consultorio de su doctor(a), en una clnica privada, en un centro de atencin urgente o en una sala de emergencias.  Si tiene Engineering geologist, por favor llame inmediatamente al 911 o vaya a la sala de emergencias.  Nmeros de bper  - Dr. Nehemiah Massed: [phone]  - Dra. Moye: [phone]  - Dra. Nicole Kindred: [phone]  En caso de inclemencias del [place], por favor llame a Johnsie Kindred principal al [phone] para una actualizacin sobre el [place] de cualquier retraso o cierre.  Consejos para la medicacin en dermatologa: Por favor, guarde las cajas en las que vienen los medicamentos de uso tpico para ayudarle a seguir las instrucciones sobre dnde y cmo usarlos. Las farmacias generalmente imprimen las instrucciones del medicamento slo en las cajas y no directamente en los tubos del [place].   Si su medicamento es muy caro, por favor, pngase en contacto con Zigmund Daniel llamando al [phone] y presione la opcin 4 o envenos un mensaje a travs de Pharmacist, community.   No podemos decirle cul ser su copago por los medicamentos por adelantado ya que esto es diferente dependiendo de la cobertura de su seguro. Sin embargo, es posible que podamos encontrar un medicamento sustituto a Electrical engineer un formulario para que el seguro cubra el medicamento que se considera necesario.   Si se requiere una autorizacin previa para que su compaa de seguros [place] su medicamento, por favor permtanos de 1 a 2 das hbiles para  completar este proceso.  Los precios de los medicamentos varan con frecuencia dependiendo del Environmental consultant de dnde se surte la receta y alguna farmacias pueden ofrecer precios ms baratos.  El sitio web www.goodrx.com tiene cupones para medicamentos de Airline pilot. Los precios aqu no tienen en cuenta lo que podra costar con la ayuda del seguro (puede ser ms barato con su seguro), pero el sitio web puede darle el precio si no utiliz Research scientist (physical sciences).  - Puede imprimir el cupn correspondiente y llevarlo con su receta a la farmacia.  - Tambin puede pasar por nuestra oficina durante el horario de atencin regular y Charity fundraiser una tarjeta de cupones de GoodRx.  - Si necesita que su receta se enve electrnicamente a una farmacia diferente, informe a nuestra oficina a travs de MyChart de [place] o por telfono llamando al [phone] y presione la opcin 4.

## 2021-05-28 ENCOUNTER — Other Ambulatory Visit: Payer: Self-pay | Admitting: Internal Medicine

## 2021-05-28 DIAGNOSIS — Z1231 Encounter for screening mammogram for malignant neoplasm of breast: Secondary | ICD-10-CM

## 2021-08-10 ENCOUNTER — Other Ambulatory Visit: Payer: Self-pay

## 2021-08-10 ENCOUNTER — Emergency Department: Payer: Medicare Other

## 2021-08-10 ENCOUNTER — Encounter: Payer: Self-pay | Admitting: Emergency Medicine

## 2021-08-10 ENCOUNTER — Observation Stay
Admission: EM | Admit: 2021-08-10 | Discharge: 2021-08-11 | Disposition: A | Discharge status: Home / Self Care | Payer: Medicare Other | Attending: Family Medicine | Admitting: Family Medicine

## 2021-08-10 DIAGNOSIS — R600 Localized edema: Secondary | ICD-10-CM | POA: Insufficient documentation

## 2021-08-10 DIAGNOSIS — L439 Lichen planus, unspecified: Secondary | ICD-10-CM | POA: Diagnosis not present

## 2021-08-10 DIAGNOSIS — N189 Chronic kidney disease, unspecified: Secondary | ICD-10-CM | POA: Diagnosis not present

## 2021-08-10 DIAGNOSIS — Z9842 Cataract extraction status, left eye: Secondary | ICD-10-CM | POA: Insufficient documentation

## 2021-08-10 DIAGNOSIS — R7989 Other specified abnormal findings of blood chemistry: Secondary | ICD-10-CM | POA: Insufficient documentation

## 2021-08-10 DIAGNOSIS — Z79890 Hormone replacement therapy (postmenopausal): Secondary | ICD-10-CM | POA: Insufficient documentation

## 2021-08-10 DIAGNOSIS — Z841 Family history of disorders of kidney and ureter: Secondary | ICD-10-CM | POA: Insufficient documentation

## 2021-08-10 DIAGNOSIS — K219 Gastro-esophageal reflux disease without esophagitis: Secondary | ICD-10-CM | POA: Insufficient documentation

## 2021-08-10 DIAGNOSIS — M7989 Other specified soft tissue disorders: Secondary | ICD-10-CM | POA: Diagnosis not present

## 2021-08-10 DIAGNOSIS — Z7902 Long term (current) use of antithrombotics/antiplatelets: Secondary | ICD-10-CM | POA: Insufficient documentation

## 2021-08-10 DIAGNOSIS — R778 Other specified abnormalities of plasma proteins: Secondary | ICD-10-CM | POA: Diagnosis not present

## 2021-08-10 DIAGNOSIS — F32A Depression, unspecified: Secondary | ICD-10-CM | POA: Diagnosis not present

## 2021-08-10 DIAGNOSIS — Z20822 Contact with and (suspected) exposure to covid-19: Secondary | ICD-10-CM | POA: Insufficient documentation

## 2021-08-10 DIAGNOSIS — I700 Atherosclerosis of aorta: Secondary | ICD-10-CM | POA: Insufficient documentation

## 2021-08-10 DIAGNOSIS — E1122 Type 2 diabetes mellitus with diabetic chronic kidney disease: Secondary | ICD-10-CM | POA: Insufficient documentation

## 2021-08-10 DIAGNOSIS — J690 Pneumonitis due to inhalation of food and vomit: Secondary | ICD-10-CM | POA: Diagnosis present

## 2021-08-10 DIAGNOSIS — Z888 Allergy status to other drugs, medicaments and biological substances status: Secondary | ICD-10-CM | POA: Insufficient documentation

## 2021-08-10 DIAGNOSIS — D696 Thrombocytopenia, unspecified: Secondary | ICD-10-CM | POA: Diagnosis not present

## 2021-08-10 DIAGNOSIS — K2100 Gastro-esophageal reflux disease with esophagitis, without bleeding: Secondary | ICD-10-CM | POA: Diagnosis not present

## 2021-08-10 DIAGNOSIS — J189 Pneumonia, unspecified organism: Principal | ICD-10-CM | POA: Insufficient documentation

## 2021-08-10 DIAGNOSIS — G4733 Obstructive sleep apnea (adult) (pediatric): Secondary | ICD-10-CM | POA: Diagnosis not present

## 2021-08-10 DIAGNOSIS — E1142 Type 2 diabetes mellitus with diabetic polyneuropathy: Secondary | ICD-10-CM | POA: Insufficient documentation

## 2021-08-10 DIAGNOSIS — Z794 Long term (current) use of insulin: Secondary | ICD-10-CM | POA: Diagnosis not present

## 2021-08-10 DIAGNOSIS — Z8249 Family history of ischemic heart disease and other diseases of the circulatory system: Secondary | ICD-10-CM | POA: Insufficient documentation

## 2021-08-10 DIAGNOSIS — Z8673 Personal history of transient ischemic attack (TIA), and cerebral infarction without residual deficits: Secondary | ICD-10-CM | POA: Diagnosis not present

## 2021-08-10 DIAGNOSIS — I129 Hypertensive chronic kidney disease with stage 1 through stage 4 chronic kidney disease, or unspecified chronic kidney disease: Secondary | ICD-10-CM | POA: Diagnosis not present

## 2021-08-10 DIAGNOSIS — E785 Hyperlipidemia, unspecified: Secondary | ICD-10-CM | POA: Diagnosis not present

## 2021-08-10 DIAGNOSIS — R079 Chest pain, unspecified: Secondary | ICD-10-CM

## 2021-08-10 DIAGNOSIS — Z961 Presence of intraocular lens: Secondary | ICD-10-CM | POA: Insufficient documentation

## 2021-08-10 DIAGNOSIS — Z79899 Other long term (current) drug therapy: Secondary | ICD-10-CM | POA: Diagnosis not present

## 2021-08-10 DIAGNOSIS — R1310 Dysphagia, unspecified: Secondary | ICD-10-CM | POA: Insufficient documentation

## 2021-08-10 DIAGNOSIS — I4892 Unspecified atrial flutter: Secondary | ICD-10-CM | POA: Insufficient documentation

## 2021-08-10 DIAGNOSIS — R918 Other nonspecific abnormal finding of lung field: Secondary | ICD-10-CM | POA: Insufficient documentation

## 2021-08-10 DIAGNOSIS — E039 Hypothyroidism, unspecified: Secondary | ICD-10-CM | POA: Insufficient documentation

## 2021-08-10 DIAGNOSIS — Z882 Allergy status to sulfonamides status: Secondary | ICD-10-CM | POA: Insufficient documentation

## 2021-08-10 DIAGNOSIS — F329 Major depressive disorder, single episode, unspecified: Secondary | ICD-10-CM | POA: Insufficient documentation

## 2021-08-10 DIAGNOSIS — Z9841 Cataract extraction status, right eye: Secondary | ICD-10-CM | POA: Insufficient documentation

## 2021-08-10 DIAGNOSIS — R072 Precordial pain: Secondary | ICD-10-CM | POA: Insufficient documentation

## 2021-08-10 DIAGNOSIS — E8881 Metabolic syndrome: Secondary | ICD-10-CM | POA: Insufficient documentation

## 2021-08-10 DIAGNOSIS — Z87891 Personal history of nicotine dependence: Secondary | ICD-10-CM | POA: Insufficient documentation

## 2021-08-10 DIAGNOSIS — E1165 Type 2 diabetes mellitus with hyperglycemia: Secondary | ICD-10-CM | POA: Insufficient documentation

## 2021-08-10 LAB — COMPREHENSIVE METABOLIC PANEL
ALT: 18 U/L (ref 0–44)
AST: 28 U/L (ref 15–41)
Albumin: 3.7 g/dL (ref 3.5–5.0)
Alkaline Phosphatase: 73 U/L (ref 38–126)
Anion gap: 9 (ref 5–15)
BUN: 24 mg/dL — ABNORMAL HIGH (ref 8–23)
CO2: 24 mmol/L (ref 22–32)
Calcium: 9.5 mg/dL (ref 8.9–10.3)
Chloride: 106 mmol/L (ref 98–111)
Creatinine, Ser: 1.2 mg/dL — ABNORMAL HIGH (ref 0.44–1.00)
GFR, Estimated: 47 mL/min — ABNORMAL LOW (ref >60)
Glucose, Bld: 220 mg/dL — ABNORMAL HIGH (ref 70–99)
Potassium: 3.9 mmol/L (ref 3.5–5.1)
Sodium: 139 mmol/L (ref 135–145)
Total Bilirubin: 0.9 mg/dL (ref 0.3–1.2)
Total Protein: 6.3 g/dL — ABNORMAL LOW (ref 6.5–8.1)

## 2021-08-10 LAB — URINALYSIS, ROUTINE W REFLEX MICROSCOPIC
Bilirubin Urine: NEGATIVE
Glucose, UA: 50 mg/dL — AB
Hgb urine dipstick: NEGATIVE
Ketones, ur: 5 mg/dL — AB
Nitrite: NEGATIVE
Protein, ur: 100 mg/dL — AB
Specific Gravity, Urine: 1.018 (ref 1.005–1.030)
Squamous Epithelial / HPF: NONE SEEN (ref 0–5)
pH: 6 (ref 5.0–8.0)

## 2021-08-10 LAB — TROPONIN I (HIGH SENSITIVITY)
Troponin I (High Sensitivity): 40 ng/L — ABNORMAL HIGH (ref <18)
Troponin I (High Sensitivity): 46 ng/L — ABNORMAL HIGH (ref <18)

## 2021-08-10 LAB — CBC WITH DIFFERENTIAL/PLATELET
Abs Immature Granulocytes: 0.01 10*3/uL (ref 0.00–0.07)
Basophils Absolute: 0 10*3/uL (ref 0.0–0.1)
Basophils Relative: 0 %
Eosinophils Absolute: 0.1 10*3/uL (ref 0.0–0.5)
Eosinophils Relative: 1 %
HCT: 41.1 % (ref 36.0–46.0)
Hemoglobin: 13.4 g/dL (ref 12.0–15.0)
Immature Granulocytes: 0 %
Lymphocytes Relative: 11 %
Lymphs Abs: 0.7 10*3/uL (ref 0.7–4.0)
MCH: 29.7 pg (ref 26.0–34.0)
MCHC: 32.6 g/dL (ref 30.0–36.0)
MCV: 91.1 fL (ref 80.0–100.0)
Monocytes Absolute: 0.5 10*3/uL (ref 0.1–1.0)
Monocytes Relative: 9 %
Neutro Abs: 4.7 10*3/uL (ref 1.7–7.7)
Neutrophils Relative %: 79 %
Platelets: 143 10*3/uL — ABNORMAL LOW (ref 150–400)
RBC: 4.51 MIL/uL (ref 3.87–5.11)
RDW: 13.1 % (ref 11.5–15.5)
WBC: 6 10*3/uL (ref 4.0–10.5)
nRBC: 0 % (ref 0.0–0.2)

## 2021-08-10 LAB — LIPASE, BLOOD: Lipase: 45 U/L (ref 11–51)

## 2021-08-10 LAB — SARS CORONAVIRUS 2 BY RT PCR: SARS Coronavirus 2 by RT PCR: NEGATIVE

## 2021-08-10 LAB — BRAIN NATRIURETIC PEPTIDE: B Natriuretic Peptide: 150.1 pg/mL — ABNORMAL HIGH (ref 0.0–100.0)

## 2021-08-10 LAB — CBG MONITORING, ED: Glucose-Capillary: 190 mg/dL — ABNORMAL HIGH (ref 70–99)

## 2021-08-10 MED ORDER — ONDANSETRON HCL 4 MG PO TABS: 4.0000 mg | ORAL_TABLET | Freq: Four times a day (QID) | ORAL | Status: DC | PRN

## 2021-08-10 MED ORDER — ENOXAPARIN SODIUM 40 MG/0.4ML IJ SOSY: 40.0000 mg | PREFILLED_SYRINGE | INTRAMUSCULAR | Status: DC

## 2021-08-10 MED ORDER — ONDANSETRON HCL 4 MG/2ML IJ SOLN: 4.0000 mg | Freq: Four times a day (QID) | INTRAMUSCULAR | Status: DC | PRN

## 2021-08-10 MED ORDER — PAROXETINE HCL 20 MG PO TABS
20.0000 mg | ORAL_TABLET | Freq: Every day | ORAL | Status: DC
Start: 2021-08-10 — End: 2021-08-11
  Administered 2021-08-10: 20 mg via ORAL
  Filled 2021-08-10: qty 1

## 2021-08-10 MED ORDER — IPRATROPIUM-ALBUTEROL 0.5-2.5 (3) MG/3ML IN SOLN
3.0000 mL | Freq: Four times a day (QID) | RESPIRATORY_TRACT | Status: DC
  Administered 2021-08-10 – 2021-08-11 (×3): 3 mL via RESPIRATORY_TRACT
  Filled 2021-08-10 (×3): qty 3

## 2021-08-10 MED ORDER — IPRATROPIUM-ALBUTEROL 0.5-2.5 (3) MG/3ML IN SOLN: 3.0000 mL | Freq: Four times a day (QID) | RESPIRATORY_TRACT | Status: DC

## 2021-08-10 MED ORDER — ACETAMINOPHEN 325 MG PO TABS
650.0000 mg | ORAL_TABLET | Freq: Four times a day (QID) | ORAL | Status: DC | PRN
  Administered 2021-08-11: 650 mg via ORAL
  Filled 2021-08-10: qty 2

## 2021-08-10 MED ORDER — TRAZODONE HCL 50 MG PO TABS: 25.0000 mg | ORAL_TABLET | Freq: Every evening | ORAL | Status: DC | PRN

## 2021-08-10 MED ORDER — SODIUM CHLORIDE 0.9 % IV BOLUS (SEPSIS): 1000.0000 mL | Freq: Once | INTRAVENOUS | Status: DC

## 2021-08-10 MED ORDER — HYDROCOD POLI-CHLORPHE POLI ER 10-8 MG/5ML PO SUER: 5.0000 mL | Freq: Two times a day (BID) | ORAL | Status: DC | PRN

## 2021-08-10 MED ORDER — SODIUM CHLORIDE 0.9 % IV BOLUS (SEPSIS): 500.0000 mL | Freq: Once | INTRAVENOUS | Status: DC

## 2021-08-10 MED ORDER — BUPROPION HCL ER (XL) 150 MG PO TB24
450.0000 mg | ORAL_TABLET | Freq: Every day | ORAL | Status: DC
Start: 2021-08-10 — End: 2021-08-11
  Administered 2021-08-10: 450 mg via ORAL
  Filled 2021-08-10: qty 3

## 2021-08-10 MED ORDER — MORPHINE SULFATE (PF) 4 MG/ML IV SOLN
4.0000 mg | Freq: Once | INTRAVENOUS | Status: AC
  Administered 2021-08-10: 4 mg via INTRAVENOUS
  Filled 2021-08-10: qty 1

## 2021-08-10 MED ORDER — DOCUSATE SODIUM 100 MG PO CAPS
100.0000 mg | ORAL_CAPSULE | Freq: Every day | ORAL | Status: DC
  Administered 2021-08-10 – 2021-08-11 (×2): 100 mg via ORAL
  Filled 2021-08-10 (×2): qty 1

## 2021-08-10 MED ORDER — ROSUVASTATIN CALCIUM 20 MG PO TABS
40.0000 mg | ORAL_TABLET | Freq: Every day | ORAL | Status: DC
Start: 2021-08-10 — End: 2021-08-11
  Administered 2021-08-10: 40 mg via ORAL
  Filled 2021-08-10: qty 2

## 2021-08-10 MED ORDER — ONDANSETRON HCL 4 MG/2ML IJ SOLN
4.0000 mg | Freq: Once | INTRAMUSCULAR | Status: AC
Start: 2021-08-10 — End: 2021-08-10
  Administered 2021-08-10: 4 mg via INTRAVENOUS
  Filled 2021-08-10: qty 2

## 2021-08-10 MED ORDER — VITAMIN D 25 MCG (1000 UNIT) PO TABS
1000.0000 [IU] | ORAL_TABLET | Freq: Every day | ORAL | Status: DC
  Administered 2021-08-10 – 2021-08-11 (×2): 1000 [IU] via ORAL
  Filled 2021-08-10 (×2): qty 1

## 2021-08-10 MED ORDER — LEVOTHYROXINE SODIUM 50 MCG PO TABS
75.0000 ug | ORAL_TABLET | Freq: Every day | ORAL | Status: DC
  Administered 2021-08-11: 75 ug via ORAL
  Filled 2021-08-10: qty 1

## 2021-08-10 MED ORDER — FERROUS SULFATE 325 (65 FE) MG PO TABS
325.0000 mg | ORAL_TABLET | Freq: Every day | ORAL | Status: DC
  Administered 2021-08-11: 325 mg via ORAL
  Filled 2021-08-10: qty 1

## 2021-08-10 MED ORDER — IOHEXOL 350 MG/ML SOLN
80.0000 mL | Freq: Once | INTRAVENOUS | Status: AC | PRN
  Administered 2021-08-10: 80 mL via INTRAVENOUS

## 2021-08-10 MED ORDER — PREDNISONE 20 MG PO TABS: 40.0000 mg | ORAL_TABLET | Freq: Every day | ORAL | Status: DC

## 2021-08-10 MED ORDER — INSULIN GLARGINE-YFGN 100 UNIT/ML ~~LOC~~ SOLN
23.0000 [IU] | Freq: Every day | SUBCUTANEOUS | Status: DC
  Administered 2021-08-10: 23 [IU] via SUBCUTANEOUS
  Filled 2021-08-10 (×2): qty 0.23

## 2021-08-10 MED ORDER — INSULIN ASPART 100 UNIT/ML IJ SOLN
0.0000 [IU] | Freq: Three times a day (TID) | INTRAMUSCULAR | Status: DC
  Administered 2021-08-10 (×2): 2 [IU] via SUBCUTANEOUS
  Administered 2021-08-11: 1 [IU] via SUBCUTANEOUS
  Administered 2021-08-11: 2 [IU] via SUBCUTANEOUS
  Filled 2021-08-10 (×5): qty 1

## 2021-08-10 MED ORDER — GUAIFENESIN ER 600 MG PO TB12
600.0000 mg | ORAL_TABLET | Freq: Two times a day (BID) | ORAL | Status: DC
  Administered 2021-08-10 – 2021-08-11 (×3): 600 mg via ORAL
  Filled 2021-08-10 (×3): qty 1

## 2021-08-10 MED ORDER — METHYLPREDNISOLONE SODIUM SUCC 40 MG IJ SOLR: 40.0000 mg | Freq: Two times a day (BID) | INTRAMUSCULAR | Status: DC

## 2021-08-10 MED ORDER — ACETAMINOPHEN 325 MG PO TABS: 650.0000 mg | ORAL_TABLET | Freq: Four times a day (QID) | ORAL | Status: DC | PRN

## 2021-08-10 MED ORDER — ENOXAPARIN SODIUM 40 MG/0.4ML IJ SOSY
40.0000 mg | PREFILLED_SYRINGE | Freq: Every day | INTRAMUSCULAR | Status: DC
  Administered 2021-08-10: 40 mg via SUBCUTANEOUS
  Filled 2021-08-10: qty 0.4

## 2021-08-10 MED ORDER — MAGNESIUM HYDROXIDE 400 MG/5ML PO SUSP: 30.0000 mL | Freq: Every day | ORAL | Status: DC | PRN

## 2021-08-10 MED ORDER — SODIUM CHLORIDE 0.9 % IV SOLN
1.0000 g | Freq: Once | INTRAVENOUS | Status: AC
  Administered 2021-08-10: 1 g via INTRAVENOUS
  Filled 2021-08-10: qty 10

## 2021-08-10 MED ORDER — SODIUM CHLORIDE 0.9 % IV BOLUS (SEPSIS): 250.0000 mL | Freq: Once | INTRAVENOUS | Status: DC

## 2021-08-10 MED ORDER — SODIUM CHLORIDE 0.9 % IV SOLN: 2.0000 g | INTRAVENOUS | Status: DC

## 2021-08-10 MED ORDER — ALUM & MAG HYDROXIDE-SIMETH 200-200-20 MG/5ML PO SUSP
30.0000 mL | Freq: Once | ORAL | Status: AC
  Administered 2021-08-10: 30 mL via ORAL
  Filled 2021-08-10: qty 30

## 2021-08-10 MED ORDER — CLOPIDOGREL BISULFATE 75 MG PO TABS
75.0000 mg | ORAL_TABLET | Freq: Every day | ORAL | Status: DC
  Administered 2021-08-10: 75 mg via ORAL
  Filled 2021-08-10: qty 1

## 2021-08-10 MED ORDER — SODIUM CHLORIDE 0.9 % IV SOLN: INTRAVENOUS | Status: DC

## 2021-08-10 MED ORDER — SODIUM CHLORIDE 0.9 % IV SOLN
500.0000 mg | INTRAVENOUS | Status: DC
  Administered 2021-08-10: 500 mg via INTRAVENOUS
  Filled 2021-08-10: qty 5

## 2021-08-10 MED ORDER — PANTOPRAZOLE SODIUM 40 MG IV SOLR
40.0000 mg | Freq: Once | INTRAVENOUS | Status: AC
  Administered 2021-08-10: 40 mg via INTRAVENOUS
  Filled 2021-08-10: qty 10

## 2021-08-10 MED ORDER — PANTOPRAZOLE SODIUM 40 MG IV SOLR
40.0000 mg | Freq: Two times a day (BID) | INTRAVENOUS | Status: DC
Start: 2021-08-10 — End: 2021-08-11
  Administered 2021-08-10 – 2021-08-11 (×2): 40 mg via INTRAVENOUS
  Filled 2021-08-10 (×2): qty 10

## 2021-08-10 MED ORDER — INSULIN ASPART 100 UNIT/ML IJ SOLN
0.0000 [IU] | Freq: Every day | INTRAMUSCULAR | Status: DC
  Administered 2021-08-10: 3 [IU] via SUBCUTANEOUS
  Filled 2021-08-10: qty 1

## 2021-08-10 MED ORDER — ACETAMINOPHEN 650 MG RE SUPP: 650.0000 mg | Freq: Four times a day (QID) | RECTAL | Status: DC | PRN

## 2021-08-10 MED ORDER — DOXYCYCLINE HYCLATE 100 MG PO TABS
100.0000 mg | ORAL_TABLET | Freq: Once | ORAL | Status: AC
  Administered 2021-08-10: 100 mg via ORAL
  Filled 2021-08-10: qty 1

## 2021-08-10 MED ORDER — SODIUM CHLORIDE 0.9 % IV SOLN: 500.0000 mg | INTRAVENOUS | Status: DC

## 2021-08-10 MED ORDER — GABAPENTIN 100 MG PO CAPS: 100.0000 mg | ORAL_CAPSULE | Freq: Every day | ORAL | Status: DC

## 2021-08-10 MED ORDER — ACETAMINOPHEN 325 MG RE SUPP: 650.0000 mg | Freq: Four times a day (QID) | RECTAL | Status: DC | PRN

## 2021-08-10 NOTE — Assessment & Plan Note (Signed)
-   We will continue statin therapy. 

## 2021-08-10 NOTE — Assessment & Plan Note (Signed)
-  We will continue Wellbutrin XL and Paxil.

## 2021-08-10 NOTE — ED Triage Notes (Signed)
Patient to ED via [facility] from home for CP and headache. Patient states CP was sharp and woke her up out of her sleep around 0100. Hx of HTN and diabetes.

## 2021-08-10 NOTE — Consult Note (Signed)
Abigail Darby, MD [address]  [place]  [place], [place] [address]  Main: [phone]  Fax: [phone] Pager: [phone]   Consultation  Referring Provider:     No ref. provider found Primary Care Physician:  Kirk Ruths, MD Primary Gastroenterologist:    Althia Forts      Reason for Consultation:     Aspiration pneumonia, ?  Difficulty swallowing  Date of Admission:  08/10/2021 Date of Consultation:  08/11/2021         HPI:   Abigail Walker is a 76 y.o. female with history of GERD, type 2 diabetes poorly controlled on insulin, hypothyroidism, hypertension, OSA with acute onset of substernal chest pain felt as burning, preceded by swallowing her medications at once last night and she felt the pill may have gone the wrong way into her lungs.  She was coughing few times and could not get it out.  Patient woke up this morning with substernal chest pain, coughed some clear sputum.  Patient reports that she had history of difficulty swallowing in the past and underwent stretching of her esophagus in 2017.  She does have chronic reflux and not on any PPI.  In the ER, she was hypertensive, slightly tachypneic, mildly elevated BNP and troponin.  She underwent chest x-ray which revealed bilateral chronic interstitial thickening with no evidence of acute lung process.  She underwent CT angio chest abdomen pelvis for aortic dissection protocol which did not reveal any dissection or aneurysm.  She did have scattered groundglass opacities in the right middle lobe concerning for infection or inflammation.  No other acute intra-abdominal pathology identified.  Patient is admitted for treatment of probable aspiration pneumonia and GI is consulted for further evaluation.  Patient is currently on ceftriaxone and azithromycin for treatment of aspiration pneumonia.  Patient is oxygenating 100% on room air. She does not have teeth or dentures   NSAIDs: None  Antiplts/Anticoagulants/Anti  thrombotics: Plavix  GI Procedures:  Upper endoscopy 10/22/2009 Diagnosis:  ANTRUM COLD BIOPSY:  - ANTRAL MUCOSA WITH MILD SUPERFICIAL VASCULAR CONGESTION,  REACTIVE FOVEOLAR HYPERPLASIA, AND FEATURES SUGGESTIVE OF HEALING  MUCOSAL INJURY.  - NEGATIVE FOR H.PYLORI, DYSPLASIA AND MALIGNANCY.   Upper endoscopy 06/08/2015 by Dr. Verdie Shire for dysphagia and chronic GERD - LA Grade A reflux esophagitis. Dilated. Biopsied. 40 [place] DIAGNOSIS:  A. GEJ; COLD BIOPSY:  - SQUAMOCOLUMNAR MUCOSA WITH CHANGES CONSISTENT WITH REFLUX.  - NEGATIVE FOR DYSPLASIA AND MALIGNANCY.   Past Medical History:  Diagnosis Date   Acid reflux 06/16/2013   Adult hypothyroidism 06/16/2013   Last Assessment & Plan:  Tsh and energy have been stable.     Benign hypertension 06/16/2013   Last Assessment & Plan:  Patients blood pressure has seemingly been controlled without significant side effects such as dizziness or slow heart rate.      Cancer ([place]) skin   Sherran Needs syndrome    Chronic kidney disease    Depression    Diabetes mellitus without complication (HCC)    type 2   GERD (gastroesophageal reflux disease)    HBV (hepatitis B virus) infection    High blood pressure    Hives    food related   Hypothyroidism    LBBB (left bundle branch block)    Lichen planus    Mixed incontinence    Obstructive apnea 06/16/2013   Sleep apnea    Thyroid disease     Past Surgical History:  Procedure Laterality Date  CATARACT EXTRACTION Bilateral    COLONOSCOPY  2009   ESOPHAGOGASTRODUODENOSCOPY     2009, 2011   ESOPHAGOGASTRODUODENOSCOPY (EGD) WITH PROPOFOL N/A 06/08/2015   Procedure: ESOPHAGOGASTRODUODENOSCOPY (EGD) WITH PROPOFOL;  Surgeon: Hulen Luster, MD;  Location: [facility] ENDOSCOPY;  Service: Gastroenterology;  Laterality: N/A;   HEMORRHOID SURGERY     IMPLANTABLE CONTACT LENS IMPLANTATION     TONSILLECTOMY     TUBAL LIGATION       Current Facility-Administered Medications:    acetaminophen (TYLENOL)  tablet 650 mg, 650 mg, Oral, Q6H PRN **OR** acetaminophen (TYLENOL) suppository 650 mg, 650 mg, Rectal, Q6H PRN, Mansy, Jan A, MD   Ampicillin-Sulbactam (UNASYN) 3 g in sodium chloride 0.9 % 100 mL IVPB, 3 g, Intravenous, Q12H, Darrick Penna, RPH   azithromycin [facility]) 500 mg in sodium chloride 0.9 % 250 mL IVPB, 500 mg, Intravenous, Q24H, Mansy, Jan A, MD, Stopped at 08/11/21 0231   buPROPion (WELLBUTRIN XL) 24 hr tablet 450 mg, 450 mg, Oral, QHS, Mansy, Jan A, MD, 450 mg at 08/10/21 2222   chlorpheniramine-HYDROcodone 10-8 MG/5ML suspension 5 mL, 5 mL, Oral, Q12H PRN, Mansy, Jan A, MD   cholecalciferol (VITAMIN D3) tablet 1,000 Units, 1,000 Units, Oral, Daily, Mansy, Jan A, MD, 1,000 Units at 08/11/21 0846   docusate sodium (COLACE) capsule 100 mg, 100 mg, Oral, Daily, Mansy, Jan A, MD, 100 mg at 08/11/21 0846   enoxaparin (LOVENOX) injection 40 mg, 40 mg, Subcutaneous, QHS, Mansy, Jan A, MD, 40 mg at 08/10/21 2223   ferrous sulfate tablet 325 mg, 325 mg, Oral, Q breakfast, Mansy, Jan A, MD, 325 mg at 08/11/21 0847   guaiFENesin (MUCINEX) 12 hr tablet 600 mg, 600 mg, Oral, BID, Mansy, Jan A, MD, 600 mg at 08/11/21 0846   insulin aspart (novoLOG) injection 0-5 Units, 0-5 Units, Subcutaneous, QHS, Mansy, Jan A, MD, 3 Units at 08/10/21 2247   insulin aspart (novoLOG) injection 0-9 Units, 0-9 Units, Subcutaneous, TID WC, Mansy, Jan A, MD, 1 Units at 08/11/21 0847   insulin glargine-yfgn [facility]) injection 23 Units, 23 Units, Subcutaneous, QHS, Mansy, Jan A, MD, 23 Units at 08/10/21 2223   ipratropium-albuterol (DUONEB) 0.5-2.5 (3) MG/3ML nebulizer solution 3 mL, 3 mL, Nebulization, QID, Mansy, Jan A, MD, 3 mL at 08/11/21 0846   levothyroxine (SYNTHROID) tablet 75 mcg, 75 mcg, Oral, Q0600, Mansy, Jan A, MD, 75 mcg at 08/11/21 0533   magnesium hydroxide (MILK OF MAGNESIA) suspension 30 mL, 30 mL, Oral, Daily PRN, Mansy, Jan A, MD   ondansetron (ZOFRAN) tablet 4 mg, 4 mg, Oral, Q6H PRN **OR**  ondansetron (ZOFRAN) injection 4 mg, 4 mg, Intravenous, Q6H PRN, Mansy, Jan A, MD   pantoprazole (PROTONIX) injection 40 mg, 40 mg, Intravenous, Q12H, Sophiamarie Nease, Tally Due, MD, 40 mg at 08/11/21 0846   PARoxetine (PAXIL) tablet 20 mg, 20 mg, Oral, QHS, Mansy, Jan A, MD, 20 mg at 08/10/21 2221   rosuvastatin (CRESTOR) tablet 40 mg, 40 mg, Oral, QHS, Mansy, Jan A, MD, 40 mg at 08/10/21 2221   traZODone (DESYREL) tablet 25 mg, 25 mg, Oral, QHS PRN, Mansy, Arvella Merles, MD  Current Outpatient Medications:    acetaminophen (TYLENOL) 500 MG tablet, Take 500 mg by mouth every 6 (six) hours as needed for moderate pain., Disp: , Rfl:    buPROPion (WELLBUTRIN XL) 150 MG 24 hr tablet, Take 450 mg by mouth at bedtime., Disp: , Rfl:    cholecalciferol (VITAMIN D) 25 MCG (1000 UNIT) tablet, Take 1,000 Units by mouth  daily., Disp: , Rfl:    clobetasol ointment (TEMOVATE) 0.35 %, Apply 1 application topically daily. vagina, Disp: 60 g, Rfl: 1   clopidogrel (PLAVIX) 75 MG tablet, Take 75 mg by mouth at bedtime., Disp: , Rfl:    docusate sodium (COLACE) 100 MG capsule, Take 100 mg by mouth daily., Disp: , Rfl:    ferrous sulfate 325 (65 FE) MG tablet, Take 325 mg by mouth daily with breakfast., Disp: , Rfl:    insulin glargine (LANTUS) 100 UNIT/ML injection, Inject 15 Units into the skin at bedtime., Disp: , Rfl:    levothyroxine (SYNTHROID, LEVOTHROID) 75 MCG tablet, Take 75 mcg by mouth daily before breakfast., Disp: , Rfl:    NOVOLOG 100 UNIT/ML injection, Inject 6 Units into the skin in the morning and at bedtime., Disp: , Rfl:    nystatin cream (MYCOSTATIN), Apply 1 application topically as needed (yeast)., Disp: , Rfl:    PARoxetine (PAXIL) 20 MG tablet, Take 20 mg by mouth at bedtime., Disp: , Rfl:    rosuvastatin (CRESTOR) 40 MG tablet, Take 40 mg by mouth at bedtime., Disp: , Rfl:    torsemide (DEMADEX) 10 MG tablet, Take 10 mg by mouth every other day., Disp: , Rfl:    gabapentin (NEURONTIN) 100 MG capsule,  Take by mouth. (Patient not taking: Reported on 08/10/2021), Disp: , Rfl:    Insulin Syringe-Needle U-100 (INSULIN SYRINGE 1CC/31GX5/16") 31G X 5/16" 1 ML MISC, USE ONE SYRINGE TWICE DAILY AS DIRECTED WITH INSULIN VIALS, Disp: , Rfl:    Family History  Problem Relation Age of Onset   Heart disease Father    Cancer Mother    Kidney failure Maternal Aunt      Social History   Tobacco Use   Smoking status: Former    Types: Cigarettes    Quit date: 07/27/1993    Years since quitting: 28.0   Smokeless tobacco: Never   Tobacco comments:    quit 20 years  Vaping Use   Vaping Use: Never used  Substance Use Topics   Alcohol use: No    Alcohol/week: 0.0 standard drinks of alcohol   Drug use: No    Allergies as of 08/10/2021 - Review Complete 08/10/2021  Allergen Reaction Noted   Metformin and related Nausea And Vomiting 07/06/2020   Sulfa antibiotics Nausea And Vomiting 07/28/2014   Hydrocortisone-iodoquinol  [hydrocortisone-iodoquinol] Rash 03/16/2017    Review of Systems:    All systems reviewed and negative except where noted in HPI.   Physical Exam:  Vital signs in last 24 hours: Temp:  [98.3 F (36.8 C)-99.7 F (37.6 C)] 98.5 F (36.9 C) (07/26 0813) Pulse Rate:  [84-94] 84 (07/26 0800) Resp:  [14-20] 19 (07/26 0800) BP: (123-201)/(48-145) 128/48 (07/26 0800) SpO2:  [93 %-99 %] 95 % (07/26 0800) Weight:  [55.3 kg] 55.3 kg (07/25 0958)   General:   Pleasant, cooperative in NAD Head:  Normocephalic and atraumatic. Eyes:   No icterus.   Conjunctiva pink. PERRLA. Ears:  Normal auditory acuity. Neck:  Supple; no masses or thyroidomegaly Lungs: Respirations even and unlabored. Lungs clear to auscultation bilaterally.   No wheezes, crackles, or rhonchi.  Heart:  Regular rate and rhythm;  Without murmur, clicks, rubs or gallops Abdomen:  Soft, nondistended, nontender. Normal bowel sounds. No appreciable masses or hepatomegaly.  No rebound or guarding.  Rectal:  Not  performed. Msk:  Symmetrical without gross deformities.  Strength normal Extremities:  Without edema, cyanosis or clubbing. Neurologic:  Alert and  oriented x3;  grossly normal neurologically. Skin:  Intact without significant lesions or rashes. Cervical Nodes:  No significant cervical adenopathy. Psych:  Alert and cooperative. Normal affect.  LAB RESULTS:    Latest Ref Rng & Units 08/11/2021    3:39 AM 08/10/2021   10:01 AM 01/26/2021   12:12 PM  CBC  WBC 4.0 - 10.5 K/uL 4.8  6.0  3.5   Hemoglobin 12.0 - 15.0 g/dL 11.2  13.4  13.3   Hematocrit 36.0 - 46.0 % 34.9  41.1  40.1   Platelets 150 - 400 K/uL 101  143  140     BMET    Latest Ref Rng & Units 08/11/2021    3:39 AM 08/10/2021   10:01 AM 01/26/2021   12:12 PM  BMP  Glucose 70 - 99 mg/dL 98  220  247   BUN 8 - 23 mg/dL 25  24  18    Creatinine 0.44 - 1.00 mg/dL 1.40  1.20  1.12   Sodium 135 - 145 mmol/L 139  139  136   Potassium 3.5 - 5.1 mmol/L 4.1  3.9  4.9   Chloride 98 - 111 mmol/L 109  106  102   CO2 22 - 32 mmol/L 26  24  29    Calcium 8.9 - 10.3 mg/dL 8.8  9.5  9.3     LFT    Latest Ref Rng & Units 08/10/2021   10:01 AM 01/09/2020    8:55 PM 12/05/2019   10:31 PM  Hepatic Function  Total Protein 6.5 - 8.1 g/dL 6.3  7.7  7.0   Albumin 3.5 - 5.0 g/dL 3.7  4.1  3.6   AST 15 - 41 U/L 28  39  20   ALT 0 - 44 U/L 18  18  15    Alk Phosphatase 38 - 126 U/L 73  80  104   Total Bilirubin 0.3 - 1.2 mg/dL 0.9  1.1  0.9      STUDIES: CT Angio Chest/Abd/Pel for Dissection W and/or Wo Contrast  Result Date: 08/10/2021 CLINICAL DATA:  Chest pain EXAM: CT ANGIOGRAPHY CHEST, ABDOMEN AND PELVIS TECHNIQUE: Non-contrast CT of the chest was initially obtained. Multidetector CT imaging through the chest, abdomen and pelvis was performed using the standard protocol during bolus administration of intravenous contrast. Multiplanar reconstructed images and MIPs were obtained and reviewed to evaluate the vascular anatomy. RADIATION DOSE  REDUCTION: This exam was performed according to the departmental dose-optimization program which includes automated exposure control, adjustment of the mA and/or kV according to patient size and/or use of iterative reconstruction technique. CONTRAST:  68m OMNIPAQUE IOHEXOL 350 MG/ML SOLN COMPARISON:  Same day chest radiograph, CT abdomen/pelvis 04/16/2007, thoracic spine and lumbar spine CT 12/05/2019 FINDINGS: CTA CHEST FINDINGS Cardiovascular: There is no evidence of acute intramural hematoma on the initial noncontrast study. There is no evidence of dissection or aneurysm on the postcontrast study. There is no evidence of pulmonary embolism. The heart size is normal. There is no pericardial effusion. There are coronary artery calcifications, aortic valve calcifications, and calcified atherosclerotic plaque in the thoracic aorta. Mediastinum/Nodes: Esophagus is grossly unremarkable. There is no mediastinal, hilar, or axillary lymphadenopathy. Lungs/Pleura: The trachea and central airways are patent. Are numerous small foci of ground-glass opacity in the right middle lobe and a single focus of ground-glass opacity in the right lower lobe. There is a 5 mm solid nodule in the lateral segment of the right middle lobe (6-90). There is a 4 mm  solid nodule in the left lower lobe (6-102). This nodule was present in 2009. There is a 3-4 mm nodule more inferolaterally in the left base, also present in 2009 (6-109). These nodules can be considered benign. Musculoskeletal: There is no acute osseous abnormality or suspicious osseous lesion. There is indentation of the inferior T12 endplate by a prominent Schmorl's node, similar to the prior thoracic spine CT from 2021. Other thoracic vertebral body heights are preserved. Review of the MIP images confirms the above findings. CTA ABDOMEN AND PELVIS FINDINGS VASCULAR Aorta: Normal caliber aorta without aneurysm, dissection, or vasculitis. There is circumferential calcified  atherosclerotic plaque without significant stenosis. Celiac: Patent without evidence of aneurysm, dissection, vasculitis or significant stenosis. SMA: Patent without evidence of aneurysm, dissection, or vasculitis. There is calcified plaque at the origin resulting in mild stenosis. Renals: Both renal arteries are patent without evidence of aneurysm, dissection, vasculitis, or fibromuscular dysplasia. There is calcified plaque at the origins resulting in at least moderate stenosis on the left and mild stenosis on the right. IMA: Patent without evidence of aneurysm, dissection, vasculitis or significant stenosis. Inflow: Patent without evidence of aneurysm, dissection, vasculitis or significant stenosis. Scattered calcified plaque. Veins: No obvious venous abnormality within the limitations of this arterial phase study. Review of the MIP images confirms the above findings. NON-VASCULAR Hepatobiliary: The liver and gallbladder are unremarkable. There is no biliary ductal dilatation. Pancreas: Unremarkable. Spleen: Unremarkable. Adrenals/Urinary Tract: Adrenals are unremarkable. The kidneys are unremarkable, with no focal lesion, stone, hydronephrosis, or hydroureter. The bladder is unremarkable. Stomach/Bowel: Stomach is unremarkable there is no evidence of bowel obstruction. There is no abnormal bowel wall thickening or inflammatory change. There are scattered colonic diverticuli without evidence of acute diverticulitis. Lymphatic: There is no abdominopelvic lymphadenopathy. Reproductive: The uterus and adnexa are unremarkable. Other: There is no ascites or free air. Musculoskeletal: Compression deformity of the L1 vertebral body is similar to the CT from 2021. There is indentation of the inferior L2 endplate by a prominent Schmorl's node, also unchanged. There is no acute osseous abnormality or suspicious osseous lesion. Review of the MIP images confirms the above findings. IMPRESSION: 1. No evidence of aortic  dissection or aneurysm. 2. Scattered ground-glass opacities in the right middle lobe are favored infectious or inflammatory in etiology. Recommend follow-up chest CT in 3 months to ensure resolution. The 5 mm solid nodule in the right middle lobe can also be reassessed at this time. 3. No acute pathology in the abdomen or pelvis. 4. Scattered colonic diverticuli without evidence of acute diverticulitis. 5.  Aortic Atherosclerosis (ICD10-I70.0). Electronically Signed   By: Valetta Mole M.D.   On: 08/10/2021 11:57   DG Chest 2 View  Result Date: 08/10/2021 CLINICAL DATA:  Shortness of breath.  Chest pain. EXAM: CHEST - 2 VIEW COMPARISON:  AP chest 01/09/2020 FINDINGS: Cardiac silhouette and mediastinal contours within limits. Moderate calcification within the aortic arch. Mild chronic bilateral interstitial thickening is similar to prior. No focal airspace opacity. No pulmonary edema, pleural effusion, or pneumothorax. Mild-to-moderate multilevel degenerative disc changes of the thoracic spine. IMPRESSION: Chronic bilateral interstitial thickening.  No acute lung process. Electronically Signed   By: Yvonne Kendall M.D.   On: 08/10/2021 11:00   CT HEAD WO CONTRAST (5MM)  Result Date: 08/10/2021 CLINICAL DATA:  Headache EXAM: CT HEAD WITHOUT CONTRAST TECHNIQUE: Contiguous axial images were obtained from the base of the skull through the vertex without intravenous contrast. RADIATION DOSE REDUCTION: This exam was performed according to the  departmental dose-optimization program which includes automated exposure control, adjustment of the mA and/or kV according to patient size and/or use of iterative reconstruction technique. COMPARISON:  Chest CT dated January 09, 2020 FINDINGS: Brain: Chronic white matter ischemic change. No evidence of acute infarction, hemorrhage, hydrocephalus, extra-axial collection or mass lesion/mass effect. Vascular: No hyperdense vessel or unexpected calcification. Skull: Normal.  Negative for fracture or focal lesion. Sinuses/Orbits: No acute finding. Other: None. IMPRESSION: No acute intracranial abnormality. Electronically Signed   By: Yetta Glassman M.D.   On: 08/10/2021 10:42      Impression / Plan:   Abigail Walker is a 76 y.o. female with metabolic syndrome, poorly controlled diabetes, history of chronic GERD and erosive esophagitis presented with aspiration pneumonia and GI is consulted for possible dysphagia  Patient is currently being treated for aspiration pneumonia. Recommend speech path evaluation Patient did not have esophageal stricture based on the EGD in 2017, she was only empirically dilated at that time Recommend soft diet only as pt does not have dentures Patient is on plavix and held since admission, she needs to be off plavix atleast for 3 days to proceed with EGD Start Protonix 40 mg IV twice daily, continue long-term Patient can be reassessed closer to discharge or as outpatient  Thank you for involving me in the care of this patient.      LOS: 1 day   Sherri Sear, MD  08/11/2021, 9:54 AM    Note: This dictation was prepared with Dragon dictation along with smaller phrase technology. Any transcriptional errors that result from this process are unintentional.

## 2021-08-10 NOTE — Assessment & Plan Note (Signed)
-   This is highly suspicious for aspiration pneumonia. - The patient will be admitted to a cardiac telemetry bed. - We will continue antibiotic therapy with IV Rocephin and Zithromax. - Mucolytic therapy and bronchodilator therapy will be provided. - We will follow blood cultures.

## 2021-08-10 NOTE — Assessment & Plan Note (Addendum)
-   This could be related to esophageal stricture or diverticulum. - GI consult to be obtained as she may need EGD and repeat stretching. - I notified Dr. Marius Ditch about the patient

## 2021-08-10 NOTE — Assessment & Plan Note (Signed)
-   We will continue Synthroid. 

## 2021-08-10 NOTE — Assessment & Plan Note (Signed)
-   We will continue PPI therapy 

## 2021-08-10 NOTE — Assessment & Plan Note (Signed)
-   The patient will be placed on supplemental coverage with NovoLog. - We will continue basal coverage. - We will continue Neurontin- The patient will be placed on a t

## 2021-08-10 NOTE — ED Provider Notes (Signed)
[facility] Provider Note    Event Date/Time   First MD Initiated Contact with Patient 08/10/21 0957     (approximate)   History   Chest Pain   HPI  Abigail Walker is a 76 y.o. female   with diabetes, CKD, hypertension , prior stroke on Plavix who comes in with concerns for chest pain.  Patient is on torsemide, lisinopril.  Her blood pressure was 124/67 when checked on 5/10.  Patient comes in with chest pain.  Patient reports that she was trying to swallow one of her pills and felt like it got stuck in her throat and that she was trying to cough a lot to try to get it out but she was unable to.  She then went to bed and she woke up with more congestion at 1 AM, coughing, chest pain.  She reports she coughs so much that there is a little bit of blood tinge noted in it.  This quickly resolved and there was a lot of green sputum.  She does have a little bit of swelling in her legs, denies any abdominal pain.  Reports she takes her blood pressure medications at nighttime and is not missed any doses.  She does report having some headache over the past few days as well.  She denies any falls, hitting her head.      Physical Exam   Triage Vital Signs: ED Triage Vitals  Enc Vitals Group     BP 08/10/21 1001 (!) 201/145     Pulse Rate 08/10/21 1001 94     Resp 08/10/21 1001 18     Temp 08/10/21 1001 98.6 F (37 C)     Temp Source 08/10/21 1001 Oral     SpO2 08/10/21 0956 96 %     Weight 08/10/21 0958 122 lb (55.3 kg)     Height 08/10/21 0958 '5\' 1"'$  (1.549 m)     Head Circumference --      Peak Flow --      Pain Score 08/10/21 0957 4     Pain Loc --      Pain Edu? --      Excl. in [place]? --     Most recent vital signs: Vitals:   08/10/21 0956 08/10/21 1001  BP:  (!) 201/145  Pulse:  94  Resp:  18  Temp:  98.6 F (37 C)  SpO2: 96% 98%     General: Awake, no distress.  CV:  Good peripheral perfusion.  Resp:  Normal effort.  Abd:  No distention.  Soft  and nontender Other:  Trace edema noted in bilateral legs   ED Results / Procedures / Treatments   Labs (all labs ordered are listed, but only abnormal results are displayed) Labs Reviewed  SARS CORONAVIRUS 2 BY RT PCR  CBC WITH DIFFERENTIAL/PLATELET  COMPREHENSIVE METABOLIC PANEL  LIPASE, BLOOD  BRAIN NATRIURETIC PEPTIDE  URINALYSIS, ROUTINE W REFLEX MICROSCOPIC  TROPONIN I (HIGH SENSITIVITY)     EKG  My interpretation of EKG:  Normal sinus rhythm 93 without any ST elevation or T wave inversions, normal intervals  RADIOLOGY I have reviewed the CT head personally interpreted no evidence of intracranial hemorrhage   PROCEDURES:  Critical Care performed: No  .1-3 Lead EKG Interpretation  Performed by: Vanessa [place], MD Authorized by: Vanessa [place], MD     Interpretation: normal     ECG rate:  80   ECG rate assessment: normal  Rhythm: sinus rhythm     Ectopy: none     Conduction: normal      MEDICATIONS ORDERED IN ED: Medications  alum & mag hydroxide-simeth (MAALOX/MYLANTA) 200-200-20 MG/5ML suspension 30 mL (30 mLs Oral Given 08/10/21 1024)  pantoprazole (PROTONIX) injection 40 mg (40 mg Intravenous Given 08/10/21 1024)  morphine (PF) 4 MG/ML injection 4 mg (4 mg Intravenous Given 08/10/21 1024)  ondansetron (ZOFRAN) injection 4 mg (4 mg Intravenous Given 08/10/21 1024)     IMPRESSION / MDM / ASSESSMENT AND PLAN / ED COURSE  I reviewed the triage vital signs and the nursing notes.   Patient's presentation is most consistent with acute presentation with potential threat to life or bodily function.   Differential includes ACS, pneumonia, acid reflux, dissection, intracranial hemorrhage.  Patient given some IV morphine Protonix GI cocktail, Zofran to help with symptoms  CT head negative.  Labs are reassuring with normal CBC, CMP, troponin slightly elevated could be demand from the hypertension.  Blood pressures have come down to 160/86.  We will remove the  Nitropaste.  Patient still having pain in her epigastric area going down into her upper abdomen given this with a significant hypertension will get CT dissection to rule out that.  IMPRESSION: 1. No evidence of aortic dissection or aneurysm. 2. Scattered ground-glass opacities in the right middle lobe are favored infectious or inflammatory in etiology. Recommend follow-up chest CT in 3 months to ensure resolution. The 5 mm solid nodule in the right middle lobe can also be reassessed at this time. 3. No acute pathology in the abdomen or pelvis. 4. Scattered colonic diverticuli without evidence of acute diverticulitis. 5.  Aortic Atherosclerosis (ICD10-I70.0).    Patient is report a lot of coughing up mucus and I suspect this could be an aspiration will cover for possible pneumonia.  Given the elevated troponin with no priors this could be demand from the extreme hypertension but given patient's age we will discuss the hospital team for admission for pneumonia, cardiac evaluation I did discuss with patient that she needs follow-up for repeat CT scan in 3 months to ensure resolution and she expressed understanding  The patient is on the cardiac monitor to evaluate for evidence of arrhythmia and/or significant heart rate changes.      FINAL CLINICAL IMPRESSION(S) / ED DIAGNOSES   Final diagnoses:  Community acquired pneumonia of right lung, unspecified part of lung  Chest pain, unspecified type     Rx / DC Orders   ED Discharge Orders     None        Note:  This document was prepared using Dragon voice recognition software and may include unintentional dictation errors.   Vanessa [place], MD 08/10/21 1225

## 2021-08-10 NOTE — Assessment & Plan Note (Signed)
-   She has associated slightly elevated troponin I. - We will follow serial troponins. - This likely atypical and is related to her dysphagia. - He will be placed on as needed IV morphine sulfate and sublingual nitroglycerin for now.

## 2021-08-10 NOTE — H&P (Signed)
[place]   PATIENT NAME: Abigail Walker    MR#:  [id number]  DATE OF BIRTH:  04/29/1945  DATE OF ADMISSION:  08/10/2021  PRIMARY CARE PHYSICIAN: Kirk Ruths, MD   Patient is coming from: Home  REQUESTING/REFERRING PHYSICIAN: Marjean Donna, MD  CHIEF COMPLAINT:   Chief Complaint  Patient presents with   Chest Pain    HISTORY OF PRESENT ILLNESS:  Abigail Walker is a 76 y.o. Caucasian female with medical history significant for GERD, type 2 diabetes mellitus, left bundle branch block, hypothyroidism, lichen planus, OSA, hypertension and hypothyroidism, who presented to the ER with acute onset of substernal chest pain felt as burning and preceded by swallowing all of her medications 1 time last night when she felt a pill went the wrong way down to her lungs.  She kept coughing a few times and could not get it out.  She coughed too hard after that and when she woke up this morning she experienced substernal chest pain and was coughing some sputum that is mainly clear.  She admitted to associated headache.  No nausea or vomiting or diaphoresis.  No radiation to her pain.  She admits to mild dysuria that she relates to her lichen planus without urinary frequency or urgency.  No rhinorrhea or nasal congestion or sore throat.  She previously had dysphagia requiring esophageal stretching and admits to history of reflux as well.  ED Course: When she came to the ER BP was 201/145 with heart rate of 93 and otherwise normal vital signs.  Later respiratory it was 23 and BP was down to 142/69.  Labs revealed CMP with blood glucose of 220 a BUN of 24 and creatinine of 1.2, total protein of 6.3 with unremarkable CMP.  BNP was 150 and high-sensitivity troponin was 40.  CBC was within normal except for mild thrombocytopenia of 143 close to previous levels.  COVID-19 second back negative.  UA showed 6-10 WBCs and rare bacteria with negative nitrite I and small leukocytes with 6-10 RBCs.  EKG as  reviewed by me : EKG showed sinus rhythm with rate of 93. Imaging: Two-view chest x-ray showed bilateral chronic interstitial thickening with no acute lung process.Chest CTA revealed the following: 1. No evidence of aortic dissection or aneurysm. 2. Scattered ground-glass opacities in the right middle lobe are favored infectious or inflammatory in etiology. Recommend follow-up chest CT in 3 months to ensure resolution. The 5 mm solid nodule in the right middle lobe can also be reassessed at this time. 3. No acute pathology in the abdomen or pelvis. 4. Scattered colonic diverticuli without evidence of acute diverticulitis. 5.  Aortic Atherosclerosis.  The patient was given IV Rocephin and p.o. doxycycline as well as 4 mg of IV morphine sulfate, 4 mg of IV Zofran and 40 mg of IV Protonix.  She will be admitted to a cardiac telemetry bed for further evaluation and management. PAST MEDICAL HISTORY:   Past Medical History:  Diagnosis Date   Acid reflux 06/16/2013   Adult hypothyroidism 06/16/2013   Last Assessment & Plan:  Tsh and energy have been stable.     Benign hypertension 06/16/2013   Last Assessment & Plan:  Patients blood pressure has seemingly been controlled without significant side effects such as dizziness or slow heart rate.      Cancer ([place]) skin   Sherran Needs syndrome    Chronic kidney disease    Depression    Diabetes mellitus without complication (  [place])    type 2   GERD (gastroesophageal reflux disease)    HBV (hepatitis B virus) infection    High blood pressure    Hives    food related   Hypothyroidism    LBBB (left bundle branch block)    Lichen planus    Mixed incontinence    Obstructive apnea 06/16/2013   Sleep apnea    Thyroid disease     PAST SURGICAL HISTORY:   Past Surgical History:  Procedure Laterality Date   CATARACT EXTRACTION Bilateral    COLONOSCOPY  2009   ESOPHAGOGASTRODUODENOSCOPY     2009, 2011   ESOPHAGOGASTRODUODENOSCOPY (EGD) WITH  PROPOFOL N/A 06/08/2015   Procedure: ESOPHAGOGASTRODUODENOSCOPY (EGD) WITH PROPOFOL;  Surgeon: Hulen Luster, MD;  Location: [facility] ENDOSCOPY;  Service: Gastroenterology;  Laterality: N/A;   HEMORRHOID SURGERY     IMPLANTABLE CONTACT LENS IMPLANTATION     TONSILLECTOMY     TUBAL LIGATION      SOCIAL HISTORY:   Social History   Tobacco Use   Smoking status: Former    Types: Cigarettes    Quit date: 07/27/1993    Years since quitting: 28.0   Smokeless tobacco: Never   Tobacco comments:    quit 20 years  Substance Use Topics   Alcohol use: No    Alcohol/week: 0.0 standard drinks of alcohol    FAMILY HISTORY:   Family History  Problem Relation Age of Onset   Heart disease Father    Cancer Mother    Kidney failure Maternal Aunt     DRUG ALLERGIES:   Allergies  Allergen Reactions   Metformin And Related Nausea And Vomiting   Sulfa Antibiotics Nausea And Vomiting   Hydrocortisone-Iodoquinol  [Hydrocortisone-Iodoquinol] Rash    Patient reports that she does not believe that she has allergy to this, that she was receiving many medications at once.    REVIEW OF SYSTEMS:   ROS As per history of present illness. All pertinent systems were reviewed above. Constitutional, HEENT, cardiovascular, respiratory, GI, GU, musculoskeletal, neuro, psychiatric, endocrine, integumentary and hematologic systems were reviewed and are otherwise negative/unremarkable except for positive findings mentioned above in the HPI.   MEDICATIONS AT HOME:   Prior to Admission medications   Medication Sig Start Date End Date Taking? Authorizing Provider  acetaminophen (TYLENOL) 500 MG tablet Take 500 mg by mouth every 6 (six) hours as needed for moderate pain.    [provider]  buPROPion (WELLBUTRIN XL) 150 MG 24 hr tablet Take 450 mg by mouth at bedtime.    [provider]  cholecalciferol (VITAMIN D) 25 MCG (1000 UNIT) tablet Take 1,000 Units by mouth daily.    [provider]   clobetasol ointment (TEMOVATE) 6.37 % Apply 1 application topically daily. vagina 02/10/21   Brendolyn Patty, MD  clopidogrel (PLAVIX) 75 MG tablet Take 75 mg by mouth daily. 01/03/20   [provider]  docusate sodium (COLACE) 100 MG capsule Take 100 mg by mouth daily.    [provider]  ferrous sulfate 325 (65 FE) MG tablet Take 325 mg by mouth daily with breakfast.    [provider]  gabapentin (NEURONTIN) 100 MG capsule Take by mouth. 07/05/21   [provider]  insulin glargine (LANTUS) 100 UNIT/ML injection Inject 23 Units into the skin at bedtime. 01/22/20   [provider]  Insulin Syringe-Needle U-100 (INSULIN SYRINGE 1CC/31GX5/16") 31G X 5/16" 1 ML MISC USE ONE SYRINGE TWICE DAILY AS DIRECTED WITH INSULIN VIALS  05/26/14   [provider]  levothyroxine (SYNTHROID, LEVOTHROID) 75 MCG tablet Take 75 mcg by mouth daily before breakfast. 05/21/14   [provider]  NOVOLOG 100 UNIT/ML injection Inject into the skin. 06/15/21   [provider]  nystatin cream (MYCOSTATIN) Apply 1 application topically as needed (yeast). 01/08/14   [provider]  PARoxetine (PAXIL) 20 MG tablet Take 20 mg by mouth at bedtime.    [provider]  rosuvastatin (CRESTOR) 40 MG tablet Take 40 mg by mouth at bedtime. 01/03/20   [provider]  torsemide (DEMADEX) 10 MG tablet Take 10 mg by mouth every other day. 04/03/19   [provider]      VITAL SIGNS:  Blood pressure (!) 142/69, pulse 92, temperature 98.4 F (36.9 C), temperature source Oral, resp. rate 18, height '5\' 1"'$  (1.549 m), weight 55.3 kg, SpO2 97 %.  PHYSICAL EXAMINATION:  Physical Exam  GENERAL:  76 y.o.-year-old Caucasian female patient lying in the bed with no acute distress.  EYES: Pupils equal, round, reactive to light and accommodation. No scleral icterus. Extraocular muscles intact.  HEENT: Head atraumatic, normocephalic. Oropharynx and  nasopharynx clear.  NECK:  Supple, no jugular venous distention. No thyroid enlargement, no tenderness.  LUNGS: Diminished right basal and mid lung zone breath sounds with associated crackles.. No use of accessory muscles of respiration.  CARDIOVASCULAR: Regular rate and rhythm, S1, S2 normal. No murmurs, rubs, or gallops.  ABDOMEN: Soft, nondistended, nontender. Bowel sounds present. No organomegaly or mass.  EXTREMITIES: No pedal edema, cyanosis, or clubbing.  NEUROLOGIC: Cranial nerves II through XII are intact. Muscle strength 5/5 in all extremities. Sensation intact. Gait not checked.  PSYCHIATRIC: The patient is alert and oriented x 3.  Normal affect and good eye contact. SKIN: No obvious rash, lesion, or ulcer.   LABORATORY PANEL:   CBC Recent Labs  Lab 08/10/21 1001  WBC 6.0  HGB 13.4  HCT 41.1  PLT 143*   ------------------------------------------------------------------------------------------------------------------  Chemistries  Recent Labs  Lab 08/10/21 1001  NA 139  K 3.9  CL 106  CO2 24  GLUCOSE 220*  BUN 24*  CREATININE 1.20*  CALCIUM 9.5  AST 28  ALT 18  ALKPHOS 73  BILITOT 0.9   ------------------------------------------------------------------------------------------------------------------  Cardiac Enzymes No results for input(s): "TROPONINI" in the last 168 hours. ------------------------------------------------------------------------------------------------------------------  RADIOLOGY:  CT Angio Chest/Abd/Pel for Dissection W and/or Wo Contrast  Result Date: 08/10/2021 CLINICAL DATA:  Chest pain EXAM: CT ANGIOGRAPHY CHEST, ABDOMEN AND PELVIS TECHNIQUE: Non-contrast CT of the chest was initially obtained. Multidetector CT imaging through the chest, abdomen and pelvis was performed using the standard protocol during bolus administration of intravenous contrast. Multiplanar reconstructed images and MIPs were obtained and reviewed to evaluate the  vascular anatomy. RADIATION DOSE REDUCTION: This exam was performed according to the departmental dose-optimization program which includes automated exposure control, adjustment of the mA and/or kV according to patient size and/or use of iterative reconstruction technique. CONTRAST:  55m OMNIPAQUE IOHEXOL 350 MG/ML SOLN COMPARISON:  Same day chest radiograph, CT abdomen/pelvis 04/16/2007, thoracic spine and lumbar spine CT 12/05/2019 FINDINGS: CTA CHEST FINDINGS Cardiovascular: There is no evidence of acute intramural hematoma on the initial noncontrast study. There is no evidence of dissection or aneurysm on the postcontrast study. There is no evidence of pulmonary embolism. The heart size is normal. There is no pericardial effusion. There are coronary artery calcifications, aortic valve calcifications, and calcified atherosclerotic plaque in the thoracic aorta. Mediastinum/Nodes: Esophagus is  grossly unremarkable. There is no mediastinal, hilar, or axillary lymphadenopathy. Lungs/Pleura: The trachea and central airways are patent. Are numerous small foci of ground-glass opacity in the right middle lobe and a single focus of ground-glass opacity in the right lower lobe. There is a 5 mm solid nodule in the lateral segment of the right middle lobe (6-90). There is a 4 mm solid nodule in the left lower lobe (6-102). This nodule was present in 2009. There is a 3-4 mm nodule more inferolaterally in the left base, also present in 2009 (6-109). These nodules can be considered benign. Musculoskeletal: There is no acute osseous abnormality or suspicious osseous lesion. There is indentation of the inferior T12 endplate by a prominent Schmorl's node, similar to the prior thoracic spine CT from 2021. Other thoracic vertebral body heights are preserved. Review of the MIP images confirms the above findings. CTA ABDOMEN AND PELVIS FINDINGS VASCULAR Aorta: Normal caliber aorta without aneurysm, dissection, or vasculitis. There is  circumferential calcified atherosclerotic plaque without significant stenosis. Celiac: Patent without evidence of aneurysm, dissection, vasculitis or significant stenosis. SMA: Patent without evidence of aneurysm, dissection, or vasculitis. There is calcified plaque at the origin resulting in mild stenosis. Renals: Both renal arteries are patent without evidence of aneurysm, dissection, vasculitis, or fibromuscular dysplasia. There is calcified plaque at the origins resulting in at least moderate stenosis on the left and mild stenosis on the right. IMA: Patent without evidence of aneurysm, dissection, vasculitis or significant stenosis. Inflow: Patent without evidence of aneurysm, dissection, vasculitis or significant stenosis. Scattered calcified plaque. Veins: No obvious venous abnormality within the limitations of this arterial phase study. Review of the MIP images confirms the above findings. NON-VASCULAR Hepatobiliary: The liver and gallbladder are unremarkable. There is no biliary ductal dilatation. Pancreas: Unremarkable. Spleen: Unremarkable. Adrenals/Urinary Tract: Adrenals are unremarkable. The kidneys are unremarkable, with no focal lesion, stone, hydronephrosis, or hydroureter. The bladder is unremarkable. Stomach/Bowel: Stomach is unremarkable there is no evidence of bowel obstruction. There is no abnormal bowel wall thickening or inflammatory change. There are scattered colonic diverticuli without evidence of acute diverticulitis. Lymphatic: There is no abdominopelvic lymphadenopathy. Reproductive: The uterus and adnexa are unremarkable. Other: There is no ascites or free air. Musculoskeletal: Compression deformity of the L1 vertebral body is similar to the CT from 2021. There is indentation of the inferior L2 endplate by a prominent Schmorl's node, also unchanged. There is no acute osseous abnormality or suspicious osseous lesion. Review of the MIP images confirms the above findings. IMPRESSION: 1. No  evidence of aortic dissection or aneurysm. 2. Scattered ground-glass opacities in the right middle lobe are favored infectious or inflammatory in etiology. Recommend follow-up chest CT in 3 months to ensure resolution. The 5 mm solid nodule in the right middle lobe can also be reassessed at this time. 3. No acute pathology in the abdomen or pelvis. 4. Scattered colonic diverticuli without evidence of acute diverticulitis. 5.  Aortic Atherosclerosis (ICD10-I70.0). Electronically Signed   By: Valetta Mole M.D.   On: 08/10/2021 11:57   DG Chest 2 View  Result Date: 08/10/2021 CLINICAL DATA:  Shortness of breath.  Chest pain. EXAM: CHEST - 2 VIEW COMPARISON:  AP chest 01/09/2020 FINDINGS: Cardiac silhouette and mediastinal contours within limits. Moderate calcification within the aortic arch. Mild chronic bilateral interstitial thickening is similar to prior. No focal airspace opacity. No pulmonary edema, pleural effusion, or pneumothorax. Mild-to-moderate multilevel degenerative disc changes of the thoracic spine. IMPRESSION: Chronic bilateral interstitial thickening.  No acute  lung process. Electronically Signed   By: Yvonne Kendall M.D.   On: 08/10/2021 11:00   CT HEAD WO CONTRAST (5MM)  Result Date: 08/10/2021 CLINICAL DATA:  Headache EXAM: CT HEAD WITHOUT CONTRAST TECHNIQUE: Contiguous axial images were obtained from the base of the skull through the vertex without intravenous contrast. RADIATION DOSE REDUCTION: This exam was performed according to the departmental dose-optimization program which includes automated exposure control, adjustment of the mA and/or kV according to patient size and/or use of iterative reconstruction technique. COMPARISON:  Chest CT dated January 09, 2020 FINDINGS: Brain: Chronic white matter ischemic change. No evidence of acute infarction, hemorrhage, hydrocephalus, extra-axial collection or mass lesion/mass effect. Vascular: No hyperdense vessel or unexpected calcification.  Skull: Normal. Negative for fracture or focal lesion. Sinuses/Orbits: No acute finding. Other: None. IMPRESSION: No acute intracranial abnormality. Electronically Signed   By: Yetta Glassman M.D.   On: 08/10/2021 10:42      IMPRESSION AND PLAN:  Assessment and Plan: * CAP (community acquired pneumonia) - This is highly suspicious for aspiration pneumonia. - The patient will be admitted to a cardiac telemetry bed. - We will continue antibiotic therapy with IV Rocephin and Zithromax. - Mucolytic therapy and bronchodilator therapy will be provided. - We will follow blood cultures.  Chest pain - She has associated slightly elevated troponin I. - We will follow serial troponins. - This likely atypical and is related to her dysphagia. - He will be placed on as needed IV morphine sulfate and sublingual nitroglycerin for now.  Dysphagia - This could be related to esophageal stricture or diverticulum. - GI consult to be obtained as she may need EGD and repeat stretching. - I notified Dr. Marius Ditch about the patient  Type 2 diabetes mellitus with peripheral neuropathy ([place]) - The patient will be placed on supplemental coverage with NovoLog. - We will continue basal coverage. - We will continue Neurontin- The patient will be placed on a t  Depression -We will continue Wellbutrin XL and Paxil.  Hyperlipidemia - We will continue statin therapy.  Hypothyroidism - We will continue Synthroid  Acid reflux - We will continue PPI therapy.       DVT prophylaxis: Lovenox.  Advanced Care Planning:  Code Status: full code.  Family Communication:  The plan of care was discussed in details with the patient (and family). I answered all questions. The patient agreed to proceed with the above mentioned plan. Further management will depend upon hospital course. Disposition Plan: Back to previous home environment Consults called: Gastroenterology.  All the records are reviewed and case discussed  with ED provider.  Status is: Inpatient    At the time of the admission, it appears that the appropriate admission status for this patient is inpatient.  This is judged to be reasonable and necessary in order to provide the required intensity of service to ensure the patient's safety given the presenting symptoms, physical exam findings and initial radiographic and laboratory data in the context of comorbid conditions.  The patient requires inpatient status due to high intensity of service, high risk of further deterioration and high frequency of surveillance required.  I certify that at the time of admission, it is my clinical judgment that the patient will require inpatient hospital care extending more than 2 midnights.                            Dispo: The patient is from: Home  Anticipated d/c is to: Home              Patient currently is not medically stable to d/c.              Difficult to place patient: No  Christel Mormon M.D on 08/10/2021 at 1:39 PM  [facility]   From 7 PM-7 AM, contact night-coverage [email or web address]  CC: Primary care physician; Kirk Ruths, MD

## 2021-08-11 DIAGNOSIS — J690 Pneumonitis due to inhalation of food and vomit: Secondary | ICD-10-CM

## 2021-08-11 DIAGNOSIS — J189 Pneumonia, unspecified organism: Secondary | ICD-10-CM | POA: Diagnosis not present

## 2021-08-11 DIAGNOSIS — R1310 Dysphagia, unspecified: Secondary | ICD-10-CM | POA: Diagnosis not present

## 2021-08-11 LAB — CBC
HCT: 34.9 % — ABNORMAL LOW (ref 36.0–46.0)
Hemoglobin: 11.2 g/dL — ABNORMAL LOW (ref 12.0–15.0)
MCH: 29.6 pg (ref 26.0–34.0)
MCHC: 32.1 g/dL (ref 30.0–36.0)
MCV: 92.3 fL (ref 80.0–100.0)
Platelets: 101 10*3/uL — ABNORMAL LOW (ref 150–400)
RBC: 3.78 MIL/uL — ABNORMAL LOW (ref 3.87–5.11)
RDW: 13.1 % (ref 11.5–15.5)
WBC: 4.8 10*3/uL (ref 4.0–10.5)
nRBC: 0 % (ref 0.0–0.2)

## 2021-08-11 LAB — BASIC METABOLIC PANEL
Anion gap: 4 — ABNORMAL LOW (ref 5–15)
BUN: 25 mg/dL — ABNORMAL HIGH (ref 8–23)
CO2: 26 mmol/L (ref 22–32)
Calcium: 8.8 mg/dL — ABNORMAL LOW (ref 8.9–10.3)
Chloride: 109 mmol/L (ref 98–111)
Creatinine, Ser: 1.4 mg/dL — ABNORMAL HIGH (ref 0.44–1.00)
GFR, Estimated: 39 mL/min — ABNORMAL LOW (ref >60)
Glucose, Bld: 98 mg/dL (ref 70–99)
Potassium: 4.1 mmol/L (ref 3.5–5.1)
Sodium: 139 mmol/L (ref 135–145)

## 2021-08-11 LAB — CBG MONITORING, ED
Glucose-Capillary: 128 mg/dL — ABNORMAL HIGH (ref 70–99)
Glucose-Capillary: 196 mg/dL — ABNORMAL HIGH (ref 70–99)

## 2021-08-11 LAB — HEMOGLOBIN A1C
Hgb A1c MFr Bld: 6.7 % — ABNORMAL HIGH (ref 4.8–5.6)
Mean Plasma Glucose: 145.59 mg/dL

## 2021-08-11 LAB — TROPONIN I (HIGH SENSITIVITY): Troponin I (High Sensitivity): 29 ng/L — ABNORMAL HIGH (ref <18)

## 2021-08-11 MED ORDER — CLOPIDOGREL BISULFATE 75 MG PO TABS: 75.0000 mg | ORAL_TABLET | Freq: Every day | ORAL | 0 refills | Status: AC

## 2021-08-11 MED ORDER — TORSEMIDE 10 MG PO TABS: 10.0000 mg | ORAL_TABLET | ORAL | 0 refills | Status: AC

## 2021-08-11 MED ORDER — ROSUVASTATIN CALCIUM 40 MG PO TABS: 40.0000 mg | ORAL_TABLET | Freq: Every day | ORAL | 0 refills | Status: AC

## 2021-08-11 MED ORDER — AMOXICILLIN-POT CLAVULANATE 875-125 MG PO TABS: 1.0000 | ORAL_TABLET | Freq: Two times a day (BID) | ORAL | 0 refills | Status: AC

## 2021-08-11 MED ORDER — NOVOLOG 100 UNIT/ML IJ SOLN: 6.0000 [IU] | Freq: Two times a day (BID) | INTRAMUSCULAR | 0 refills | Status: AC

## 2021-08-11 MED ORDER — BUPROPION HCL ER (XL) 150 MG PO TB24: 450.0000 mg | ORAL_TABLET | Freq: Every day | ORAL | 0 refills | Status: DC

## 2021-08-11 MED ORDER — DOCUSATE SODIUM 100 MG PO CAPS: 100.0000 mg | ORAL_CAPSULE | Freq: Every day | ORAL | 0 refills | Status: AC

## 2021-08-11 MED ORDER — SODIUM CHLORIDE 0.9 % IV SOLN
3.0000 g | Freq: Two times a day (BID) | INTRAVENOUS | Status: DC
  Administered 2021-08-11: 3 g via INTRAVENOUS
  Filled 2021-08-11: qty 8

## 2021-08-11 MED ORDER — ENOXAPARIN SODIUM 30 MG/0.3ML IJ SOSY: 30.0000 mg | PREFILLED_SYRINGE | Freq: Every day | INTRAMUSCULAR | Status: DC

## 2021-08-11 MED ORDER — PAROXETINE HCL 20 MG PO TABS: 20.0000 mg | ORAL_TABLET | Freq: Every day | ORAL | 0 refills | Status: DC

## 2021-08-11 MED ORDER — INSULIN GLARGINE 100 UNIT/ML ~~LOC~~ SOLN: 15.0000 [IU] | Freq: Every day | SUBCUTANEOUS | 0 refills | Status: AC

## 2021-08-11 MED ORDER — LEVOTHYROXINE SODIUM 75 MCG PO TABS: 75.0000 ug | ORAL_TABLET | Freq: Every day | ORAL | 0 refills | Status: AC

## 2021-08-11 MED ORDER — FERROUS SULFATE 325 (65 FE) MG PO TABS: 325.0000 mg | ORAL_TABLET | Freq: Every day | ORAL | 0 refills | Status: AC

## 2021-08-11 NOTE — Progress Notes (Signed)
Patient states she brought a bag of her home medications with her to the hospital in a green reusable [employer] bag and they are now missing. Pharmacy was contacted to see if meds were stored with them. They are not. Patient is concerned about getting insurance to approve refills this early. MD aware of situation and care management contacted.

## 2021-08-11 NOTE — Evaluation (Signed)
Clinical/Bedside Swallow Evaluation Patient Details  Name: Abigail Walker MRN: [id number] Date of Birth: 20-Dec-1945  Today's Date: 08/11/2021 Time: SLP Start Time (ACUTE ONLY): 0945 SLP Stop Time (ACUTE ONLY): 1030 SLP Time Calculation (min) (ACUTE ONLY): 45 min  Past Medical History:  Past Medical History:  Diagnosis Date   Acid reflux 06/16/2013   Adult hypothyroidism 06/16/2013   Last Assessment & Plan:  Tsh and energy have been stable.     Benign hypertension 06/16/2013   Last Assessment & Plan:  Patients blood pressure has seemingly been controlled without significant side effects such as dizziness or slow heart rate.      Cancer ([place]) skin   Sherran Needs syndrome    Chronic kidney disease    Depression    Diabetes mellitus without complication ([place])    type 2   GERD (gastroesophageal reflux disease)    HBV (hepatitis B virus) infection    High blood pressure    Hives    food related   Hypothyroidism    LBBB (left bundle branch block)    Lichen planus    Mixed incontinence    Obstructive apnea 06/16/2013   Sleep apnea    Thyroid disease    Past Surgical History:  Past Surgical History:  Procedure Laterality Date   CATARACT EXTRACTION Bilateral    COLONOSCOPY  2009   ESOPHAGOGASTRODUODENOSCOPY     2009, 2011   ESOPHAGOGASTRODUODENOSCOPY (EGD) WITH PROPOFOL N/A 06/08/2015   Procedure: ESOPHAGOGASTRODUODENOSCOPY (EGD) WITH PROPOFOL;  Surgeon: Hulen Luster, MD;  Location: [facility] ENDOSCOPY;  Service: Gastroenterology;  Laterality: N/A;   HEMORRHOID SURGERY     IMPLANTABLE CONTACT LENS IMPLANTATION     TONSILLECTOMY     TUBAL LIGATION     HPI:  Pt  is a 76 y.o. Caucasian female with medical history significant for GERD, type 2 diabetes mellitus, left bundle branch block, hypothyroidism, lichen planus, OSA, hypertension and hypothyroidism, who presented to the ER with acute onset of substernal chest pain felt as burning and preceded by swallowing all of her medications 1  time last night when she felt a pill went the wrong way down to her lungs.  She kept coughing a few times and could not get it out.  She coughed too hard after that and when she woke up this morning she experienced substernal chest pain and was coughing some sputum that is mainly clear.  She admitted to associated headache.  No nausea or vomiting or diaphoresis.  No radiation to her pain.  She admits to mild dysuria that she relates to her lichen planus without urinary frequency or urgency.  No rhinorrhea or nasal congestion or sore throat.  She previously had esophageal phase dysphagia (Esophagitis dx'd) requiring esophageal stretching and admits to history of reflux as well.   CTA of Chest: No evidence of aortic dissection or aneurysm.  2. Scattered ground-glass opacities in the right middle lobe are  favored infectious or inflammatory in etiology. Recommend follow-up  chest CT in 3 months to ensure resolution. The 5 mm solid nodule in  the right middle lobe can also be reassessed at this time.    Assessment / Plan / Recommendation  Clinical Impression   Pt seen for BSE today. She is A/O x4. She is Edentulous at baseline. On RA, WBC WNL, afebrile. Pt endorsed choking on one of her Pills when taking "many"  at one time w/ liquids(at night).   Pt appears to present w/ adequate oropharyngeal phase swallow function  w/ No oropharyngeal phase dysphagia noted, No neuromuscular deficits noted. Pt consumed po's w/ No overt, clinical s/s of aspiration during po trials. Pt appears at reduced risk for aspiration following general aspiration precautions.   During po trials, pt consumed all consistencies w/ no overt coughing, decline in vocal quality, or change in respiratory presentation during/post trials. O2 sats remained in upper 90s. Oral phase appeared [facility] w/ timely bolus management, mashing/gumming foods, and control of bolus propulsion for A-P transfer for swallowing. Oral clearing achieved w/ all trial  consistencies.  OM Exam appeared [facility] w/ no unilateral weakness noted. Speech Clear. Pt fed self w/ setup support.   Recommend a more Mech Soft diet w/ well-Cut meats, moistened foods d/t Edentulous status and Reflux(baseline); Thin liquids. Recommend general aspiration and Reflux precautions, Pills WHOLE in Puree for safer, easier swallowing as pt described Larger pills causing difficulty to swallow "sometimes", and in setting of this admit. Education given on Pills in Puree - practiced w/ pt/NSG w/ her Tylenol; food consistencies and easy to eat options; general aspiration precautions. NSG to reconsult if any new needs arise. NSG agreed. No further skilled ST services indicated.  SLP Visit Diagnosis: Dysphagia, unspecified (R13.10) (Edentulous at baseline; REFLUX baseline)    Aspiration Risk   (reduced following general precautions)    Diet Recommendation   a more Mech Soft diet w/ well-Cut meats, moistened foods d/t Edentulous status and Reflux(baseline); Thin liquids. Recommend general aspiration and Reflux precautions.   Medication Administration: Whole meds with puree (if needed for safer swallowing vs w/ liquids)    Other  Recommendations Recommended Consults:  (Dietician f/u) Oral Care Recommendations: Oral care BID;Oral care before and after PO;Patient independent with oral care (setup) Other Recommendations:  (n/a)    Recommendations for follow up therapy are one component of a multi-disciplinary discharge planning process, led by the attending physician.  Recommendations may be updated based on patient status, additional functional criteria and insurance authorization.  Follow up Recommendations No SLP follow up      Assistance Recommended at Discharge None  Functional Status Assessment Patient has had a recent decline in their functional status and demonstrates the ability to make significant improvements in function in a reasonable and predictable amount of time.  Frequency and  Duration  (n/a)   (n/a)       Prognosis Prognosis for Safe Diet Advancement: Good Barriers to Reach Goals: Time post onset;Severity of deficits;Behavior      Swallow Study   General Date of Onset: 08/10/21 HPI: Pt  is a 76 y.o. Caucasian female with medical history significant for GERD, type 2 diabetes mellitus, left bundle branch block, hypothyroidism, lichen planus, OSA, hypertension and hypothyroidism, who presented to the ER with acute onset of substernal chest pain felt as burning and preceded by swallowing all of her medications 1 time last night when she felt a pill went the wrong way down to her lungs.  She kept coughing a few times and could not get it out.  She coughed too hard after that and when she woke up this morning she experienced substernal chest pain and was coughing some sputum that is mainly clear.  She admitted to associated headache.  No nausea or vomiting or diaphoresis.  No radiation to her pain.  She admits to mild dysuria that she relates to her lichen planus without urinary frequency or urgency.  No rhinorrhea or nasal congestion or sore throat.  She previously had esophageal phase dysphagia (Esophagitis dx'd) requiring esophageal  stretching and admits to history of reflux as well.   CTA of Chest: No evidence of aortic dissection or aneurysm.  2. Scattered ground-glass opacities in the right middle lobe are  favored infectious or inflammatory in etiology. Recommend follow-up  chest CT in 3 months to ensure resolution. The 5 mm solid nodule in  the right middle lobe can also be reassessed at this time. Type of Study: Bedside Swallow Evaluation Previous Swallow Assessment: none Diet Prior to this Study: Dysphagia 3 (soft);Thin liquids (ordered by MD) Temperature Spikes Noted: No (wbc 4.8) Respiratory Status: Room air History of Recent Intubation: No Behavior/Cognition: Alert;Cooperative;Pleasant mood Oral Cavity Assessment: Within Functional Limits Oral Care Completed  by SLP: Recent completion by staff Oral Cavity - Dentition: Edentulous (baseline) Vision: Functional for self-feeding Self-Feeding Abilities: Able to feed self Patient Positioning: Upright in bed Baseline Vocal Quality: Normal Volitional Cough: Strong Volitional Swallow: Able to elicit    Oral/Motor/Sensory Function Overall Oral Motor/Sensory Function: Within functional limits   Ice Chips Ice chips: Not tested   Thin Liquid Thin Liquid: Within functional limits Presentation: Self Fed;Cup;Straw (~4+ ozs total)    Nectar Thick Nectar Thick Liquid: Not tested   Honey Thick Honey Thick Liquid: Not tested   Puree Puree: Within functional limits Presentation: Self Fed;Spoon (8-9+ bites)   Solid     Solid: Within functional limits (cut, soft/moistened) Presentation: Self Fed;Spoon (7+ bites)         Orinda Kenner, MS, CCC-SLP Speech Language Pathologist Rehab Services; [place] [phone] (ascom)  Woodley Petzold 08/11/2021,11:37 AM

## 2021-08-11 NOTE — Discharge Summary (Signed)
Physician Discharge Summary   Patient: Abigail Walker MRN: [id number] DOB: [birth year]  Admit date:     08/10/2021  Discharge date: 08/11/21  Discharge Physician: Lorella Nimrod   PCP: Kirk Ruths, MD   Recommendations at discharge:  Follow-up with PCP in 1 week Repeat CT chest in 58-month Discharge Diagnoses: Principal Problem:   CAP (community acquired pneumonia) Active Problems:   Chest pain   Dysphagia   Acid reflux   Hypothyroidism   Hyperlipidemia   Depression   Type 2 diabetes mellitus with peripheral neuropathy (HCC)   [facility]   Hospital Course: Taken from H&P.  Abigail LANGIis a 76y.o. Caucasian female with medical history significant for GERD, type 2 diabetes mellitus, left bundle branch block, hypothyroidism, lichen planus, OSA, hypertension and hypothyroidism, who presented to the ER with acute onset of substernal chest pain felt as burning and preceded by swallowing all of her medications 1 time last night when she felt a pill went the wrong way down to her lungs.  She kept coughing a few times and could not get it out.  She coughed too hard after that and when she woke up this morning she experienced substernal chest pain and was coughing some sputum that is mainly clear.  She admitted to associated headache.  No nausea or vomiting or diaphoresis.  No radiation to her pain.  She admits to mild dysuria that she relates to her lichen planus without urinary frequency or urgency.  No rhinorrhea or nasal congestion or sore throat.  She previously had dysphagia requiring esophageal stretching and admits to history of reflux as well.   ED Course: When she came to the ER BP was 201/145 with heart rate of 93 and otherwise normal vital signs.  Later respiratory it was 23 and BP was down to 142/69.  Labs revealed CMP with blood glucose of 220 a BUN of 24 and creatinine of 1.2, total protein of 6.3 with unremarkable CMP.  BNP was 150 and high-sensitivity  troponin was 40.  CBC was within normal except for mild thrombocytopenia of 143 close to previous levels.  COVID-19 second back negative.  UA showed 6-10 WBCs and rare bacteria with negative nitrite I and small leukocytes with 6-10 RBCs.  EKG:  showed sinus rhythm with rate of 93. Imaging: Two-view chest x-ray showed bilateral chronic interstitial thickening with no acute lung process.Chest CTA revealed the following: 1. No evidence of aortic dissection or aneurysm. 2. Scattered ground-glass opacities in the right middle lobe are favored infectious or inflammatory in etiology. Recommend follow-up chest CT in 3 months to ensure resolution. The 5 mm solid nodule in the right middle lobe can also be reassessed at this time. 3. No acute pathology in the abdomen or pelvis. 4. Scattered colonic diverticuli without evidence of acute diverticulitis. 5.  Aortic Atherosclerosis.  Patient initially received ceftriaxone and Zithromax in later converted to Unasyn for concern of aspiration. Speech also evaluated her and she was found to be low risk for aspiration and they recommend Mech soft diet due to lack of teeth and upright to prevent more aspiration episode.  She was instructed not to use multiple tablets 1 time.  Patient denies any difficulty with swallowing.  GI was also consulted from the admitting provider but patient does not want to have any EGD while in the hospital, she would like to follow-up with them as an outpatient if needed.  Patient was stable and wants to go home  during morning rounds.  She was given a 5-day course of Augmentin and instructed to have a repeat CT scan in 3 months as recommended by our radiologist.  Her PCP should be able to follow-up and order that scan.  Patient will continue the rest of her home medications and follow-up with her providers.  Assessment and Plan: * CAP (community acquired pneumonia) - This is highly suspicious for aspiration pneumonia. - The patient  will be admitted to a cardiac telemetry bed. - We will continue antibiotic therapy with IV Rocephin and Zithromax. - Mucolytic therapy and bronchodilator therapy will be provided. - We will follow blood cultures.  Chest pain - She has associated slightly elevated troponin I. - We will follow serial troponins. - This likely atypical and is related to her dysphagia. - He will be placed on as needed IV morphine sulfate and sublingual nitroglycerin for now.  Dysphagia - This could be related to esophageal stricture or diverticulum. - GI consult to be obtained as she may need EGD and repeat stretching. - I notified Dr. Marius Ditch about the patient  Type 2 diabetes mellitus with peripheral neuropathy ([place]) - The patient will be placed on supplemental coverage with NovoLog. - We will continue basal coverage. - We will continue Neurontin- The patient will be placed on a t  Depression -We will continue Wellbutrin XL and Paxil.  Hyperlipidemia - We will continue statin therapy.  Hypothyroidism - We will continue Synthroid  Acid reflux - We will continue PPI therapy.   Consultants: Gastroenterology Procedures performed: None Disposition: Home Diet recommendation:  Discharge Diet Orders (From admission, onward)     Start     Ordered   08/11/21 0000  Diet - low sodium heart healthy        08/11/21 1401           Cardiac and Carb modified diet DISCHARGE MEDICATION: Allergies as of 08/11/2021       Reactions   Metformin And Related Nausea And Vomiting   Sulfa Antibiotics Nausea And Vomiting   Hydrocortisone-iodoquinol  [hydrocortisone-iodoquinol] Rash   Patient reports that she does not believe that she has allergy to this, that she was receiving many medications at once.        Medication List     STOP taking these medications    gabapentin 100 MG capsule Commonly known as: NEURONTIN       TAKE these medications    acetaminophen 500 MG tablet Commonly known as:  TYLENOL Take 500 mg by mouth every 6 (six) hours as needed for moderate pain.   amoxicillin-clavulanate 875-125 MG tablet Commonly known as: AUGMENTIN Take 1 tablet by mouth 2 (two) times daily for 5 days.   buPROPion 150 MG 24 hr tablet Commonly known as: WELLBUTRIN XL Take 450 mg by mouth at bedtime.   cholecalciferol 25 MCG (1000 UNIT) tablet Commonly known as: VITAMIN D3 Take 1,000 Units by mouth daily.   clobetasol ointment 0.05 % Commonly known as: TEMOVATE Apply 1 application topically daily. vagina   clopidogrel 75 MG tablet Commonly known as: PLAVIX Take 75 mg by mouth at bedtime.   docusate sodium 100 MG capsule Commonly known as: COLACE Take 100 mg by mouth daily.   ferrous sulfate 325 (65 FE) MG tablet Take 325 mg by mouth daily with breakfast.   insulin glargine 100 UNIT/ML injection Commonly known as: LANTUS Inject 15 Units into the skin at bedtime.   INSULIN SYRINGE 1CC/31GX5/16" 31G X 5/16" 1 ML Misc  USE ONE SYRINGE TWICE DAILY AS DIRECTED WITH INSULIN VIALS   levothyroxine 75 MCG tablet Commonly known as: SYNTHROID Take 75 mcg by mouth daily before breakfast.   NovoLOG 100 UNIT/ML injection Generic drug: insulin aspart Inject 6 Units into the skin in the morning and at bedtime.   nystatin cream Commonly known as: MYCOSTATIN Apply 1 application topically as needed (yeast).   PARoxetine 20 MG tablet Commonly known as: PAXIL Take 20 mg by mouth at bedtime.   rosuvastatin 40 MG tablet Commonly known as: CRESTOR Take 40 mg by mouth at bedtime.   torsemide 10 MG tablet Commonly known as: DEMADEX Take 10 mg by mouth every other day.        Follow-up Information     Kirk Ruths, MD. Schedule an appointment as soon as possible for a visit in 1 week(s).   Specialty: Internal Medicine Contact information: [place] [address] [phone]                Discharge Exam: Danley Danker Weights   08/10/21 0958   Weight: 55.3 kg   General.     In no acute distress. Pulmonary.  Lungs clear bilaterally, normal respiratory effort. CV.  Regular rate and rhythm, no JVD, rub or murmur. Abdomen.  Soft, nontender, nondistended, BS positive. CNS.  Alert and oriented .  No focal neurologic deficit. Extremities.  No edema, no cyanosis, pulses intact and symmetrical. Psychiatry.  Judgment and insight appears normal.   Condition at discharge: stable  The results of significant diagnostics from this hospitalization (including imaging, microbiology, ancillary and laboratory) are listed below for reference.   Imaging Studies: CT Angio Chest/Abd/Pel for Dissection W and/or Wo Contrast  Result Date: 08/10/2021 CLINICAL DATA:  Chest pain EXAM: CT ANGIOGRAPHY CHEST, ABDOMEN AND PELVIS TECHNIQUE: Non-contrast CT of the chest was initially obtained. Multidetector CT imaging through the chest, abdomen and pelvis was performed using the standard protocol during bolus administration of intravenous contrast. Multiplanar reconstructed images and MIPs were obtained and reviewed to evaluate the vascular anatomy. RADIATION DOSE REDUCTION: This exam was performed according to the departmental dose-optimization program which includes automated exposure control, adjustment of the mA and/or kV according to patient size and/or use of iterative reconstruction technique. CONTRAST:  58m OMNIPAQUE IOHEXOL 350 MG/ML SOLN COMPARISON:  Same day chest radiograph, CT abdomen/pelvis 04/16/2007, thoracic spine and lumbar spine CT 12/05/2019 FINDINGS: CTA CHEST FINDINGS Cardiovascular: There is no evidence of acute intramural hematoma on the initial noncontrast study. There is no evidence of dissection or aneurysm on the postcontrast study. There is no evidence of pulmonary embolism. The heart size is normal. There is no pericardial effusion. There are coronary artery calcifications, aortic valve calcifications, and calcified atherosclerotic plaque in  the thoracic aorta. Mediastinum/Nodes: Esophagus is grossly unremarkable. There is no mediastinal, hilar, or axillary lymphadenopathy. Lungs/Pleura: The trachea and central airways are patent. Are numerous small foci of ground-glass opacity in the right middle lobe and a single focus of ground-glass opacity in the right lower lobe. There is a 5 mm solid nodule in the lateral segment of the right middle lobe (6-90). There is a 4 mm solid nodule in the left lower lobe (6-102). This nodule was present in 2009. There is a 3-4 mm nodule more inferolaterally in the left base, also present in 2009 (6-109). These nodules can be considered benign. Musculoskeletal: There is no acute osseous abnormality or suspicious osseous lesion. There is indentation of the inferior T12 endplate by  a prominent Schmorl's node, similar to the prior thoracic spine CT from 2021. Other thoracic vertebral body heights are preserved. Review of the MIP images confirms the above findings. CTA ABDOMEN AND PELVIS FINDINGS VASCULAR Aorta: Normal caliber aorta without aneurysm, dissection, or vasculitis. There is circumferential calcified atherosclerotic plaque without significant stenosis. Celiac: Patent without evidence of aneurysm, dissection, vasculitis or significant stenosis. SMA: Patent without evidence of aneurysm, dissection, or vasculitis. There is calcified plaque at the origin resulting in mild stenosis. Renals: Both renal arteries are patent without evidence of aneurysm, dissection, vasculitis, or fibromuscular dysplasia. There is calcified plaque at the origins resulting in at least moderate stenosis on the left and mild stenosis on the right. IMA: Patent without evidence of aneurysm, dissection, vasculitis or significant stenosis. Inflow: Patent without evidence of aneurysm, dissection, vasculitis or significant stenosis. Scattered calcified plaque. Veins: No obvious venous abnormality within the limitations of this arterial phase study.  Review of the MIP images confirms the above findings. NON-VASCULAR Hepatobiliary: The liver and gallbladder are unremarkable. There is no biliary ductal dilatation. Pancreas: Unremarkable. Spleen: Unremarkable. Adrenals/Urinary Tract: Adrenals are unremarkable. The kidneys are unremarkable, with no focal lesion, stone, hydronephrosis, or hydroureter. The bladder is unremarkable. Stomach/Bowel: Stomach is unremarkable there is no evidence of bowel obstruction. There is no abnormal bowel wall thickening or inflammatory change. There are scattered colonic diverticuli without evidence of acute diverticulitis. Lymphatic: There is no abdominopelvic lymphadenopathy. Reproductive: The uterus and adnexa are unremarkable. Other: There is no ascites or free air. Musculoskeletal: Compression deformity of the L1 vertebral body is similar to the CT from 2021. There is indentation of the inferior L2 endplate by a prominent Schmorl's node, also unchanged. There is no acute osseous abnormality or suspicious osseous lesion. Review of the MIP images confirms the above findings. IMPRESSION: 1. No evidence of aortic dissection or aneurysm. 2. Scattered ground-glass opacities in the right middle lobe are favored infectious or inflammatory in etiology. Recommend follow-up chest CT in 3 months to ensure resolution. The 5 mm solid nodule in the right middle lobe can also be reassessed at this time. 3. No acute pathology in the abdomen or pelvis. 4. Scattered colonic diverticuli without evidence of acute diverticulitis. 5.  Aortic Atherosclerosis (ICD10-I70.0). Electronically Signed   By: Valetta Mole M.D.   On: 08/10/2021 11:57   DG Chest 2 View  Result Date: 08/10/2021 CLINICAL DATA:  Shortness of breath.  Chest pain. EXAM: CHEST - 2 VIEW COMPARISON:  AP chest 01/09/2020 FINDINGS: Cardiac silhouette and mediastinal contours within limits. Moderate calcification within the aortic arch. Mild chronic bilateral interstitial thickening is  similar to prior. No focal airspace opacity. No pulmonary edema, pleural effusion, or pneumothorax. Mild-to-moderate multilevel degenerative disc changes of the thoracic spine. IMPRESSION: Chronic bilateral interstitial thickening.  No acute lung process. Electronically Signed   By: Yvonne Kendall M.D.   On: 08/10/2021 11:00   CT HEAD WO CONTRAST (5MM)  Result Date: 08/10/2021 CLINICAL DATA:  Headache EXAM: CT HEAD WITHOUT CONTRAST TECHNIQUE: Contiguous axial images were obtained from the base of the skull through the vertex without intravenous contrast. RADIATION DOSE REDUCTION: This exam was performed according to the departmental dose-optimization program which includes automated exposure control, adjustment of the mA and/or kV according to patient size and/or use of iterative reconstruction technique. COMPARISON:  Chest CT dated January 09, 2020 FINDINGS: Brain: Chronic white matter ischemic change. No evidence of acute infarction, hemorrhage, hydrocephalus, extra-axial collection or mass lesion/mass effect. Vascular: No hyperdense vessel or  unexpected calcification. Skull: Normal. Negative for fracture or focal lesion. Sinuses/Orbits: No acute finding. Other: None. IMPRESSION: No acute intracranial abnormality. Electronically Signed   By: Yetta Glassman M.D.   On: 08/10/2021 10:42    Microbiology: Results for orders placed or performed during the hospital encounter of 08/10/21  SARS Coronavirus 2 by RT PCR (hospital order, performed in [facility] hospital lab) *cepheid single result test* Anterior Nasal Swab     Status: None   Collection Time: 08/10/21 10:01 AM   Specimen: Anterior Nasal Swab  Result Value Ref Range Status   SARS Coronavirus 2 by RT PCR NEGATIVE NEGATIVE Final    Comment: (NOTE) SARS-CoV-2 target nucleic acids are NOT DETECTED.  The SARS-CoV-2 RNA is generally detectable in upper and lower respiratory specimens during the acute phase of infection. The lowest concentration  of SARS-CoV-2 viral copies this assay can detect is 250 copies / mL. A negative result does not preclude SARS-CoV-2 infection and should not be used as the sole basis for treatment or other patient management decisions.  A negative result may occur with improper specimen collection / handling, submission of specimen other than nasopharyngeal swab, presence of viral mutation(s) within the areas targeted by this assay, and inadequate number of viral copies (<250 copies / mL). A negative result must be combined with clinical observations, patient history, and epidemiological information.  Fact Sheet for Patients:   https://www.patel.info/  Fact Sheet for Healthcare Providers: https://hall.com/  This test is not yet approved or  cleared by the [place] FDA and has been authorized for detection and/or diagnosis of SARS-CoV-2 by FDA under an Emergency Use Authorization (EUA).  This EUA will remain in effect (meaning this test can be used) for the duration of the COVID-19 declaration under Section 564(b)(1) of the Act, 21 U.S.C. section 360bbb-3(b)(1), unless the authorization is terminated or revoked sooner.  Performed at [facility], [address] [place], [place] [address]     Labs: CBC: Recent Labs  Lab 08/10/21 1001 08/11/21 0339  WBC 6.0 4.8  NEUTROABS 4.7  --   HGB 13.4 11.2*  HCT 41.1 34.9*  MCV 91.1 92.3  PLT 143* 030*   Basic Metabolic Panel: Recent Labs  Lab 08/10/21 1001 08/11/21 0339  NA 139 139  K 3.9 4.1  CL 106 109  CO2 24 26  GLUCOSE 220* 98  BUN 24* 25*  CREATININE 1.20* 1.40*  CALCIUM 9.5 8.8*   Liver Function Tests: Recent Labs  Lab 08/10/21 1001  AST 28  ALT 18  ALKPHOS 73  BILITOT 0.9  PROT 6.3*  ALBUMIN 3.7   CBG: Recent Labs  Lab 08/10/21 1502 08/11/21 0801 08/11/21 1129  GLUCAP 190* 128* 196*    Discharge time spent: greater than 30 minutes.  This record has been  created using Systems analyst. Errors have been sought and corrected,but may not always be located. Such creation errors do not reflect on the standard of care.   Signed: Lorella Nimrod, MD [facility] 08/11/2021

## 2021-08-11 NOTE — Hospital Course (Signed)
Taken from H&P.  Abigail Walker is a 76 y.o. Caucasian female with medical history significant for GERD, type 2 diabetes mellitus, left bundle branch block, hypothyroidism, lichen planus, OSA, hypertension and hypothyroidism, who presented to the ER with acute onset of substernal chest pain felt as burning and preceded by swallowing all of her medications 1 time last night when she felt a pill went the wrong way down to her lungs.  She kept coughing a few times and could not get it out.  She coughed too hard after that and when she woke up this morning she experienced substernal chest pain and was coughing some sputum that is mainly clear.  She admitted to associated headache.  No nausea or vomiting or diaphoresis.  No radiation to her pain.  She admits to mild dysuria that she relates to her lichen planus without urinary frequency or urgency.  No rhinorrhea or nasal congestion or sore throat.  She previously had dysphagia requiring esophageal stretching and admits to history of reflux as well.   ED Course: When she came to the ER BP was 201/145 with heart rate of 93 and otherwise normal vital signs.  Later respiratory it was 23 and BP was down to 142/69.  Labs revealed CMP with blood glucose of 220 a BUN of 24 and creatinine of 1.2, total protein of 6.3 with unremarkable CMP.  BNP was 150 and high-sensitivity troponin was 40.  CBC was within normal except for mild thrombocytopenia of 143 close to previous levels.  COVID-19 second back negative.  UA showed 6-10 WBCs and rare bacteria with negative nitrite I and small leukocytes with 6-10 RBCs.  EKG:  showed sinus rhythm with rate of 93. Imaging: Two-view chest x-ray showed bilateral chronic interstitial thickening with no acute lung process.Chest CTA revealed the following: 1. No evidence of aortic dissection or aneurysm. 2. Scattered ground-glass opacities in the right middle lobe are favored infectious or inflammatory in etiology. Recommend  follow-up chest CT in 3 months to ensure resolution. The 5 mm solid nodule in the right middle lobe can also be reassessed at this time. 3. No acute pathology in the abdomen or pelvis. 4. Scattered colonic diverticuli without evidence of acute diverticulitis. 5.  Aortic Atherosclerosis.  Patient initially received ceftriaxone and Zithromax in later converted to Unasyn for concern of aspiration. Speech also evaluated her and she was found to be low risk for aspiration and they recommend Mech soft diet due to lack of teeth and upright to prevent more aspiration episode.  She was instructed not to use multiple tablets 1 time.  Patient denies any difficulty with swallowing.  GI was also consulted from the admitting provider but patient does not want to have any EGD while in the hospital, she would like to follow-up with them as an outpatient if needed.  Patient was stable and wants to go home during morning rounds.  She was given a 5-day course of Augmentin and instructed to have a repeat CT scan in 3 months as recommended by our radiologist.  Her PCP should be able to follow-up and order that scan.  Patient will continue the rest of her home medications and follow-up with her providers.

## 2021-08-11 NOTE — Progress Notes (Signed)
IV removed from patient. Discharge instructions given. Verbalized understanding. No acute distress at this time. Patient called ride and escorted out to emergency room lobby to wait to ride.

## 2021-08-11 NOTE — Consult Note (Signed)
Pharmacy Antibiotic Note  Abigail Walker is a 76 y.o. female admitted on 08/10/2021 with pneumonia.  Pharmacy has been consulted for Unasyn dosing.  Plan: Unasyn 3g IV every 12 hours  Height: '5\' 1"'$  (154.9 cm) Weight: 55.3 kg (122 lb) IBW/kg (Calculated) : 47.8  Temp (24hrs), Avg:98.7 F (37.1 C), Min:98.3 F (36.8 C), Max:99.7 F (37.6 C)  Recent Labs  Lab 08/10/21 1001 08/11/21 0339  WBC 6.0 4.8  CREATININE 1.20* 1.40*    Estimated Creatinine Clearance: 25.8 mL/min (A) (by C-G formula based on SCr of 1.4 mg/dL (H)).    Allergies  Allergen Reactions   Metformin And Related Nausea And Vomiting   Sulfa Antibiotics Nausea And Vomiting   Hydrocortisone-Iodoquinol  [Hydrocortisone-Iodoquinol] Rash    Patient reports that she does not believe that she has allergy to this, that she was receiving many medications at once.    Antimicrobials this admission: 7/25 Rocephin >> 7/26 7/25 Azithromycin >> 7/26 Unasyn >>     Microbiology results: N/A  Thank you for allowing pharmacy to be a part of this patient's care.  Darrick Penna 08/11/2021 9:28 AM

## 2021-08-11 NOTE — Care Management Obs Status (Signed)
[place] NOTIFICATION   Patient Details  Name: Abigail Walker MRN: [id number] Date of Birth: 1945/06/17   Medicare Observation Status Notification Given:  Yes    Shelbie Hutching, RN 08/11/2021, 3:06 PM

## 2021-08-11 NOTE — Progress Notes (Signed)
PHARMACIST - PHYSICIAN COMMUNICATION  CONCERNING:  Enoxaparin (Lovenox) for DVT Prophylaxis    RECOMMENDATION: Patient was prescribed enoxaprin '40mg'$  q24 hours for VTE prophylaxis.   Filed Weights   08/10/21 0958  Weight: 55.3 kg (122 lb)    Body mass index is 23.05 kg/m.  Estimated Creatinine Clearance: 25.8 mL/min (A) (by C-G formula based on SCr of 1.4 mg/dL (H)).   Patient is candidate for enoxaparin '30mg'$  every 24 hours based on CrCl <91m/min  DESCRIPTION:  Pharmacy [place]  Patient is now receiving enoxaparin 30 mg every 24 hours    CWynelle Cleveland PharmD Clinical Pharmacist  08/11/2021 1:12 PM

## 2021-08-11 NOTE — Care Management CC44 (Signed)
Condition Code 44 Documentation Completed  Patient Details  Name: Abigail Walker MRN: [id number] Date of Birth: 11-Jan-1946   Condition Code 44 given:  Yes Patient signature on Condition Code 44 notice:  Yes Documentation of 2 MD's agreement:  Yes Code 44 added to claim:  Yes    Shelbie Hutching, RN 08/11/2021, 3:06 PM

## 2021-08-12 ENCOUNTER — Encounter (INDEPENDENT_AMBULATORY_CARE_PROVIDER_SITE_OTHER): Payer: Medicare Other

## 2021-08-12 ENCOUNTER — Other Ambulatory Visit (INDEPENDENT_AMBULATORY_CARE_PROVIDER_SITE_OTHER): Payer: Self-pay | Admitting: Vascular Surgery

## 2021-08-12 ENCOUNTER — Ambulatory Visit (INDEPENDENT_AMBULATORY_CARE_PROVIDER_SITE_OTHER): Payer: Medicare Other | Admitting: Nurse Practitioner

## 2021-08-12 DIAGNOSIS — I779 Disorder of arteries and arterioles, unspecified: Secondary | ICD-10-CM

## 2021-09-22 ENCOUNTER — Encounter (INDEPENDENT_AMBULATORY_CARE_PROVIDER_SITE_OTHER): Payer: Self-pay | Admitting: Nurse Practitioner

## 2021-09-22 ENCOUNTER — Ambulatory Visit (INDEPENDENT_AMBULATORY_CARE_PROVIDER_SITE_OTHER): Payer: Medicare Other

## 2021-09-22 ENCOUNTER — Ambulatory Visit (INDEPENDENT_AMBULATORY_CARE_PROVIDER_SITE_OTHER): Payer: Medicare Other | Admitting: Nurse Practitioner

## 2021-09-22 VITALS — BP 129/55 | HR 82 | Resp 16 | Wt 126.2 lb

## 2021-09-22 DIAGNOSIS — I779 Disorder of arteries and arterioles, unspecified: Secondary | ICD-10-CM

## 2021-09-22 DIAGNOSIS — E1151 Type 2 diabetes mellitus with diabetic peripheral angiopathy without gangrene: Secondary | ICD-10-CM | POA: Diagnosis not present

## 2021-09-22 DIAGNOSIS — I10 Essential (primary) hypertension: Secondary | ICD-10-CM

## 2021-10-05 ENCOUNTER — Other Ambulatory Visit: Payer: Self-pay

## 2021-10-05 ENCOUNTER — Encounter
Admission: RE | Admit: 2021-10-05 | Discharge: 2021-10-05 | Disposition: A | Discharge status: Home / Self Care | Payer: Medicare Other | Source: Ambulatory Visit | Attending: Obstetrics and Gynecology | Admitting: Obstetrics and Gynecology

## 2021-10-05 DIAGNOSIS — E1142 Type 2 diabetes mellitus with diabetic polyneuropathy: Secondary | ICD-10-CM

## 2021-10-05 DIAGNOSIS — Z01812 Encounter for preprocedural laboratory examination: Secondary | ICD-10-CM

## 2021-10-05 HISTORY — DX: Unspecified osteoarthritis, unspecified site: M19.90

## 2021-10-05 HISTORY — DX: Esophageal obstruction: K22.2

## 2021-10-05 HISTORY — DX: Dyskinesia of esophagus: K22.4

## 2021-10-05 HISTORY — DX: Cerebral infarction, unspecified: I63.9

## 2021-10-05 HISTORY — DX: Anemia, unspecified: D64.9

## 2021-10-05 NOTE — Patient Instructions (Addendum)
Your procedure is scheduled on: 10/14/21 - Thursday Report to the Registration Desk on the 1st floor of the [facility]. To find out your arrival time, please call [phone] between 1PM - 3PM on: 10/13/21 - Wednesday If your arrival time is 6:00 am, do not arrive prior to that time as the [facility] entrance doors do not open until 6:00 am.  REMEMBER: Instructions that are not followed completely may result in serious medical risk, up to and including death; or upon the discretion of your surgeon and anesthesiologist your surgery may need to be rescheduled.  Do not eat food after midnight the night before surgery.  No gum chewing, lozengers or hard candies.  You may however, drink CLEAR liquids up to 2 hours before you are scheduled to arrive for your surgery. Do not drink anything within 2 hours of your scheduled arrival time. Type 1 and Type 2 diabetics should only drink water.  TAKE THESE MEDICATIONS THE MORNING OF SURGERY WITH A SIP OF WATER:  - levothyroxine (SYNTHROID)  Hold clopidogrel (PLAVIX) 7 days prior to your procedure.  INJECT NONE of your insulin glargine (LANTUS) on the MORNING of  your procedure.  INJECT your NOVOLOG INSULIN on the night before your surgery with your supper, and INJECT NONE of the morning of.  One week prior to surgery: Stop Anti-inflammatories (NSAIDS) such as Advil, Aleve, Ibuprofen, Motrin, Naproxen, Naprosyn and Aspirin based products such as Excedrin, Goodys Powder, BC Powder.  Stop ANY OVER THE COUNTER supplements until after surgery.  You may however, continue to take Tylenol if needed for pain up until the day of surgery.  No Alcohol for 24 hours before or after surgery.  No Smoking including e-cigarettes for 24 hours prior to surgery.  No chewable tobacco products for at least 6 hours prior to surgery.  No nicotine patches on the day of surgery.  Do not use any "recreational" drugs for at least a week prior to your surgery.   Please be advised that the combination of cocaine and anesthesia may have negative outcomes, up to and including death. If you test positive for cocaine, your surgery will be cancelled.  On the morning of surgery brush your teeth with toothpaste and water, you may rinse your mouth with mouthwash if you wish. Do not swallow any toothpaste or mouthwash.  Do not wear jewelry, make-up, hairpins, clips or nail polish.  Do not wear lotions, powders, or perfumes.   Do not shave body from the neck down 48 hours prior to surgery just in case you cut yourself which could leave a site for infection.  Also, freshly shaved skin may become irritated if using the CHG soap.  Contact lenses, hearing aids and dentures may not be worn into surgery.  Do not bring valuables to the hospital. [facility] is not responsible for any missing/lost belongings or valuables.   Notify your doctor if there is any change in your medical condition (cold, fever, infection).  Wear comfortable clothing (specific to your surgery type) to the hospital.  After surgery, you can help prevent lung complications by doing breathing exercises.  Take deep breaths and cough every 1-2 hours. Your doctor may order a device called an Incentive Spirometer to help you take deep breaths. When coughing or sneezing, hold a pillow firmly against your incision with both hands. This is called "splinting." Doing this helps protect your incision. It also decreases belly discomfort.  If you are being admitted to the hospital overnight, leave  your suitcase in the car. After surgery it may be brought to your room.  If you are being discharged the day of surgery, you will not be allowed to drive home. You will need a responsible adult (18 years or older) to drive you home and stay with you that night.   If you are taking public transportation, you will need to have a responsible adult (18 years or older) with you. Please confirm with your  physician that it is acceptable to use public transportation.   Please call the [facility]. at [phone] if you have any questions about these instructions.  Surgery Visitation Policy:  Patients undergoing a surgery or procedure may have two family members or support persons with them as long as the person is not COVID-19 positive or experiencing its symptoms.   Inpatient Visitation:    Visiting hours are 7 a.m. to 8 p.m. Up to four visitors are allowed at one time in a patient room, including children. The visitors may rotate out with other people during the day. One designated support person (adult) may remain overnight.

## 2021-10-05 NOTE — H&P (Signed)
Abigail Walker is a 76 y.o. female here for Pre Op Consulting (Reschedule surgery - hx of PMB, previously was scheduled for D&C ) .   History of Present Illness: Patient presents for PreOp exam prior to [facility] with hysteroscopy for further investigation of postmenopausal bleeding:. She had an annual exam with me in 05/2020. At this time she mentioned vaginal bleeding and on exam, blood was in vault. Pap was unable to be collected due to lichen and loss of architecture and stenosis. Pt would not be able to tolerate an EMBx in the office. We discussed D&C or Exam under Anesthesia in OR. She returned 12/2020 for surgical evaluation. She did not show for her ultrasound.    Today: She is now ready for her procedure and has someone to drive her    Pertinent hx:   -On derm exam 11/2019: labia minora with severe scarring with loss of normal anatomy, right labia minora ulcerated, erythema improving w/ less ulceration of left labia minora -Dx oral & genital lichen planus; clobetasol & esterase for genitalia, Tacrolimus for mouth              -Derm in 09/2019 increased clobetasol ointment to vaginal opening & labia BID                   -"Possible lichen sclerosus" per derm, with "extensive scarring"    -MUI; s/p urology, s/p percutaneous tibial nerve stimulation tx in 2016-2017 -Vaginal irritation secondary to incontinence, atrophy too; vaginal estrogen helps with vulvar burning & irritation w/ urination - 4 weeks of "spotting" in 03/22 with small amount of bright red bloo dvisible on pad   -HTN, on meds -CKD stage 3 -MGUS (monocloncal gammopathy of unknown significance); follows onc   -Occipital stroke in late 2021; cognitive impairment, frequent falls; follows neuro -Hx of major depression, on meds -Charlie bonnet syndrome; 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 right ear, a lady is speaking on the phone professionally in her left ear              -Per ear doctor, her hearing loss has triggered her "brain decide to want to hear  something" -Weight loss of 175 lbs over the course of 15 years      Past Medical History:  has a past medical history of Depression, Essential hypertension, benign, GERD (gastroesophageal reflux disease), Hepatitis B, Hyperlipidemia, Hypothyroidism, Lichen planus, Restless leg syndrome, Secondary hyperparathyroidism, renal (CMS-HCC), Skin cancer, basal cell (01/17/2009), Sleep apnea, and Type 2 diabetes mellitus (CMS-HCC) (01/17/1990).  Past Surgical History:  has a past surgical history that includes Tonsillectomy; Extraction Teeth; Tubal ligation (Bilateral); Hemorrhoid surgery; Basal cell skin cancer excision; Colonoscopy (02/22/2007); egd (02/22/2007, 10/22/2009); and egd (06/08/2015). Family History: family history includes Bipolar disorder in her daughter and mother; Coronary Artery Disease (Blocked arteries around heart) in her father; Diabetes in her paternal grandmother; Diverticulosis in her father; Other in her daughter and mother; Thrombosis in her father. Social History:  reports that she quit smoking about 26 years ago. Her smoking use included cigarettes. She has never used smokeless tobacco. She reports that she does not drink alcohol and does not use drugs. OB/GYN History:  OB History     Gravida 1   Para 1   Term 1   Preterm     AB     Living 1     SAB     IAB     Ectopic     Molar     Multiple  Live Births            Allergies: is allergic to amoxicillin-pot clavulanate, metformin, other, sulfa (sulfonamide antibiotics), sulfasalazine, alcortin a [hydrocortisone-iodoquinl-aloe2], and hydrocortisone-iodoquinol. Medications: Current Outpatient Medications:    acetaminophen (TYLENOL) 500 MG tablet, Take 1,000 mg by mouth as needed for Pain, Disp: , Rfl:    blood glucose diagnostic (GLUCOSE BLOOD) test strip, 1 each (1 strip total) 3 (three) times daily for 30 days Use as instructed., Disp: 200 each, Rfl: 11   blood glucose meter kit, by XX  route 2 (two) times daily for 30 days, Disp: 1 each, Rfl: 0   buPROPion (WELLBUTRIN XL) 150 MG XL tablet, Take 3 tablet po QD as directed, Disp: , Rfl:    cholecalciferol (VITAMIN D3) 2,000 unit tablet, Take by mouth, Disp: , Rfl:    clobetasoL (TEMOVATE) 0.05 % ointment, Apply topically Apply 1 application topically daily. vagina, Disp: , Rfl:    clopidogreL (PLAVIX) 75 mg tablet, Take 1 tablet (75 mg total) by mouth once daily, Disp: 90 tablet, Rfl: 1   ferrous sulfate 325 (65 FE) MG tablet, Take 325 mg by mouth once daily   , Disp: , Rfl:    gabapentin (NEURONTIN) 100 MG capsule, Take 1 capsule (100 mg total) by mouth at bedtime May increase to 2 tablets nightly if needed., Disp: 30 capsule, Rfl: 0   insulin ASPART (NOVOLOG) injection (concentration 100 units/mL), Inject 6 units twice daily. Take before morning meal and before the supper meal., Disp: 10 mL, Rfl: 11   insulin GLARGINE (LANTUS) injection (concentration 100 units/mL), Inject 15 Units subcutaneously once daily Take in the morning., Disp: 10 mL, Rfl: 5   insulin syringe-needle U-100 (BD INSULIN SYRINGE ULTRA-FINE) 1 mL 31 gauge x 5/16 syringe, 2 (two) times daily, Disp: 100 each, Rfl: 11   insulin syringe-needle U-100 (BD INSULIN SYRINGE ULTRA-FINE) 1 mL 31 gauge x 5/16 syringe, 3 (three) times daily As instructed with insulin., Disp: 100 each, Rfl: 3   lancing device with lancets kit, Use 1 each 3 (three) times daily Use as instructed., Disp: 100 each, Rfl: 11   levothyroxine (SYNTHROID) 75 MCG tablet, TAKE 1 TABLET BY MOUTH ONCE DAILY ON AN EMPTY STOMACH WITH  A  GLASS  OF  WATER  AT  LEAST  30  TO  60  MINUTES  BEFORE  BREAKFAST, Disp: 90 tablet, Rfl: 1   lisinopriL (ZESTRIL) 5 MG tablet, Take 5 mg by mouth once daily, Disp: , Rfl:    ONETOUCH DELICA PLUS LANCET, 3 (three) times daily Use as instructed., Disp: 100 each, Rfl: 5   PARoxetine (PAXIL) 40 MG tablet, Take 40 mg by mouth once daily   , Disp: , Rfl:    pen needle, diabetic  31 gauge x 3/16" needle, Use, Disp: , Rfl:    rosuvastatin (CRESTOR) 40 MG tablet, Take 1 tablet by mouth once daily, Disp: 60 tablet, Rfl: 0   terbinafine HCL (LAMISIL) 250 mg tablet, Take 1 tablet (250 mg total) by mouth once daily, Disp: 90 tablet, Rfl: 0   TORsemide (DEMADEX) 10 MG tablet, Take 1 tablet by mouth Every other day, Disp: , Rfl:      Review of Systems: See HPI    Exam:   BP 134/65   Pulse 83   Ht 154.9 cm (_0 )   Wt 56.6 kg (124 lb 12.8 oz)   BMI 23.58 kg/m  Body mass index is 23.58 kg/m.     Constitutional:  General appearance: Well nourished, well developed female in no acute distress.  Neuro/psych:  Normal mood and affect. No gross motor deficits. Neck:  Supple, normal appearance.  Respiratory:  Normal respiratory effort.  Cardiovascular:  No lower extremity edema.  Skin:  No rashes, ulcers, or skin lesions noted. No excessive hirsutism or acne noted. Warm and dry.  Abdomen: soft , no mass, non-tender, no rebound tenderness. Pelvic: deferred Impression:   The primary encounter diagnosis was PMB (postmenopausal bleeding). Diagnoses of Lichen planus and Atrophic vaginitis were also pertinent to this visit.   Plan:   1. Postmenopausal bleeding:  We discussed the possibilities of atrophy, benign endometrial polyp, hyperplasia and cancer. We discussed hysteroscopic polypectomy for the result of polyp. We discussed the possibilities of hyperplasia, treatment option dependent upon grade. We discussed oncology referral for carcinoma.   Her pelvic exams are very limited due to lichen planus with loss of architecture, vaginal stenosis, and pain.   I recommended an exam under anesthesia due to patient's level of discomfort and extent of limited pelvic exam. Patient is amendable to an exam under anesthesia. In the past, we have discussed my recommendation for vaginal dilators prior to the procedure so that her recovery following could be faster and easier for  her.    We discussed that she should anticipate some pain and bleeding following the procedure.    Risks of surgery were discussed with the patient including but not limited to: bleeding which may require transfusion; infection which may require antibiotics; injury to uterus or surrounding organs; intrauterine scarring which may impair future fertility; need for additional procedures including laparotomy or laparoscopy; and other postoperative/anesthesia complications. Written informed consent was obtained.   This is a scheduled same-day surgery. She will have a postop visit in 2 weeks to review operative findings and pathology.

## 2021-10-12 ENCOUNTER — Encounter: Payer: Self-pay | Admitting: Obstetrics and Gynecology

## 2021-10-13 MED ORDER — CHLORHEXIDINE GLUCONATE 0.12 % MT SOLN: 15.0000 mL | Freq: Once | OROMUCOSAL | Status: AC

## 2021-10-13 MED ORDER — SODIUM CHLORIDE 0.9 % IV SOLN: INTRAVENOUS | Status: DC

## 2021-10-13 MED ORDER — ORAL CARE MOUTH RINSE: 15.0000 mL | Freq: Once | OROMUCOSAL | Status: AC

## 2021-10-13 MED ORDER — LACTATED RINGERS IV SOLN: INTRAVENOUS | Status: DC

## 2021-10-14 ENCOUNTER — Encounter: Payer: Self-pay | Admitting: Obstetrics and Gynecology

## 2021-10-14 ENCOUNTER — Ambulatory Visit: Payer: Medicare Other | Admitting: Urgent Care

## 2021-10-14 ENCOUNTER — Other Ambulatory Visit: Payer: Self-pay

## 2021-10-14 ENCOUNTER — Encounter
Admission: RE | Disposition: A | Discharge status: Home / Self Care | Payer: Self-pay | Source: Home / Self Care | Attending: Obstetrics and Gynecology

## 2021-10-14 ENCOUNTER — Observation Stay
Admission: RE | Admit: 2021-10-14 | Discharge: 2021-10-15 | Disposition: A | Discharge status: Home / Self Care | Payer: Medicare Other | Attending: Obstetrics and Gynecology | Admitting: Obstetrics and Gynecology

## 2021-10-14 DIAGNOSIS — N895 Stricture and atresia of vagina: Secondary | ICD-10-CM | POA: Insufficient documentation

## 2021-10-14 DIAGNOSIS — Z7902 Long term (current) use of antithrombotics/antiplatelets: Secondary | ICD-10-CM | POA: Insufficient documentation

## 2021-10-14 DIAGNOSIS — E1142 Type 2 diabetes mellitus with diabetic polyneuropathy: Secondary | ICD-10-CM

## 2021-10-14 DIAGNOSIS — Z85828 Personal history of other malignant neoplasm of skin: Secondary | ICD-10-CM | POA: Insufficient documentation

## 2021-10-14 DIAGNOSIS — Z01812 Encounter for preprocedural laboratory examination: Secondary | ICD-10-CM

## 2021-10-14 DIAGNOSIS — I129 Hypertensive chronic kidney disease with stage 1 through stage 4 chronic kidney disease, or unspecified chronic kidney disease: Secondary | ICD-10-CM | POA: Insufficient documentation

## 2021-10-14 DIAGNOSIS — N183 Chronic kidney disease, stage 3 unspecified: Secondary | ICD-10-CM | POA: Diagnosis not present

## 2021-10-14 DIAGNOSIS — Z87891 Personal history of nicotine dependence: Secondary | ICD-10-CM | POA: Insufficient documentation

## 2021-10-14 DIAGNOSIS — E039 Hypothyroidism, unspecified: Secondary | ICD-10-CM | POA: Diagnosis not present

## 2021-10-14 DIAGNOSIS — E1122 Type 2 diabetes mellitus with diabetic chronic kidney disease: Secondary | ICD-10-CM | POA: Diagnosis not present

## 2021-10-14 DIAGNOSIS — Z9889 Other specified postprocedural states: Secondary | ICD-10-CM

## 2021-10-14 DIAGNOSIS — C541 Malignant neoplasm of endometrium: Principal | ICD-10-CM | POA: Insufficient documentation

## 2021-10-14 DIAGNOSIS — N711 Chronic inflammatory disease of uterus: Secondary | ICD-10-CM | POA: Diagnosis not present

## 2021-10-14 DIAGNOSIS — Z79899 Other long term (current) drug therapy: Secondary | ICD-10-CM | POA: Diagnosis not present

## 2021-10-14 DIAGNOSIS — N950 Postmenopausal bleeding: Secondary | ICD-10-CM | POA: Diagnosis present

## 2021-10-14 HISTORY — DX: Chronic kidney disease, stage 3 unspecified: N18.30

## 2021-10-14 HISTORY — DX: Type 2 diabetes mellitus without complications: E11.9

## 2021-10-14 HISTORY — DX: Long term (current) use of antithrombotics/antiplatelets: Z79.02

## 2021-10-14 HISTORY — DX: Basal cell carcinoma of skin, unspecified: C44.91

## 2021-10-14 HISTORY — DX: Atherosclerotic heart disease of native coronary artery without angina pectoris: I25.10

## 2021-10-14 HISTORY — PX: DILATATION & CURETTAGE/HYSTEROSCOPY WITH MYOSURE: SHX6511

## 2021-10-14 HISTORY — PX: HYSTEROSCOPY WITH D & C: SHX1775

## 2021-10-14 HISTORY — DX: Disorder of arteries and arterioles, unspecified: I77.9

## 2021-10-14 HISTORY — DX: Hyperlipidemia, unspecified: E78.5

## 2021-10-14 HISTORY — DX: Restless legs syndrome: G25.81

## 2021-10-14 HISTORY — DX: Atherosclerosis of aorta: I70.0

## 2021-10-14 HISTORY — DX: Long term (current) use of insulin: Z79.4

## 2021-10-14 HISTORY — PX: LYSIS OF ADHESION: SHX5961

## 2021-10-14 HISTORY — DX: Secondary hyperparathyroidism of renal origin: N25.81

## 2021-10-14 LAB — BASIC METABOLIC PANEL
Anion gap: 7 (ref 5–15)
Anion gap: 5 (ref 5–15)
BUN: 28 mg/dL — ABNORMAL HIGH (ref 8–23)
BUN: 24 mg/dL — ABNORMAL HIGH (ref 8–23)
CO2: 27 mmol/L (ref 22–32)
CO2: 27 mmol/L (ref 22–32)
Calcium: 9.7 mg/dL (ref 8.9–10.3)
Calcium: 9.1 mg/dL (ref 8.9–10.3)
Chloride: 107 mmol/L (ref 98–111)
Chloride: 108 mmol/L (ref 98–111)
Creatinine, Ser: 1.27 mg/dL — ABNORMAL HIGH (ref 0.44–1.00)
Creatinine, Ser: 1.1 mg/dL — ABNORMAL HIGH (ref 0.44–1.00)
GFR, Estimated: 44 mL/min — ABNORMAL LOW (ref >60)
GFR, Estimated: 52 mL/min — ABNORMAL LOW (ref >60)
Glucose, Bld: 128 mg/dL — ABNORMAL HIGH (ref 70–99)
Glucose, Bld: 199 mg/dL — ABNORMAL HIGH (ref 70–99)
Potassium: 5.1 mmol/L (ref 3.5–5.1)
Potassium: 5.2 mmol/L — ABNORMAL HIGH (ref 3.5–5.1)
Sodium: 141 mmol/L (ref 135–145)
Sodium: 140 mmol/L (ref 135–145)

## 2021-10-14 LAB — CBC
HCT: 38.2 % (ref 36.0–46.0)
HCT: 37.9 % (ref 36.0–46.0)
Hemoglobin: 12.3 g/dL (ref 12.0–15.0)
Hemoglobin: 12.3 g/dL (ref 12.0–15.0)
MCH: 29.2 pg (ref 26.0–34.0)
MCH: 30.1 pg (ref 26.0–34.0)
MCHC: 32.2 g/dL (ref 30.0–36.0)
MCHC: 32.5 g/dL (ref 30.0–36.0)
MCV: 90.7 fL (ref 80.0–100.0)
MCV: 92.7 fL (ref 80.0–100.0)
Platelets: 124 10*3/uL — ABNORMAL LOW (ref 150–400)
Platelets: 105 10*3/uL — ABNORMAL LOW (ref 150–400)
RBC: 4.21 MIL/uL (ref 3.87–5.11)
RBC: 4.09 MIL/uL (ref 3.87–5.11)
RDW: 13.2 % (ref 11.5–15.5)
RDW: 13.1 % (ref 11.5–15.5)
WBC: 2.9 10*3/uL — ABNORMAL LOW (ref 4.0–10.5)
WBC: 2.9 10*3/uL — ABNORMAL LOW (ref 4.0–10.5)
nRBC: 0 % (ref 0.0–0.2)
nRBC: 0 % (ref 0.0–0.2)

## 2021-10-14 LAB — GLUCOSE, CAPILLARY
Glucose-Capillary: 99 mg/dL (ref 70–99)
Glucose-Capillary: 87 mg/dL (ref 70–99)
Glucose-Capillary: 206 mg/dL — ABNORMAL HIGH (ref 70–99)

## 2021-10-14 LAB — TYPE AND SCREEN
ABO/RH(D): O POS
Antibody Screen: NEGATIVE

## 2021-10-14 SURGERY — DILATATION AND CURETTAGE /HYSTEROSCOPY
Anesthesia: General | Site: Vagina

## 2021-10-14 MED ORDER — LIDOCAINE HCL (CARDIAC) PF 100 MG/5ML IV SOSY
PREFILLED_SYRINGE | INTRAVENOUS | Status: DC | PRN
  Administered 2021-10-14: 50 mg via INTRAVENOUS

## 2021-10-14 MED ORDER — SILVER NITRATE-POT NITRATE 75-25 % EX MISC
CUTANEOUS | Status: AC
  Filled 2021-10-14: qty 10

## 2021-10-14 MED ORDER — DROPERIDOL 2.5 MG/ML IJ SOLN: 0.6250 mg | Freq: Once | INTRAMUSCULAR | Status: DC | PRN

## 2021-10-14 MED ORDER — ACETAMINOPHEN 500 MG PO TABS
500.0000 mg | ORAL_TABLET | Freq: Four times a day (QID) | ORAL | Status: DC | PRN
  Administered 2021-10-14 – 2021-10-15 (×3): 500 mg via ORAL
  Filled 2021-10-14 (×3): qty 1

## 2021-10-14 MED ORDER — LABETALOL HCL 5 MG/ML IV SOLN
10.0000 mg | INTRAVENOUS | Status: DC | PRN
  Administered 2021-10-14: 10 mg via INTRAVENOUS

## 2021-10-14 MED ORDER — FAMOTIDINE 20 MG PO TABS
ORAL_TABLET | ORAL | Status: AC
  Administered 2021-10-14: 20 mg via ORAL
  Filled 2021-10-14: qty 1

## 2021-10-14 MED ORDER — ESTROGENS CONJUGATED 0.625 MG/GM VA CREA
TOPICAL_CREAM | VAGINAL | Status: AC
  Filled 2021-10-14: qty 30

## 2021-10-14 MED ORDER — FENTANYL CITRATE (PF) 100 MCG/2ML IJ SOLN
25.0000 ug | INTRAMUSCULAR | Status: DC | PRN
  Administered 2021-10-14: 25 ug via INTRAVENOUS

## 2021-10-14 MED ORDER — GLYCOPYRROLATE 0.2 MG/ML IJ SOLN
INTRAMUSCULAR | Status: AC
  Filled 2021-10-14: qty 1

## 2021-10-14 MED ORDER — FENTANYL CITRATE (PF) 100 MCG/2ML IJ SOLN
INTRAMUSCULAR | Status: AC
  Administered 2021-10-14: 25 ug via INTRAVENOUS
  Filled 2021-10-14: qty 2

## 2021-10-14 MED ORDER — ESTROGENS CONJUGATED 0.625 MG/GM VA CREA
TOPICAL_CREAM | VAGINAL | Status: DC | PRN
  Administered 2021-10-14: 1 via VAGINAL

## 2021-10-14 MED ORDER — PAROXETINE HCL 20 MG PO TABS
20.0000 mg | ORAL_TABLET | Freq: Every day | ORAL | Status: DC
  Administered 2021-10-14: 20 mg via ORAL
  Filled 2021-10-14: qty 1

## 2021-10-14 MED ORDER — DEXAMETHASONE SODIUM PHOSPHATE 10 MG/ML IJ SOLN
INTRAMUSCULAR | Status: DC | PRN
  Administered 2021-10-14: 5 mg via INTRAVENOUS

## 2021-10-14 MED ORDER — FENTANYL CITRATE (PF) 100 MCG/2ML IJ SOLN
INTRAMUSCULAR | Status: AC
  Filled 2021-10-14: qty 2

## 2021-10-14 MED ORDER — TERBINAFINE HCL 250 MG PO TABS
250.0000 mg | ORAL_TABLET | Freq: Every day | ORAL | Status: DC
  Administered 2021-10-14: 250 mg via ORAL
  Filled 2021-10-14 (×2): qty 1

## 2021-10-14 MED ORDER — DOCUSATE SODIUM 100 MG PO CAPS
100.0000 mg | ORAL_CAPSULE | Freq: Every day | ORAL | Status: DC
  Administered 2021-10-14 – 2021-10-15 (×2): 100 mg via ORAL
  Filled 2021-10-14 (×2): qty 1

## 2021-10-14 MED ORDER — FENTANYL CITRATE (PF) 100 MCG/2ML IJ SOLN
INTRAMUSCULAR | Status: DC | PRN
  Administered 2021-10-14 (×4): 25 ug via INTRAVENOUS

## 2021-10-14 MED ORDER — VITAMIN D3 25 MCG (1000 UNIT) PO TABS
2000.0000 [IU] | ORAL_TABLET | Freq: Every day | ORAL | Status: DC
  Administered 2021-10-14 – 2021-10-15 (×2): 2000 [IU] via ORAL
  Filled 2021-10-14 (×2): qty 2

## 2021-10-14 MED ORDER — PROPOFOL 10 MG/ML IV BOLUS
INTRAVENOUS | Status: AC
  Filled 2021-10-14: qty 20

## 2021-10-14 MED ORDER — ACETAMINOPHEN 10 MG/ML IV SOLN
1000.0000 mg | Freq: Once | INTRAVENOUS | Status: DC | PRN
  Administered 2021-10-14: 1000 mg via INTRAVENOUS

## 2021-10-14 MED ORDER — DEXAMETHASONE SODIUM PHOSPHATE 10 MG/ML IJ SOLN
INTRAMUSCULAR | Status: AC
  Filled 2021-10-14: qty 1

## 2021-10-14 MED ORDER — LABETALOL HCL 5 MG/ML IV SOLN
INTRAVENOUS | Status: AC
  Filled 2021-10-14: qty 4

## 2021-10-14 MED ORDER — PROMETHAZINE HCL 25 MG/ML IJ SOLN: 6.2500 mg | INTRAMUSCULAR | Status: DC | PRN

## 2021-10-14 MED ORDER — IBUPROFEN 600 MG PO TABS
600.0000 mg | ORAL_TABLET | Freq: Four times a day (QID) | ORAL | Status: DC | PRN
  Filled 2021-10-14: qty 1

## 2021-10-14 MED ORDER — INSULIN ASPART 100 UNIT/ML IJ SOLN
6.0000 [IU] | Freq: Every day | INTRAMUSCULAR | Status: DC
  Administered 2021-10-14: 6 [IU] via SUBCUTANEOUS
  Filled 2021-10-14: qty 1

## 2021-10-14 MED ORDER — ONDANSETRON HCL 4 MG PO TABS: 4.0000 mg | ORAL_TABLET | Freq: Four times a day (QID) | ORAL | Status: DC | PRN

## 2021-10-14 MED ORDER — LEVOTHYROXINE SODIUM 75 MCG PO TABS
75.0000 ug | ORAL_TABLET | Freq: Every day | ORAL | Status: DC
  Administered 2021-10-15: 75 ug via ORAL
  Filled 2021-10-14 (×2): qty 1

## 2021-10-14 MED ORDER — TORSEMIDE 10 MG PO TABS
10.0000 mg | ORAL_TABLET | ORAL | Status: DC
  Administered 2021-10-14: 10 mg via ORAL
  Filled 2021-10-14: qty 1

## 2021-10-14 MED ORDER — WHITE PETROLATUM EX OINT
TOPICAL_OINTMENT | CUTANEOUS | Status: DC | PRN
  Administered 2021-10-14: 0.2 via TOPICAL
  Filled 2021-10-14: qty 28.35

## 2021-10-14 MED ORDER — MENTHOL 3 MG MT LOZG: 1.0000 | LOZENGE | OROMUCOSAL | Status: DC | PRN

## 2021-10-14 MED ORDER — ONDANSETRON HCL 4 MG/2ML IJ SOLN
INTRAMUSCULAR | Status: AC
  Filled 2021-10-14: qty 2

## 2021-10-14 MED ORDER — CHLORHEXIDINE GLUCONATE 0.12 % MT SOLN
OROMUCOSAL | Status: AC
  Administered 2021-10-14: 15 mL via OROMUCOSAL
  Filled 2021-10-14: qty 15

## 2021-10-14 MED ORDER — LIDOCAINE-PRILOCAINE 2.5-2.5 % EX CREA
TOPICAL_CREAM | CUTANEOUS | Status: DC | PRN
  Administered 2021-10-14: 1 via TOPICAL
  Filled 2021-10-14 (×3): qty 5

## 2021-10-14 MED ORDER — FAMOTIDINE 20 MG PO TABS: 20.0000 mg | ORAL_TABLET | Freq: Once | ORAL | Status: AC

## 2021-10-14 MED ORDER — ACETAMINOPHEN 10 MG/ML IV SOLN
INTRAVENOUS | Status: AC
  Filled 2021-10-14: qty 100

## 2021-10-14 MED ORDER — INSULIN GLARGINE-YFGN 100 UNIT/ML ~~LOC~~ SOLN
15.0000 [IU] | Freq: Every day | SUBCUTANEOUS | Status: DC
  Administered 2021-10-14 – 2021-10-15 (×2): 15 [IU] via SUBCUTANEOUS
  Filled 2021-10-14 (×3): qty 0.15

## 2021-10-14 MED ORDER — BUPROPION HCL ER (XL) 300 MG PO TB24
450.0000 mg | ORAL_TABLET | Freq: Every day | ORAL | Status: DC
  Administered 2021-10-14: 450 mg via ORAL
  Filled 2021-10-14: qty 1

## 2021-10-14 MED ORDER — PROPOFOL 10 MG/ML IV BOLUS
INTRAVENOUS | Status: DC | PRN
  Administered 2021-10-14 (×2): 100 mg via INTRAVENOUS

## 2021-10-14 MED ORDER — ROSUVASTATIN CALCIUM 20 MG PO TABS
40.0000 mg | ORAL_TABLET | Freq: Every day | ORAL | Status: DC
  Administered 2021-10-14: 40 mg via ORAL
  Filled 2021-10-14: qty 2

## 2021-10-14 MED ORDER — LACTATED RINGERS IV SOLN
INTRAVENOUS | Status: DC
  Administered 2021-10-15: 125 mL/h via INTRAVENOUS

## 2021-10-14 MED ORDER — ONDANSETRON HCL 4 MG/2ML IJ SOLN
INTRAMUSCULAR | Status: DC | PRN
  Administered 2021-10-14: 4 mg via INTRAVENOUS

## 2021-10-14 MED ORDER — ONDANSETRON HCL 4 MG/2ML IJ SOLN: 4.0000 mg | Freq: Four times a day (QID) | INTRAMUSCULAR | Status: DC | PRN

## 2021-10-14 MED ORDER — SODIUM CHLORIDE 0.9 % IR SOLN
Status: DC | PRN
  Administered 2021-10-14 (×2): 3000 mL

## 2021-10-14 MED ORDER — SIMETHICONE 80 MG PO CHEW: 80.0000 mg | CHEWABLE_TABLET | Freq: Four times a day (QID) | ORAL | Status: DC | PRN

## 2021-10-14 MED ORDER — ALUM & MAG HYDROXIDE-SIMETH 200-200-20 MG/5ML PO SUSP: 30.0000 mL | ORAL | Status: DC | PRN

## 2021-10-14 MED ORDER — OXYCODONE HCL 5 MG PO TABS
5.0000 mg | ORAL_TABLET | ORAL | Status: DC | PRN
  Administered 2021-10-14 – 2021-10-15 (×2): 5 mg via ORAL
  Administered 2021-10-15: 10 mg via ORAL
  Filled 2021-10-14: qty 2
  Filled 2021-10-14: qty 1
  Filled 2021-10-14: qty 2

## 2021-10-14 MED ORDER — GLYCOPYRROLATE 0.2 MG/ML IJ SOLN
INTRAMUSCULAR | Status: DC | PRN
  Administered 2021-10-14: .2 mg via INTRAVENOUS

## 2021-10-14 SURGICAL SUPPLY — 25 items
BAG PRESSURE INF REUSE 1000 (BAG) ×2 IMPLANT
BASIN KIT SINGLE STR (MISCELLANEOUS) ×2 IMPLANT
DEVICE MYOSURE LITE (MISCELLANEOUS) IMPLANT
DRSG TELFA 3X8 NADH STRL (GAUZE/BANDAGES/DRESSINGS) IMPLANT
ELECT REM PT RETURN 9FT ADLT (ELECTROSURGICAL) ×2
ELECTRODE REM PT RTRN 9FT ADLT (ELECTROSURGICAL) ×2 IMPLANT
GLOVE BIO SURGEON STRL SZ7 (GLOVE) ×2 IMPLANT
GLOVE SURG UNDER LTX SZ7.5 (GLOVE) ×2 IMPLANT
GOWN STRL REUS W/ TWL LRG LVL3 (GOWN DISPOSABLE) ×4 IMPLANT
GOWN STRL REUS W/TWL LRG LVL3 (GOWN DISPOSABLE) ×4
IV NS IRRIG 3000ML ARTHROMATIC (IV SOLUTION) ×2 IMPLANT
KIT PROCEDURE FLUENT (KITS) ×2 IMPLANT
KIT TURNOVER CYSTO (KITS) ×2 IMPLANT
MANIFOLD NEPTUNE II (INSTRUMENTS) ×2 IMPLANT
PACK DNC HYST (MISCELLANEOUS) ×2 IMPLANT
PAD PREP 24X41 OB/GYN DISP (PERSONAL CARE ITEMS) ×2 IMPLANT
SCOPETTES 8  STERILE (MISCELLANEOUS) ×2
SCOPETTES 8 STERILE (MISCELLANEOUS) IMPLANT
SCRUB CHG 4% DYNA-HEX 4OZ (MISCELLANEOUS) ×2 IMPLANT
SEAL ROD LENS SCOPE MYOSURE (ABLATOR) ×2 IMPLANT
SET CYSTO W/LG BORE CLAMP LF (SET/KITS/TRAYS/PACK) IMPLANT
SOL PREP POV-IOD 4OZ 10% (MISCELLANEOUS) IMPLANT
TRAP FLUID SMOKE EVACUATOR (MISCELLANEOUS) ×2 IMPLANT
TUBING CONNECTING 10 (TUBING) ×2 IMPLANT
WATER STERILE IRR 500ML POUR (IV SOLUTION) ×2 IMPLANT

## 2021-10-14 NOTE — Anesthesia Preprocedure Evaluation (Addendum)
Anesthesia Evaluation  Patient identified by MRN, date of birth, ID band Patient awake    Reviewed: Allergy & Precautions, NPO status , Patient's Chart, lab work & pertinent test results  Airway Mallampati: III  TM Distance: >3 FB Neck ROM: full    Dental  (+) Edentulous Upper, Edentulous Lower   Pulmonary sleep apnea , former smoker,    Pulmonary exam normal        Cardiovascular Exercise Tolerance: Good hypertension, + CAD and + Peripheral Vascular Disease  Normal cardiovascular exam+ dysrhythmias (LBBB)   Carotid artery disease- 09/22/2021: 1-39% BICA  ECHO NORMAL LEFT VENTRICULAR SYSTOLIC FUNCTION  WITH MILD LVH  NORMAL RIGHT VENTRICULAR SYSTOLIC FUNCTION  MILD VALVULAR REGURGITATION (See above)  NO VALVULAR STENOSIS  MILD MR,TR  EF 50-55%    CTA IMPRESSION: 1. No evidence of aortic dissection or aneurysm. 2. Scattered ground-glass opacities in the right middle lobe are favored infectious or inflammatory in etiology. Recommend follow-up chest CT in 3 months to ensure resolution. The 5 mm solid nodule in the right middle lobe can also be reassessed at this time. 3. No acute pathology in the abdomen or pelvis. 4. Scattered colonic diverticuli without evidence of acute diverticulitis. 5.  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 left vision decreased)    GI/Hepatic Neg liver ROS, GERD  Controlled and Medicated,Dysphagia   Endo/Other  diabetes, Insulin DependentHypothyroidism   Renal/GU Renal InsufficiencyRenal disease     Musculoskeletal  (+) Arthritis ,   Abdominal Normal abdominal exam  (+)   Peds  Hematology  (+) Blood dyscrasia, anemia , thrombocytopenia   Anesthesia  Other Findings Past Medical History: 06/16/2013: Adult hypothyroidism No date: Anemia No date: Aortic atherosclerosis (HCC) No date: Arthritis 06/16/2013: Benign hypertension No date: Carotid artery disease ([place])     Comment:  a.) carotid US 02/10/2020: 1-39% BICA; b.) carotid US               09/22/2021: 1-39% BICA No date: Sherran Needs syndrome No date: CKD (chronic kidney disease), stage III (HCC) No date: Coronary artery calcification seen on CT scan No date: Depression No date: Esophageal spasm No date: Esophageal stricture No date: GERD (gastroesophageal reflux disease) No date: HBV (hepatitis B virus) infection No date: High blood pressure No date: HLD (hyperlipidemia) No date: Hypothyroidism No date: LBBB (left bundle branch block) No date: Lichen planus No date: Long term current use of antithrombotics/antiplatelets     Comment:  a.) clopidogrel No date: Mixed incontinence 06/16/2013: OSA on CPAP 2023: Pneumonia No date: RLS (restless legs syndrome) No date: Secondary hyperparathyroidism of renal origin ([place]) No date: Skin cancer, basal cell No date: Sleep apnea No date: Stroke [facility]) No date: Thyroid disease No date: Type 2 diabetes mellitus treated with insulin ([place])  Past Surgical History: No date: CATARACT EXTRACTION; Bilateral 2009: COLONOSCOPY No date: ESOPHAGOGASTRODUODENOSCOPY     Comment:  2009, 2011 06/08/2015: ESOPHAGOGASTRODUODENOSCOPY (EGD) WITH PROPOFOL; N/A     Comment:  Procedure: ESOPHAGOGASTRODUODENOSCOPY (EGD) WITH               PROPOFOL;  Surgeon: Hulen Luster, MD;  Location: [facility]               ENDOSCOPY;  Service: Gastroenterology;  Laterality: N/A; No date: HEMORRHOID SURGERY  No date: IMPLANTABLE CONTACT LENS IMPLANTATION No date: TONSILLECTOMY No date: TUBAL LIGATION     Reproductive/Obstetrics negative OB ROS                            Anesthesia Physical Anesthesia Plan  ASA: 3  Anesthesia Plan: General    Post-op Pain Management: Minimal or no pain anticipated   Induction: Intravenous  PONV Risk Score and Plan: 3 and Ondansetron and Dexamethasone  Airway Management Planned: LMA  Additional Equipment:   Intra-op Plan:   Post-operative Plan: Extubation in OR  Informed Consent: I have reviewed the patients History and Physical, chart, labs and discussed the procedure including the risks, benefits and alternatives for the proposed anesthesia with the patient or authorized representative who has indicated his/her understanding and acceptance.     Dental Advisory Given  Plan Discussed with: Anesthesiologist, CRNA and Surgeon  Anesthesia Plan Comments:        Anesthesia Quick Evaluation

## 2021-10-14 NOTE — Discharge Instructions (Addendum)
Discharge instructions after a hysteroscopy with dilation and curettage   You have four creams to apply to your vagina and vulva. EMLA cream for pain control. Apply it to your skin whenever you need. Vaseline: clean and dry this area, numb it if you need to, and dry it. Then coat your vulva in vaseline to protect it from rubbing. You can put this on as often as you'd like. Topical estrogen cream, brand name Premarin. You can put this on your vulva and inside the vagina while it's healing, and then twice weekly into the vagina to help it stop bleeding and maybe be less irritated. Clobetosol: this is the steroir cream you have at home, that you're supposed to rub into the vulva twice a week to keep your labia from scarring and healing stuck together.   Signs and Symptoms to Report  Call our office at [phone] if you have any of the following:    Fever over 100.4 degrees or higher  Severe stomach pain not relieved with pain medications  Bright red bleeding that's heavier than a period that does not slow with rest after the first 24 hours  To go the bathroom a lot (frequency), you can't hold your urine (urgency), or it hurts when you empty your bladder (urinate)  Chest pain  Shortness of breath  Pain in the calves of your legs  Severe nausea and vomiting not relieved with anti-nausea medications  Any concerns  What You Can Expect after Surgery  You may see some pink tinged, bloody fluid. This is normal. You may also have cramping for several days.   Activities after Your Discharge Follow these guidelines to help speed your recovery at home:  Don't drive if you are in pain or taking narcotic pain medicine. You may drive when you can safely slam on the brakes, turn the wheel forcefully, and rotate your torso comfortably. This is typically 4-7 days. Practice in a parking lot or side street prior to attempting to drive regularly.   Ask others to help with household chores for 4 weeks.   Don't do strenuous activities, exercises, or sports like vacuuming, tennis, squash, etc. until your doctor says it is safe to do so.  Walk as you feel able. Rest often since it may take a week or two for your energy level to return to normal.   You may climb stairs  Avoid constipation:   -Eat fruits, vegetables, and whole grains. Eat small meals as your appetite will take time to return to normal.   -Drink 6 to 8 glasses of water each day unless your doctor has told you to limit your fluids.   -Use a laxative or stool softener as needed if constipation becomes a problem. You may take Miralax, metamucil, Citrucil, Colace, Senekot, FiberCon, etc. If this does not relieve the constipation, try two tablespoons of Milk Of Magnesia every 8 hours until your bowels move.   You may shower.   Do not get in a hot tub, swimming pool, etc. until your doctor agrees.  Do not douche, use tampons, or have sex until your doctor says it is okay, usually about 2 weeks.  Take your pain medicine when you need it. The medicine may not work as well if the pain is bad.  Take the medicines you were taking before surgery. Other medications you might need are pain medications (ibuprofen), medications for constipation (Colace) and nausea medications (Zofran).    Here is a helpful article from the website  [employer], regarding constipation  Here are reasons why constipation occurs after surgery: 1) During the operation and in the recovery room, most people are given opioid pain medication, primarily through an IV, to treat moderate or severe pain. Intravenous opioids include morphine, Dilaudid and fentanyl. After surgery, patients are often prescribed opioid pain medication to take by mouth at home, including codeine, Vicodin, Norco, and Percocet. All of these medications cause constipation by slowing down the movement of your intestine. 2) Changes in your diet before surgery can be another culprit. It is common to get  specific instructions to change how you normally eat or drink before your surgery, like only having liquids the day before or not having anything to eat or drink after midnight the night before surgery. For this reason, temporary dehydration may occur. This, along with not eating or only having liquids, means that you are getting less fiber than usual. Both these factors contribute to constipation. 3) Changes in your diet after surgery can also contribute to the problem. Although many people don't have dietary restrictions after operations, being under anesthesia can make you lose your appetite for several hours and maybe even days. Some people can even have nausea or vomiting. Not eating or drinking normally means that you are not getting enough fiber and you can get dehydrated, both leading to constipation. 4) Lying in a bed more than usual--which happens before, during and after surgery--combined with the medications and diet changes, all work together to slow down your colon and make your poop turn to rock.  No one likes to be constipated.  Let's face it, it's not a pleasant feeling when you don't poop for days, then strain on the toilet to finally pass something large enough to cause damage. An ounce of prevention is worth a pound of cure, so: Assume you will be constipated. Plan and prepare accordingly. Post-surgery is one of those unique situations where the temporary use of laxatives can make a world of difference. Always consult with your doctor, and recognize that if you wait several days after surgery to take a laxative, the constipation might be too severe for these over-the-counter options. It is always important to discuss all medications you plan on taking with your doctor. Ask your doctor if you can start the laxative immediately after surgery. *  Here are go-to post-surgery laxatives: Senna: Senna is an herb that acts as a "stimulant laxative," meaning it increases the activity of the  intestine to cause you to have a bowel movement. It comes in many forms, but senna pills are easy to take and are sold over the counter at almost all pharmacies. Since opioid pain medications slow down the activity of the intestine, it makes sense to take a medication to help reverse that side effect. Long-term use of a stimulant laxative is not a good idea since it can make your colon "lazy" and not function properly; however, temporary use immediately after surgery is acceptable. In general, if you are able to eat a normal diet, taking senna soon after surgery works the best. Senna usually works within hours to produce a bowel movement, but this is less predictable when you are taking different medications after surgery. Try not to wait several days to start taking senna, as often it is too late by then. Just like with all medications or supplements, check with your doctor before starting new treatment.   Magnesium: Magnesium is an important mineral that our body needs. We get magnesium from some foods  that we eat, especially foods that are high in fiber such as broccoli, almonds and whole grains. There are also magnesium-based medications used to treat constipation including milk of magnesia (magnesium hydroxide), magnesium citrate and magnesium oxide. They work by drawing water into the intestine, putting it into the class of "osmotic" laxatives. Magnesium products in low doses appear to be safe, but if taken in very large doses, can lead to problems such as irregular heartbeat, low blood pressure and even death. It can also affect other medications you might be taking, therefore it is important to discuss using magnesium with your physician and pharmacist before initiating therapy. Most over-the-counter magnesium laxatives work very well to help with the constipation related to surgery, but sometimes they work too well and lead to diarrhea. Make sure you are somewhere with easy access to a bathroom, just in  case.   Bisacodyl: Bisacodyl (generic name) is sold under brand names such as Dulcolax. Much like senna, it is a "stimulant laxative," meaning it makes your intestines move more quickly to push out the stool. This is another good choice to start taking as soon as your doctor says you can take a laxative after surgery. It comes in pill form and as a suppository, which is a good choice for people who cannot or are not allowed to swallow pills. Studies have shown that it works as a laxative, but like most of these medications, you should use this on a short-term basis only.   Enema: Enemas strike fear in many people, but FEAR NOT! It's nowhere near as big a deal as you may think. An enema is just a way to get some liquid into your rectum by placing a specially designed device through your anus. If you have never done one, it might seem like a painful, unpleasant, uncomfortable, complicated and lengthy procedure. But in reality, it's simple, takes just a few seconds and is highly effective. The small ready-made bottles you buy at the pharmacy are much easier than the hose/large rubber container type. Those recommended positions illustrated in some instructions are generally not necessary to place the enema. It's very similar to the insertion of a tampon, requiring a slight squat. Some extra lubrication on the enema's tip (or on your anus) will make it a breeze. In certain cases, there is no substitute for a good enema. For example, if someone has not pooped for a few days, the beginning of the poop waiting to come out can become rock hard. Passing that hard stool can lead to much pain and problems like anal fissures. Inserting a little liquid to break up the rock-hard stool will help make its passage much easier. Enemas come with different liquids. Most come with saline, but there are also mineral oil options. You can also use warm water in the reusable enema containers. They all work. But since saline can  sometimes be irritating, so try a mineral oil or water enema instead.  Here are commonly recommended constipation medications that do not work well for post-surgery constipation: Docusate: Docusate (generic name) most commonly referred to as Colace (brand name) is not really a laxative, but is classified as a stool softener. Although this medication is commonly prescribed, it is not recommended for several reasons: 1) there is no good medical evidence that it works 2) even if it has an effect, which is very questionable, it is minimal and cannot combat the intestinal slowing caused by the opioid medications. Skip docusate to save money and space  in your pillbox for something more effective.  PEG: Miralax (brand name) is basically a chemical called polyethylene glycol (PEG) and it has gained tremendous popularity as a laxative. This product is an "osmotic laxative" meaning it works by pulling water into the stool, making it softer. This is very similar to the action of natural fiber in foods and supplements. Therefore, the effect seen by this medication is not immediate, causing a bowel movement in a day or more. Is this medication strong enough to battle the constipation related to having an operation? Maybe for some people not prone to constipation. But for most people, other laxatives are better to prevent constipation after surgery.

## 2021-10-14 NOTE — Progress Notes (Signed)
Dr.Beasley notified of pt admission to unit and need of admit orders . Md notified and updated of pt status, pain, vitals and report from PACU. No new urgent orders given. Pt not in any distress. Awaiting orders from attending.

## 2021-10-14 NOTE — Op Note (Signed)
Operative Report Hysteroscopy with Dilation and Curettage   Indications: Postmenopausal bleeding with vaginal stenosis   Pre-operative Diagnosis: PMB   Post-operative Diagnosis: same.  Procedure: 1. Exam under anesthesia 2. D&C 3. Hysteroscopy 4. Polypectomy 5. Lysis of vaginal adhesions  Surgeon: Benjaman Kindler, MD  Assistant(s):  None  Anesthesia: General LMA anesthesia  Anesthesiologist: Darrin Nipper, MD Anesthesiologist: Darrin Nipper, MD CRNA: Loni Dolly, CRNA  Estimated Blood Loss:  Minimal         Intraoperative medications:  Toradol         Total IV Fluids: 824m  Urine Output: not assessed  Total Fluid Deficit:  30 mL          Specimens: Endometrial curettings         Complications:  None; patient tolerated the procedure well.         Disposition: PACU - hemodynamically stable.         Condition: stable  Findings: Uterus measuring 7 cm by sound; normal cervix, perineum.  Patient with a history of lichen planus, and she did have labial agglutination with a small vaginal opening.  This was bluntly dissected out, and her vaginal canal was also stenotic.  Vaginal adhesions were taken down bluntly, to allow for circumferential small speculum placed in the posterior vagina.  This allowed for visualization of the cervix, which was anterior.  Uterine lining was atrophic except for on the left side with polypoid tissue.  There was bleeding throughout.  Both ostia were visualized, and clots present in the cornua on both sides.  Indication for procedure/Consents: [id number]. F  here for scheduled surgery for the aforementioned diagnoses.     Risks of surgery were discussed with the patient including but not limited to: bleeding which may require transfusion; infection which may require antibiotics; injury to uterus or surrounding organs; intrauterine scarring which may impair future fertility; need for additional procedures including laparotomy or laparoscopy;  and other postoperative/anesthesia complications. Written informed consent was obtained.    Procedure Details:  D&C/ Myosure  The patient was taken to the operating room where anesthesia was administered and was found to be adequate.  After a formal and adequate timeout was performed, she was placed in the dorsal lithotomy position and examined with the above findings. She was then prepped and draped in the sterile manner.   Because of her vaginal stenosis, the vagina was prepped carefully as the stenotic adhesions were opened digitally.  A small posterior blade of the speculum was then placed in the patient's vagina and a single tooth tenaculum was applied to the anterior lip of the cervix.  Her cervix was serially dilated to [place] dil[place].  Because the cervix was stenotic and the vagina of small caliber, I did not feel comfortable entering the cervix sharply and an ECC was not performed.  However, I could safely introduce the hysteroscope under direct observation using lactated ringers as a distention medium to reveal the above findings. The uterine cavity was carefully examined, both ostia were recognized, and the findings above were noted.   This was resected using the Myosure device.  Sampling was taken from all 4 quadrants.  After further careful visualization of the uterine cavity, the hysteroscope was removed under direct visualization.  A sharp curettage was not performed as again the vagina and cervix were stenotic enough to discourage passage of sharp instruments which would not be directly visible.  The tenaculum was removed from the anterior lip of the cervix  and the vaginal speculum was removed after assuring hemostasis.  Topical estrogen cream was placed on the external genitalia which had been opened, and along the vaginal canal.  I will recommend she continue Premarin cream nightly for the rest of the month and then as needed.  The patient tolerated the procedure well  and was taken to the recovery area awake and in stable condition. She received iv acetaminophen and Toradol prior to leaving the OR.  The patient will be discharged to home tomorrow after overnight observation due to frailty and medical condition.   Routine postoperative instructions given.  She will be prescribed Ibuprofen and Colace.  She will follow up in the clinic in two weeks for postoperative evaluation.

## 2021-10-14 NOTE — Transfer of Care (Signed)
Immediate Anesthesia Transfer of Care Note  Patient: Abigail Walker  Procedure(s) Performed: DILATATION AND CURETTAGE /HYSTEROSCOPY (Vagina ) MYOSURE POLYPECTOMY LYSIS OF VAGINAL ADHESIONS  Patient Location: PACU  Anesthesia Type:General  Level of Consciousness: awake  Airway & Oxygen Therapy: Patient Spontanous Breathing  Post-op Assessment: Report given to RN and Patient moving all extremities  Post vital signs: Reviewed  Last Vitals:  Vitals Value Taken Time  BP 187/71 10/14/21 1615  Temp 36.3 C 10/14/21 1603  Pulse 95 10/14/21 1616  Resp 9 10/14/21 1616  SpO2 94 % 10/14/21 1616  Vitals shown include unvalidated device data.  Last Pain:  Vitals:   10/14/21 1004  TempSrc: Oral         Complications: Patient with ECG changes upon arrival in PACU from OR. 12 lead ECG obtained and compared to previous. Previous is closely similar to current rhythm/rate. Patient denies chest pain and shortness of breath.

## 2021-10-14 NOTE — Interval H&P Note (Signed)
History and Physical Interval Note:  10/14/2021 2:32 PM  Abigail Walker  has presented today for surgery, with the diagnosis of post menopausal bleeding, cervical stenosis.  The various methods of treatment have been discussed with the patient and family. After consideration of risks, benefits and other options for treatment, the patient has consented to  Procedure(s): DILATATION AND CURETTAGE /HYSTEROSCOPY (N/A) as a surgical intervention.  The patient's history has been reviewed, patient examined, no change in status, stable for surgery.  I have reviewed the patient's chart and labs.  Questions were answered to the patient's satisfaction.     Benjaman Kindler

## 2021-10-14 NOTE — Progress Notes (Signed)
Pt arrived to unit room 343, bedside report received from  Anda Kraft ,[place] PACU Nurse. Pt in no distress . Pt Awake, alert and oriented x3.

## 2021-10-15 ENCOUNTER — Encounter (INDEPENDENT_AMBULATORY_CARE_PROVIDER_SITE_OTHER): Payer: Self-pay | Admitting: Nurse Practitioner

## 2021-10-15 DIAGNOSIS — C541 Malignant neoplasm of endometrium: Secondary | ICD-10-CM | POA: Diagnosis not present

## 2021-10-15 LAB — BASIC METABOLIC PANEL
Anion gap: 1 — ABNORMAL LOW (ref 5–15)
BUN: 25 mg/dL — ABNORMAL HIGH (ref 8–23)
CO2: 27 mmol/L (ref 22–32)
Calcium: 8.6 mg/dL — ABNORMAL LOW (ref 8.9–10.3)
Chloride: 107 mmol/L (ref 98–111)
Creatinine, Ser: 1.29 mg/dL — ABNORMAL HIGH (ref 0.44–1.00)
GFR, Estimated: 43 mL/min — ABNORMAL LOW (ref >60)
Glucose, Bld: 142 mg/dL — ABNORMAL HIGH (ref 70–99)
Potassium: 5.1 mmol/L (ref 3.5–5.1)
Sodium: 135 mmol/L (ref 135–145)

## 2021-10-15 MED ORDER — LIDOCAINE-PRILOCAINE 2.5-2.5 % EX CREA: 1.0000 | TOPICAL_CREAM | CUTANEOUS | 0 refills | Status: AC | PRN

## 2021-10-15 MED ORDER — TERBINAFINE HCL 250 MG PO TABS: 250.0000 mg | ORAL_TABLET | Freq: Every day | ORAL | Status: DC

## 2021-10-15 NOTE — Progress Notes (Signed)
D/C home with neighbor who checks on her every day

## 2021-10-15 NOTE — Progress Notes (Signed)
Subjective:    Patient ID: Abigail Walker, female    DOB: [birth year], 76 y.o.   MRN: [id number] Chief Complaint  Patient presents with   Follow-up    Ultrasound follow up    The patient is seen for follow up evaluation of carotid stenosis. The carotid stenosis followed by ultrasound.   The patient denies amaurosis fugax. There is no recent history of TIA symptoms or focal motor deficits. There is no prior documented CVA.  The patient is taking enteric-coated aspirin 81 mg daily.  There is no history of migraine headaches. There is no history of seizures.  No recent shortening of the patient's walking distance or new symptoms consistent with claudication.  No history of rest pain symptoms. No new ulcers or wounds of the lower extremities have occurred.  There is no history of DVT, PE or superficial thrombophlebitis. No recent episodes of angina or shortness of breath documented.   Carotid Duplex done today shows 1 to 39% stenosis bilaterally.  No change compared to last study in 2022      Review of Systems  All other systems reviewed and are negative.      Objective:   Physical Exam Vitals reviewed.  HENT:     Head: Normocephalic.  Cardiovascular:     Rate and Rhythm: Normal rate.  Pulmonary:     Effort: Pulmonary effort is normal.  Skin:    General: Skin is warm and dry.  Neurological:     Mental Status: She is alert and oriented to person, place, and time.  Psychiatric:        Mood and Affect: Mood normal.        Behavior: Behavior normal.        Thought Content: Thought content normal.        Judgment: Judgment normal.     BP (!) 129/55 (BP Location: Right Arm)   Pulse 82   Resp 16   Wt 126 lb 3.2 oz (57.2 kg)   BMI 23.85 kg/m   Past Medical History:  Diagnosis Date   Adult hypothyroidism 06/16/2013   Anemia    Aortic atherosclerosis (HCC)    Arthritis    Benign hypertension 06/16/2013   Carotid artery disease ([place])    a.) carotid US 02/10/2020:  1-39% BICA; b.) carotid US 09/22/2021: 1-39% BICA   Sherran Needs syndrome    CKD (chronic kidney disease), stage III (HCC)    Coronary artery calcification seen on CT scan    Depression    Esophageal spasm    Esophageal stricture    GERD (gastroesophageal reflux disease)    HBV (hepatitis B virus) infection    High blood pressure    HLD (hyperlipidemia)    Hypothyroidism    LBBB (left bundle branch block)    Lichen planus    Long term current use of antithrombotics/antiplatelets    a.) clopidogrel   Mixed incontinence    OSA on CPAP 06/16/2013   Pneumonia 2023   RLS (restless legs syndrome)    Secondary hyperparathyroidism of renal origin ([place])    Skin cancer, basal cell    Sleep apnea    Stroke ([place])    Thyroid disease    Type 2 diabetes mellitus treated with insulin ([place])     Social History   Socioeconomic History   Marital status: Single    Spouse name: Not on file   Number of children: Not on file   Years of education: Not on file  Highest education level: Not on file  Occupational History   Not on file  Tobacco Use   Smoking status: Former    Types: Cigarettes    Quit date: 07/27/1993    Years since quitting: 28.2   Smokeless tobacco: Never   Tobacco comments:    quit 20 years  Vaping Use   Vaping Use: Never used  Substance and Sexual Activity   Alcohol use: No    Alcohol/week: 0.0 standard drinks of alcohol   Drug use: No   Sexual activity: Not Currently    Birth control/protection: Post-menopausal  Other Topics Concern   Not on file  Social History Narrative   Lives alone   Social Determinants of Health   Financial Resource Strain: Not on file  Food Insecurity: Not on file  Transportation Needs: Not on file  Physical Activity: Not on file  Stress: Not on file  Social Connections: Not on file  Intimate Partner Violence: Not on file    Past Surgical History:  Procedure Laterality Date   CATARACT EXTRACTION Bilateral    COLONOSCOPY  2009    ESOPHAGOGASTRODUODENOSCOPY     2009, 2011   ESOPHAGOGASTRODUODENOSCOPY (EGD) WITH PROPOFOL N/A 06/08/2015   Procedure: ESOPHAGOGASTRODUODENOSCOPY (EGD) WITH PROPOFOL;  Surgeon: Hulen Luster, MD;  Location: [facility] ENDOSCOPY;  Service: Gastroenterology;  Laterality: N/A;   HEMORRHOID SURGERY     IMPLANTABLE CONTACT LENS IMPLANTATION     TONSILLECTOMY     TUBAL LIGATION      Family History  Problem Relation Age of Onset   Heart disease Father    Cancer Mother    Kidney failure Maternal Aunt     Allergies  Allergen Reactions   Amoxicillin-Pot Clavulanate Rash   Metformin And Related Nausea And Vomiting   Sulfa Antibiotics Nausea And Vomiting   Hydrocortisone-Iodoquinol  [Hydrocortisone-Iodoquinol] Rash    Patient reports that she does not believe that she has allergy to this, that she was receiving many medications at once.       Latest Ref Rng & Units 10/14/2021    7:42 PM 10/14/2021   10:44 AM 08/11/2021    3:39 AM  CBC  WBC 4.0 - 10.5 K/uL 2.9  2.9  4.8   Hemoglobin 12.0 - 15.0 g/dL 12.3  12.3  11.2   Hematocrit 36.0 - 46.0 % 37.9  38.2  34.9   Platelets 150 - 400 K/uL 105  124  101       CMP     Component Value Date/Time   NA 140 10/14/2021 1942   NA 140 05/19/2011 0409   K 5.2 (H) 10/14/2021 1942   K 4.2 05/19/2011 0409   CL 108 10/14/2021 1942   CL 105 05/19/2011 0409   CO2 27 10/14/2021 1942   CO2 28 05/19/2011 0409   GLUCOSE 199 (H) 10/14/2021 1942   GLUCOSE 158 (H) 05/19/2011 0409   BUN 24 (H) 10/14/2021 1942   BUN 15 05/19/2011 0409   CREATININE 1.10 (H) 10/14/2021 1942   CREATININE 1.18 05/19/2011 0409   CALCIUM 9.1 10/14/2021 1942   CALCIUM 9.2 05/19/2011 0409   PROT 6.3 (L) 08/10/2021 1001   PROT 7.1 05/14/2011 1432   ALBUMIN 3.7 08/10/2021 1001   ALBUMIN 3.2 (L) 05/14/2011 1432   AST 28 08/10/2021 1001   AST 20 05/14/2011 1432   ALT 18 08/10/2021 1001   ALT 20 05/14/2011 1432   ALKPHOS 73 08/10/2021 1001   ALKPHOS 88 05/14/2011 1432   BILITOT  0.9 08/10/2021  1001   BILITOT 0.3 05/14/2011 1432   GFRNONAA 52 (L) 10/14/2021 1942   GFRNONAA 48 (L) 05/19/2011 0409   GFRAA 37 (L) 01/09/2019 1432   GFRAA 56 (L) 05/19/2011 0409     No results found.     Assessment & Plan:   1. Carotid artery disease, unspecified laterality, unspecified type ([place]) Recommend:  Given the patient's asymptomatic subcritical stenosis no further invasive testing or surgery at this time.  Duplex ultrasound shows 1-39% stenosis bilaterally.  Continue antiplatelet therapy as prescribed Continue management of CAD, HTN and Hyperlipidemia Healthy heart diet,  encouraged exercise at least 4 times per week Follow up in 12 months with duplex ultrasound and physical exam   - VAS US CAROTID  2. Benign hypertension Continue antihypertensive medications as already ordered, these medications have been reviewed and there are no changes at this time.   3. Type 2 diabetes mellitus with diabetic peripheral angiopathy without gangrene, unspecified whether long term insulin use ([place]) Continue hypoglycemic medications as already ordered, these medications have been reviewed and there are no changes at this time.  Hgb A1C to be monitored as already arranged by primary service    Current Facility-Administered Medications on File Prior to Visit  Medication Dose Route Frequency Provider Last Rate Last Admin   acetaminophen (OFIRMEV) 10 MG/ML IV            acetaminophen (TYLENOL) tablet 500 mg  500 mg Oral Q6H PRN Benjaman Kindler, MD   500 mg at 10/14/21 2116   alum & mag hydroxide-simeth (MAALOX/MYLANTA) 200-200-20 MG/5ML suspension 30 mL  30 mL Oral Q4H PRN Benjaman Kindler, MD       buPROPion (WELLBUTRIN XL) 24 hr tablet 450 mg  450 mg Oral QHS Benjaman Kindler, MD   450 mg at 10/14/21 2117   cholecalciferol (VITAMIN D3) tablet 2,000 Units  2,000 Units Oral Daily Benjaman Kindler, MD   2,000 Units at 10/14/21 2246   docusate sodium (COLACE) capsule 100 mg  100 mg  Oral Daily Benjaman Kindler, MD   100 mg at 10/14/21 2116   ibuprofen (ADVIL) tablet 600 mg  600 mg Oral Q6H PRN Benjaman Kindler, MD       insulin aspart (novoLOG) injection 6 Units  6 Units Subcutaneous QHS Benjaman Kindler, MD   6 Units at 10/14/21 2124   insulin glargine-yfgn (SEMGLEE) injection 15 Units  15 Units Subcutaneous Daily Benjaman Kindler, MD   15 Units at 10/14/21 2253   labetalol (NORMODYNE) 5 MG/ML injection            lactated ringers infusion   Intravenous Continuous Benjaman Kindler, MD 125 mL/hr at 10/14/21 2120 New Bag at 10/14/21 2120   levothyroxine (SYNTHROID) tablet 75 mcg  75 mcg Oral QAC breakfast Benjaman Kindler, MD       lidocaine-prilocaine (EMLA) cream   Topical PRN Benjaman Kindler, MD   1 Application at 09/73/53 2245   menthol-cetylpyridinium (CEPACOL) lozenge 3 mg  1 lozenge Oral Q2H PRN Benjaman Kindler, MD       ondansetron [facility]) tablet 4 mg  4 mg Oral Q6H PRN Benjaman Kindler, MD       Or   ondansetron [facility]) injection 4 mg  4 mg Intravenous Q6H PRN Benjaman Kindler, MD       oxyCODONE (Oxy IR/ROXICODONE) immediate release tablet 5-10 mg  5-10 mg Oral Q4H PRN Benjaman Kindler, MD   5 mg at 10/14/21 2246   PARoxetine (PAXIL) tablet 20 mg  20 mg Oral  Lorri Frederick, MD   20 mg at 10/14/21 2117   rosuvastatin (CRESTOR) tablet 40 mg  40 mg Oral QHS Benjaman Kindler, MD   40 mg at 10/14/21 2117   simethicone (MYLICON) chewable tablet 80 mg  80 mg Oral QID PRN Benjaman Kindler, MD       terbinafine (LAMISIL) tablet 250 mg  250 mg Oral Daily Benjaman Kindler, MD   250 mg at 10/14/21 2116   torsemide (DEMADEX) tablet 10 mg  10 mg Oral [place] Lawman, MD   10 mg at 10/14/21 2116   white petrolatum (VASELINE) gel   Topical PRN Benjaman Kindler, MD   0.2 Application at 60/73/71 2245   Current Outpatient Medications on File Prior to Visit  Medication Sig Dispense Refill   acetaminophen (TYLENOL) 500 MG tablet Take 500 mg by mouth every 6 (six)  hours as needed for moderate pain.     buPROPion (WELLBUTRIN XL) 150 MG 24 hr tablet Take 3 tablets (450 mg total) by mouth at bedtime. 30 tablet 0   clobetasol ointment (TEMOVATE) 0.62 % Apply 1 application topically daily. vagina (Patient taking differently: Apply 1 application  topically 2 (two) times daily. vagina) 60 g 1   clopidogrel (PLAVIX) 75 MG tablet Take 1 tablet (75 mg total) by mouth at bedtime. 30 tablet 0   docusate sodium (COLACE) 100 MG capsule Take 1 capsule (100 mg total) by mouth daily. (Patient taking differently: Take 100 mg by mouth every other day.) 30 capsule 0   ferrous sulfate 325 (65 FE) MG tablet Take 1 tablet (325 mg total) by mouth daily with breakfast. (Patient taking differently: Take 325 mg by mouth daily.) 30 tablet 0   insulin glargine (LANTUS) 100 UNIT/ML injection Inject 0.15 mLs (15 Units total) into the skin at bedtime. (Patient taking differently: Inject 15 Units into the skin daily. morning) 10 mL 0   Insulin Syringe-Needle U-100 (INSULIN SYRINGE 1CC/31GX5/16") 31G X 5/16" 1 ML MISC USE ONE SYRINGE TWICE DAILY AS DIRECTED WITH INSULIN VIALS     levothyroxine (SYNTHROID) 75 MCG tablet Take 1 tablet (75 mcg total) by mouth daily before breakfast. 30 tablet 0   NOVOLOG 100 UNIT/ML injection Inject 6 Units into the skin in the morning and at bedtime. (Patient taking differently: Inject 4-6 Units into the skin in the morning and at bedtime. 4 units in the morning and 6 units at bedtime) 10 mL 0   nystatin cream (MYCOSTATIN) Apply 1 application topically as needed (yeast).     PARoxetine (PAXIL) 20 MG tablet Take 1 tablet (20 mg total) by mouth at bedtime. 30 tablet 0   rosuvastatin (CRESTOR) 40 MG tablet Take 1 tablet (40 mg total) by mouth at bedtime. 90 tablet 0   torsemide (DEMADEX) 10 MG tablet Take 1 tablet (10 mg total) by mouth every other day. 30 tablet 0    There are no Patient Instructions on file for this visit. No follow-ups on file.   Kris Hartmann, NP

## 2021-10-15 NOTE — Discharge Summary (Signed)
1 Day Post-Op       Procedure(s): DILATATION AND CURETTAGE /HYSTEROSCOPY (N/A) MYOSURE POLYPECTOMY LYSIS OF VAGINAL ADHESIONS Subjective: The patient is doing well.  No nausea or vomiting. Pain is adequately controlled. Overnight difficulty with urination due to pain at labial agglutination lysis of adhesions. Improved with emla cream  Objective: Vital signs in last 24 hours: Temp:  [97 F (36.1 C)-99.9 F (37.7 C)] 98.1 F (36.7 C) (09/29 0750) Pulse Rate:  [73-95] 73 (09/29 0750) Resp:  [10-20] 17 (09/29 0750) BP: (116-187)/(49-94) 116/49 (09/29 0750) SpO2:  [91 %-99 %] 98 % (09/29 0750)  Intake/Output  Intake/Output Summary (Last 24 hours) at 10/15/2021 1021 Last data filed at 10/15/2021 0411 Gross per 24 hour  Intake 1000 ml  Output 840 ml  Net 160 ml    Physical Exam:  General: Alert and oriented. CV: RRR Lungs: Clear bilaterally. GI: Soft, Nondistended. Incisions: Clean and dry. Urine: Clear, Extremities: Nontender, no erythema, no edema.  Lab Results: Recent Labs    10/14/21 1044 10/14/21 1942  HGB 12.3 12.3  HCT 38.2 37.9  WBC 2.9* 2.9*  PLT 124* 105*                 Results for orders placed or performed during the hospital encounter of 10/14/21 (from the past 24 hour(s))  CBC     Status: Abnormal   Collection Time: 10/14/21 10:44 AM  Result Value Ref Range   WBC 2.9 (L) 4.0 - 10.5 K/uL   RBC 4.21 3.87 - 5.11 MIL/uL   Hemoglobin 12.3 12.0 - 15.0 g/dL   HCT 38.2 36.0 - 46.0 %   MCV 90.7 80.0 - 100.0 fL   MCH 29.2 26.0 - 34.0 pg   MCHC 32.2 30.0 - 36.0 g/dL   RDW 13.2 11.5 - 15.5 %   Platelets 124 (L) 150 - 400 K/uL   nRBC 0.0 0.0 - 0.2 %  Basic metabolic panel     Status: Abnormal   Collection Time: 10/14/21 10:44 AM  Result Value Ref Range   Sodium 141 135 - 145 mmol/L   Potassium 5.1 3.5 - 5.1 mmol/L   Chloride 107 98 - 111 mmol/L   CO2 27 22 - 32 mmol/L   Glucose, Bld 128 (H) 70 - 99 mg/dL   BUN 28 (H) 8 - 23 mg/dL   Creatinine, Ser  1.27 (H) 0.44 - 1.00 mg/dL   Calcium 9.7 8.9 - 10.3 mg/dL   GFR, Estimated 44 (L) >60 mL/min   Anion gap 7 5 - 15  Type and screen [place]     Status: None   Collection Time: 10/14/21 10:44 AM  Result Value Ref Range   ABO/RH(D) O POS    Antibody Screen NEG    Sample Expiration      10/17/2021,2359 Performed at [facility], [address] [place], [place] [address]   Glucose, capillary     Status: None   Collection Time: 10/14/21  4:07 PM  Result Value Ref Range   Glucose-Capillary 87 70 - 99 mg/dL  CBC     Status: Abnormal   Collection Time: 10/14/21  7:42 PM  Result Value Ref Range   WBC 2.9 (L) 4.0 - 10.5 K/uL   RBC 4.09 3.87 - 5.11 MIL/uL   Hemoglobin 12.3 12.0 - 15.0 g/dL   HCT 37.9 36.0 - 46.0 %   MCV 92.7 80.0 - 100.0 fL   MCH 30.1 26.0 - 34.0 pg  MCHC 32.5 30.0 - 36.0 g/dL   RDW 13.1 11.5 - 15.5 %   Platelets 105 (L) 150 - 400 K/uL   nRBC 0.0 0.0 - 0.2 %  Basic metabolic panel     Status: Abnormal   Collection Time: 10/14/21  7:42 PM  Result Value Ref Range   Sodium 140 135 - 145 mmol/L   Potassium 5.2 (H) 3.5 - 5.1 mmol/L   Chloride 108 98 - 111 mmol/L   CO2 27 22 - 32 mmol/L   Glucose, Bld 199 (H) 70 - 99 mg/dL   BUN 24 (H) 8 - 23 mg/dL   Creatinine, Ser 1.10 (H) 0.44 - 1.00 mg/dL   Calcium 9.1 8.9 - 10.3 mg/dL   GFR, Estimated 52 (L) >60 mL/min   Anion gap 5 5 - 15  Glucose, capillary     Status: Abnormal   Collection Time: 10/14/21  8:27 PM  Result Value Ref Range   Glucose-Capillary 206 (H) 70 - 99 mg/dL  Basic metabolic panel     Status: Abnormal   Collection Time: 10/15/21  7:52 AM  Result Value Ref Range   Sodium 135 135 - 145 mmol/L   Potassium 5.1 3.5 - 5.1 mmol/L   Chloride 107 98 - 111 mmol/L   CO2 27 22 - 32 mmol/L   Glucose, Bld 142 (H) 70 - 99 mg/dL   BUN 25 (H) 8 - 23 mg/dL   Creatinine, Ser 1.29 (H) 0.44 - 1.00 mg/dL   Calcium 8.6 (L) 8.9 - 10.3 mg/dL   GFR, Estimated 43 (L) >60 mL/min   Anion  gap 1 (L) 5 - 15    Assessment/Plan: 1 Day Post-Op       Procedure(s): DILATATION AND CURETTAGE /HYSTEROSCOPY (N/A) MYOSURE POLYPECTOMY LYSIS OF VAGINAL ADHESIONS  -Ambulate, Incentive spirometry -Advance diet as tolerated -Discharge home today anticipated  Benjaman Kindler, MD   LOS: 0 days   Benjaman Kindler 10/15/2021, 10:21 AM

## 2021-10-15 NOTE — Anesthesia Postprocedure Evaluation (Signed)
Anesthesia Post Note  Patient: Abigail Walker  Procedure(s) Performed: DILATATION AND CURETTAGE /HYSTEROSCOPY ([place] ) MYOSURE POLYPECTOMY LYSIS OF VAGINAL ADHESIONS  Patient location during evaluation: PACU Anesthesia Type: General Level of consciousness: awake and alert, oriented and patient cooperative Pain management: pain level controlled Vital Signs Assessment: post-procedure vital signs reviewed and stable Respiratory status: spontaneous breathing, nonlabored ventilation and respiratory function stable Cardiovascular status: blood pressure returned to baseline and stable Postop Assessment: adequate PO intake Anesthetic complications: no   No notable events documented.   Last Vitals:  Vitals:   10/14/21 2323 10/15/21 0412  BP: (!) 177/60 (!) 124/50  Pulse: 80 83  Resp: 20 18  Temp: 36.9 C 36.8 C  SpO2: 98% 97%    Last Pain:  Vitals:   10/15/21 0412  TempSrc: Oral  PainSc:                  Darrin Nipper

## 2021-10-18 LAB — SURGICAL PATHOLOGY

## 2021-10-27 ENCOUNTER — Inpatient Hospital Stay: Payer: Medicare Other

## 2021-10-27 ENCOUNTER — Encounter: Payer: Self-pay | Admitting: Obstetrics and Gynecology

## 2021-10-27 ENCOUNTER — Inpatient Hospital Stay: Payer: Medicare Other | Attending: Obstetrics and Gynecology | Admitting: Obstetrics and Gynecology

## 2021-10-27 VITALS — BP 181/71 | HR 87 | Temp 97.8°F | Resp 20 | Wt 126.1 lb

## 2021-10-27 DIAGNOSIS — D472 Monoclonal gammopathy: Secondary | ICD-10-CM | POA: Insufficient documentation

## 2021-10-27 DIAGNOSIS — N183 Chronic kidney disease, stage 3 unspecified: Secondary | ICD-10-CM | POA: Diagnosis not present

## 2021-10-27 DIAGNOSIS — E039 Hypothyroidism, unspecified: Secondary | ICD-10-CM | POA: Diagnosis not present

## 2021-10-27 DIAGNOSIS — I129 Hypertensive chronic kidney disease with stage 1 through stage 4 chronic kidney disease, or unspecified chronic kidney disease: Secondary | ICD-10-CM | POA: Diagnosis not present

## 2021-10-27 DIAGNOSIS — Z87891 Personal history of nicotine dependence: Secondary | ICD-10-CM | POA: Insufficient documentation

## 2021-10-27 DIAGNOSIS — Z8701 Personal history of pneumonia (recurrent): Secondary | ICD-10-CM | POA: Insufficient documentation

## 2021-10-27 DIAGNOSIS — E1100 Type 2 diabetes mellitus with hyperosmolarity without nonketotic hyperglycemic-hyperosmolar coma (NKHHC): Secondary | ICD-10-CM

## 2021-10-27 DIAGNOSIS — C541 Malignant neoplasm of endometrium: Secondary | ICD-10-CM | POA: Diagnosis present

## 2021-10-27 DIAGNOSIS — G250 Essential tremor: Secondary | ICD-10-CM | POA: Insufficient documentation

## 2021-10-27 DIAGNOSIS — E1151 Type 2 diabetes mellitus with diabetic peripheral angiopathy without gangrene: Secondary | ICD-10-CM | POA: Diagnosis not present

## 2021-10-27 DIAGNOSIS — E785 Hyperlipidemia, unspecified: Secondary | ICD-10-CM | POA: Diagnosis not present

## 2021-10-27 DIAGNOSIS — Z79899 Other long term (current) drug therapy: Secondary | ICD-10-CM | POA: Insufficient documentation

## 2021-10-27 DIAGNOSIS — Z8673 Personal history of transient ischemic attack (TIA), and cerebral infarction without residual deficits: Secondary | ICD-10-CM | POA: Insufficient documentation

## 2021-10-27 DIAGNOSIS — Z7189 Other specified counseling: Secondary | ICD-10-CM

## 2021-10-27 DIAGNOSIS — G3184 Mild cognitive impairment, so stated: Secondary | ICD-10-CM | POA: Diagnosis not present

## 2021-10-27 DIAGNOSIS — I779 Disorder of arteries and arterioles, unspecified: Secondary | ICD-10-CM | POA: Insufficient documentation

## 2021-10-27 DIAGNOSIS — I447 Left bundle-branch block, unspecified: Secondary | ICD-10-CM | POA: Insufficient documentation

## 2021-10-27 DIAGNOSIS — Z794 Long term (current) use of insulin: Secondary | ICD-10-CM | POA: Diagnosis not present

## 2021-10-27 DIAGNOSIS — E1142 Type 2 diabetes mellitus with diabetic polyneuropathy: Secondary | ICD-10-CM | POA: Insufficient documentation

## 2021-10-27 DIAGNOSIS — Z79890 Hormone replacement therapy (postmenopausal): Secondary | ICD-10-CM | POA: Insufficient documentation

## 2021-10-27 DIAGNOSIS — H9190 Unspecified hearing loss, unspecified ear: Secondary | ICD-10-CM | POA: Insufficient documentation

## 2021-10-27 DIAGNOSIS — Z7901 Long term (current) use of anticoagulants: Secondary | ICD-10-CM | POA: Insufficient documentation

## 2021-10-27 DIAGNOSIS — F32A Depression, unspecified: Secondary | ICD-10-CM | POA: Diagnosis not present

## 2021-10-27 DIAGNOSIS — E119 Type 2 diabetes mellitus without complications: Secondary | ICD-10-CM

## 2021-10-27 DIAGNOSIS — E1122 Type 2 diabetes mellitus with diabetic chronic kidney disease: Secondary | ICD-10-CM | POA: Diagnosis not present

## 2021-10-27 LAB — CBC WITH DIFFERENTIAL/PLATELET
Abs Immature Granulocytes: 0.01 10*3/uL (ref 0.00–0.07)
Basophils Absolute: 0 10*3/uL (ref 0.0–0.1)
Basophils Relative: 2 %
Eosinophils Absolute: 0 10*3/uL (ref 0.0–0.5)
Eosinophils Relative: 2 %
HCT: 38.6 % (ref 36.0–46.0)
Hemoglobin: 12.7 g/dL (ref 12.0–15.0)
Immature Granulocytes: 0 %
Lymphocytes Relative: 32 %
Lymphs Abs: 0.9 10*3/uL (ref 0.7–4.0)
MCH: 30 pg (ref 26.0–34.0)
MCHC: 32.9 g/dL (ref 30.0–36.0)
MCV: 91.3 fL (ref 80.0–100.0)
Monocytes Absolute: 0.3 10*3/uL (ref 0.1–1.0)
Monocytes Relative: 11 %
Neutro Abs: 1.5 10*3/uL — ABNORMAL LOW (ref 1.7–7.7)
Neutrophils Relative %: 53 %
Platelets: 153 10*3/uL (ref 150–400)
RBC: 4.23 MIL/uL (ref 3.87–5.11)
RDW: 13.2 % (ref 11.5–15.5)
WBC: 2.7 10*3/uL — ABNORMAL LOW (ref 4.0–10.5)
nRBC: 0 % (ref 0.0–0.2)

## 2021-10-27 LAB — BASIC METABOLIC PANEL
Anion gap: 5 (ref 5–15)
BUN: 22 mg/dL (ref 8–23)
CO2: 29 mmol/L (ref 22–32)
Calcium: 9.3 mg/dL (ref 8.9–10.3)
Chloride: 105 mmol/L (ref 98–111)
Creatinine, Ser: 1.28 mg/dL — ABNORMAL HIGH (ref 0.44–1.00)
GFR, Estimated: 43 mL/min — ABNORMAL LOW (ref >60)
Glucose, Bld: 151 mg/dL — ABNORMAL HIGH (ref 70–99)
Potassium: 4.4 mmol/L (ref 3.5–5.1)
Sodium: 139 mmol/L (ref 135–145)

## 2021-10-27 LAB — HEMOGLOBIN A1C
Hgb A1c MFr Bld: 6.6 % — ABNORMAL HIGH (ref 4.8–5.6)
Mean Plasma Glucose: 142.72 mg/dL

## 2021-10-27 LAB — TSH: TSH: 0.061 u[IU]/mL — ABNORMAL LOW (ref 0.350–4.500)

## 2021-10-27 NOTE — Patient Instructions (Signed)
You have an appointment to see Cardiology, on Tuesday 10/17 at 2:00.

## 2021-10-27 NOTE — Progress Notes (Signed)
Gynecologic Oncology Consult Visit   Referring Provider: Dr. Leafy Ro  Chief Complaint: endometrial adenocarcinoma  Subjective:  Abigail Walker is a 76 y.o. female G1P1 who is seen in consultation from Dr. Leafy Ro for new diagnosis of endometrial adenocarcinoma.  Seen for annual exam in May 2022, mentioned PMB. Pap was unable to be collected due to lichen, loss of architecture, and stenosis. D&C and EUA was recommended. She returns 12/22 for surgical eval but did not show for ultrasound.   08/10/21- CT Angio Chest/Abd/Pelvis Uterus and adnexa were unremarkable. No lymphadenopathy. No ascites.  5 mm solid nodule in RML of lung.   September 2023 Re-presented and  underwent D&C, EUA, myosure polypectomy, lysis of vaginal adhesions with Dr. Leafy Ro 10/14/21.   DIAGNOSIS:  A. ENDOMETRIAL CURETTINGS:  - ENDOMETRIAL ADENOCARCINOMA, FIGO 1.  - CHRONIC ENDOMETRITIS   She has been using topical estrogen since surgery. She applies clobestasol and vaseline jelly on the external vulva.    History of occipital stroke in 2021, cognitive impairment, falls, major depression, Charlie bonnet syndrome/hallucinations, diabetes, MUI s/p percutaneous tibial nerve stimulation 2016-2017.  She also has multiple other medical problems as noted below.  Her blood sugars have improved per her report when her insulin was increased.  However she is able to tell us what her fasting blood sugars are.  Her last hemoglobin A1c was 7.  She also has a history of thyroid disease and last TSH 0.228 was low on 05/19/21.    VTE Risk: No known h/o VTE or family h/o VTE   Problem List: Patient Active Problem List   Diagnosis Date Noted   Status post surgery 10/14/2021   Aspiration pneumonia ([place]) 08/11/2021   CAP (community acquired pneumonia) 08/10/2021   Chest pain 08/10/2021   Dysphagia 08/10/2021   Type 2 diabetes mellitus with peripheral neuropathy ([place]) 08/10/2021   PAD (peripheral artery disease) ([place]) 02/22/2020    Hypoglycemia due to type 2 diabetes mellitus ([place])    Hypothermia    Dehydration    Hypoglycemia 01/09/2020   Moderate protein-calorie malnutrition ([place]) 01/08/2020   Fall    Hyponatremia    Hyperglycemia 12/05/2019   Acute lower UTI 12/05/2019   Depression    Lumbar compression fracture (HCC)    Weakness of left foot 09/14/2019   Hyperlipidemia 08/13/2019   Hypercalcemia 05/01/2019   Leukopenia 01/17/2019   Stage 3 chronic kidney disease ([place]) 01/17/2019   Benign hypertensive kidney disease with chronic kidney disease 01/16/2019   Hyposmolality and/or hyponatremia 01/16/2019   Secondary hyperparathyroidism of renal origin ([place]) 01/16/2019   Carotid artery disease ([place]) 08/10/2017   Chronic midline low back pain without sciatica 03/16/2017   Primary osteoarthritis of both hands 01/16/2017   Benign essential tremor 08/12/2015   MGUS (monoclonal gammopathy of unknown significance) 07/08/2015   Mixed incontinence 11/09/2014   Atrophic vaginitis 07/28/2014   Encounter for general adult medical examination without abnormal findings 05/28/2014   Major depression in remission ([place]) 11/27/2013   Morbid obesity ([place]) 11/16/2013   Microalbuminuria 08/06/2013   Breath shortness 08/02/2013   Acid reflux 06/16/2013   Benign hypertension 06/16/2013   Type 2 diabetes mellitus ([place]) 06/16/2013   Hypothyroidism 06/16/2013   Obstructive apnea 06/16/2013   Hyperlipidemia associated with type 2 diabetes mellitus ([place]) 34/19/6222   Oral lichen planus 97/98/9211   Lichen planus 94/17/4081    Past Medical History: Past Medical History:  Diagnosis Date   Adult hypothyroidism 06/16/2013   Anemia    Aortic atherosclerosis ([place])  Arthritis    Benign hypertension 06/16/2013   Carotid artery disease ([place])    a.) carotid US 02/10/2020: 1-39% BICA; b.) carotid US 09/22/2021: 1-39% BICA   Sherran Needs syndrome    CKD (chronic kidney disease), stage III (HCC)    Coronary artery calcification  seen on CT scan    Depression    Esophageal spasm    Esophageal stricture    GERD (gastroesophageal reflux disease)    HBV (hepatitis B virus) infection    High blood pressure    HLD (hyperlipidemia)    Hypothyroidism    LBBB (left bundle branch block)    Lichen planus    Long term current use of antithrombotics/antiplatelets    a.) clopidogrel   Mixed incontinence    OSA on CPAP 06/16/2013   Pneumonia 2023   RLS (restless legs syndrome)    Secondary hyperparathyroidism of renal origin ([place])    Skin cancer, basal cell    Sleep apnea    Stroke [facility])    Thyroid disease    Type 2 diabetes mellitus treated with insulin [facility])     Past Surgical History: Past Surgical History:  Procedure Laterality Date   CATARACT EXTRACTION Bilateral    COLONOSCOPY  2009   DILATATION & CURETTAGE/HYSTEROSCOPY WITH MYOSURE  10/14/2021   Procedure: MYOSURE POLYPECTOMY;  Surgeon: Benjaman Kindler, MD;  Location: [facility] ORS;  Service: Gynecology;;   ESOPHAGOGASTRODUODENOSCOPY     2009, 2011   ESOPHAGOGASTRODUODENOSCOPY (EGD) WITH PROPOFOL N/A 06/08/2015   Procedure: ESOPHAGOGASTRODUODENOSCOPY (EGD) WITH PROPOFOL;  Surgeon: Hulen Luster, MD;  Location: [facility] ENDOSCOPY;  Service: Gastroenterology;  Laterality: N/A;   HEMORRHOID SURGERY     HYSTEROSCOPY WITH D & C N/A 10/14/2021   Procedure: DILATATION AND CURETTAGE /HYSTEROSCOPY;  Surgeon: Benjaman Kindler, MD;  Location: [facility] ORS;  Service: Gynecology;  Laterality: N/A;   IMPLANTABLE CONTACT LENS IMPLANTATION     LYSIS OF ADHESION  10/14/2021   Procedure: LYSIS OF VAGINAL ADHESIONS;  Surgeon: Benjaman Kindler, MD;  Location: [facility] ORS;  Service: Gynecology;;   TONSILLECTOMY     TUBAL LIGATION      Past Gynecologic History:  Menarche: age 33 Post menopausal   OB History:  OB History  No obstetric history on file.  G1P1- vaginal delivery - her daughter has deceased  Family History: Family History  Problem Relation Age of Onset   Heart disease Father     Cancer Mother    Kidney failure Maternal Aunt     Social History: Social History   Socioeconomic History   Marital status: Single    Spouse name: Not on file   Number of children: Not on file   Years of education: Not on file   Highest education level: Not on file  Occupational History   Not on file  Tobacco Use   Smoking status: Former    Types: Cigarettes    Quit date: 07/27/1993    Years since quitting: 28.2   Smokeless tobacco: Never   Tobacco comments:    quit 20 years  Vaping Use   Vaping Use: Never used  Substance and Sexual Activity   Alcohol use: No    Alcohol/week: 0.0 standard drinks of alcohol   Drug use: No   Sexual activity: Not Currently    Birth control/protection: Post-menopausal  Other Topics Concern   Not on file  Social History Narrative   Lives alone   Social Determinants of Health   Financial Resource Strain: Not on file  Food  Insecurity: Not on file  Transportation Needs: Not on file  Physical Activity: Not on file  Stress: Not on file  Social Connections: Not on file  Intimate Partner Violence: Not on file    Allergies: Allergies  Allergen Reactions   Amoxicillin-Pot Clavulanate Rash   Metformin And Related Nausea And Vomiting   Sulfa Antibiotics Nausea And Vomiting   Hydrocortisone-Iodoquinol  [Hydrocortisone-Iodoquinol] Rash    Patient reports that she does not believe that she has allergy to this, that she was receiving many medications at once.    Current Medications: Current Outpatient Medications  Medication Sig Dispense Refill   acetaminophen (TYLENOL) 500 MG tablet Take 500 mg by mouth every 6 (six) hours as needed for moderate pain.     buPROPion (WELLBUTRIN XL) 150 MG 24 hr tablet Take 3 tablets (450 mg total) by mouth at bedtime. 30 tablet 0   Cholecalciferol (VITAMIN D3) 50 MCG (2000 UT) TABS Take 2 tablets by mouth daily.     clobetasol ointment (TEMOVATE) 1.61 % Apply 1 application topically daily. vagina (Patient  taking differently: Apply 1 application  topically 2 (two) times daily. vagina) 60 g 1   clopidogrel (PLAVIX) 75 MG tablet Take 1 tablet (75 mg total) by mouth at bedtime. 30 tablet 0   docusate sodium (COLACE) 100 MG capsule Take 1 capsule (100 mg total) by mouth daily. (Patient taking differently: Take 100 mg by mouth every other day.) 30 capsule 0   ferrous sulfate 325 (65 FE) MG tablet Take 1 tablet (325 mg total) by mouth daily with breakfast. (Patient taking differently: Take 325 mg by mouth daily.) 30 tablet 0   insulin glargine (LANTUS) 100 UNIT/ML injection Inject 0.15 mLs (15 Units total) into the skin at bedtime. (Patient taking differently: Inject 15 Units into the skin daily. morning) 10 mL 0   Insulin Syringe-Needle U-100 (INSULIN SYRINGE 1CC/31GX5/16") 31G X 5/16" 1 ML MISC USE ONE SYRINGE TWICE DAILY AS DIRECTED WITH INSULIN VIALS     levothyroxine (SYNTHROID) 75 MCG tablet Take 1 tablet (75 mcg total) by mouth daily before breakfast. 30 tablet 0   NOVOLOG 100 UNIT/ML injection Inject 6 Units into the skin in the morning and at bedtime. (Patient taking differently: Inject 4-6 Units into the skin in the morning and at bedtime. 4 units in the morning and 6 units at bedtime) 10 mL 0   nystatin cream (MYCOSTATIN) Apply 1 application topically as needed (yeast).     PARoxetine (PAXIL) 20 MG tablet Take 1 tablet (20 mg total) by mouth at bedtime. 30 tablet 0   rosuvastatin (CRESTOR) 40 MG tablet Take 1 tablet (40 mg total) by mouth at bedtime. 90 tablet 0   terbinafine (LAMISIL) 250 MG tablet Take 250 mg by mouth daily.     torsemide (DEMADEX) 10 MG tablet Take 1 tablet (10 mg total) by mouth every other day. 30 tablet 0   lidocaine-prilocaine (EMLA) cream Apply 1 Application topically as needed. To your vulva for pain control (Patient not taking: Reported on 10/27/2021) 30 g 0   No current facility-administered medications for this visit.    General: negative for fevers, changes in  weight or night sweats Skin: negative for changes in moles or sores or rash Eyes: negative for changes in vision HEENT: positive for tinnitus/charlie bonnet syndrome Pulmonary: positive for cough Cardiac: negative for palpitations, pain Gastrointestinal: negative for nausea, vomiting, diarrhea, hematemesis, hematochezia. Positive for constipation Genitourinary/Sexual: negative for dysuria, retention, hematuria positive for incontinence, dysuria Ob/Gyn:  negative for abnormal bleeding, or pain. Dark brown discharge which she feels is related to irritation d/t lichens Musculoskeletal: negative for pain, joint pain, back pain Hematology: negative for easy bruising, abnormal bleeding Neurologic/Psych: Positive for headaches, numbness, weakness  Objective:  Physical Examination:  BP (!) 181/71   Pulse 87   Temp 97.8 F (36.6 C)   Resp 20   Wt 126 lb 1.6 oz (57.2 kg)   SpO2 100%   BMI 23.83 kg/m     ECOG Performance Status: 2 - Symptomatic, <50% confined to bed  GENERAL: Patient is a very frail appearing female in no acute distress HEENT:  Sclera clear. Anicteric. Hard of hearing.  NODES:  Negative axillary, supraclavicular, inguinal lymph node survery LUNGS:  Clear to auscultation bilaterally.   HEART:  Regular rate and rhythm.  ABDOMEN:  Soft, nontender.  No hernias, incisions well healed. No masses or ascites EXTREMITIES:  No peripheral edema. Atraumatic. No cyanosis SKIN:  Clear with no obvious rashes or skin changes.  NEURO:  Nonfocal. Well oriented.  Appropriate affect.  Pelvic: Exam chaperoned by Nursing EGBUS: mild erythema and no lesions Cervix: only able to see anterior aspect due to very narrow vaginal vault. On BME nontender, no palpable lesions and mobile Vagina: blood 20 cc present; very narrow. Swabbed with Fox swab and no evidence of active bleeding Uterus: normal size, nontender, mobile Adnexa: no palpable masses Rectovaginal: deferred  Lab Review Labs on site  today: CBC, CMP, TSH, HbA1c ordered.   Lab Results  Component Value Date   WBC 2.7 (L) 10/27/2021   HGB 12.7 10/27/2021   HCT 38.6 10/27/2021   MCV 91.3 10/27/2021   PLT 153 10/27/2021     Chemistry      Component Value Date/Time   NA 135 10/15/2021 0752   NA 140 05/19/2011 0409   K 5.1 10/15/2021 0752   K 4.2 05/19/2011 0409   CL 107 10/15/2021 0752   CL 105 05/19/2011 0409   CO2 27 10/15/2021 0752   CO2 28 05/19/2011 0409   BUN 25 (H) 10/15/2021 0752   BUN 15 05/19/2011 0409   CREATININE 1.29 (H) 10/15/2021 0752   CREATININE 1.18 05/19/2011 0409      Component Value Date/Time   CALCIUM 8.6 (L) 10/15/2021 0752   CALCIUM 9.2 05/19/2011 0409   ALKPHOS 73 08/10/2021 1001   ALKPHOS 88 05/14/2011 1432   AST 28 08/10/2021 1001   AST 20 05/14/2011 1432   ALT 18 08/10/2021 1001   ALT 20 05/14/2011 1432   BILITOT 0.9 08/10/2021 1001   BILITOT 0.3 05/14/2011 1432     EKG 10/18/2021 unchanged from prior; NSR with left bundle branch block, left axis deviation  Radiologic Imaging: 08/10/2021 CLINICAL DATA:  Chest pain   EXAM: CT ANGIOGRAPHY CHEST, ABDOMEN AND PELVIS   TECHNIQUE: Non-contrast CT of the chest was initially obtained.   Multidetector CT imaging through the chest, abdomen and pelvis was performed using the standard protocol during bolus administration of intravenous contrast. Multiplanar reconstructed images and MIPs were obtained and reviewed to evaluate the vascular anatomy.   RADIATION DOSE REDUCTION: This exam was performed according to the departmental dose-optimization program which includes automated exposure control, adjustment of the mA and/or kV according to patient size and/or use of iterative reconstruction technique.   CONTRAST:  24m OMNIPAQUE IOHEXOL 350 MG/ML SOLN   COMPARISON:  Same day chest radiograph, CT abdomen/pelvis 04/16/2007, thoracic spine and lumbar spine CT 12/05/2019   FINDINGS: CTA CHEST FINDINGS  Cardiovascular: There  is no evidence of acute intramural hematoma on the initial noncontrast study. There is no evidence of dissection or aneurysm on the postcontrast study. There is no evidence of pulmonary embolism. The heart size is normal. There is no pericardial effusion. There are coronary artery calcifications, aortic valve calcifications, and calcified atherosclerotic plaque in the thoracic aorta.   Mediastinum/Nodes: Esophagus is grossly unremarkable. There is no mediastinal, hilar, or axillary lymphadenopathy.   Lungs/Pleura: The trachea and central airways are patent. Are numerous small foci of ground-glass opacity in the right middle lobe and a single focus of ground-glass opacity in the right lower lobe. There is a 5 mm solid nodule in the lateral segment of the right middle lobe (6-90).   There is a 4 mm solid nodule in the left lower lobe (6-102). This nodule was present in 2009. There is a 3-4 mm nodule more inferolaterally in the left base, also present in 2009 (6-109). These nodules can be considered benign.   Musculoskeletal: There is no acute osseous abnormality or suspicious osseous lesion. There is indentation of the inferior T12 endplate by a prominent Schmorl's node, similar to the prior thoracic spine CT from 2021. Other thoracic vertebral body heights are preserved.   Review of the MIP images confirms the above findings.   CTA ABDOMEN AND PELVIS FINDINGS   VASCULAR   Aorta: Normal caliber aorta without aneurysm, dissection, or vasculitis. There is circumferential calcified atherosclerotic plaque without significant stenosis.   Celiac: Patent without evidence of aneurysm, dissection, vasculitis or significant stenosis.   SMA: Patent without evidence of aneurysm, dissection, or vasculitis. There is calcified plaque at the origin resulting in mild stenosis.   Renals: Both renal arteries are patent without evidence of aneurysm, dissection, vasculitis, or fibromuscular  dysplasia. There is calcified plaque at the origins resulting in at least moderate stenosis on the left and mild stenosis on the right.   IMA: Patent without evidence of aneurysm, dissection, vasculitis or significant stenosis.   Inflow: Patent without evidence of aneurysm, dissection, vasculitis or significant stenosis. Scattered calcified plaque.   Veins: No obvious venous abnormality within the limitations of this arterial phase study.   Review of the MIP images confirms the above findings.   NON-VASCULAR   Hepatobiliary: The liver and gallbladder are unremarkable. There is no biliary ductal dilatation.   Pancreas: Unremarkable.   Spleen: Unremarkable.   Adrenals/Urinary Tract: Adrenals are unremarkable.   The kidneys are unremarkable, with no focal lesion, stone, hydronephrosis, or hydroureter. The bladder is unremarkable.   Stomach/Bowel: Stomach is unremarkable there is no evidence of bowel obstruction. There is no abnormal bowel wall thickening or inflammatory change. There are scattered colonic diverticuli without evidence of acute diverticulitis.   Lymphatic: There is no abdominopelvic lymphadenopathy.   Reproductive: The uterus and adnexa are unremarkable.   Other: There is no ascites or free air.   Musculoskeletal: Compression deformity of the L1 vertebral body is similar to the CT from 2021. There is indentation of the inferior L2 endplate by a prominent Schmorl's node, also unchanged. There is no acute osseous abnormality or suspicious osseous lesion.   Review of the MIP images confirms the above findings.   IMPRESSION: 1. No evidence of aortic dissection or aneurysm. 2. Scattered ground-glass opacities in the right middle lobe are favored infectious or inflammatory in etiology. Recommend follow-up chest CT in 3 months to ensure resolution. The 5 mm solid nodule in the right middle lobe can also be reassessed at this  time. 3. No acute pathology in the  abdomen or pelvis. 4. Scattered colonic diverticuli without evidence of acute diverticulitis. 5.  Aortic Atherosclerosis (ICD10-I70.0).      Assessment:  Abigail Walker is a 76 y.o. female diagnosed with endometrioid endometrial cancer grade 1.   Plavix  Medical co-morbidities complicating care: Aspiration pneumonia/CAP (community acquired pneumonia); significant dysphagia; Type 2 diabetes mellitus with peripheral neuropathy; PAD (peripheral artery disease)/Carotid artery disease; Moderate protein-calorie malnutrition; Stage 3 chronic kidney disease; Benign hypertensive kidney disease with chronic kidney disease; MGUS (monoclonal gammopathy of unknown significance); Hypothyroidism; Obstructive apnea (Dx'd 06/16/2013 now resolved); and left bundle branch block with left axis deviation Plan:   Problem List Items Addressed This Visit       Endocrine   Type 2 diabetes mellitus ([place])   Relevant Orders   TSH   Hemoglobin A1c   Other Visit Diagnoses     Endometrial adenocarcinoma ([place])    -  Primary   Relevant Orders   Basic metabolic panel   CBC with Differential/Platelet (Completed)   Other specified counseling          A long discussion was held with the patient today about her endometrioid carcinoma of the endometrium.  We discussed the different treatment options including hormonal therapy radiation and surgery.  We reviewed the advantages and disadvantages of each approach.  We also discussed potential cure rates.  She is very interested in surgical management.  We discussed the fact that she has multiple medical issues and that she would need to have surgical clearance before consideration for surgery.  She also understands that even with surgery she may require further treatment with chemotherapy and/or radiation postoperatively.  If she is cleared for surgery, we recommend that she undergo surgical treatment with hysterectomy and bilateral salpingoophorectomy via minimally  invasive laparoscopic/robotic surgery.  Plan robotic hysterectomy, bilateral salpingo-oophorectomy, SLN injection/mapping/biopsy, possible pelvic and para-aortic node dissection.   She will need to hold her Plavix prior to surgery.  The risks of surgery were discussed in detail and she understands these to include infection; wound separation; hernia; vaginal cuff separation, injury to adjacent organs such as bowel, bladder, blood vessels, ureters and nerves; bleeding which may require blood transfusion; anesthesia risk; thromboembolic events; possible death; unforeseen complications; possible need for re-exploration; medical complications such as heart attack, stroke, pleural effusion and pneumonia; and, if staging performed the risk of lymphedema and lymphocyst; allergic reaction.  The patient will receive DVT and antibiotic prophylaxis as indicated.  She voiced a clear understanding.  She had the opportunity to ask questions and written informed consent was obtained today.  Given her multiple medical issues she will have surgery at [facility] with postoperative follow-up at [facility].  Suggested return to clinic in  4-6 weeks after surgery pending pathology results.    The patient's diagnosis, an outline of the further diagnostic and laboratory studies which will be required, the recommendation for surgery, and alternatives were discussed with her and her accompanying family members.  All questions were answered to their satisfaction.  I personally had a face to face interaction and evaluated the patient jointly with the NP, Ms. Beckey Rutter.  I have reviewed her history and available records and have performed the key portions of the physical exam including  lymph node survey, abdominal exam, pelvic exam with my findings confirming those documented above by the APP.  I have discussed the case with the APP and the patient.  I agree with the  above documentation, assessment and plan  which was fully formulated by me.  Counseling was completed by me.   I personally saw the patient and performed a substantive portion of this encounter in conjunction with the listed APP as documented above.  Wiatt Mahabir Gaetana Michaelis, MD       CC:  Benjaman Kindler, [address],  [place] [id number] [phone]

## 2021-11-19 ENCOUNTER — Ambulatory Visit: Payer: Medicare Other | Admitting: Anesthesiology

## 2021-11-19 ENCOUNTER — Ambulatory Visit
Admission: RE | Admit: 2021-11-19 | Discharge: 2021-11-19 | Disposition: A | Discharge status: Home / Self Care | Payer: Medicare Other | Attending: Gastroenterology | Admitting: Gastroenterology

## 2021-11-19 ENCOUNTER — Encounter: Payer: Self-pay | Admitting: *Deleted

## 2021-11-19 ENCOUNTER — Other Ambulatory Visit: Payer: Self-pay

## 2021-11-19 ENCOUNTER — Encounter
Admission: RE | Disposition: A | Discharge status: Home / Self Care | Payer: Self-pay | Source: Home / Self Care | Attending: Gastroenterology

## 2021-11-19 DIAGNOSIS — D649 Anemia, unspecified: Secondary | ICD-10-CM | POA: Diagnosis not present

## 2021-11-19 DIAGNOSIS — D759 Disease of blood and blood-forming organs, unspecified: Secondary | ICD-10-CM | POA: Diagnosis not present

## 2021-11-19 DIAGNOSIS — Z1211 Encounter for screening for malignant neoplasm of colon: Secondary | ICD-10-CM | POA: Diagnosis present

## 2021-11-19 DIAGNOSIS — R1310 Dysphagia, unspecified: Secondary | ICD-10-CM | POA: Insufficient documentation

## 2021-11-19 DIAGNOSIS — Z7902 Long term (current) use of antithrombotics/antiplatelets: Secondary | ICD-10-CM | POA: Diagnosis not present

## 2021-11-19 DIAGNOSIS — Z8673 Personal history of transient ischemic attack (TIA), and cerebral infarction without residual deficits: Secondary | ICD-10-CM | POA: Insufficient documentation

## 2021-11-19 DIAGNOSIS — I2510 Atherosclerotic heart disease of native coronary artery without angina pectoris: Secondary | ICD-10-CM | POA: Insufficient documentation

## 2021-11-19 DIAGNOSIS — K449 Diaphragmatic hernia without obstruction or gangrene: Secondary | ICD-10-CM | POA: Diagnosis not present

## 2021-11-19 DIAGNOSIS — K640 First degree hemorrhoids: Secondary | ICD-10-CM | POA: Insufficient documentation

## 2021-11-19 DIAGNOSIS — E785 Hyperlipidemia, unspecified: Secondary | ICD-10-CM | POA: Insufficient documentation

## 2021-11-19 DIAGNOSIS — K5730 Diverticulosis of large intestine without perforation or abscess without bleeding: Secondary | ICD-10-CM | POA: Diagnosis not present

## 2021-11-19 DIAGNOSIS — E1122 Type 2 diabetes mellitus with diabetic chronic kidney disease: Secondary | ICD-10-CM | POA: Insufficient documentation

## 2021-11-19 DIAGNOSIS — N183 Chronic kidney disease, stage 3 unspecified: Secondary | ICD-10-CM | POA: Diagnosis not present

## 2021-11-19 DIAGNOSIS — I129 Hypertensive chronic kidney disease with stage 1 through stage 4 chronic kidney disease, or unspecified chronic kidney disease: Secondary | ICD-10-CM | POA: Diagnosis not present

## 2021-11-19 HISTORY — PX: ESOPHAGOGASTRODUODENOSCOPY (EGD) WITH PROPOFOL: SHX5813

## 2021-11-19 HISTORY — PX: COLONOSCOPY WITH PROPOFOL: SHX5780

## 2021-11-19 LAB — GLUCOSE, CAPILLARY: Glucose-Capillary: 96 mg/dL (ref 70–99)

## 2021-11-19 SURGERY — COLONOSCOPY WITH PROPOFOL
Anesthesia: General

## 2021-11-19 MED ORDER — FENTANYL CITRATE (PF) 100 MCG/2ML IJ SOLN
INTRAMUSCULAR | Status: AC
  Filled 2021-11-19: qty 2

## 2021-11-19 MED ORDER — MIDAZOLAM HCL 2 MG/2ML IJ SOLN
INTRAMUSCULAR | Status: AC
  Filled 2021-11-19: qty 2

## 2021-11-19 MED ORDER — PROPOFOL 10 MG/ML IV BOLUS
INTRAVENOUS | Status: DC | PRN
  Administered 2021-11-19: 50 mg via INTRAVENOUS

## 2021-11-19 MED ORDER — PROPOFOL 500 MG/50ML IV EMUL
INTRAVENOUS | Status: DC | PRN
  Administered 2021-11-19: 100 ug/kg/min via INTRAVENOUS

## 2021-11-19 MED ORDER — FENTANYL CITRATE (PF) 100 MCG/2ML IJ SOLN
INTRAMUSCULAR | Status: DC | PRN
  Administered 2021-11-19 (×3): 25 ug via INTRAVENOUS

## 2021-11-19 MED ORDER — SODIUM CHLORIDE 0.9 % IV SOLN: INTRAVENOUS | Status: DC

## 2021-11-19 MED ORDER — PHENYLEPHRINE HCL-NACL 20-0.9 MG/250ML-% IV SOLN
INTRAVENOUS | Status: AC
  Filled 2021-11-19: qty 250

## 2021-11-19 MED ORDER — DEXMEDETOMIDINE HCL IN NACL 80 MCG/20ML IV SOLN
INTRAVENOUS | Status: DC | PRN
  Administered 2021-11-19: 4 ug via BUCCAL

## 2021-11-19 MED ORDER — LABETALOL HCL 5 MG/ML IV SOLN
INTRAVENOUS | Status: DC | PRN
  Administered 2021-11-19: 5 mg via INTRAVENOUS

## 2021-11-19 MED ORDER — PROPOFOL 1000 MG/100ML IV EMUL
INTRAVENOUS | Status: AC
  Filled 2021-11-19: qty 100

## 2021-11-19 NOTE — Anesthesia Procedure Notes (Signed)
Date/Time: 11/19/2021 8:05 AM  Performed by: Lorie Apley, CRNAPre-anesthesia Checklist: Patient identified, Emergency Drugs available, Suction available, Patient being monitored and Timeout performed Patient Re-evaluated:Patient Re-evaluated prior to induction Oxygen Delivery Method: Nasal cannula Preoxygenation: Pre-oxygenation with 100% oxygen Induction Type: IV induction

## 2021-11-19 NOTE — Anesthesia Preprocedure Evaluation (Signed)
Anesthesia Evaluation  Patient identified by MRN, date of birth, ID band Patient awake    Reviewed: Allergy & Precautions, NPO status , Patient's Chart, lab work & pertinent test results  Airway Mallampati: III  TM Distance: >3 FB Neck ROM: full    Dental  (+) Edentulous Upper, Edentulous Lower   Pulmonary neg pulmonary ROS, sleep apnea , former smoker   Pulmonary exam normal        Cardiovascular Exercise Tolerance: Good hypertension, + CAD and + Peripheral Vascular Disease  negative cardio ROS Normal cardiovascular exam+ dysrhythmias  Rhythm:Regular Rate:Normal     Neuro/Psych    Depression    CVA negative neurological ROS  negative psych ROS   GI/Hepatic negative GI ROS, Neg liver ROS,GERD  Medicated,,  Endo/Other  negative endocrine ROSdiabetes, Well Controlled, Type 1Hypothyroidism    Renal/GU negative Renal ROS  negative genitourinary   Musculoskeletal  (+) Arthritis ,    Abdominal  (+) + scaphoid   Peds negative pediatric ROS (+)  Hematology negative hematology ROS (+) Blood dyscrasia, anemia   Anesthesia Other Findings Past Medical History: 06/16/2013: Adult hypothyroidism No date: Anemia No date: Aortic atherosclerosis (HCC) No date: Arthritis 06/16/2013: Benign hypertension No date: Carotid artery disease ([place])     Comment:  a.) carotid US 02/10/2020: 1-39% BICA; b.) carotid US               09/22/2021: 1-39% BICA No date: Sherran Needs syndrome No date: CKD (chronic kidney disease), stage III (HCC) No date: Coronary artery calcification seen on CT scan No date: Depression No date: Esophageal spasm No date: Esophageal stricture No date: GERD (gastroesophageal reflux disease) No date: HBV (hepatitis B virus) infection No date: High blood pressure No date: HLD (hyperlipidemia) No date: Hypothyroidism No date: LBBB (left bundle branch block) No date: Lichen planus No date: Long term current  use of antithrombotics/antiplatelets     Comment:  a.) clopidogrel No date: Mixed incontinence 06/16/2013: OSA on CPAP 2023: Pneumonia No date: RLS (restless legs syndrome) No date: Secondary hyperparathyroidism of renal origin ([place]) No date: Skin cancer, basal cell No date: Sleep apnea No date: Stroke [facility]) No date: Thyroid disease No date: Type 2 diabetes mellitus treated with insulin ([place])  Past Surgical History: No date: CATARACT EXTRACTION; Bilateral 2009: COLONOSCOPY 10/14/2021: DILATATION & CURETTAGE/HYSTEROSCOPY WITH MYOSURE     Comment:  Procedure: MYOSURE POLYPECTOMY;  Surgeon: Benjaman Kindler, MD;  Location: [facility] ORS;  Service: Gynecology;; No date: ESOPHAGOGASTRODUODENOSCOPY     Comment:  2009, 2011 06/08/2015: ESOPHAGOGASTRODUODENOSCOPY (EGD) WITH PROPOFOL; N/A     Comment:  Procedure: ESOPHAGOGASTRODUODENOSCOPY (EGD) WITH               PROPOFOL;  Surgeon: Hulen Luster, MD;  Location: [facility]               ENDOSCOPY;  Service: Gastroenterology;  Laterality: N/A; No date: HEMORRHOID SURGERY 10/14/2021: HYSTEROSCOPY WITH D & C; N/A     Comment:  Procedure: DILATATION AND CURETTAGE /HYSTEROSCOPY;                Surgeon: Benjaman Kindler, MD;  Location: [facility] ORS;                Service: Gynecology;  Laterality: N/A; No date: IMPLANTABLE CONTACT LENS IMPLANTATION 10/14/2021: LYSIS OF ADHESION     Comment:  Procedure: LYSIS OF VAGINAL ADHESIONS;  Surgeon:  Benjaman Kindler, MD;  Location: [facility] ORS;  Service:               Gynecology;; No date: TONSILLECTOMY No date: TUBAL LIGATION     Reproductive/Obstetrics negative OB ROS                             Anesthesia Physical Anesthesia Plan  ASA: 3  Anesthesia Plan: General   Post-op Pain Management:    Induction:   PONV Risk Score and Plan: Propofol infusion and TIVA  Airway Management Planned: Natural Airway  Additional Equipment:   Intra-op Plan:    Post-operative Plan:   Informed Consent: I have reviewed the patients History and Physical, chart, labs and discussed the procedure including the risks, benefits and alternatives for the proposed anesthesia with the patient or authorized representative who has indicated his/her understanding and acceptance.     Dental Advisory Given  Plan Discussed with: CRNA and Surgeon  Anesthesia Plan Comments:        Anesthesia Quick Evaluation

## 2021-11-19 NOTE — H&P (Signed)
Outpatient short stay form Pre-procedure 11/19/2021  Abigail Rubenstein, MD  Primary Physician: Kirk Ruths, MD  Reason for visit:  Dysphagia/Colon cancer screening  History of present illness:    76 y/o lady with history of CVA, DM II, and HLD here for EGD/Colonoscopy for dysphagia and colon cancer screening. Takes plavix with last dose 7 days ago. No family history of GI malignancies. No significant abdominal surgeries.   Current Facility-Administered Medications:    0.9 %  sodium chloride infusion, , Intravenous, Continuous, Amoni Morales, Hilton Cork, MD, Last Rate: 20 mL/hr at 11/19/21 0741, New Bag at 11/19/21 0741  Medications Prior to Admission  Medication Sig Dispense Refill Last Dose   buPROPion (WELLBUTRIN XL) 150 MG 24 hr tablet Take 3 tablets (450 mg total) by mouth at bedtime. 30 tablet 0 Past Week   Cholecalciferol (VITAMIN D3) 50 MCG (2000 UT) TABS Take 2 tablets by mouth daily.   Past Week   docusate sodium (COLACE) 100 MG capsule Take 1 capsule (100 mg total) by mouth daily. (Patient taking differently: Take 100 mg by mouth every other day.) 30 capsule 0 Past Week   ferrous sulfate 325 (65 FE) MG tablet Take 1 tablet (325 mg total) by mouth daily with breakfast. (Patient taking differently: Take 325 mg by mouth daily.) 30 tablet 0 Past Week   PARoxetine (PAXIL) 20 MG tablet Take 1 tablet (20 mg total) by mouth at bedtime. 30 tablet 0 Past Week   acetaminophen (TYLENOL) 500 MG tablet Take 500 mg by mouth every 6 (six) hours as needed for moderate pain.      clobetasol ointment (TEMOVATE) 9.37 % Apply 1 application topically daily. vagina (Patient taking differently: Apply 1 application  topically 2 (two) times daily. vagina) 60 g 1    clopidogrel (PLAVIX) 75 MG tablet Take 1 tablet (75 mg total) by mouth at bedtime. 30 tablet 0 11/10/2021   insulin glargine (LANTUS) 100 UNIT/ML injection Inject 0.15 mLs (15 Units total) into the skin at bedtime. (Patient taking  differently: Inject 15 Units into the skin daily. morning) 10 mL 0 11/17/2021   Insulin Syringe-Needle U-100 (INSULIN SYRINGE 1CC/31GX5/16") 31G X 5/16" 1 ML MISC USE ONE SYRINGE TWICE DAILY AS DIRECTED WITH INSULIN VIALS      levothyroxine (SYNTHROID) 75 MCG tablet Take 1 tablet (75 mcg total) by mouth daily before breakfast. 30 tablet 0 11/17/2021   lidocaine-prilocaine (EMLA) cream Apply 1 Application topically as needed. To your vulva for pain control (Patient not taking: Reported on 10/27/2021) 30 g 0    NOVOLOG 100 UNIT/ML injection Inject 6 Units into the skin in the morning and at bedtime. (Patient taking differently: Inject 4-6 Units into the skin in the morning and at bedtime. 4 units in the morning and 6 units at bedtime) 10 mL 0 11/17/2021   nystatin cream (MYCOSTATIN) Apply 1 application topically as needed (yeast).      rosuvastatin (CRESTOR) 40 MG tablet Take 1 tablet (40 mg total) by mouth at bedtime. 90 tablet 0    terbinafine (LAMISIL) 250 MG tablet Take 250 mg by mouth daily.      torsemide (DEMADEX) 10 MG tablet Take 1 tablet (10 mg total) by mouth every other day. 30 tablet 0 11/17/2021     Allergies  Allergen Reactions   Amoxicillin-Pot Clavulanate Rash   Metformin And Related Nausea And Vomiting   Sulfa Antibiotics Nausea And Vomiting   Hydrocortisone-Iodoquinol  [Hydrocortisone-Iodoquinol] Rash    Patient reports that she does  not believe that she has allergy to this, that she was receiving many medications at once.     Past Medical History:  Diagnosis Date   Adult hypothyroidism 06/16/2013   Anemia    Aortic atherosclerosis (HCC)    Arthritis    Benign hypertension 06/16/2013   Carotid artery disease ([place])    a.) carotid US 02/10/2020: 1-39% BICA; b.) carotid US 09/22/2021: 1-39% BICA   Sherran Needs syndrome    CKD (chronic kidney disease), stage III (HCC)    Coronary artery calcification seen on CT scan    Depression    Esophageal spasm    Esophageal  stricture    GERD (gastroesophageal reflux disease)    HBV (hepatitis B virus) infection    High blood pressure    HLD (hyperlipidemia)    Hypothyroidism    LBBB (left bundle branch block)    Lichen planus    Long term current use of antithrombotics/antiplatelets    a.) clopidogrel   Mixed incontinence    OSA on CPAP 06/16/2013   Pneumonia 2023   RLS (restless legs syndrome)    Secondary hyperparathyroidism of renal origin ([place])    Skin cancer, basal cell    Sleep apnea    Stroke [facility])    Thyroid disease    Type 2 diabetes mellitus treated with insulin ([place])     Review of systems:  Otherwise negative.    Physical Exam  Gen: Alert, oriented. Appears stated age.  HEENT: PERRLA. Lungs: No respiratory distress CV: RRR Abd: soft, benign, no masses Ext: No edema    Planned procedures: Proceed with EGD/colonoscopy. The patient understands the nature of the planned procedure, indications, risks, alternatives and potential complications including but not limited to bleeding, infection, perforation, damage to internal organs and possible oversedation/side effects from anesthesia. The patient agrees and gives consent to proceed.  Please refer to procedure notes for findings, recommendations and patient disposition/instructions.     Abigail Rubenstein, MD [facility]

## 2021-11-19 NOTE — Anesthesia Postprocedure Evaluation (Signed)
Anesthesia Post Note  Patient: Abigail Walker  Procedure(s) Performed: COLONOSCOPY WITH PROPOFOL ESOPHAGOGASTRODUODENOSCOPY (EGD) WITH PROPOFOL  Patient location during evaluation: PACU Anesthesia Type: General Level of consciousness: awake and awake and alert Pain management: satisfactory to patient Vital Signs Assessment: post-procedure vital signs reviewed and stable Respiratory status: nonlabored ventilation Cardiovascular status: stable Anesthetic complications: no  No notable events documented.   Last Vitals:  Vitals:   11/19/21 0846 11/19/21 0856  BP: (!) 109/45 114/64  Pulse: 72   Resp: 15 19  Temp:    SpO2: 100%     Last Pain:  Vitals:   11/19/21 0856  TempSrc:   PainSc: 0-No pain                 VAN STAVEREN,Jere Bostrom

## 2021-11-19 NOTE — Transfer of Care (Signed)
Immediate Anesthesia Transfer of Care Note  Patient: Abigail Walker  Procedure(s) Performed: COLONOSCOPY WITH PROPOFOL ESOPHAGOGASTRODUODENOSCOPY (EGD) WITH PROPOFOL  Patient Location: PACU  Anesthesia Type:General  Level of Consciousness: sedated and drowsy  Airway & Oxygen Therapy: Patient Spontanous Breathing  Post-op Assessment: Report given to RN and Post -op Vital signs reviewed and stable  Post vital signs: Reviewed and stable  Last Vitals:  Vitals Value Taken Time  BP    Temp    Pulse    Resp    SpO2      Last Pain:  Vitals:   11/19/21 0732  TempSrc: Axillary  PainSc: 0-No pain         Complications: No notable events documented.

## 2021-11-19 NOTE — Op Note (Signed)
[facility] Patient Name: Abigail Walker Procedure Date: 11/19/2021 7:22 AM MRN: [id number] Account #: [id number] Date of Birth: [birth year] Admit Type: Outpatient Age: 76 Room: [facility] ENDO ROOM 3 Gender: Female Note Status: Finalized Instrument Name: Altamese Cabal Endoscope [id number] Procedure:             Upper GI endoscopy Indications:           Dysphagia Providers:             Andrey Farmer MD, MD Medicines:             Monitored Anesthesia Care Complications:         No immediate complications. Estimated blood loss:                         Minimal. Procedure:             Pre-Anesthesia Assessment:                        - Prior to the procedure, a History and Physical was                         performed, and patient medications and allergies were                         reviewed. The patient is competent. The risks and                         benefits of the procedure and the sedation options and                         risks were discussed with the patient. All questions                         were answered and informed consent was obtained.                         Patient identification and proposed procedure were                         verified by the physician, the nurse, the                         anesthesiologist, the anesthetist and the technician                         in the endoscopy suite. Mental Status Examination:                         alert and oriented. Airway Examination: normal                         oropharyngeal airway and neck mobility. Respiratory                         Examination: clear to auscultation. CV Examination:                         normal. Prophylactic Antibiotics: The patient does not  require prophylactic antibiotics. Prior                         Anticoagulants: The patient has taken Plavix                         (clopidogrel), last dose was 7 days prior to                          procedure. ASA Grade Assessment: III - A patient with                         severe systemic disease. After reviewing the risks and                         benefits, the patient was deemed in satisfactory                         condition to undergo the procedure. The anesthesia                         plan was to use monitored anesthesia care (MAC).                         Immediately prior to administration of medications,                         the patient was re-assessed for adequacy to receive                         sedatives. The heart rate, respiratory rate, oxygen                         saturations, blood pressure, adequacy of pulmonary                         ventilation, and response to care were monitored                         throughout the procedure. The physical status of the                         patient was re-assessed after the procedure.                        After obtaining informed consent, the endoscope was                         passed under direct vision. Throughout the procedure,                         the patient's blood pressure, pulse, and oxygen                         saturations were monitored continuously. The Endoscope                         was introduced through the mouth, and advanced to the  second part of duodenum. The upper GI endoscopy was                         accomplished without difficulty. The patient tolerated                         the procedure well. Findings:      No endoscopic abnormality was evident in the esophagus to explain the       patient's complaint of dysphagia. Biopsies were obtained from the       proximal and distal esophagus with cold forceps for histology of       suspected eosinophilic esophagitis. Estimated blood loss was minimal.      A small hiatal hernia was present.      The entire examined stomach was normal.      The examined duodenum was normal. Impression:            - No  endoscopic esophageal abnormality to explain                         patient's dysphagia.                        - Small hiatal hernia.                        - Normal stomach.                        - Normal examined duodenum.                        - Biopsies were taken with a cold forceps for                         evaluation of eosinophilic esophagitis. Recommendation:        - Discharge patient to home.                        - Resume previous diet.                        - Continue present medications.                        - Await pathology results.                        - Return to referring physician as previously                         scheduled. Procedure Code(s):     --- Professional ---                        [phone], Esophagogastroduodenoscopy, flexible,                         transoral; with biopsy, single or multiple Diagnosis Code(s):     --- Professional ---                        R13.10, Dysphagia, unspecified  K44.9, Diaphragmatic hernia without obstruction or                         gangrene CPT copyright 2022 American Medical Association. All rights reserved. The codes documented in this report are preliminary and upon coder review may  be revised to meet current compliance requirements. Andrey Farmer MD, MD 11/19/2021 8:33:40 AM Number of Addenda: 0 Note Initiated On: 11/19/2021 7:22 AM Estimated Blood Loss:  Estimated blood loss was minimal.      [facility]

## 2021-11-19 NOTE — Interval H&P Note (Signed)
History and Physical Interval Note:  11/19/2021 7:57 AM  Abigail Walker  has presented today for surgery, with the diagnosis of esophageal dysphasia, CCA Screen.  The various methods of treatment have been discussed with the patient and family. After consideration of risks, benefits and other options for treatment, the patient has consented to  Procedure(s) with comments: COLONOSCOPY WITH PROPOFOL (N/A) - DM ESOPHAGOGASTRODUODENOSCOPY (EGD) WITH PROPOFOL (N/A) as a surgical intervention.  The patient's history has been reviewed, patient examined, no change in status, stable for surgery.  I have reviewed the patient's chart and labs.  Questions were answered to the patient's satisfaction.     Lesly Rubenstein  Ok to proceed with EGD/Colonoscopy

## 2021-11-19 NOTE — Op Note (Signed)
[facility] Gastroenterology Patient Name: Abigail Walker Procedure Date: 11/19/2021 7:22 AM MRN: [id number] Account #: [id number] Date of Birth: 11/24/1945 Admit Type: Outpatient Age: 76 Room: [facility] ENDO ROOM 3 Gender: Female Note Status: Finalized Instrument Name: Peds Colonoscope [id number] Procedure:             Colonoscopy Indications:           Screening for colorectal malignant neoplasm Providers:             Andrey Farmer MD, MD Medicines:             Monitored Anesthesia Care Complications:         No immediate complications. Estimated blood loss:                         Minimal. Procedure:             Pre-Anesthesia Assessment:                        - Prior to the procedure, a History and Physical was                         performed, and patient medications and allergies were                         reviewed. The patient is competent. The risks and                         benefits of the procedure and the sedation options and                         risks were discussed with the patient. All questions                         were answered and informed consent was obtained.                         Patient identification and proposed procedure were                         verified by the physician, the nurse, the                         anesthesiologist, the anesthetist and the technician                         in the endoscopy suite. Mental Status Examination:                         alert and oriented. Airway Examination: normal                         oropharyngeal airway and neck mobility. Respiratory                         Examination: clear to auscultation. CV Examination:                         normal. Prophylactic Antibiotics: The patient does not  require prophylactic antibiotics. Prior                         Anticoagulants: The patient has taken Plavix                         (clopidogrel), last dose was 7 days prior to                          procedure. ASA Grade Assessment: III - A patient with                         severe systemic disease. After reviewing the risks and                         benefits, the patient was deemed in satisfactory                         condition to undergo the procedure. The anesthesia                         plan was to use monitored anesthesia care (MAC).                         Immediately prior to administration of medications,                         the patient was re-assessed for adequacy to receive                         sedatives. The heart rate, respiratory rate, oxygen                         saturations, blood pressure, adequacy of pulmonary                         ventilation, and response to care were monitored                         throughout the procedure. The physical status of the                         patient was re-assessed after the procedure.                        After obtaining informed consent, the colonoscope was                         passed under direct vision. Throughout the procedure,                         the patient's blood pressure, pulse, and oxygen                         saturations were monitored continuously. The                         Colonoscope was introduced through the anus and  advanced to the the cecum, identified by appendiceal                         orifice and ileocecal valve. The colonoscopy was                         performed without difficulty. The patient tolerated                         the procedure well. The quality of the bowel                         preparation was good. The ileocecal valve, appendiceal                         orifice, and rectum were photographed. Findings:      The perianal and digital rectal examinations were normal.      A few small-mouthed diverticula were found in the sigmoid colon.      Internal hemorrhoids were found during retroflexion. The hemorrhoids        were Grade I (internal hemorrhoids that do not prolapse).      The exam was otherwise without abnormality on direct and retroflexion       views. Impression:            - Diverticulosis in the sigmoid colon.                        - Internal hemorrhoids.                        - The examination was otherwise normal on direct and                         retroflexion views.                        - No specimens collected. Recommendation:        - Discharge patient to home.                        - Resume previous diet.                        - Continue present medications.                        - Resume Plavix (clopidogrel) at prior dose today.                        - Repeat colonoscopy is not recommended due to current                         age (41 years or older) for screening purposes.                        - Return to referring physician as previously                         scheduled. Procedure Code(s):     --- Professional ---  G9562, Colorectal cancer screening; colonoscopy on                         individual not meeting criteria for high risk Diagnosis Code(s):     --- Professional ---                        Z12.11, Encounter for screening for malignant neoplasm                         of colon                        K64.0, First degree hemorrhoids                        K57.30, Diverticulosis of large intestine without                         perforation or abscess without bleeding CPT copyright 2022 American Medical Association. All rights reserved. The codes documented in this report are preliminary and upon coder review may  be revised to meet current compliance requirements. Andrey Farmer MD, MD 11/19/2021 8:36:27 AM Number of Addenda: 0 Note Initiated On: 11/19/2021 7:22 AM Scope Withdrawal Time: 0 hours 5 minutes 37 seconds  Total Procedure Duration: 0 hours 12 minutes 37 seconds  Estimated Blood Loss:  Estimated blood loss was  minimal.      [facility]

## 2021-11-20 NOTE — Progress Notes (Signed)
Non-identified Voicemail.  No Message Left. 

## 2021-11-22 ENCOUNTER — Encounter: Payer: Self-pay | Admitting: Gastroenterology

## 2021-11-22 LAB — SURGICAL PATHOLOGY

## 2021-11-23 ENCOUNTER — Other Ambulatory Visit: Payer: Self-pay | Admitting: Internal Medicine

## 2021-11-23 DIAGNOSIS — R911 Solitary pulmonary nodule: Secondary | ICD-10-CM

## 2021-11-23 DIAGNOSIS — J690 Pneumonitis due to inhalation of food and vomit: Secondary | ICD-10-CM

## 2021-11-23 IMAGING — DX DG HUMERUS 2V *L*
2 series · 2 of 2 positions shown · non-contrast
Comparison: None.

CLINICAL DATA: Multiple falls.

EXAM:
LEFT HUMERUS - 2+ VIEW

[humerus ap]
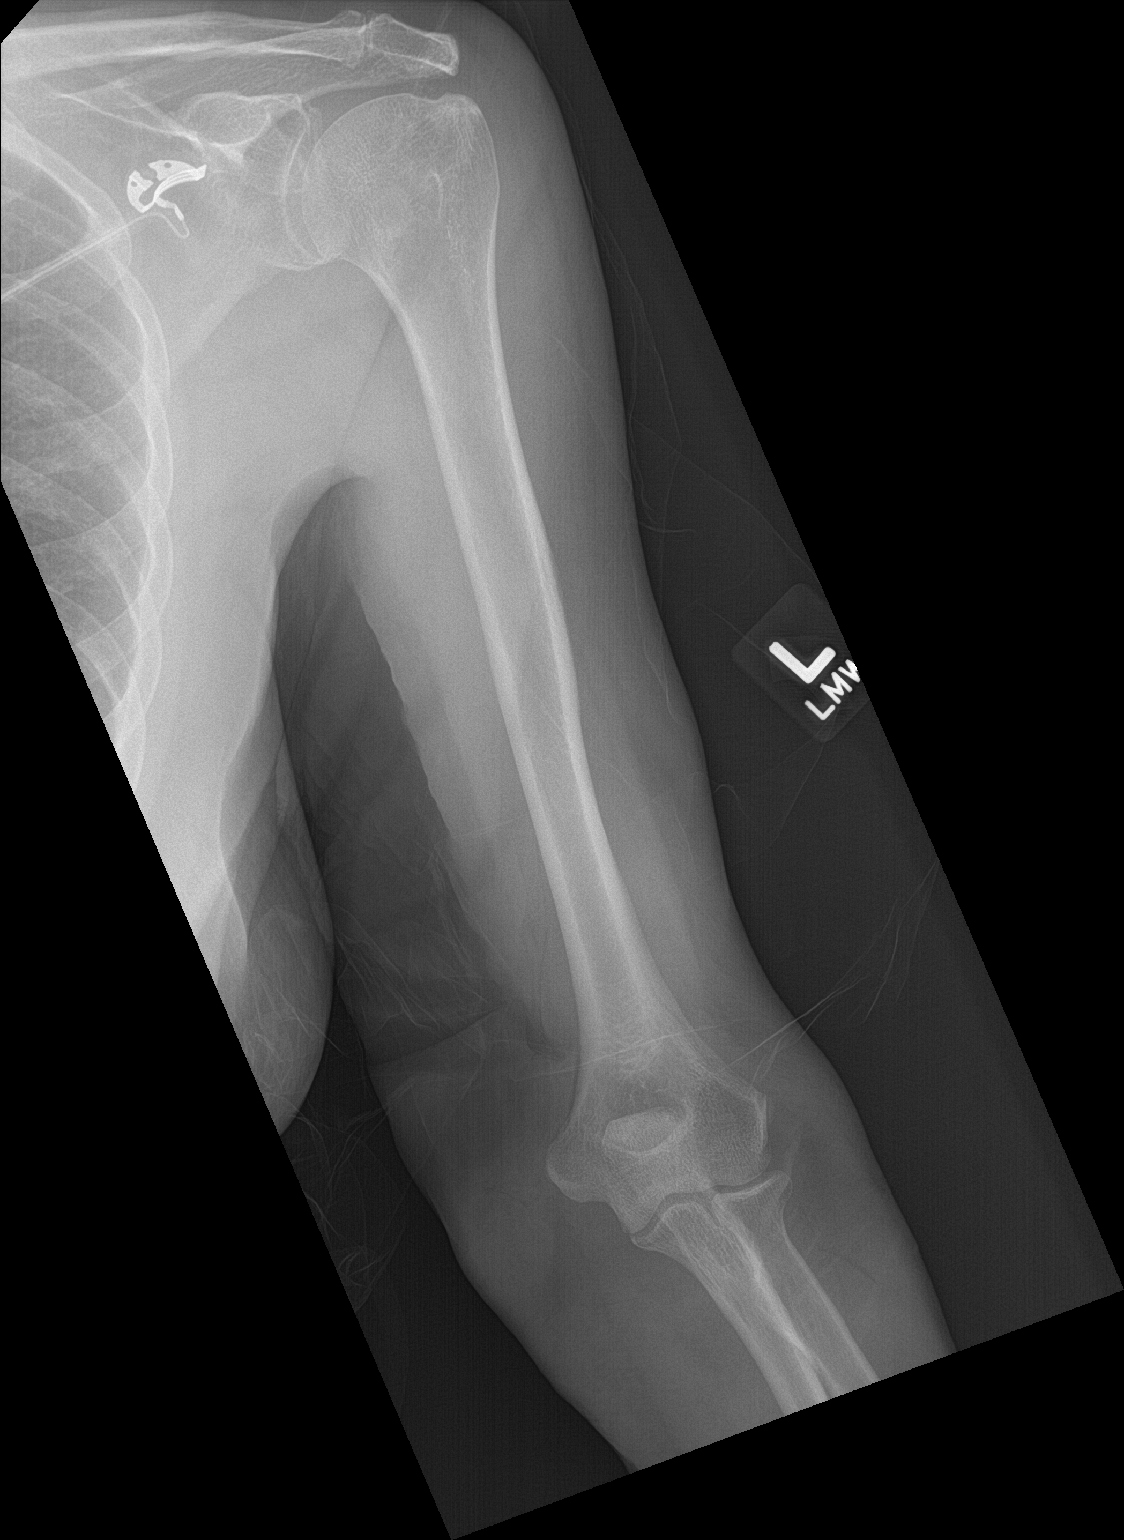

[humerus lat]
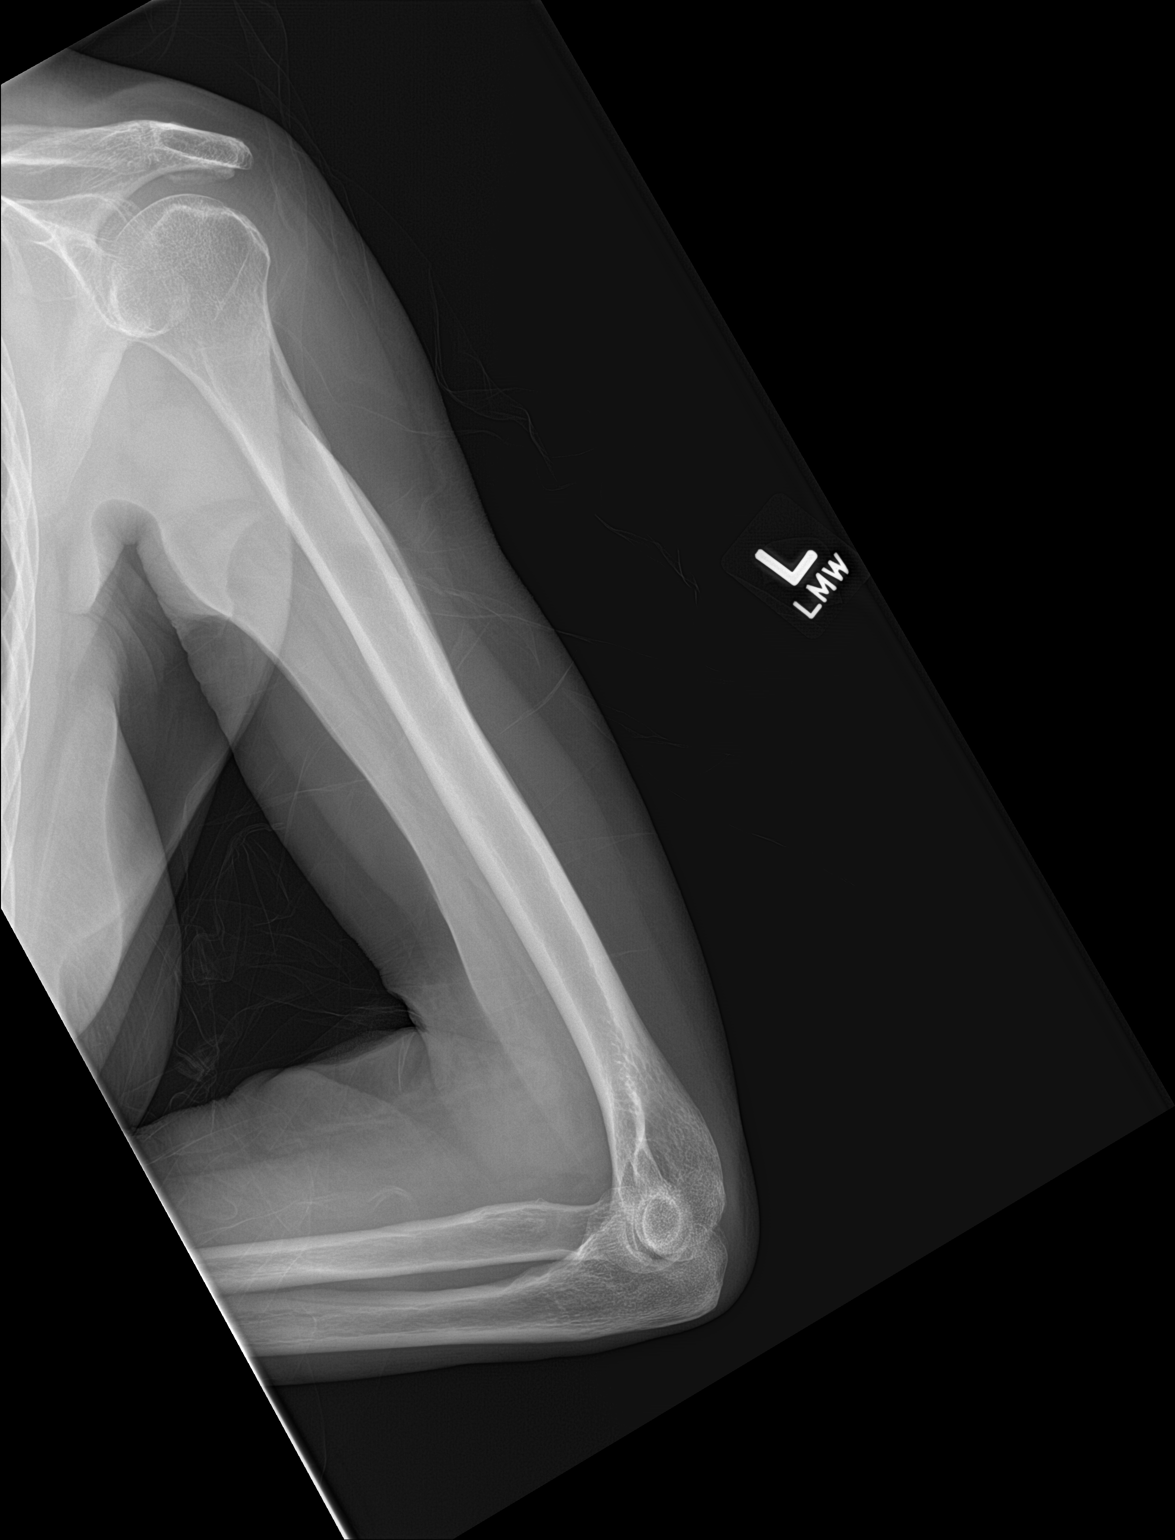

[2 of 2 positions shown; findings below may reference images not displayed]

FINDINGS: There is no evidence of fracture or other focal bone lesions. Soft
tissues are unremarkable.
IMPRESSION: Negative.

## 2021-12-01 ENCOUNTER — Ambulatory Visit
Admission: RE | Admit: 2021-12-01 | Discharge: 2021-12-01 | Disposition: A | Discharge status: Home / Self Care | Payer: Medicare Other | Source: Ambulatory Visit | Attending: Internal Medicine | Admitting: Internal Medicine

## 2021-12-01 DIAGNOSIS — R911 Solitary pulmonary nodule: Secondary | ICD-10-CM

## 2021-12-01 DIAGNOSIS — J690 Pneumonitis due to inhalation of food and vomit: Secondary | ICD-10-CM

## 2021-12-21 ENCOUNTER — Telehealth: Payer: Self-pay

## 2021-12-21 NOTE — Telephone Encounter (Signed)
Called and notified of post operative appointment with Dr. Theora Gianotti 01/19/2022 at 1330.

## 2022-01-06 ENCOUNTER — Other Ambulatory Visit: Payer: Self-pay | Admitting: Dermatology

## 2022-01-06 DIAGNOSIS — L439 Lichen planus, unspecified: Secondary | ICD-10-CM

## 2022-01-19 ENCOUNTER — Inpatient Hospital Stay: Payer: Medicare Other | Attending: Obstetrics and Gynecology | Admitting: Nurse Practitioner

## 2022-01-19 VITALS — BP 140/53 | HR 81 | Temp 96.6°F | Resp 20 | Wt 125.6 lb

## 2022-01-19 DIAGNOSIS — Z87891 Personal history of nicotine dependence: Secondary | ICD-10-CM | POA: Insufficient documentation

## 2022-01-19 DIAGNOSIS — C541 Malignant neoplasm of endometrium: Secondary | ICD-10-CM | POA: Diagnosis present

## 2022-01-19 NOTE — Progress Notes (Signed)
Gynecologic Oncology Interval Visit   Referring Provider: Dr. Leafy Ro  Chief Complaint: endometrial adenocarcinoma  Subjective:  Abigail Walker is a 77 y.o. female G1P1 who is seen in consultation from Dr. Ouida Sills for new diagnosis of endometrial adenocarcinoma.  On 12/13/2021 she underwent Robot assisted total laparoscopic hysterectomy, bilateral salpingo-oophorectomy, bilateral sentinel lymph node mapping and biopsies.   Pathology:  DIAGNOSIS A. Left external iliac sentinel lymph node, sentinel lymph node biopsy:  One lymph node, negative for malignancy (0/1).  B. Right external iliac sentinel lymph node, sentinel lymph node biopsy:  One lymph node, negative for malignancy (0/1).  C. Uterus, bilateral ovaries and fallopian tubes, hysterectomy and bilateral salpingo-oophorectomy:  Uterus: Endometrioid adenocarcinoma of the endometrium (4.0 cm), FIGO grade 1 of 3, invading 11 mm in a 14 mm thick myometrium, with focal microcystic elongated fragmented pattern of invasion (MELF). Negative for lymphovascular space invasion.  Remaining myometrium: No pathologic diagnosis. Cervix: Negative for malignancy. Serosa: Negative for malignancy.  Microsatellite instability testing and mismatch repair protein immunohistochemistry: Pending, to be reported separately from the molecular diagnostics laboratory.  P53 immunohistochemistry: Pending, to be reported in an addendum.  See synoptic report below.  Right adnexa: Ovary and fallopian tube (status post tubal ligation) with no pathologic diagnosis. Negative for malignancy.  Left adnexa: Ovary and fallopian tube (status post tubal ligation) with no pathologic diagnosis. Negative for malignancy.   Synoptic Report ENDOMETRIUM 8th Edition - Protocol posted: 12/14/2022ENDOMETRIUM: HYSTERECTOMY - All Specimens SPECIMEN Procedure Total hysterectomy and bilateral salpingo-oophorectomy Specimen Integrity Intact TUMOR Tumor Site  Endometrium Tumor Size Greatest Dimension (Centimeters): 4.0 cm Histologic Type Endometrioid carcinoma, NOS Histologic Grade FIGO grade 1 Two-Tier Grading System Low grade (encompassing FIGO 1 and 2) Myometrial Invasion Present Depth of Myometrial Invasion 11 mm Myometrial Thickness 14 mm Percentage of Myometrial Invasion Estimated to be 50% or greater Adenomyosis Not identified Uterine Serosa Involvement Not identified Lower Uterine Segment Involvement Present, myoinvasive Cervical Stromal Involvement Not identified Other Tissue / Organ Involvement Not identified Lymphatic and / or Vascular Invasion Not identified  Mismatch repair: INTACT Immunohistochemistry: Normal result. Expression of MLH1, MSH2, MSH6, and PMS2 is retained in the neoplastic cells. PCR: Microsatellite stable (MSS). Recommendation from Tumor Board at [facility] was for VBT (meets PORTEC and GOG 99 criteria).    She reports recovering from surgery well. She denies pain. Has been on pelvic rest. Denies complaints.      Gynecologic Oncology History Abigail Walker is a pleasant patient G1P1 who is seen in consultation from Dr. Ouida Sills for new diagnosis of endometrial adenocarcinoma.  Seen for annual exam in May 2022, mentioned PMB. Pap was unable to be collected due to lichen, loss of architecture, and stenosis. D&C and EUA was recommended. She returns 12/22 for surgical eval but did not show for ultrasound.   08/10/21- CT Angio Chest/Abd/Pelvis Uterus and adnexa were unremarkable. No lymphadenopathy. No ascites.  5 mm solid nodule in RML of lung.   September 2023 Re-presented and  underwent D&C, EUA, myosure polypectomy, lysis of vaginal adhesions with Dr. Leafy Ro 10/14/21.   DIAGNOSIS:  A. ENDOMETRIAL CURETTINGS:  - ENDOMETRIAL ADENOCARCINOMA, FIGO 1.  - CHRONIC ENDOMETRITIS   She has been using topical estrogen since surgery. She applies clobestasol and vaseline jelly on the external vulva.   History of  occipital stroke in 2021, cognitive impairment, falls, major depression, Charlie bonnet syndrome/hallucinations, diabetes, MUI s/p percutaneous tibial nerve stimulation 2016-2017.  She also has multiple other medical problems as noted below.  Her blood  sugars have improved per her report when her insulin was increased.  However she is able to tell us what her fasting blood sugars are.  Her last hemoglobin A1c was 7.  She also has a history of thyroid disease and last TSH 0.228 was low on 05/19/21.  VTE Risk: No known h/o VTE or family h/o VTE   Problem List: Patient Active Problem List   Diagnosis Date Noted   International Federation of Gynecology and Obstetrics (FIGO) malignant neoplasm of endometrium stage IB ([place]) 01/19/2022   Status post surgery 10/14/2021   Aspiration pneumonia ([place]) 08/11/2021   CAP (community acquired pneumonia) 08/10/2021   Chest pain 08/10/2021   Dysphagia 08/10/2021   Type 2 diabetes mellitus with peripheral neuropathy ([place]) 08/10/2021   PAD (peripheral artery disease) ([place]) 02/22/2020   Hypoglycemia due to type 2 diabetes mellitus ([place])    Hypothermia    Dehydration    Hypoglycemia 01/09/2020   Moderate protein-calorie malnutrition ([place]) 01/08/2020   Fall    Hyponatremia    Hyperglycemia 12/05/2019   Acute lower UTI 12/05/2019   Depression    Lumbar compression fracture (HCC)    Weakness of left foot 09/14/2019   Hyperlipidemia 08/13/2019   Hypercalcemia 05/01/2019   Leukopenia 01/17/2019   Stage 3 chronic kidney disease ([place]) 01/17/2019   Benign hypertensive kidney disease with chronic kidney disease 01/16/2019   Hyposmolality and/or hyponatremia 01/16/2019   Secondary hyperparathyroidism of renal origin ([place]) 01/16/2019   Carotid artery disease ([place]) 08/10/2017   Chronic midline low back pain without sciatica 03/16/2017   Primary osteoarthritis of both hands 01/16/2017   Benign essential tremor 08/12/2015   MGUS (monoclonal gammopathy of unknown  significance) 07/08/2015   Mixed incontinence 11/09/2014   Atrophic vaginitis 07/28/2014   Encounter for general adult medical examination without abnormal findings 05/28/2014   Major depression in remission ([place]) 11/27/2013   Morbid obesity ([place]) 11/16/2013   Microalbuminuria 08/06/2013   Breath shortness 08/02/2013   Acid reflux 06/16/2013   Benign hypertension 06/16/2013   Type 2 diabetes mellitus ([place]) 06/16/2013   Hypothyroidism 06/16/2013   Obstructive apnea 06/16/2013   Hyperlipidemia associated with type 2 diabetes mellitus ([place]) 48/25/0037   Oral lichen planus 04/88/8916   Lichen planus 94/50/3888    Past Medical History: Past Medical History:  Diagnosis Date   Adult hypothyroidism 06/16/2013   Anemia    Aortic atherosclerosis (HCC)    Arthritis    Benign hypertension 06/16/2013   Carotid artery disease ([place])    a.) carotid US 02/10/2020: 1-39% BICA; b.) carotid US 09/22/2021: 1-39% BICA   Sherran Needs syndrome    CKD (chronic kidney disease), stage III (HCC)    Coronary artery calcification seen on CT scan    Depression    Esophageal spasm    Esophageal stricture    GERD (gastroesophageal reflux disease)    HBV (hepatitis B virus) infection    High blood pressure    HLD (hyperlipidemia)    Hypothyroidism    LBBB (left bundle branch block)    Lichen planus    Long term current use of antithrombotics/antiplatelets    a.) clopidogrel   Mixed incontinence    OSA on CPAP 06/16/2013   Pneumonia 2023   RLS (restless legs syndrome)    Secondary hyperparathyroidism of renal origin ([place])    Skin cancer, basal cell    Sleep apnea    Stroke [facility])    Thyroid disease    Type 2 diabetes mellitus treated with insulin (  [facility])     Past Surgical History: Past Surgical History:  Procedure Laterality Date   CATARACT EXTRACTION Bilateral    COLONOSCOPY  2009   COLONOSCOPY WITH PROPOFOL N/A 11/19/2021   Procedure: COLONOSCOPY WITH PROPOFOL;  Surgeon: Lesly Rubenstein, MD;  Location: [facility] ENDOSCOPY;  Service: Endoscopy;  Laterality: N/A;  DM   DILATATION & CURETTAGE/HYSTEROSCOPY WITH MYOSURE  10/14/2021   Procedure: MYOSURE POLYPECTOMY;  Surgeon: Benjaman Kindler, MD;  Location: [facility] ORS;  Service: Gynecology;;   ESOPHAGOGASTRODUODENOSCOPY     2009, 2011   ESOPHAGOGASTRODUODENOSCOPY (EGD) WITH PROPOFOL N/A 06/08/2015   Procedure: ESOPHAGOGASTRODUODENOSCOPY (EGD) WITH PROPOFOL;  Surgeon: Hulen Luster, MD;  [facility];  Service: Gastroenterology;  Laterality: N/A;   ESOPHAGOGASTRODUODENOSCOPY (EGD) WITH PROPOFOL N/A 11/19/2021   Procedure: ESOPHAGOGASTRODUODENOSCOPY (EGD) WITH PROPOFOL;  Surgeon: Lesly Rubenstein, MD;  Location: [facility] ENDOSCOPY;  Service: Endoscopy;  Laterality: N/A;   HEMORRHOID SURGERY     HYSTEROSCOPY WITH D & C N/A 10/14/2021   Procedure: DILATATION AND CURETTAGE /HYSTEROSCOPY;  Surgeon: Benjaman Kindler, MD;  Location: [facility] ORS;  Service: Gynecology;  Laterality: N/A;   IMPLANTABLE CONTACT LENS IMPLANTATION     LYSIS OF ADHESION  10/14/2021   Procedure: LYSIS OF VAGINAL ADHESIONS;  Surgeon: Benjaman Kindler, MD;  Location: [facility] ORS;  Service: Gynecology;;   TONSILLECTOMY     TUBAL LIGATION      Past Gynecologic History:  Menarche: age 41 Post menopausal   OB History:  OB History  No obstetric history on file.  G1P1- vaginal delivery - her daughter has deceased  Family History: Family History  Problem Relation Age of Onset   Heart disease Father    Cancer Mother    Kidney failure Maternal Aunt     Social History: Social History   Socioeconomic History   Marital status: Single    Spouse name: Not on file   Number of children: Not on file   Years of education: Not on file   Highest education level: Not on file  Occupational History   Not on file  Tobacco Use   Smoking status: Former    Types: Cigarettes    Quit date: 07/27/1993    Years since quitting: 28.5   Smokeless tobacco: Never   Tobacco  comments:    quit 20 years  Vaping Use   Vaping Use: Never used  Substance and Sexual Activity   Alcohol use: No    Alcohol/week: 0.0 standard drinks of alcohol   Drug use: No   Sexual activity: Not Currently    Birth control/protection: Post-menopausal  Other Topics Concern   Not on file  Social History Narrative   Lives alone   Social Determinants of Health   Financial Resource Strain: Not on file  Food Insecurity: Not on file  Transportation Needs: Not on file  Physical Activity: Not on file  Stress: Not on file  Social Connections: Not on file  Intimate Partner Violence: Not on file    Allergies: Allergies  Allergen Reactions   Amoxicillin-Pot Clavulanate Rash   Metformin And Related Nausea And Vomiting   Sulfa Antibiotics Nausea And Vomiting   Hydrocortisone-Iodoquinol  [Hydrocortisone-Iodoquinol] Rash    Patient reports that she does not believe that she has allergy to this, that she was receiving many medications at once.    Current Medications: Current Outpatient Medications  Medication Sig Dispense Refill   acetaminophen (TYLENOL) 500 MG tablet Take 500 mg by mouth every 6 (six) hours as needed for  moderate pain.     buPROPion (WELLBUTRIN XL) 150 MG 24 hr tablet Take 3 tablets (450 mg total) by mouth at bedtime. 30 tablet 0   Cholecalciferol (VITAMIN D3) 50 MCG (2000 UT) TABS Take 2 tablets by mouth daily.     clobetasol ointment (TEMOVATE) 6.80 % APPLY ONE APPLICATION TOPICALLY DAILY. VAGINA 60 g 0   clopidogrel (PLAVIX) 75 MG tablet Take 1 tablet (75 mg total) by mouth at bedtime. 30 tablet 0   ferrous sulfate 325 (65 FE) MG tablet Take 1 tablet (325 mg total) by mouth daily with breakfast. (Patient taking differently: Take 325 mg by mouth daily.) 30 tablet 0   insulin glargine (LANTUS) 100 UNIT/ML injection Inject 0.15 mLs (15 Units total) into the skin at bedtime. (Patient taking differently: Inject 15 Units into the skin daily. morning) 10 mL 0   Insulin  Syringe-Needle U-100 (INSULIN SYRINGE 1CC/31GX5/16") 31G X 5/16" 1 ML MISC USE ONE SYRINGE TWICE DAILY AS DIRECTED WITH INSULIN VIALS     levothyroxine (SYNTHROID) 75 MCG tablet Take 1 tablet (75 mcg total) by mouth daily before breakfast. 30 tablet 0   lidocaine-prilocaine (EMLA) cream Apply 1 Application topically as needed. To your vulva for pain control 30 g 0   NOVOLOG 100 UNIT/ML injection Inject 6 Units into the skin in the morning and at bedtime. (Patient taking differently: Inject 4-6 Units into the skin in the morning and at bedtime. 4 units in the morning and 6 units at bedtime) 10 mL 0   nystatin cream (MYCOSTATIN) Apply 1 application topically as needed (yeast).     PARoxetine (PAXIL) 20 MG tablet Take 1 tablet (20 mg total) by mouth at bedtime. 30 tablet 0   rosuvastatin (CRESTOR) 40 MG tablet Take 1 tablet (40 mg total) by mouth at bedtime. 90 tablet 0   torsemide (DEMADEX) 10 MG tablet Take 1 tablet (10 mg total) by mouth every other day. 30 tablet 0   docusate sodium (COLACE) 100 MG capsule Take 1 capsule (100 mg total) by mouth daily. (Patient not taking: Reported on 01/19/2022) 30 capsule 0   terbinafine (LAMISIL) 250 MG tablet Take 250 mg by mouth daily. (Patient not taking: Reported on 01/19/2022)     No current facility-administered medications for this visit.    Review of Systems General:  no complaints Skin: no complaints Eyes: no complaints HEENT: no complaints Breasts: no complaints Pulmonary: no complaints Cardiac: no complaints Gastrointestinal: no complaints Genitourinary/Sexual: no complaints Ob/Gyn: no complaints Musculoskeletal: no complaints Hematology: no complaints Neurologic/Psych: no complaints   Objective:  Physical Examination:  BP (!) 140/53   Pulse 81   Temp (!) 96.6 F (35.9 C)   Resp 20   Wt 125 lb 9.6 oz (57 kg)   SpO2 100%   BMI 24.53 kg/m     ECOG Performance Status: 2 - Symptomatic, <50% confined to bed  GENERAL: Thin built,  petite female. Unaccompanied.  ABDOMEN:  Soft, nontender.  No hernias, incisions well healed. No masses or ascites EXTREMITIES:  No peripheral edema. Atraumatic. No cyanosis SKIN:  Incisions are healing well.  NEURO:  Nonfocal. Well oriented.  Appropriate affect.  Pelvic: Exam chaperoned by CMA.  EGBUS: mild erythema. No discrete lesions. Vagina: narrow speculum used. Unable to visualize top of vagina. BME: gentle breakdown of agglutination at top 1/4 of vagina. Vaginal cuff palpated. Intact and sutures still present. Cervix, Uterus: surgically absent. Rectovaginal: deferred  Lab Review  No labs on site today.  Assessment:  Abigail Walker is a 77 y.o. female diagnosed with stage Ib endometrioid endometrial cancer grade 1 with HIR based on age and invasion (pMMR,MSS)   Medical co-morbidities complicating care: Aspiration pneumonia/CAP (community acquired pneumonia); significant dysphagia; Type 2 diabetes mellitus with peripheral neuropathy; PAD (peripheral artery disease)/Carotid artery disease; Moderate protein-calorie malnutrition; Stage 3 chronic kidney disease; Benign hypertensive kidney disease with chronic kidney disease; MGUS (monoclonal gammopathy of unknown significance); Hypothyroidism; Obstructive apnea (Dx'd 06/16/2013 now resolved); and left bundle branch block with left axis deviation Plan:   Problem List Items Addressed This Visit       Genitourinary   International Federation of Gynecology and Obstetrics (FIGO) malignant neoplasm of endometrium stage IB ([place])   Other Visit Diagnoses     Endometrial adenocarcinoma ([place])    -  Primary      A discussion regarding adjuvant therapy and recommendations from Tumor Board. Discussed and recommended VBT. Referral to Radiation Oncology. I provided her with copy of her pathology report for her records.   She has chronic lichen planus which causes recurrent vaginal agglutination. Feel that she may benefit from vaginal dilators to  help keep the vagina open to allow for VBT and pelvic exams for surveillance. Will discuss with Dr Theora Gianotti and follow up with patient. I provided her with a small vaginal dilator today and discussed use but she will wait to start exercises until confirmed by Dr Theora Gianotti.   We will refer [facility] to [facility] for [facility] visit at her next appointment.   Suggested return to clinic in 4 months.   The patient's diagnosis, an outline of the further diagnostic and laboratory studies which will be required, the recommendation for surgery, and alternatives were discussed with her.  All questions were answered to their satisfaction.  Beckey Rutter, DNP, AGNP-C [place] at [facility] [phone] (clinic)  CC:   Benjaman Kindler, [address] [place],  [place] [address] [phone]

## 2022-01-19 NOTE — Addendum Note (Signed)
Addended by: Verlon Au on: 01/19/2022 02:52 PM   Modules accepted: Orders

## 2022-01-20 ENCOUNTER — Telehealth: Payer: Self-pay

## 2022-01-20 NOTE — Telephone Encounter (Signed)
Voicemail left with Ms. Dechellis to return call. Per Beckey Rutter NP she may begin dilator use as discussed.

## 2022-02-07 ENCOUNTER — Ambulatory Visit
Admission: RE | Admit: 2022-02-07 | Discharge: 2022-02-07 | Disposition: A | Discharge status: Home / Self Care | Payer: Medicare Other | Source: Ambulatory Visit | Attending: Radiation Oncology | Admitting: Radiation Oncology

## 2022-02-07 ENCOUNTER — Encounter: Payer: Self-pay | Admitting: Radiation Oncology

## 2022-02-07 VITALS — BP 188/79 | HR 86 | Temp 98.0°F | Resp 16 | Ht 60.5 in | Wt 124.0 lb

## 2022-02-07 DIAGNOSIS — E785 Hyperlipidemia, unspecified: Secondary | ICD-10-CM | POA: Diagnosis not present

## 2022-02-07 DIAGNOSIS — N183 Chronic kidney disease, stage 3 unspecified: Secondary | ICD-10-CM | POA: Insufficient documentation

## 2022-02-07 DIAGNOSIS — Z87891 Personal history of nicotine dependence: Secondary | ICD-10-CM | POA: Diagnosis not present

## 2022-02-07 DIAGNOSIS — C541 Malignant neoplasm of endometrium: Secondary | ICD-10-CM | POA: Insufficient documentation

## 2022-02-07 DIAGNOSIS — I10 Essential (primary) hypertension: Secondary | ICD-10-CM | POA: Insufficient documentation

## 2022-02-07 DIAGNOSIS — E1122 Type 2 diabetes mellitus with diabetic chronic kidney disease: Secondary | ICD-10-CM | POA: Diagnosis not present

## 2022-02-07 DIAGNOSIS — I700 Atherosclerosis of aorta: Secondary | ICD-10-CM | POA: Diagnosis not present

## 2022-02-07 DIAGNOSIS — I129 Hypertensive chronic kidney disease with stage 1 through stage 4 chronic kidney disease, or unspecified chronic kidney disease: Secondary | ICD-10-CM | POA: Diagnosis not present

## 2022-02-07 DIAGNOSIS — Z79890 Hormone replacement therapy (postmenopausal): Secondary | ICD-10-CM | POA: Diagnosis not present

## 2022-02-07 DIAGNOSIS — I2510 Atherosclerotic heart disease of native coronary artery without angina pectoris: Secondary | ICD-10-CM | POA: Insufficient documentation

## 2022-02-07 DIAGNOSIS — E039 Hypothyroidism, unspecified: Secondary | ICD-10-CM | POA: Insufficient documentation

## 2022-02-07 NOTE — Progress Notes (Signed)
Met with Abigail Walker during her consultation with radiation. She has not been using dilator consistently. Reiterated importance of use due to need to continue with pelvic exams as part of her surveillance.

## 2022-02-07 NOTE — Progress Notes (Signed)
NEW PATIENT EVALUATION  Name: Abigail Walker  MRN: [id number]  Date:   02/07/2022     DOB: 05-Oct-1945   This 77 y.o. female patient presents to the clinic for initial evaluation of stage Ib (pT1b pN0 M0 endometrioid carcinoma status post TAH/BSO and regional lymph node sampling FIGO grade 1 of 3 invading 11 mm of 40 mm of the myometrium.  REFERRING PHYSICIAN: Kirk Ruths, MD  CHIEF COMPLAINT:  Chief Complaint  Patient presents with   endometrial cancer    DIAGNOSIS: The encounter diagnosis was [employer] (FIGO) malignant neoplasm of endometrium stage IB ([place]).   PREVIOUS INVESTIGATIONS:  Pathology reports reviewed Clinical notes reviewed   HPI: Patient is a 77 year old female who presented with she underwent TAH/BSO with FIGO grade 1 of 3 endometrial carcinoma 4 cm in greatest dimension with 11 mm out of 14 mm myometrial invasion.  Sentinel node biopsies of left and right external iliac nodes were negative.  Patient has extensive lchen with significant stenosis of her vaginal vault.  She has done well postoperatively and.  She is now referred to radiation collagen for consideration of possible vaginal brachytherapy.  She has been supposed to be using a dilator although she has not been doing that on a regular basis.  She has had a CT scan of her chest which I have reviewed showing scattered pulmonary nodules measuring 5 mm or less with consensus statement recommending follow-up imaging.  She specifically denies any increased lower urinary tract symptoms diarrhea or fatigue.  PLANNED TREATMENT REGIMEN: Observation  PAST MEDICAL HISTORY:  has a past medical history of Adult hypothyroidism (06/16/2013), Anemia, Aortic atherosclerosis ([place]), Arthritis, Benign hypertension (06/16/2013), Carotid artery disease ([place]), Sherran Needs syndrome, CKD (chronic kidney disease), stage III ([place]), Coronary artery calcification seen on CT scan,  Depression, Esophageal spasm, Esophageal stricture, GERD (gastroesophageal reflux disease), HBV (hepatitis B virus) infection, High blood pressure, HLD (hyperlipidemia), Hypothyroidism, LBBB (left bundle branch block), Lichen planus, Long term current use of antithrombotics/antiplatelets, Mixed incontinence, OSA on CPAP (06/16/2013), Pneumonia (2023), RLS (restless legs syndrome), Secondary hyperparathyroidism of renal origin ([place]), Skin cancer, basal cell, Sleep apnea, Stroke ([place]), Thyroid disease, and Type 2 diabetes mellitus treated with insulin ([place]).    PAST SURGICAL HISTORY:  Past Surgical History:  Procedure Laterality Date   CATARACT EXTRACTION Bilateral    COLONOSCOPY  2009   COLONOSCOPY WITH PROPOFOL N/A 11/19/2021   Procedure: COLONOSCOPY WITH PROPOFOL;  Surgeon: Lesly Rubenstein, MD;  Location: [facility] ENDOSCOPY;  Service: Endoscopy;  Laterality: N/A;  DM   DILATATION & CURETTAGE/HYSTEROSCOPY WITH MYOSURE  10/14/2021   Procedure: MYOSURE POLYPECTOMY;  Surgeon: Benjaman Kindler, MD;  Location: [facility] ORS;  Service: Gynecology;;   ESOPHAGOGASTRODUODENOSCOPY     2009, 2011   ESOPHAGOGASTRODUODENOSCOPY (EGD) WITH PROPOFOL N/A 06/08/2015   Procedure: ESOPHAGOGASTRODUODENOSCOPY (EGD) WITH PROPOFOL;  Surgeon: Hulen Luster, MD;  Location: [facility] ENDOSCOPY;  Service: Gastroenterology;  Laterality: N/A;   ESOPHAGOGASTRODUODENOSCOPY (EGD) WITH PROPOFOL N/A 11/19/2021   Procedure: ESOPHAGOGASTRODUODENOSCOPY (EGD) WITH PROPOFOL;  Surgeon: Lesly Rubenstein, MD;  Location: [facility] ENDOSCOPY;  Service: Endoscopy;  Laterality: N/A;   HEMORRHOID SURGERY     HYSTEROSCOPY WITH D & C N/A 10/14/2021   Procedure: DILATATION AND CURETTAGE /HYSTEROSCOPY;  Surgeon: Benjaman Kindler, MD;  Location: [facility] ORS;  Service: Gynecology;  Laterality: N/A;   IMPLANTABLE CONTACT LENS IMPLANTATION     LYSIS OF ADHESION  10/14/2021   Procedure: LYSIS OF VAGINAL ADHESIONS;  Surgeon: Benjaman Kindler,  MD;  Location: [facility] ORS;   Service: Gynecology;;   TONSILLECTOMY     TUBAL LIGATION      FAMILY HISTORY: family history includes Cancer in her mother; Heart disease in her father; Kidney failure in her maternal aunt.  SOCIAL HISTORY:  reports that she quit smoking about 28 years ago. Her smoking use included cigarettes. She has never used smokeless tobacco. She reports that she does not drink alcohol and does not use drugs.  ALLERGIES: Amoxicillin-pot clavulanate, Metformin and related, Sulfa antibiotics, and Hydrocortisone-iodoquinol  [hydrocortisone-iodoquinol]  MEDICATIONS:  Current Outpatient Medications  Medication Sig Dispense Refill   acetaminophen (TYLENOL) 500 MG tablet Take 500 mg by mouth every 6 (six) hours as needed for moderate pain.     buPROPion (WELLBUTRIN XL) 150 MG 24 hr tablet Take 3 tablets (450 mg total) by mouth at bedtime. 30 tablet 0   Cholecalciferol (VITAMIN D3) 50 MCG (2000 UT) TABS Take 2 tablets by mouth daily.     clobetasol ointment (TEMOVATE) 7.62 % APPLY ONE APPLICATION TOPICALLY DAILY. VAGINA 60 g 0   clopidogrel (PLAVIX) 75 MG tablet Take 1 tablet (75 mg total) by mouth at bedtime. 30 tablet 0   docusate sodium (COLACE) 100 MG capsule Take 1 capsule (100 mg total) by mouth daily. 30 capsule 0   ferrous sulfate 325 (65 FE) MG tablet Take 1 tablet (325 mg total) by mouth daily with breakfast. (Patient taking differently: Take 325 mg by mouth daily.) 30 tablet 0   insulin glargine (LANTUS) 100 UNIT/ML injection Inject 0.15 mLs (15 Units total) into the skin at bedtime. (Patient taking differently: Inject 15 Units into the skin daily. morning) 10 mL 0   Insulin Syringe-Needle U-100 (INSULIN SYRINGE 1CC/31GX5/16") 31G X 5/16" 1 ML MISC USE ONE SYRINGE TWICE DAILY AS DIRECTED WITH INSULIN VIALS     levothyroxine (SYNTHROID) 75 MCG tablet Take 1 tablet (75 mcg total) by mouth daily before breakfast. 30 tablet 0   lidocaine-prilocaine (EMLA) cream Apply 1 Application topically as needed. To  your vulva for pain control 30 g 0   NOVOLOG 100 UNIT/ML injection Inject 6 Units into the skin in the morning and at bedtime. (Patient taking differently: Inject 4-6 Units into the skin in the morning and at bedtime. 4 units in the morning and 6 units at bedtime) 10 mL 0   nystatin cream (MYCOSTATIN) Apply 1 application topically as needed (yeast).     PARoxetine (PAXIL) 20 MG tablet Take 1 tablet (20 mg total) by mouth at bedtime. 30 tablet 0   rosuvastatin (CRESTOR) 40 MG tablet Take 1 tablet (40 mg total) by mouth at bedtime. 90 tablet 0   terbinafine (LAMISIL) 250 MG tablet Take 250 mg by mouth daily.     torsemide (DEMADEX) 10 MG tablet Take 1 tablet (10 mg total) by mouth every other day. 30 tablet 0   No current facility-administered medications for this encounter.    ECOG PERFORMANCE STATUS:  0 - Asymptomatic  REVIEW OF SYSTEMS: Patient has a history of aspiration pneumonia dysphagia type 2 diabetes peripheral artery disease hyponatremia hyperglycemia history of recurrent UTIs mixed incontinence lichen planus. Patient denies any weight loss, fatigue, weakness, fever, chills or night sweats. Patient denies any loss of vision, blurred vision. Patient denies any ringing  of the ears or hearing loss. No irregular heartbeat. Patient denies heart murmur or history of fainting. Patient denies any chest pain or pain radiating to her upper extremities. Patient denies any shortness of breath,  difficulty breathing at night, cough or hemoptysis. Patient denies any swelling in the lower legs. Patient denies any nausea vomiting, vomiting of blood, or coffee ground material in the vomitus. Patient denies any stomach pain. Patient states has had normal bowel movements no significant constipation or diarrhea. Patient denies any dysuria, hematuria or significant nocturia. Patient denies any problems walking, swelling in the joints or loss of balance. Patient denies any skin changes, loss of hair or loss of  weight. Patient denies any excessive worrying or anxiety or significant depression. Patient denies any problems with insomnia. Patient denies excessive thirst, polyuria, polydipsia. Patient denies any swollen glands, patient denies easy bruising or easy bleeding. Patient denies any recent infections, allergies or URI. Patient "s visual fields have not changed significantly in recent time.   PHYSICAL EXAM: BP (!) 188/79 (BP Location: Right Arm, Patient Position: Sitting, Cuff Size: Small)   Pulse 86   Temp 98 F (36.7 C) (Tympanic)   Resp 16   Ht 5' 0.5" (1.537 m) Comment: stated ht  Wt 124 lb (56.2 kg)   BMI 23.82 kg/m  Frail-appearing female in NAD.  On speculum examination is very difficult to even insert the speculum small size I possibly have.  She has significant lichen planus of the pubic region.  Well-developed well-nourished patient in NAD. HEENT reveals PERLA, EOMI, discs not visualized.  Oral cavity is clear. No oral mucosal lesions are identified. Neck is clear without evidence of cervical or supraclavicular adenopathy. Lungs are clear to A&P. Cardiac examination is essentially unremarkable with regular rate and rhythm without murmur rub or thrill. Abdomen is benign with no organomegaly or masses noted. Motor sensory and DTR levels are equal and symmetric in the upper and lower extremities. Cranial nerves II through XII are grossly intact. Proprioception is intact. No peripheral adenopathy or edema is identified. No motor or sensory levels are noted. Crude visual fields are within normal range.  LABORATORY DATA: Pathology reports reviewed    RADIOLOGY RESULTS: CT scans reviewed compatible with above-stated findings   IMPRESSION: Stage Ib endometrial adenocarcinoma status post TAH/BSO and pelvic sentinel lymph node biopsies in 77 year old female.  PLAN: Based on the patient's already significant vaginal stenosis the 1 grade 1 histology at this time I will defer vaginal brachytherapy.   I believe the risks outweigh the benefits in this situation for prevention of local regional recurrence.  I have made my recommendations clear to the patient with our nurse navigator present.  She will follow-up in the future with Dr. Theora Gianotti.  I be happy to reevaluate her anytime should that be indicated.  I would like to take this opportunity to thank you for allowing me to participate in the care of your patient.Noreene Filbert, MD

## 2022-05-18 ENCOUNTER — Inpatient Hospital Stay: Payer: Medicare Other | Attending: Obstetrics and Gynecology | Admitting: Obstetrics and Gynecology

## 2022-05-18 VITALS — BP 136/45 | HR 80 | Temp 97.6°F | Resp 19 | Wt 127.7 lb

## 2022-05-18 DIAGNOSIS — Z923 Personal history of irradiation: Secondary | ICD-10-CM | POA: Diagnosis not present

## 2022-05-18 DIAGNOSIS — Z08 Encounter for follow-up examination after completed treatment for malignant neoplasm: Secondary | ICD-10-CM | POA: Diagnosis present

## 2022-05-18 DIAGNOSIS — C541 Malignant neoplasm of endometrium: Secondary | ICD-10-CM

## 2022-05-18 DIAGNOSIS — Z90710 Acquired absence of both cervix and uterus: Secondary | ICD-10-CM | POA: Diagnosis not present

## 2022-05-18 DIAGNOSIS — Z90722 Acquired absence of ovaries, bilateral: Secondary | ICD-10-CM | POA: Insufficient documentation

## 2022-05-18 DIAGNOSIS — Z8542 Personal history of malignant neoplasm of other parts of uterus: Secondary | ICD-10-CM | POA: Diagnosis present

## 2022-05-18 NOTE — Progress Notes (Signed)
Gynecologic Oncology Interval Visit   Referring Provider: Dr. Dalbert Garnet  Chief Complaint: endometrial adenocarcinoma  Subjective:  Abigail Walker is a 77 y.o. female G1P1 who is seen in consultation from Dr. Dareen Piano for new diagnosis of endometrial adenocarcinoma.  Patient returns today for surveillance.  No new gyn complaints.   Did not have vaginal brachy because vagina too stenotic to accommodate mold.  Uses narrow dilator occasionally.  Gynecologic Oncology History Abigail Walker is a pleasant patient G1P1 who is seen in consultation from Dr. Dareen Piano for new diagnosis of endometrial adenocarcinoma.  Seen for annual exam in May 2022, mentioned PMB. Pap was unable to be collected due to lichen, loss of architecture, and stenosis. D&C and EUA was recommended. She returns 12/22 for surgical eval but did not show for ultrasound.   08/10/21- CT Angio Chest/Abd/Pelvis Uterus and adnexa were unremarkable. No lymphadenopathy. No ascites.  5 mm solid nodule in RML of lung.   September 2023 Re-presented and  underwent D&C, EUA, myosure polypectomy, lysis of vaginal adhesions with Dr. Dalbert Garnet 10/14/21.   DIAGNOSIS:  A. ENDOMETRIAL CURETTINGS:  - ENDOMETRIAL ADENOCARCINOMA, FIGO 1.  - CHRONIC ENDOMETRITIS   She has been using topical estrogen since surgery. She applies clobestasol and vaseline jelly on the external vulva.   On 12/13/2021 she underwent Robot assisted total laparoscopic hysterectomy, bilateral salpingo-oophorectomy, bilateral sentinel lymph node mapping and biopsies.   Pathology:  DIAGNOSIS A. Left external iliac sentinel lymph node, sentinel lymph node biopsy:  One lymph node, negative for malignancy (0/1).  B. Right external iliac sentinel lymph node, sentinel lymph node biopsy:  One lymph node, negative for malignancy (0/1).  C. Uterus, bilateral ovaries and fallopian tubes, hysterectomy and bilateral salpingo-oophorectomy:  Uterus: Endometrioid adenocarcinoma of  the endometrium (4.0 cm), FIGO grade 1 of 3, invading 11 mm in a 14 mm thick myometrium, with focal microcystic elongated fragmented pattern of invasion (MELF). Negative for lymphovascular space invasion.  Remaining myometrium: No pathologic diagnosis. Cervix: Negative for malignancy. Serosa: Negative for malignancy.  Microsatellite instability testing and mismatch repair protein immunohistochemistry: Pending, to be reported separately from the molecular diagnostics laboratory.  P53 immunohistochemistry: Pending, to be reported in an addendum.  See synoptic report below.  Right adnexa: Ovary and fallopian tube (status post tubal ligation) with no pathologic diagnosis. Negative for malignancy.  Left adnexa: Ovary and fallopian tube (status post tubal ligation) with no pathologic diagnosis. Negative for malignancy.  Synoptic Report ENDOMETRIUM 8th Edition - Protocol posted: 12/14/2022ENDOMETRIUM: HYSTERECTOMY - All Specimens SPECIMEN Procedure Total hysterectomy and bilateral salpingo-oophorectomy Specimen Integrity Intact TUMOR Tumor Site Endometrium Tumor Size Greatest Dimension (Centimeters): 4.0 cm Histologic Type Endometrioid carcinoma, NOS Histologic Grade FIGO grade 1 Two-Tier Grading System Low grade (encompassing FIGO 1 and 2) Myometrial Invasion Present Depth of Myometrial Invasion 11 mm Myometrial Thickness 14 mm Percentage of Myometrial Invasion Estimated to be 50% or greater Adenomyosis Not identified Uterine Serosa Involvement Not identified Lower Uterine Segment Involvement Present, myoinvasive Cervical Stromal Involvement Not identified Other Tissue / Organ Involvement Not identified Lymphatic and / or Vascular Invasion Not identified  Mismatch repair: INTACT Immunohistochemistry: Normal result. Expression of MLH1, MSH2, MSH6, and PMS2 is retained in the neoplastic cells. PCR: Microsatellite stable (MSS). Recommendation from [facility] Board at [facility] was for  VBT (meets PORTEC and GOG 99 criteria).   History of occipital stroke in 2021, cognitive impairment, falls, major depression, Charlie bonnet syndrome/hallucinations, diabetes, MUI s/p percutaneous tibial nerve stimulation 2016-2017.  She also has multiple other medical  problems as noted below.  Her blood sugars have improved per her report when her insulin was increased.  However she is able to tell us what her fasting blood sugars are.  Her last hemoglobin A1c was 7.  She also has a history of thyroid disease and last TSH 0.228 was low on 05/19/21.  VTE Risk: No known h/o VTE or family h/o VTE   Problem List: Patient Active Problem List   Diagnosis Date Noted   International Federation of Gynecology and Obstetrics (FIGO) malignant neoplasm of endometrium stage IB (HCC) 01/19/2022   Status post surgery 10/14/2021   Aspiration pneumonia (HCC) 08/11/2021   CAP (community acquired pneumonia) 08/10/2021   Chest pain 08/10/2021   Dysphagia 08/10/2021   Type 2 diabetes mellitus with peripheral neuropathy (HCC) 08/10/2021   PAD (peripheral artery disease) (HCC) 02/22/2020   Hypoglycemia due to type 2 diabetes mellitus (HCC)    Hypothermia    Dehydration    Hypoglycemia 01/09/2020   Moderate protein-calorie malnutrition (HCC) 01/08/2020   Fall    Hyponatremia    Hyperglycemia 12/05/2019   Acute lower UTI 12/05/2019   Depression    Lumbar compression fracture (HCC)    Weakness of left foot 09/14/2019   Hyperlipidemia 08/13/2019   Hypercalcemia 05/01/2019   Leukopenia 01/17/2019   Stage 3 chronic kidney disease (HCC) 01/17/2019   Benign hypertensive kidney disease with chronic kidney disease 01/16/2019   Hyposmolality and/or hyponatremia 01/16/2019   Secondary hyperparathyroidism of renal origin (HCC) 01/16/2019   Carotid artery disease (HCC) 08/10/2017   Chronic midline low back pain without sciatica 03/16/2017   Primary osteoarthritis of both hands 01/16/2017   Benign essential tremor  08/12/2015   MGUS (monoclonal gammopathy of unknown significance) 07/08/2015   Mixed incontinence 11/09/2014   Atrophic vaginitis 07/28/2014   Encounter for general adult medical examination without abnormal findings 05/28/2014   Major depression in remission (HCC) 11/27/2013   Morbid obesity (HCC) 11/16/2013   Microalbuminuria 08/06/2013   Breath shortness 08/02/2013   Acid reflux 06/16/2013   Benign hypertension 06/16/2013   Type 2 diabetes mellitus (HCC) 06/16/2013   Hypothyroidism 06/16/2013   Obstructive apnea 06/16/2013   Hyperlipidemia associated with type 2 diabetes mellitus (HCC) 16/10/9602   Oral lichen planus 05/10/2011   Lichen planus 11/09/2010    Past Medical History: Past Medical History:  Diagnosis Date   Adult hypothyroidism 06/16/2013   Anemia    Aortic atherosclerosis (HCC)    Arthritis    Benign hypertension 06/16/2013   Carotid artery disease (HCC)    a.) carotid US 02/10/2020: 1-39% BICA; b.) carotid US 09/22/2021: 1-39% BICA   [name] syndrome    CKD (chronic kidney disease), stage III (HCC)    Coronary artery calcification seen on CT scan    Depression    Esophageal spasm    Esophageal stricture    GERD (gastroesophageal reflux disease)    HBV (hepatitis B virus) infection    High blood pressure    HLD (hyperlipidemia)    Hypothyroidism    LBBB (left bundle branch block)    Lichen planus    Long term current use of antithrombotics/antiplatelets    a.) clopidogrel   Mixed incontinence    OSA on CPAP 06/16/2013   Pneumonia 2023   RLS (restless legs syndrome)    Secondary hyperparathyroidism of renal origin (HCC)    Skin cancer, basal cell    Sleep apnea    Stroke [facility])    Thyroid disease  Type 2 diabetes mellitus treated with insulin [facility])     Past Surgical History: Past Surgical History:  Procedure Laterality Date   CATARACT EXTRACTION Bilateral    COLONOSCOPY  2009   COLONOSCOPY WITH PROPOFOL N/A 11/19/2021   Procedure:  COLONOSCOPY WITH PROPOFOL;  Surgeon: Regis Bill, MD;  Location: [facility] ENDOSCOPY;  Service: Endoscopy;  Laterality: N/A;  DM   DILATATION & CURETTAGE/HYSTEROSCOPY WITH MYOSURE  10/14/2021   Procedure: MYOSURE POLYPECTOMY;  Surgeon: Christeen Douglas, MD;  Location: [facility] ORS;  Service: Gynecology;;   ESOPHAGOGASTRODUODENOSCOPY     2009, 2011   ESOPHAGOGASTRODUODENOSCOPY (EGD) WITH PROPOFOL N/A 06/08/2015   Procedure: ESOPHAGOGASTRODUODENOSCOPY (EGD) WITH PROPOFOL;  Surgeon: Wallace Cullens, MD;  Location: [facility] ENDOSCOPY;  Service: Gastroenterology;  Laterality: N/A;   ESOPHAGOGASTRODUODENOSCOPY (EGD) WITH PROPOFOL N/A 11/19/2021   Procedure: ESOPHAGOGASTRODUODENOSCOPY (EGD) WITH PROPOFOL;  Surgeon: Regis Bill, MD;  Location: [facility] ENDOSCOPY;  Service: Endoscopy;  Laterality: N/A;   HEMORRHOID SURGERY     HYSTEROSCOPY WITH D & C N/A 10/14/2021   Procedure: DILATATION AND CURETTAGE /HYSTEROSCOPY;  Surgeon: Christeen Douglas, MD;  Location: [facility] ORS;  Service: Gynecology;  Laterality: N/A;   IMPLANTABLE CONTACT LENS IMPLANTATION     LYSIS OF ADHESION  10/14/2021   Procedure: LYSIS OF VAGINAL ADHESIONS;  Surgeon: Christeen Douglas, MD;  Location: [facility] ORS;  Service: Gynecology;;   TONSILLECTOMY     TUBAL LIGATION      Past Gynecologic History:  Menarche: age 58 Post menopausal   OB History:  OB History  No obstetric history on file.  G1P1- vaginal delivery - her daughter has deceased  Family History: Family History  Problem Relation Age of Onset   Heart disease Father    Cancer Mother    Kidney failure Maternal Aunt     Social History: Social History   Socioeconomic History   Marital status: Single    Spouse name: Not on file   Number of children: Not on file   Years of education: Not on file   Highest education level: Not on file  Occupational History   Not on file  Tobacco Use   Smoking status: Former    Types: Cigarettes    Quit date: 07/27/1993    Years since  quitting: 28.8   Smokeless tobacco: Never   Tobacco comments:    quit 20 years  Vaping Use   Vaping Use: Never used  Substance and Sexual Activity   Alcohol use: No    Alcohol/week: 0.0 standard drinks of alcohol   Drug use: No   Sexual activity: Not Currently    Birth control/protection: Post-menopausal  Other Topics Concern   Not on file  Social History Narrative   Lives alone   Social Determinants of Health   Financial Resource Strain: Not on file  Food Insecurity: Not on file  Transportation Needs: Not on file  Physical Activity: Not on file  Stress: Not on file  Social Connections: Not on file  Intimate Partner Violence: Not on file    Allergies: Allergies  Allergen Reactions   Amoxicillin-Pot Clavulanate Rash   Metformin And Related Nausea And Vomiting   Sulfa Antibiotics Nausea And Vomiting   Hydrocortisone-Iodoquinol  [Hydrocortisone-Iodoquinol] Rash    Patient reports that she does not believe that she has allergy to this, that she was receiving many medications at once.    Current Medications: Current Outpatient Medications  Medication Sig Dispense Refill   acetaminophen (TYLENOL) 500 MG tablet Take 500 mg by mouth  every 6 (six) hours as needed for moderate pain.     buPROPion (WELLBUTRIN XL) 150 MG 24 hr tablet Take 3 tablets (450 mg total) by mouth at bedtime. 30 tablet 0   Cholecalciferol (VITAMIN D3) 50 MCG (2000 UT) TABS Take 2 tablets by mouth daily.     clobetasol ointment (TEMOVATE) 0.05 % APPLY ONE APPLICATION TOPICALLY DAILY. VAGINA 60 g 0   clopidogrel (PLAVIX) 75 MG tablet Take 1 tablet (75 mg total) by mouth at bedtime. 30 tablet 0   docusate sodium (COLACE) 100 MG capsule Take 1 capsule (100 mg total) by mouth daily. 30 capsule 0   ferrous sulfate 325 (65 FE) MG tablet Take 1 tablet (325 mg total) by mouth daily with breakfast. (Patient taking differently: Take 325 mg by mouth daily.) 30 tablet 0   insulin glargine (LANTUS) 100 UNIT/ML injection  Inject 0.15 mLs (15 Units total) into the skin at bedtime. (Patient taking differently: Inject 15 Units into the skin daily. morning) 10 mL 0   Insulin Syringe-Needle U-100 (INSULIN SYRINGE 1CC/31GX5/16") 31G X 5/16" 1 ML MISC USE ONE SYRINGE TWICE DAILY AS DIRECTED WITH INSULIN VIALS     levothyroxine (SYNTHROID) 75 MCG tablet Take 1 tablet (75 mcg total) by mouth daily before breakfast. 30 tablet 0   lidocaine-prilocaine (EMLA) cream Apply 1 Application topically as needed. To your vulva for pain control 30 g 0   NOVOLOG 100 UNIT/ML injection Inject 6 Units into the skin in the morning and at bedtime. (Patient taking differently: Inject 4-6 Units into the skin in the morning and at bedtime. 4 units in the morning and 6 units at bedtime) 10 mL 0   nystatin cream (MYCOSTATIN) Apply 1 application topically as needed (yeast).     PARoxetine (PAXIL) 20 MG tablet Take 1 tablet (20 mg total) by mouth at bedtime. 30 tablet 0   rosuvastatin (CRESTOR) 40 MG tablet Take 1 tablet (40 mg total) by mouth at bedtime. 90 tablet 0   terbinafine (LAMISIL) 250 MG tablet Take 250 mg by mouth daily.     torsemide (DEMADEX) 10 MG tablet Take 1 tablet (10 mg total) by mouth every other day. 30 tablet 0   No current facility-administered medications for this visit.   Review of Systems General:  no complaints Skin: no complaints Eyes: no complaints HEENT: no complaints Breasts: no complaints Pulmonary: no complaints Cardiac: no complaints Gastrointestinal: no complaints Genitourinary/Sexual: no complaints Ob/Gyn: no complaints Musculoskeletal: no complaints Hematology: no complaints Neurologic/Psych: no complaints  Objective:  Physical Examination:  BP (!) 136/45   Pulse 80   Temp 97.6 F (36.4 C)   Resp 19   Wt 127 lb 11.2 oz (57.9 kg)   SpO2 99%   BMI 24.53 kg/m     ECOG Performance Status: 1 - Symptomatic but completely ambulatory  GENERAL: Thin built, petite female. Unaccompanied.  ABDOMEN:   Soft, nontender.  No hernias, incisions well healed. No masses or ascites EXTREMITIES:  No peripheral edema. Atraumatic. No cyanosis SKIN:  Incisions are healing well.  NEURO:  Nonfocal. Well oriented.  Appropriate affect.  Pelvic: per last visit.  Exam chaperoned by CMA.  EGBUS: mild erythema. No discrete lesions. Vagina: narrow speculum used. BME: gentle breakdown of agglutination at top 1/4 of vagina. Vaginal cuff palpated. Intact and sutures still present. Cervix, Uterus: surgically absent. Rectovaginal: deferred  Lab Review  No labs on site today.     Assessment:  Abigail Walker is a 77 y.o. female  diagnosed 11/23 with stage Ib endometrioid endometrial cancer grade 1 with HIR based on age and deep invasion (pMMR,MSS).  On 12/13/2021 she underwent robot assisted total laparoscopic hysterectomy, bilateral salpingo-oophorectomy, bilateral sentinel lymph node mapping and biopsies.  Endometrioid adenocarcinoma of the endometrium (4.0 cm), FIGO grade 1 of 3, invading 11 mm in a 14 mm thick myometrium, with focal microcystic elongated fragmented pattern of invasion (MELF). Negative for lymphovascular space invasion.  Adjuvant vaginal brachytherapy recommended, but patient has lichen planus and vagina would not accommodate vaginal mold. She refuses pelvic today, but no discharge or bleeding.   Medical co-morbidities complicating care: Aspiration pneumonia/CAP (community acquired pneumonia); significant dysphagia; Type 2 diabetes mellitus with peripheral neuropathy; PAD (peripheral artery disease)/Carotid artery disease; Moderate protein-calorie malnutrition; Stage 3 chronic kidney disease; Benign hypertensive kidney disease with chronic kidney disease; MGUS (monoclonal gammopathy of unknown significance); Hypothyroidism; Obstructive apnea (Dx'd 06/16/2013 now resolved); and left bundle branch block with left axis deviation Plan:   Problem List Items Addressed This Visit       Genitourinary    International Federation of Gynecology and Obstetrics (FIGO) malignant neoplasm of endometrium stage IB (HCC) - Primary    She has chronic lichen planus which causes recurrent vaginal agglutination. Uses narrow vaginal dilator occasionally.  Refuses pelvic exam today, but will allow this if bleeding or discharge in the future.   [facility] will [facility] to [facility] visit at her next appointment.   Suggested return to clinic in 4 months.   The patient's diagnosis, an outline of the further diagnostic and laboratory studies which will be required, the recommendation for surgery, and alternatives were discussed with her.  All questions were answered to their satisfaction.  I personally interviewed and examined the patient. Agreed with the above/below plan of care. I have directly contributed to assessment and plan of care of this patient and educated and discussed with patient and family.  Leida Lauth, MD   CC:   Christeen Douglas, MD [address] [place],  [place] [address] [phone]

## 2022-06-04 NOTE — Progress Notes (Unsigned)
Psychiatric Initial Adult Assessment   Patient Identification: Abigail Walker MRN:  [id number] Date of Evaluation:  06/06/2022 Referral Source: Lauro Regulus, MD  Chief Complaint:   Chief Complaint  Patient presents with   Establish Care   Visit Diagnosis:    ICD-10-CM   1. MDD (major depressive disorder), recurrent episode, mild (HCC)  F33.0     2. Neurocognitive deficits  R29.818    R41.89       History of Present Illness:   Abigail Walker is a 77 y.o. year old female with a history of depression, endometrial adenocarcinoma s/p hysterectomy and bilateral SPO, not on chemotherapy or radiation secondary to lichen planus, right occipital infartcs in 2021, cognitive impairments, type II diabetes, hypertension, hyperlipidemia, LBBB, who is referred for depression.   She states that she would like to be prescribed her medication for 90 days for depression.  She states that her mood depends on what is going on.  She reports frustration about this office.  She had difficulty in getting a hold of the person.  She does not answer her voice message due to the phone call she received in a day.  She is also busy in the garden during the summertime.  She states she was feeling very down and had fleeting SI without any plan or intent when she was struggling with finances after she bought the car.  She states that her neighbor pushed to buy a car.  Although she did not intend to buy a car on the day, she ended up doing so.  She also underwent procedure for lichen planus, hysterectomy in Nov 2023.  She had issues with the roof.  She believes her mood has been better since she decided to sell the car.  She feels good about reconnecting with her grandchildren, who she has not been able to communicate with for many years since the loss of her daughter from cancer.   Depression- The patient has mood symptoms as in PHQ-9/GAD-7.  She sleeps at 3 AM, and wakes up at 10 AM.  She has slight decrease in  appetite due to not having any teeth.  She enjoys gardening.  Although she used to enjoy reading, she is more into the television lately.  She denies irritability except she snapped at others when she was feeling depressed several months ago.  She denies SI.   Hallucinations-she hears singing, woman talking every day.  She denies VH.  She denies any concern about these.  She denies paranoia .  Memory-she denies any feeling confused.  She denies any issues with driving.   Substance use  Tobacco Alcohol Other substances/  Current denies denies denies  Past Smoked 20 years in the past, last in 1990's Denies (hated alcohol due to her mother's history of alcohol use) denies  Past Treatment        Medication- Paxil 20 mg daily , Bupropion 450 mg daily (since late 1990's, at the current dose at least for six years).  She reports decrease in appetite from higher dose of Paxil.  She feels good about the current medication regimen at this time.  Support: Household: by herself Marital status: divorced after ten years of marriage,  until 1979 Number of children: 1 daughter (deceased from cancer. She used to have issues with drug).  She was unable to see her grandchildren for 10 years since the loss of her daughter, although she reconnected recently Employment: retired, Public librarian in the past Education: High school.  Received six months of nurse's training at age 49 She states that her parents did not pay attention to her.  She never had hugs, kisses except from her maternal grandmother.  Her mother abused alcohol, and she "raised myself."   Wt Readings from Last 3 Encounters:  06/06/22 125 lb 9.6 oz (57 kg)  05/18/22 127 lb 11.2 oz (57.9 kg)  02/07/22 124 lb (56.2 kg)    Associated Signs/Symptoms: Depression Symptoms:  depressed mood, anhedonia, insomnia, fatigue, difficulty concentrating, anxiety, (Hypo) Manic Symptoms:   denies decreased need for sleep, euphoria Anxiety Symptoms:    mild anxiety  Psychotic Symptoms:  AH of singing, talking, ringing, denies VH PTSD Symptoms: Had a traumatic exposure:  as above Re-experiencing:  None Hypervigilance:  Yes Hyperarousal:  Increased Startle Response Avoidance:  None  Past Psychiatric History:  Outpatient:  Psychiatry admission: for depression in 1990's Previous suicide attempt: denies Past trials of medication:  History of violence: denies History of head injury:   Previous Psychotropic Medications: Yes   Substance Abuse History in the last 12 months:  No.  Consequences of Substance Abuse: Negative  Past Medical History:  Past Medical History:  Diagnosis Date   Adult hypothyroidism 06/16/2013   Anemia    Aortic atherosclerosis (HCC)    Arthritis    Benign hypertension 06/16/2013   Carotid artery disease (HCC)    a.) carotid US 02/10/2020: 1-39% BICA; b.) carotid US 09/22/2021: 1-39% BICA   [name] syndrome    CKD (chronic kidney disease), stage III (HCC)    Coronary artery calcification seen on CT scan    Depression    Esophageal spasm    Esophageal stricture    GERD (gastroesophageal reflux disease)    HBV (hepatitis B virus) infection    High blood pressure    HLD (hyperlipidemia)    Hypothyroidism    LBBB (left bundle branch block)    Lichen planus    Long term current use of antithrombotics/antiplatelets    a.) clopidogrel   Mixed incontinence    OSA on CPAP 06/16/2013   Pneumonia 2023   RLS (restless legs syndrome)    Secondary hyperparathyroidism of renal origin (HCC)    Skin cancer, basal cell    Sleep apnea    Stroke (HCC)    Thyroid disease    Type 2 diabetes mellitus treated with insulin (HCC)     Past Surgical History:  Procedure Laterality Date   CATARACT EXTRACTION Bilateral    COLONOSCOPY  2009   COLONOSCOPY WITH PROPOFOL N/A 11/19/2021   Procedure: COLONOSCOPY WITH PROPOFOL;  Surgeon: Regis Bill, MD;  Location: [facility] ENDOSCOPY;  Service: Endoscopy;   Laterality: N/A;  DM   DILATATION & CURETTAGE/HYSTEROSCOPY WITH MYOSURE  10/14/2021   Procedure: MYOSURE POLYPECTOMY;  Surgeon: Christeen Douglas, MD;  Location: [facility] ORS;  Service: Gynecology;;   ESOPHAGOGASTRODUODENOSCOPY     2009, 2011   ESOPHAGOGASTRODUODENOSCOPY (EGD) WITH PROPOFOL N/A 06/08/2015   Procedure: ESOPHAGOGASTRODUODENOSCOPY (EGD) WITH PROPOFOL;  Surgeon: Wallace Cullens, MD;  Location: [facility] ENDOSCOPY;  Service: Gastroenterology;  Laterality: N/A;   ESOPHAGOGASTRODUODENOSCOPY (EGD) WITH PROPOFOL N/A 11/19/2021   Procedure: ESOPHAGOGASTRODUODENOSCOPY (EGD) WITH PROPOFOL;  Surgeon: Regis Bill, MD;  Location: [facility] ENDOSCOPY;  Service: Endoscopy;  Laterality: N/A;   HEMORRHOID SURGERY     HYSTEROSCOPY WITH D & C N/A 10/14/2021   Procedure: DILATATION AND CURETTAGE /HYSTEROSCOPY;  Surgeon: Christeen Douglas, MD;  Location: [facility] ORS;  Service: Gynecology;  Laterality: N/A;   IMPLANTABLE CONTACT LENS  IMPLANTATION     LYSIS OF ADHESION  10/14/2021   Procedure: LYSIS OF VAGINAL ADHESIONS;  Surgeon: Christeen Douglas, MD;  Location: [facility] ORS;  Service: Gynecology;;   TONSILLECTOMY     TUBAL LIGATION      Family Psychiatric History: as below  Family History:  Family History  Problem Relation Age of Onset   Alcohol abuse Mother    Cancer Mother    Heart disease Father    Kidney failure Maternal Aunt     Social History:   Social History   Socioeconomic History   Marital status: Single    Spouse name: Not on file   Number of children: Not on file   Years of education: Not on file   Highest education level: Some college, no degree  Occupational History   Not on file  Tobacco Use   Smoking status: Former    Types: Cigarettes    Quit date: 07/27/1993    Years since quitting: 28.8   Smokeless tobacco: Never   Tobacco comments:    quit 20 years  Vaping Use   Vaping Use: Never used  Substance and Sexual Activity   Alcohol use: No    Alcohol/week: 0.0 standard drinks of  alcohol   Drug use: No   Sexual activity: Not Currently    Birth control/protection: Post-menopausal  Other Topics Concern   Not on file  Social History Narrative   Lives alone   Social Determinants of Health   Financial Resource Strain: Not on file  Food Insecurity: Not on file  Transportation Needs: Not on file  Physical Activity: Not on file  Stress: Not on file  Social Connections: Not on file    Additional Social History: as above  Allergies:   Allergies  Allergen Reactions   Amoxicillin-Pot Clavulanate Rash   Metformin And Related Nausea And Vomiting   Sulfa Antibiotics Nausea And Vomiting   Hydrocortisone-Iodoquinol  [Hydrocortisone-Iodoquinol] Rash    Patient reports that she does not believe that she has allergy to this, that she was receiving many medications at once.    Metabolic Disorder Labs: Lab Results  Component Value Date   HGBA1C 6.6 (H) 10/27/2021   MPG 142.72 10/27/2021   MPG 145.59 08/11/2021   No results found for: "PROLACTIN" No results found for: "CHOL", "TRIG", "HDL", "CHOLHDL", "VLDL", "LDLCALC" Lab Results  Component Value Date   TSH 0.061 (L) 10/27/2021    Therapeutic Level Labs: No results found for: "LITHIUM" No results found for: "CBMZ" No results found for: "VALPROATE"  Current Medications: Current Outpatient Medications  Medication Sig Dispense Refill   acetaminophen (TYLENOL) 500 MG tablet Take 500 mg by mouth every 6 (six) hours as needed for moderate pain.     buPROPion (WELLBUTRIN XL) 150 MG 24 hr tablet Take 3 tablets (450 mg total) by mouth at bedtime. 30 tablet 0   Cholecalciferol (VITAMIN D3) 50 MCG (2000 UT) TABS Take 2 tablets by mouth daily.     clobetasol ointment (TEMOVATE) 0.05 % APPLY ONE APPLICATION TOPICALLY DAILY. VAGINA 60 g 0   clopidogrel (PLAVIX) 75 MG tablet Take 1 tablet (75 mg total) by mouth at bedtime. 30 tablet 0   docusate sodium (COLACE) 100 MG capsule Take 1 capsule (100 mg total) by mouth daily.  30 capsule 0   ferrous sulfate 325 (65 FE) MG tablet Take 1 tablet (325 mg total) by mouth daily with breakfast. (Patient taking differently: Take 325 mg by mouth daily.) 30 tablet 0  insulin glargine (LANTUS) 100 UNIT/ML injection Inject 0.15 mLs (15 Units total) into the skin at bedtime. (Patient taking differently: Inject 15 Units into the skin daily. morning) 10 mL 0   Insulin Syringe-Needle U-100 (INSULIN SYRINGE 1CC/31GX5/16") 31G X 5/16" 1 ML MISC USE ONE SYRINGE TWICE DAILY AS DIRECTED WITH INSULIN VIALS     levothyroxine (SYNTHROID) 75 MCG tablet Take 1 tablet (75 mcg total) by mouth daily before breakfast. 30 tablet 0   lidocaine-prilocaine (EMLA) cream Apply 1 Application topically as needed. To your vulva for pain control 30 g 0   NOVOLOG 100 UNIT/ML injection Inject 6 Units into the skin in the morning and at bedtime. (Patient taking differently: Inject 4-6 Units into the skin in the morning and at bedtime. 4 units in the morning and 6 units at bedtime) 10 mL 0   nystatin cream (MYCOSTATIN) Apply 1 application topically as needed (yeast).     PARoxetine (PAXIL) 20 MG tablet Take 1 tablet (20 mg total) by mouth at bedtime. 30 tablet 0   rosuvastatin (CRESTOR) 40 MG tablet Take 1 tablet (40 mg total) by mouth at bedtime. 90 tablet 0   terbinafine (LAMISIL) 250 MG tablet Take 250 mg by mouth daily.     torsemide (DEMADEX) 10 MG tablet Take 1 tablet (10 mg total) by mouth every other day. 30 tablet 0   No current facility-administered medications for this visit.    Musculoskeletal: Strength & Muscle Tone: within normal limits Gait & Station: normal Patient leans: N/A  Psychiatric Specialty Exam: Review of Systems  Psychiatric/Behavioral:  Positive for decreased concentration and dysphoric mood. Negative for agitation, behavioral problems, confusion, hallucinations, self-injury, sleep disturbance and suicidal ideas. The patient is nervous/anxious. The patient is not hyperactive.    All other systems reviewed and are negative.   Blood pressure 132/61, pulse 84, temperature (!) 97.5 F (36.4 C), temperature source Skin, height 5' 0.5" (1.537 m), weight 125 lb 9.6 oz (57 kg).Body mass index is 24.13 kg/m.  General Appearance: Fairly Groomed  Eye Contact:  Good  Speech:  Clear and Coherent  Volume:  Normal  Mood:   good  Affect:  Appropriate, Congruent, and Full Range  Thought Process:  Coherent  Orientation:  Full (Time, Place, and Person)  Thought Content:  Logical  Suicidal Thoughts:  No  Homicidal Thoughts:  No  Memory:  Immediate;   Good  Judgement:  Good  Insight:  Good  Psychomotor Activity:  Normal  Concentration:  Concentration: Good and Attention Span: Good  Recall:  Good  Fund of Knowledge:Good  Language: Good  Akathisia:  No  Handed:  Right  AIMS (if indicated):  not done  Assets:  Communication Skills Desire for Improvement  ADL's:  Intact  Cognition: WNL  Sleep:  Fair   Screenings: PHQ2-9    Flowsheet Row Office Visit from 06/06/2022 in [facility]  PHQ-2 Total Score 1  PHQ-9 Total Score 5      Flowsheet Row Admission (Discharged) from 10/14/2021 in [facility] from 08/10/2021 in [facility] at [facility] Pre-Admission Testing 45 from 01/25/2021 in [facility]  C-SSRS RISK CATEGORY No Risk No Risk No Risk       Assessment and Plan:  Abigail Walker is a 77 y.o. year old female with a history of depression, endometrial adenocarcinoma s/p hysterectomy and bilateral SPO, not on chemotherapy or radiation secondary to lichen  planus, right occipital infartcs in 2021, cognitive improvements, type II diabetes, hypertension, hyperlipidemia, LBBB, who is referred for depression.   1. MDD (major depressive disorder), recurrent episode, mild (HCC) Acute stressors include: financial strain in the setting  of buying a car  Other stressors include: absence of nurturing except her maternal grandfather (her mother had alcohol use issues) History: depression since child, originally on Paxil 20 mg daily, bupropion 450 mg daily   Although she reports depressive symptoms in the setting of stressors as above, it has been overall improving since she decided to sell her car.  She reports overall stability in her mood symptoms since being on the current medication regimen.  Will continue Paxil and bupropion to target depression.   2. Neurocognitive deficits  # Hallucinations Functional Status   IADL: Independent in the following: managing finances, medications, driving           Requires assistance with the following:  ADL  Independent in the following: bathing and hygiene, feeding, continence, grooming and toileting, walking          Requires assistance with the following: Folate, Vitamin B12 (not checked), TSH wnl 04/2022 Images Brain: Chronic white matter ischemic change. No evidence of acute infarction, hemorrhage, hydrocephalus, extra-axial collection or mass lesion/mass effect. Neuropsych assessment: SLUMS 27 on 11/2021 Etiology: VaD  Exam is notable for occasional irrelevant thought process (such as suddenly talking about blood sugar when she was asked about Paxil), although this might be appropriate considering her age.  Noted that she reports chronic hallucinations, which could be secondary to neurocognitive disorder.  Will not start antipsychotics at this time given she denies any significant distress from these symptoms.  Will plan to do further evaluation at the next visit.    Plan (she will contact the office if she needs a refill) Continue Paxil 20 mg daily Continue bupropion 450 mg daily   Next appointment: 7/29 at 3 pm for 30 mins, IP Plan to check labs (vitamin B12, folate, vitamin D) at the next visit   The patient demonstrates the following risk factors for suicide: Chronic risk factors  for suicide include: psychiatric disorder of depression . Acute risk factors for suicide include: unemployment and loss (financial, interpersonal, professional). Protective factors for this patient include: coping skills and hope for the future. Considering these factors, the overall suicide risk at this point appears to be low. Patient is appropriate for outpatient follow up.   Collaboration of Care: Other reviewed notes in Epic  Patient/Guardian was advised Release of Information must be obtained prior to any record release in order to collaborate their care with an outside provider. Patient/Guardian was advised if they have not already done so to contact the registration department to sign all necessary forms in order for [place] to release information regarding their care.   Consent: Patient/Guardian gives verbal consent for treatment and assignment of benefits for services provided during this visit. Patient/Guardian expressed understanding and agreed to proceed.   Neysa Hotter, MD 5/20/20242:50 PM

## 2022-06-06 ENCOUNTER — Ambulatory Visit: Payer: Medicare Other | Admitting: Psychiatry

## 2022-06-06 ENCOUNTER — Encounter: Payer: Self-pay | Admitting: Psychiatry

## 2022-06-06 VITALS — BP 132/61 | HR 84 | Temp 97.5°F | Ht 60.5 in | Wt 125.6 lb

## 2022-06-06 DIAGNOSIS — R4189 Other symptoms and signs involving cognitive functions and awareness: Secondary | ICD-10-CM | POA: Diagnosis not present

## 2022-06-06 DIAGNOSIS — R29818 Other symptoms and signs involving the nervous system: Secondary | ICD-10-CM

## 2022-06-06 DIAGNOSIS — F330 Major depressive disorder, recurrent, mild: Secondary | ICD-10-CM | POA: Diagnosis not present

## 2022-06-22 ENCOUNTER — Telehealth: Payer: Self-pay

## 2022-06-22 ENCOUNTER — Other Ambulatory Visit: Payer: Self-pay | Admitting: Psychiatry

## 2022-06-22 MED ORDER — BUPROPION HCL ER (XL) 150 MG PO TB24: 450.0000 mg | ORAL_TABLET | Freq: Every day | ORAL | 1 refills | Status: DC

## 2022-06-22 MED ORDER — PAROXETINE HCL 20 MG PO TABS: 20.0000 mg | ORAL_TABLET | Freq: Every day | ORAL | 1 refills | Status: DC

## 2022-06-22 NOTE — Telephone Encounter (Signed)
pt requesting refill on the paxil and the wellburtin. pt was last seen on 5-20 next appt 7-29

## 2022-06-23 NOTE — Telephone Encounter (Signed)
left patient a message that rxs was sent to the pharmacy

## 2022-07-12 ENCOUNTER — Telehealth: Payer: Self-pay

## 2022-07-12 ENCOUNTER — Other Ambulatory Visit: Payer: Self-pay | Admitting: Psychiatry

## 2022-07-12 MED ORDER — BUPROPION HCL ER (XL) 150 MG PO TB24: 450.0000 mg | ORAL_TABLET | Freq: Every day | ORAL | 0 refills | Status: DC

## 2022-07-12 NOTE — Telephone Encounter (Signed)
pt called states that she needs a 90 day supply of the bupropion 150mg  sent to the [facility] [address].

## 2022-07-12 NOTE — Telephone Encounter (Signed)
Ordered. Could you cancel the monthly bupropion refills? thanks

## 2022-08-15 ENCOUNTER — Ambulatory Visit: Payer: Medicare Other | Admitting: Psychiatry

## 2022-08-27 ENCOUNTER — Emergency Department: Payer: Medicare Other

## 2022-08-27 ENCOUNTER — Other Ambulatory Visit: Payer: Self-pay

## 2022-08-27 ENCOUNTER — Emergency Department
Admission: EM | Admit: 2022-08-27 | Discharge: 2022-08-27 | Disposition: A | Discharge status: Home / Self Care | Payer: Medicare Other | Attending: Emergency Medicine | Admitting: Emergency Medicine

## 2022-08-27 DIAGNOSIS — W19XXXA Unspecified fall, initial encounter: Secondary | ICD-10-CM

## 2022-08-27 DIAGNOSIS — S51012A Laceration without foreign body of left elbow, initial encounter: Secondary | ICD-10-CM | POA: Diagnosis not present

## 2022-08-27 DIAGNOSIS — W07XXXA Fall from chair, initial encounter: Secondary | ICD-10-CM | POA: Insufficient documentation

## 2022-08-27 DIAGNOSIS — S59902A Unspecified injury of left elbow, initial encounter: Secondary | ICD-10-CM | POA: Diagnosis present

## 2022-08-27 MED ORDER — LIDOCAINE-EPINEPHRINE (PF) 2 %-1:200000 IJ SOLN
10.0000 mL | Freq: Once | INTRAMUSCULAR | Status: AC
  Administered 2022-08-27: 10 mL
  Filled 2022-08-27: qty 20

## 2022-08-27 NOTE — ED Notes (Signed)
Skin tear on left elbow noted, home bandage removed and absorbant dressing pad and gauze wrap applied. Bleeding is controlled w/ out pressure at this time.

## 2022-08-27 NOTE — ED Provider Notes (Signed)
[facility] Emergency Department Provider Note     Event Date/Time   First MD Initiated Contact with Patient 08/27/22 1427     (approximate)   History   Fall and Arm Injury   HPI  Abigail Walker is a 77 y.o. female with a history of diabetes, and on blood thinners presents to the ED for a fall today sustaining a left elbow skin tear.  Patient reports she was in between her recliner chair when her shoe fell off and she fell back into the chair hitting her left elbow on a side table. She hasa  approximately 6 cm skin tear to the elbow.   Patient denies head injury or LOC.  She denies vomiting, headache or visual changes.  Patient denies being in pain.  No other complaints at this time.   Physical Exam   Triage Vital Signs: ED Triage Vitals  Encounter Vitals Group     BP 08/27/22 1412 136/88     Systolic BP Percentile --      Diastolic BP Percentile --      Pulse Rate 08/27/22 1412 90     Resp 08/27/22 1412 16     Temp 08/27/22 1412 98.4 F (36.9 C)     Temp Source 08/27/22 1412 Oral     SpO2 08/27/22 1412 100 %     Weight 08/27/22 1408 113 lb (51.3 kg)     Height 08/27/22 1408 5' (1.524 m)     Head Circumference --      Peak Flow --      Pain Score 08/27/22 1408 1     Pain Loc --      Pain Education --      Exclude from Growth Chart --     Most recent vital signs: Vitals:   08/27/22 1412  BP: 136/88  Pulse: 90  Resp: 16  Temp: 98.4 F (36.9 C)  SpO2: 100%    General Awake, no distress.  Well-appearing.  Engaging during evaluation Skin:  Warm, dry and intact. No rashes or lesions noted. ~ 6cm irregular single skin tear to lateral posterior aspect of elbow and proximal forearm.  Ecchymosis noted.   Head:  NCAT.  Nontender to palpation. Eyes:  PERRLA. EOMI. . Neck:   No cervical spine tenderness to palpation.  CV:  Good peripheral perfusion. RRR.  RESP:  Normal effort. LCTAB.  BACK:  Spinous process is midline without deformity or  tenderness. MSK:   Full ROM in all joints. No swelling, deformity or tenderness.  Gait is steady. NEURO: Cranial nerves intact. No focal deficits. Sensation and motor function intact.   ED Results / Procedures / Treatments   Labs (all labs ordered are listed, but only abnormal results are displayed) Labs Reviewed - No data to display  RADIOLOGY  I personally viewed and evaluated these images as part of my medical decision making, as well as reviewing the written report by the radiologist.  ED Provider Interpretation: Left humerus externally is unremarkable  DG Humerus Left  Result Date: 08/27/2022 CLINICAL DATA:  Fall onto arm EXAM: LEFT HUMERUS - 2+ VIEW COMPARISON:  None Available. FINDINGS: There is no acute fracture or dislocation. Shoulder and elbow alignment appear maintained. The joint spaces appear preserved. There is no erosive change. The soft tissues are unremarkable. IMPRESSION: Negative. Electronically Signed   By: Lesia Hausen M.D.   On: 08/27/2022 16:04    PROCEDURES:  Critical Care performed: No  ..Laceration Repair  Date/Time: 08/27/2022 6:25 PM  Performed by: Kern Reap A, PA-C Authorized by: Conrad Braddock, PA-C   Consent:    Consent obtained:  Verbal   Consent given by:  Patient   Risks discussed:  Infection, pain and poor wound healing Universal protocol:    Patient identity confirmed:  Verbally with patient Anesthesia:    Anesthesia method:  Local infiltration   Local anesthetic:  Lidocaine 2% WITH epi Laceration details:    Location:  Shoulder/arm   Shoulder/arm location:  L elbow   Length (cm):  6 Treatment:    Area cleansed with:  Povidone-iodine   Amount of cleaning:  Standard   Irrigation solution:  Sterile saline   Irrigation method:  Syringe Skin repair:    Repair method:  Sutures   Suture size:  5-0   Suture material:  Nylon   Suture technique:  Simple interrupted   Number of sutures:  11 Repair type:    Repair type:   Simple Post-procedure details:    Dressing:  Non-adherent dressing   Procedure completion:  Tolerated    MEDICATIONS ORDERED IN ED: Medications  lidocaine-EPINEPHrine (XYLOCAINE W/EPI) 2 %-1:200000 (PF) injection 10 mL (10 mLs Infiltration Given 08/27/22 1834)   IMPRESSION / MDM / ASSESSMENT AND PLAN / ED COURSE  I reviewed the triage vital signs and the nursing notes.                               77 y.o. female presents to the emergency department for evaluation and treatment of acute skin tear. See HPI for further details.   Differential diagnosis includes, but is not limited to skin tear, laceration, fracture, dislocation.  Patient's presentation is most consistent with acute complicated illness / injury requiring diagnostic workup.  Patient is well-appearing and talkative during evaluation.  She is alert and oriented.  There is no visible deformity to elbow over skin tear.  There is a hematoma over proximal humerus.  Skin tear repaired.  Please see procedure note.  Patient tolerated laceration repair.  Recommended return for suture removal in 10 to 14 days.  Gauze and Coban wrap is provided for patient for daily dressing changes.  Instructed to keep area clean and to apply antibiotic ointment on day 2.  Signs of infections were discussed with patient.  Patient verbalized understanding.  All questions and concerns were addressed during ED visit.  FINAL CLINICAL IMPRESSION(S) / ED DIAGNOSES   Final diagnoses:  Skin tear of elbow without complication, left, initial encounter  Fall, initial encounter     Rx / DC Orders   ED Discharge Orders     None        Note:  This document was prepared using Dragon voice recognition software and may include unintentional dictation errors.    Romeo Apple,  A, PA-C 08/27/22 1845    Sharyn Creamer, MD 08/28/22 1428

## 2022-08-27 NOTE — Discharge Instructions (Addendum)
Keep sutures dry for first 24 hrs. Keep suture site clean & dry. Gently use soap & water after first 24 hrs. DO NOT USE alcohol, hydrogen peroxide etc, to clean skin. You may cover the incision with clean gauze & replace it after your daily shower for your comfort.   Apply antibiotic ointment to area after first 24 hours.  The dressing change is recommended.  Return to your primary care or urgent care or this ED for suture removal in 10 days.  Closely monitor for infection signs that we briefly discussed.  Take Tylenol for pain as needed. It was a pleasure treating you. Take care!

## 2022-08-27 NOTE — ED Notes (Signed)
Patient was taken to imaging. 

## 2022-08-27 NOTE — ED Triage Notes (Signed)
Pt reports tripped and fell today and cut her left elbow and needs it cleaned and closed up. Denies hitting head, LOC, or other injuries.

## 2022-08-29 ENCOUNTER — Other Ambulatory Visit: Payer: Self-pay | Admitting: Psychiatry

## 2022-09-21 ENCOUNTER — Other Ambulatory Visit (INDEPENDENT_AMBULATORY_CARE_PROVIDER_SITE_OTHER): Payer: Self-pay | Admitting: Nurse Practitioner

## 2022-09-21 ENCOUNTER — Inpatient Hospital Stay: Payer: Medicare Other | Attending: Obstetrics and Gynecology | Admitting: Obstetrics and Gynecology

## 2022-09-21 VITALS — BP 135/61 | HR 85 | Temp 97.6°F | Resp 20 | Wt 125.0 lb

## 2022-09-21 DIAGNOSIS — Z08 Encounter for follow-up examination after completed treatment for malignant neoplasm: Secondary | ICD-10-CM | POA: Insufficient documentation

## 2022-09-21 DIAGNOSIS — Z90722 Acquired absence of ovaries, bilateral: Secondary | ICD-10-CM | POA: Diagnosis not present

## 2022-09-21 DIAGNOSIS — Z8542 Personal history of malignant neoplasm of other parts of uterus: Secondary | ICD-10-CM | POA: Diagnosis not present

## 2022-09-21 DIAGNOSIS — I6523 Occlusion and stenosis of bilateral carotid arteries: Secondary | ICD-10-CM

## 2022-09-21 DIAGNOSIS — Z90710 Acquired absence of both cervix and uterus: Secondary | ICD-10-CM | POA: Insufficient documentation

## 2022-09-21 NOTE — Progress Notes (Signed)
Gynecologic Oncology Interval Visit   Referring Provider: Dr. Dalbert Garnet  Chief Complaint: endometrial adenocarcinoma  Subjective:  Abigail Walker is a 77 y.o. female G1P1 who is seen in consultation from Dr. Dareen Piano for new diagnosis of endometrial adenocarcinoma.  Patient returns today for surveillance.  No new gyn complaints.   Did not have vaginal brachy because vagina too stenotic to accommodate mold.  Uses narrow dilator occasionally.  Gynecologic Oncology History Abigail Walker is a pleasant patient G1P1 who is seen in consultation from Dr. Dareen Piano for new diagnosis of endometrial adenocarcinoma.  Seen for annual exam in May 2022, mentioned PMB. Pap was unable to be collected due to lichen, loss of architecture, and stenosis. D&C and EUA was recommended. She returns 12/22 for surgical eval but did not show for ultrasound.   08/10/21- CT Angio Chest/Abd/Pelvis Uterus and adnexa were unremarkable. No lymphadenopathy. No ascites.  5 mm solid nodule in RML of lung.   September 2023 Re-presented and  underwent D&C, EUA, myosure polypectomy, lysis of vaginal adhesions with Dr. Dalbert Garnet 10/14/21.   DIAGNOSIS:  A. ENDOMETRIAL CURETTINGS:  - ENDOMETRIAL ADENOCARCINOMA, FIGO 1.  - CHRONIC ENDOMETRITIS   She has been using topical estrogen since surgery. She applies clobestasol and vaseline jelly on the external vulva.   On 12/13/2021 she underwent Robot assisted total laparoscopic hysterectomy, bilateral salpingo-oophorectomy, bilateral sentinel lymph node mapping and biopsies.   Pathology:  DIAGNOSIS A. Left external iliac sentinel lymph node, sentinel lymph node biopsy:  One lymph node, negative for malignancy (0/1).  B. Right external iliac sentinel lymph node, sentinel lymph node biopsy:  One lymph node, negative for malignancy (0/1).  C. Uterus, bilateral ovaries and fallopian tubes, hysterectomy and bilateral salpingo-oophorectomy:  Uterus: Endometrioid adenocarcinoma of  the endometrium (4.0 cm), FIGO grade 1 of 3, invading 11 mm in a 14 mm thick myometrium, with focal microcystic elongated fragmented pattern of invasion (MELF). Negative for lymphovascular space invasion.  Remaining myometrium: No pathologic diagnosis. Cervix: Negative for malignancy. Serosa: Negative for malignancy.  Microsatellite instability testing and mismatch repair protein immunohistochemistry: Pending, to be reported separately from the molecular diagnostics laboratory.  P53 immunohistochemistry: Pending, to be reported in an addendum.  See synoptic report below.  Right adnexa: Ovary and fallopian tube (status post tubal ligation) with no pathologic diagnosis. Negative for malignancy.  Left adnexa: Ovary and fallopian tube (status post tubal ligation) with no pathologic diagnosis. Negative for malignancy.  Synoptic Report ENDOMETRIUM 8th Edition - Protocol posted: 12/14/2022ENDOMETRIUM: HYSTERECTOMY - All Specimens SPECIMEN Procedure Total hysterectomy and bilateral salpingo-oophorectomy Specimen Integrity Intact TUMOR Tumor Site Endometrium Tumor Size Greatest Dimension (Centimeters): 4.0 cm Histologic Type Endometrioid carcinoma, NOS Histologic Grade FIGO grade 1 Two-Tier Grading System Low grade (encompassing FIGO 1 and 2) Myometrial Invasion Present Depth of Myometrial Invasion 11 mm Myometrial Thickness 14 mm Percentage of Myometrial Invasion Estimated to be 50% or greater Adenomyosis Not identified Uterine Serosa Involvement Not identified Lower Uterine Segment Involvement Present, myoinvasive Cervical Stromal Involvement Not identified Other Tissue / Organ Involvement Not identified Lymphatic and / or Vascular Invasion Not identified  Mismatch repair: INTACT Immunohistochemistry: Normal result. Expression of MLH1, MSH2, MSH6, and PMS2 is retained in the neoplastic cells. PCR: Microsatellite stable (MSS). Recommendation from Tumor Board at [facility] was for  VBT (meets PORTEC and GOG 99 criteria).   History of occipital stroke in 2021, cognitive impairment, falls, major depression, Charlie bonnet syndrome/hallucinations, diabetes, MUI s/p percutaneous tibial nerve stimulation 2016-2017.  She also has multiple other medical  problems as noted below.    VTE Risk: No known h/o VTE or family h/o VTE   Problem List: Patient Active Problem List   Diagnosis Date Noted   International Federation of Gynecology and Obstetrics (FIGO) malignant neoplasm of endometrium stage IB (HCC) 01/19/2022   Status post surgery 10/14/2021   Aspiration pneumonia (HCC) 08/11/2021   CAP (community acquired pneumonia) 08/10/2021   Chest pain 08/10/2021   Dysphagia 08/10/2021   Type 2 diabetes mellitus with peripheral neuropathy (HCC) 08/10/2021   PAD (peripheral artery disease) (HCC) 02/22/2020   Hypoglycemia due to type 2 diabetes mellitus (HCC)    Hypothermia    Dehydration    Hypoglycemia 01/09/2020   Moderate protein-calorie malnutrition (HCC) 01/08/2020   Fall    Hyponatremia    Hyperglycemia 12/05/2019   Acute lower UTI 12/05/2019   Depression    Lumbar compression fracture (HCC)    Weakness of left foot 09/14/2019   Hyperlipidemia 08/13/2019   Hypercalcemia 05/01/2019   Leukopenia 01/17/2019   Stage 3 chronic kidney disease (HCC) 01/17/2019   Benign hypertensive kidney disease with chronic kidney disease 01/16/2019   Hyposmolality and/or hyponatremia 01/16/2019   Secondary hyperparathyroidism of renal origin (HCC) 01/16/2019   Carotid artery disease (HCC) 08/10/2017   Chronic midline low back pain without sciatica 03/16/2017   Primary osteoarthritis of both hands 01/16/2017   Benign essential tremor 08/12/2015   MGUS (monoclonal gammopathy of unknown significance) 07/08/2015   Mixed incontinence 11/09/2014   Atrophic vaginitis 07/28/2014   Encounter for general adult medical examination without abnormal findings 05/28/2014   Major depression in  remission (HCC) 11/27/2013   Morbid obesity (HCC) 11/16/2013   Microalbuminuria 08/06/2013   Breath shortness 08/02/2013   Acid reflux 06/16/2013   Benign hypertension 06/16/2013   Type 2 diabetes mellitus (HCC) 06/16/2013   Hypothyroidism 06/16/2013   Obstructive apnea 06/16/2013   Hyperlipidemia associated with type 2 diabetes mellitus (HCC) 19/14/7829   Oral lichen planus 05/10/2011   Lichen planus 11/09/2010    Past Medical History: Past Medical History:  Diagnosis Date   Adult hypothyroidism 06/16/2013   Anemia    Aortic atherosclerosis (HCC)    Arthritis    Benign hypertension 06/16/2013   Carotid artery disease (HCC)    a.) carotid US 02/10/2020: 1-39% BICA; b.) carotid US 09/22/2021: 1-39% BICA   [name] syndrome    CKD (chronic kidney disease), stage III (HCC)    Coronary artery calcification seen on CT scan    Depression    Esophageal spasm    Esophageal stricture    GERD (gastroesophageal reflux disease)    HBV (hepatitis B virus) infection    High blood pressure    HLD (hyperlipidemia)    Hypothyroidism    LBBB (left bundle branch block)    Lichen planus    Long term current use of antithrombotics/antiplatelets    a.) clopidogrel   Mixed incontinence    OSA on CPAP 06/16/2013   Pneumonia 2023   RLS (restless legs syndrome)    Secondary hyperparathyroidism of renal origin (HCC)    Skin cancer, basal cell    Sleep apnea    Stroke (HCC)    Thyroid disease    Type 2 diabetes mellitus treated with insulin (HCC)     Past Surgical History: Past Surgical History:  Procedure Laterality Date   CATARACT EXTRACTION Bilateral    COLONOSCOPY  2009   COLONOSCOPY WITH PROPOFOL N/A 11/19/2021   Procedure: COLONOSCOPY WITH PROPOFOL;  Surgeon: Eather Colas  T, MD;  Location: [facility] ENDOSCOPY;  Service: Endoscopy;  Laterality: N/A;  DM   DILATATION & CURETTAGE/HYSTEROSCOPY WITH MYOSURE  10/14/2021   Procedure: MYOSURE POLYPECTOMY;  Surgeon: Christeen Douglas,  MD;  Location: [facility] ORS;  Service: Gynecology;;   ESOPHAGOGASTRODUODENOSCOPY     2009, 2011   ESOPHAGOGASTRODUODENOSCOPY (EGD) WITH PROPOFOL N/A 06/08/2015   Procedure: ESOPHAGOGASTRODUODENOSCOPY (EGD) WITH PROPOFOL;  Surgeon: Wallace Cullens, MD;  Location: [facility] ENDOSCOPY;  Service: Gastroenterology;  Laterality: N/A;   ESOPHAGOGASTRODUODENOSCOPY (EGD) WITH PROPOFOL N/A 11/19/2021   Procedure: ESOPHAGOGASTRODUODENOSCOPY (EGD) WITH PROPOFOL;  Surgeon: Regis Bill, MD;  Location: [facility] ENDOSCOPY;  Service: Endoscopy;  Laterality: N/A;   HEMORRHOID SURGERY     HYSTEROSCOPY WITH D & C N/A 10/14/2021   Procedure: DILATATION AND CURETTAGE /HYSTEROSCOPY;  Surgeon: Christeen Douglas, MD;  Location: [facility] ORS;  Service: Gynecology;  Laterality: N/A;   IMPLANTABLE CONTACT LENS IMPLANTATION     LYSIS OF ADHESION  10/14/2021   Procedure: LYSIS OF VAGINAL ADHESIONS;  Surgeon: Christeen Douglas, MD;  Location: [facility] ORS;  Service: Gynecology;;   TONSILLECTOMY     TUBAL LIGATION      Past Gynecologic History:  Menarche: age 63 Post menopausal   OB History:  OB History  No obstetric history on file.  G1P1- vaginal delivery - her daughter has deceased  Family History: Family History  Problem Relation Age of Onset   Alcohol abuse Mother    Cancer Mother    Heart disease Father    Kidney failure Maternal Aunt     Social History: Social History   Socioeconomic History   Marital status: Single    Spouse name: Not on file   Number of children: Not on file   Years of education: Not on file   Highest education level: Some college, no degree  Occupational History   Not on file  Tobacco Use   Smoking status: Former    Current packs/day: 0.00    Types: Cigarettes    Quit date: 07/27/1993    Years since quitting: 29.1   Smokeless tobacco: Never   Tobacco comments:    quit 20 years  Vaping Use   Vaping status: Never Used  Substance and Sexual Activity   Alcohol use: No    Alcohol/week: 0.0  standard drinks of alcohol   Drug use: No   Sexual activity: Not Currently    Birth control/protection: Post-menopausal  Other Topics Concern   Not on file  Social History Narrative   Lives alone   Social Determinants of Health   Financial Resource Strain: Medium Risk (06/07/2022)   Received from [facility], [facility]   Overall Financial Resource Strain (CARDIA)    Difficulty of Paying Living Expenses: Somewhat hard  Food Insecurity: No Food Insecurity (06/07/2022)   Received from [facility], [facility]   Hunger Vital Sign    Worried About Running Out of Food in the Last Year: Never true    Ran Out of Food in the Last Year: Never true  Transportation Needs: No Transportation Needs (06/07/2022)   Received from [facility], [facility]   [facility] - Transportation    In the past 12 months, has lack of transportation kept you from medical appointments or from getting medications?: No    Lack of Transportation (Non-Medical): No  Physical Activity: Inactive (12/01/2021)   Received from [facility], [facility]   Exercise Vital  Sign    Days of Exercise per Week: 0 days    Minutes of Exercise per Session: 0 min  Stress: Not on file  Social Connections: Socially Isolated (12/01/2021)   [facility], [facility]   Social Connection and Isolation Panel [NHANES]    Frequency of Communication with Friends and Family: Never    Frequency of Social Gatherings with Friends and Family: Never    Attends Religious Services: Never    Database administrator or Organizations: No    Attends Banker Meetings: Never    Marital Status: Divorced  Catering manager Violence: Not on file    Allergies: Allergies  Allergen Reactions   Amoxicillin-Pot Clavulanate Rash   Metformin And Related Nausea  And Vomiting   Sulfa Antibiotics Nausea And Vomiting   Hydrocortisone-Iodoquinol  [Hydrocortisone-Iodoquinol] Rash    Patient reports that she does not believe that she has allergy to this, that she was receiving many medications at once.    Current Medications: Current Outpatient Medications  Medication Sig Dispense Refill   acetaminophen (TYLENOL) 500 MG tablet Take 500 mg by mouth every 6 (six) hours as needed for moderate pain.     buPROPion (WELLBUTRIN XL) 150 MG 24 hr tablet Take 3 tablets (450 mg total) by mouth daily. 90 tablet 1   buPROPion (WELLBUTRIN XL) 150 MG 24 hr tablet Take 3 tablets (450 mg total) by mouth daily. 270 tablet 0   Cholecalciferol (VITAMIN D3) 50 MCG (2000 UT) TABS Take 2 tablets by mouth daily.     clobetasol ointment (TEMOVATE) 0.05 % APPLY ONE APPLICATION TOPICALLY DAILY. VAGINA 60 g 0   clopidogrel (PLAVIX) 75 MG tablet Take 1 tablet (75 mg total) by mouth at bedtime. 30 tablet 0   docusate sodium (COLACE) 100 MG capsule Take 1 capsule (100 mg total) by mouth daily. 30 capsule 0   empagliflozin (JARDIANCE) 25 MG TABS tablet Take by mouth.     ferrous sulfate 325 (65 FE) MG tablet Take 1 tablet (325 mg total) by mouth daily with breakfast. (Patient taking differently: Take 325 mg by mouth daily.) 30 tablet 0   insulin glargine (LANTUS) 100 UNIT/ML injection Inject 0.15 mLs (15 Units total) into the skin at bedtime. (Patient taking differently: Inject 15 Units into the skin daily. morning) 10 mL 0   Insulin Syringe-Needle U-100 (INSULIN SYRINGE 1CC/31GX5/16") 31G X 5/16" 1 ML MISC USE ONE SYRINGE TWICE DAILY AS DIRECTED WITH INSULIN VIALS     levothyroxine (SYNTHROID) 75 MCG tablet Take 1 tablet (75 mcg total) by mouth daily before breakfast. 30 tablet 0   lidocaine-prilocaine (EMLA) cream Apply 1 Application topically as needed. To your vulva for pain control 30 g 0   NOVOLOG 100 UNIT/ML injection Inject 6 Units into the skin in the morning and at bedtime.  (Patient taking differently: Inject 4-6 Units into the skin in the morning and at bedtime. 4 units in the morning and 6 units at bedtime) 10 mL 0   nystatin cream (MYCOSTATIN) Apply 1 application topically as needed (yeast).     PARoxetine (PAXIL) 20 MG tablet Take 1 tablet (20 mg total) by mouth daily. 90 tablet 0   rosuvastatin (CRESTOR) 40 MG tablet Take 1 tablet (40 mg total) by mouth at bedtime. 90 tablet 0   terbinafine (LAMISIL) 250 MG tablet Take 250 mg by mouth daily.     torsemide (DEMADEX) 10 MG tablet Take 1 tablet (10 mg total)  by mouth every other day. 30 tablet 0   No current facility-administered medications for this visit.      Objective:  Physical Examination:  BP 135/61   Pulse 85   Temp 97.6 F (36.4 C)   Resp 20   Wt 125 lb (56.7 kg)   SpO2 100%   BMI 24.41 kg/m     Performance status: 1  GENERAL: Patient is a elderly appearing female in no acute distress HEENT:  Atraumatic and normocephalic. PERRL, neck supple. NODES:  No cervical, supraclavicular, axillary, or inguinal lymphadenopathy palpated.  LUNGS:  Clear to auscultation bilaterally.   HEART:  Regular rate and rhythm.  ABDOMEN:  Soft, nontender. Nondistended. No masses/ascites/hernia/or hepatomegaly.  EXTREMITIES:  No peripheral edema.   SKIN:  Clear with no obvious rashes or skin changes. No nail dyscrasia. NEURO:  Nonfocal. Well oriented.  Appropriate affect.  Pelvic: EGBUS: no lesions; very atrophic erythematous vulva secondary to lichen planus Cervix: surgically absent Vagina: no lesions, no discharge or bleeding; narrow caliber atrophic vagina with agglutination 4 cm Uterus: surgically absent BME: no palpable masses Rectovaginal: deferred    Lab Review  No labs on site today.     Assessment:  Abigail Walker is a 77 y.o. female diagnosed 11/23 with stage Ib endometrioid endometrial cancer grade 1 with HIR based on age and deep invasion (pMMR,MSS).  On 12/13/2021 she underwent robot  assisted total laparoscopic hysterectomy, bilateral salpingo-oophorectomy, bilateral sentinel lymph node mapping and biopsies.  Endometrioid adenocarcinoma of the endometrium (4.0 cm), FIGO grade 1 of 3, invading 11 mm in a 14 mm thick myometrium, with focal microcystic elongated fragmented pattern of invasion (MELF). Negative for lymphovascular space invasion.  Adjuvant vaginal brachytherapy recommended, but patient has lichen planus and vagina would not accommodate vaginal mold. She refuses pelvic today, but no discharge or bleeding.   Medical co-morbidities complicating care: Aspiration pneumonia/CAP (community acquired pneumonia); significant dysphagia; Type 2 diabetes mellitus with peripheral neuropathy; PAD (peripheral artery disease)/Carotid artery disease; Moderate protein-calorie malnutrition; Stage 3 chronic kidney disease; Benign hypertensive kidney disease with chronic kidney disease; MGUS (monoclonal gammopathy of unknown significance); Hypothyroidism; Obstructive apnea (Dx'd 06/16/2013 now resolved); and left bundle branch block with left axis deviation Plan:   Problem List Items Addressed This Visit   None Visit Diagnoses     Encounter for follow-up surveillance of endometrial cancer    -  Primary      She has chronic lichen planus which causes recurrent vaginal agglutination. Uses narrow vaginal dilator occasionally.    We will [facility] to [facility] for [facility] visit at her next appointment, 4 months  Suggested return to clinic in 8 months. She declined alternating with Dr. Dalbert Garnet. She is aware that if she is NED in five years we will refer her to Dr. Dalbert Garnet for continued follow up.    The patient's diagnosis, an outline of the further diagnostic and laboratory studies which will be required, the recommendation for surgery, and alternatives were discussed with her.  All questions were answered to their satisfaction.  I personally interviewed and examined the patient.  Agreed with the above/below plan of care. I have directly contributed to assessment and plan of care of this patient and educated and discussed with patient and family.   I personally had a face to face interaction and evaluated the patient jointly with the NP, Ms. Consuello Masse.  I have reviewed her history and available records and have performed the key portions of the physical exam  including lymph node survey, abdominal exam, pelvic exam with my findings confirming those documented above by the APP.  I have discussed the case with the APP and the patient.  I agree with the above documentation, assessment and plan which was fully formulated by me.  Counseling was completed by me.   I personally saw the patient and performed a substantive portion of this encounter in conjunction with the listed APP as documented above.  Taffie Eckmann Leta Jungling, MD   [facility] Leta Jungling, MD    CC:   Christeen Douglas, MD [address] [place],  [place] [address] [phone]

## 2022-09-21 NOTE — Progress Notes (Signed)
MRN : [id number]  Abigail Walker is a 77 y.o. (May 08, 1945) female who presents with chief complaint of check carotid arteries.  History of Present Illness:   The patient is seen for follow up evaluation of carotid stenosis. The carotid stenosis followed by ultrasound.    The patient denies amaurosis fugax. There is no recent history of TIA symptoms or focal motor deficits. There is no prior documented CVA.   The patient is taking enteric-coated aspirin 81 mg daily.   There is no history of migraine headaches. There is no history of seizures.   No recent shortening of the patient's walking distance or new symptoms consistent with claudication.  No history of rest pain symptoms. No new ulcers or wounds of the lower extremities have occurred.   There is no history of DVT, PE or superficial thrombophlebitis. No recent episodes of angina or shortness of breath documented.    Carotid Duplex done today (09/22/2022) shows 1 to 39% stenosis bilaterally.  No change compared to last study in 09/22/2021  No outpatient medications have been marked as taking for the 09/22/22 encounter (Appointment) with Gilda Crease, Latina Craver, MD.    Past Medical History:  Diagnosis Date   Adult hypothyroidism 06/16/2013   Anemia    Aortic atherosclerosis (HCC)    Arthritis    Benign hypertension 06/16/2013   Carotid artery disease (HCC)    a.) carotid US 02/10/2020: 1-39% BICA; b.) carotid US 09/22/2021: 1-39% BICA   [name] syndrome    CKD (chronic kidney disease), stage III (HCC)    Coronary artery calcification seen on CT scan    Depression    Esophageal spasm    Esophageal stricture    GERD (gastroesophageal reflux disease)    HBV (hepatitis B virus) infection    High blood pressure    HLD (hyperlipidemia)    Hypothyroidism    LBBB (left bundle branch block)    Lichen planus    Long term current use of antithrombotics/antiplatelets    a.) clopidogrel   Mixed incontinence     OSA on CPAP 06/16/2013   Pneumonia 2023   RLS (restless legs syndrome)    Secondary hyperparathyroidism of renal origin (HCC)    Skin cancer, basal cell    Sleep apnea    Stroke (HCC)    Thyroid disease    Type 2 diabetes mellitus treated with insulin (HCC)     Past Surgical History:  Procedure Laterality Date   CATARACT EXTRACTION Bilateral    COLONOSCOPY  2009   COLONOSCOPY WITH PROPOFOL N/A 11/19/2021   Procedure: COLONOSCOPY WITH PROPOFOL;  Surgeon: Regis Bill, MD;  Location: [facility];  Service: Endoscopy;  Laterality: N/A;  DM   DILATATION & CURETTAGE/HYSTEROSCOPY WITH MYOSURE  10/14/2021   Procedure: MYOSURE POLYPECTOMY;  Surgeon: Christeen Douglas, MD;  Location: [facility];  Service: Gynecology;;   ESOPHAGOGASTRODUODENOSCOPY     2009, 2011   ESOPHAGOGASTRODUODENOSCOPY (EGD) WITH PROPOFOL N/A 06/08/2015   Procedure: ESOPHAGOGASTRODUODENOSCOPY (EGD) WITH PROPOFOL;  Surgeon: Wallace Cullens, MD;  Location: [facility] ENDOSCOPY;  Service: Gastroenterology;  Laterality: N/A;   ESOPHAGOGASTRODUODENOSCOPY (EGD) WITH PROPOFOL N/A 11/19/2021   Procedure: ESOPHAGOGASTRODUODENOSCOPY (EGD) WITH PROPOFOL;  Surgeon: Regis Bill, MD;  Location: [facility] END[facility];  Service: Endoscopy;  Laterality: N/A;   HEMORRHOID SURGERY     HYSTEROSCOPY WITH D & C N/A 10/14/2021  Procedure: DILATATION AND CURETTAGE /HYSTEROSCOPY;  Surgeon: Christeen Douglas, MD;  Location: [facility];  Service: Gynecology;  Laterality: N/A;   IMPLANTABLE CONTACT LENS IMPLANTATION     LYSIS OF ADHESION  10/14/2021   Procedure: LYSIS OF VAGINAL ADHESIONS;  Surgeon: Christeen Douglas, MD;  Location: [facility] OR[facility];  Service: Gynecology;;   TONSILLECTOMY     TUBAL LIGATION      Social History Social History   Tobacco Use   Smoking status: Former    Current packs/day: 0.00    Types: Cigarettes    Quit date: 07/27/1993    Years since quitting: 29.1   Smokeless tobacco: Never   Tobacco comments:    quit 20 years  Vaping  Use   Vaping status: Never Used  Substance Use Topics   Alcohol use: No    Alcohol/week: 0.0 standard drinks of alcohol   Drug use: No    Family History Family History  Problem Relation Age of Onset   Alcohol abuse Mother    Cancer Mother    Heart disease Father    Kidney failure Maternal Aunt     Allergies  Allergen Reactions   Amoxicillin-Pot Clavulanate Rash   Metformin And Related Nausea And Vomiting   Sulfa Antibiotics Nausea And Vomiting   Hydrocortisone-Iodoquinol  [Hydrocortisone-Iodoquinol] Rash    Patient reports that she does not believe that she has allergy to this, that she was receiving many medications at once.     REVIEW OF SYSTEMS (Negative unless checked)  Constitutional: [] Weight loss  [] Fever  [] Chills Cardiac: [] Chest pain   [] Chest pressure   [] Palpitations   [] Shortness of breath when laying flat   [] Shortness of breath with exertion. Vascular:  [x] Pain in legs with walking   [] Pain in legs at rest  [] History of DVT   [] Phlebitis   [] Swelling in legs   [] Varicose veins   [] Non-healing ulcers Pulmonary:   [] Uses home oxygen   [] Productive cough   [] Hemoptysis   [] Wheeze  [] COPD   [] Asthma Neurologic:  [] Dizziness   [] Seizures   [] History of stroke   [] History of TIA  [] Aphasia   [] Vissual changes   [] Weakness or numbness in arm   [] Weakness or numbness in leg Musculoskeletal:   [] Joint swelling   [] Joint pain   [] Low back pain Hematologic:  [] Easy bruising  [] Easy bleeding   [] Hypercoagulable state   [] Anemic Gastrointestinal:  [] Diarrhea   [] Vomiting  [x] Gastroesophageal reflux/heartburn   [] Difficulty swallowing. Genitourinary:  [x] Chronic kidney disease   [] Difficult urination  [] Frequent urination   [] Blood in urine Skin:  [] Rashes   [] Ulcers  Psychological:  [] History of anxiety   []  History of major depression.  Physical Examination  There were no vitals filed for this visit. There is no height or weight on file to calculate BMI. Gen: WD/WN,  NAD Head: [place]/AT, No temporalis wasting.  Ear/Nose/Throat: Hearing grossly intact, nares w/o erythema or drainage Eyes: PER, EOMI, sclera nonicteric.  Neck: Supple, no masses.  No bruit or JVD.  Pulmonary:  Good air movement, no audible wheezing, no use of accessory muscles.  Cardiac: RRR, normal S1, S2, no Murmurs. Vascular:  carotid bruit noted Vessel Right Left  Radial Palpable Palpable  Carotid  Palpable  Palpable  Gastrointestinal: soft, non-distended. No guarding/no peritoneal signs.  Musculoskeletal: M/S 5/5 throughout.  No visible deformity.  Neurologic: CN 2-12 intact. Pain and light touch intact in extremities.  Symmetrical.  Speech is fluent. Motor exam as listed above. Psychiatric: Judgment intact, Mood &  affect appropriate for pt's clinical situation. Dermatologic: No rashes or ulcers noted.  No changes consistent with cellulitis.   CBC Lab Results  Component Value Date   WBC 2.7 (L) 10/27/2021   HGB 12.7 10/27/2021   HCT 38.6 10/27/2021   MCV 91.3 10/27/2021   PLT 153 10/27/2021    BMET    Component Value Date/Time   NA 139 10/27/2021 1526   NA 140 05/19/2011 0409   K 4.4 10/27/2021 1526   K 4.2 05/19/2011 0409   CL 105 10/27/2021 1526   CL 105 05/19/2011 0409   CO2 29 10/27/2021 1526   CO2 28 05/19/2011 0409   GLUCOSE 151 (H) 10/27/2021 1526   GLUCOSE 158 (H) 05/19/2011 0409   BUN 22 10/27/2021 1526   BUN 15 05/19/2011 0409   CREATININE 1.28 (H) 10/27/2021 1526   CREATININE 1.18 05/19/2011 0409   CALCIUM 9.3 10/27/2021 1526   CALCIUM 9.2 05/19/2011 0409   GFRNONAA 43 (L) 10/27/2021 1526   GFRNONAA 48 (L) 05/19/2011 0409   GFRAA 37 (L) 01/09/2019 1432   GFRAA 56 (L) 05/19/2011 0409   CrCl cannot be calculated (Patient's most recent lab result is older than the maximum 21 days allowed.).  COAG Lab Results  Component Value Date   INR 0.98 08/14/2014    Radiology DG Humerus Left  Result Date: 08/27/2022 CLINICAL DATA:  Fall onto arm EXAM: LEFT  HUMERUS - 2+ VIEW COMPARISON:  None Available. FINDINGS: There is no acute fracture or dislocation. Shoulder and elbow alignment appear maintained. The joint spaces appear preserved. There is no erosive change. The soft tissues are unremarkable. IMPRESSION: Negative. Electronically Signed   By: Lesia Hausen M.D.   On: 08/27/2022 16:04     Assessment/Plan 1. Bilateral carotid artery stenosis Recommend:   Given the patient's asymptomatic subcritical stenosis no further invasive testing or surgery at this time.   Duplex ultrasound shows 1-39% stenosis bilaterally.   Continue antiplatelet therapy as prescribed Continue management of CAD, HTN and Hyperlipidemia Healthy heart diet,  encouraged exercise at least 4 times per week Follow up in 12 months with duplex ultrasound and physical exam   - VAS US CAROTID; Future  2. PAD (peripheral artery disease) (HCC) Recommend:  I do not find evidence of life style limiting vascular disease. The patient specifically denies life style limitation.  Previous noninvasive studies including ABI's of the legs do not identify critical vascular problems.  The patient should continue walking and begin a more formal exercise program. The patient should continue his antiplatelet therapy and aggressive treatment of the lipid abnormalities.  The patient is instructed to call the office if there is a significant change in the lower extremity symptoms, particularly if a wound develops or there is an abrupt increase in leg pain.  3. Benign hypertension Continue antihypertensive medications as already ordered, these medications have been reviewed and there are no changes at this time.  4. Type 2 diabetes mellitus with diabetic peripheral angiopathy without gangrene, unspecified whether long term insulin use (HCC) Continue hypoglycemic medications as already ordered, these medications have been reviewed and there are no changes at this time.  Hgb A1C to be  monitored as already arranged by primary service  5. Mixed hyperlipidemia Continue statin as ordered and reviewed, no changes at this time  6. Stage 3 chronic kidney disease, unspecified whether stage 3a or 3b CKD (HCC) The patient has advanced renal disease.  However, at the present time the patient is not yet on  dialysis.  Avoid nephrotoxic medications and dehydration.  Further plans per nephrology    Levora Dredge, MD  09/21/2022 11:01 AM

## 2022-09-22 ENCOUNTER — Ambulatory Visit (INDEPENDENT_AMBULATORY_CARE_PROVIDER_SITE_OTHER): Payer: Medicare Other | Admitting: Vascular Surgery

## 2022-09-22 ENCOUNTER — Ambulatory Visit (INDEPENDENT_AMBULATORY_CARE_PROVIDER_SITE_OTHER): Payer: Medicare Other

## 2022-09-22 ENCOUNTER — Encounter (INDEPENDENT_AMBULATORY_CARE_PROVIDER_SITE_OTHER): Payer: Self-pay | Admitting: Vascular Surgery

## 2022-09-22 VITALS — BP 128/64 | HR 94 | Resp 16 | Wt 125.6 lb

## 2022-09-22 DIAGNOSIS — I10 Essential (primary) hypertension: Secondary | ICD-10-CM

## 2022-09-22 DIAGNOSIS — I6523 Occlusion and stenosis of bilateral carotid arteries: Secondary | ICD-10-CM | POA: Diagnosis not present

## 2022-09-22 DIAGNOSIS — E1151 Type 2 diabetes mellitus with diabetic peripheral angiopathy without gangrene: Secondary | ICD-10-CM

## 2022-09-22 DIAGNOSIS — E782 Mixed hyperlipidemia: Secondary | ICD-10-CM

## 2022-09-22 DIAGNOSIS — I739 Peripheral vascular disease, unspecified: Secondary | ICD-10-CM | POA: Diagnosis not present

## 2022-09-22 DIAGNOSIS — N183 Chronic kidney disease, stage 3 unspecified: Secondary | ICD-10-CM

## 2022-09-24 NOTE — Progress Notes (Unsigned)
BH MD/PA/NP OP Progress Note  09/24/2022 3:16 PM Abigail Walker  MRN:  [id number]  Chief Complaint: No chief complaint on file.  HPI: ***  MOCA  Substance use   Tobacco Alcohol Other substances/  Current denies denies denies  Past Smoked 20 years in the past, last in 1990's Denies (hated alcohol due to her mother's history of alcohol use) denies  Past Treatment           Support: Household: by herself Marital status: divorced after ten years of marriage,  until 1979 Number of children: 1 daughter (deceased from cancer. She used to have issues with drug).  She was unable to see her grandchildren for 10 years since the loss of her daughter, although she reconnected recently Employment: retired, Public librarian in the past Education: High school. 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  She never had hugs, kisses except from her maternal grandmother.  Her mother abused alcohol, and she "raised myself."  Visit Diagnosis: No diagnosis found.  Past Psychiatric History: Please see initial evaluation for full details. I have reviewed the history. No updates at this time.     Past Medical History:  Past Medical History:  Diagnosis Date   Adult hypothyroidism 06/16/2013   Anemia    Aortic atherosclerosis (HCC)    Arthritis    Benign hypertension 06/16/2013   Carotid artery disease (HCC)    a.) carotid US 02/10/2020: 1-39% BICA; b.) carotid US 09/22/2021: 1-39% BICA   [name] syndrome    CKD (chronic kidney disease), stage III (HCC)    Coronary artery calcification seen on CT scan    Depression    Esophageal spasm    Esophageal stricture    GERD (gastroesophageal reflux disease)    HBV (hepatitis B virus) infection    High blood pressure    HLD (hyperlipidemia)    Hypothyroidism    LBBB (left bundle branch block)    Lichen planus    Long term current use of antithrombotics/antiplatelets    a.)  clopidogrel   Mixed incontinence    OSA on CPAP 06/16/2013   Pneumonia 2023   RLS (restless legs syndrome)    Secondary hyperparathyroidism of renal origin (HCC)    Skin cancer, basal cell    Sleep apnea    Stroke (HCC)    Thyroid disease    Type 2 diabetes mellitus treated with insulin (HCC)     Past Surgical History:  Procedure Laterality Date   CATARACT EXTRACTION Bilateral    COLONOSCOPY  2009   COLONOSCOPY WITH PROPOFOL N/A 11/19/2021   Procedure: COLONOSCOPY WITH PROPOFOL;  Surgeon: Regis Bill, MD;  Location: [facility] ENDOSCOPY;  Service: Endoscopy;  Laterality: N/A;  DM   DILATATION & CURETTAGE/HYSTEROSCOPY WITH MYOSURE  10/14/2021   Procedure: MYOSURE POLYPECTOMY;  Surgeon: Christeen Douglas, MD;  Location: [facility] ORS;  Service: Gynecology;;   ESOPHAGOGASTRODUODENOSCOPY     2009, 2011   ESOPHAGOGASTRODUODENOSCOPY (EGD) WITH PROPOFOL N/A 06/08/2015   Procedure: ESOPHAGOGASTRODUODENOSCOPY (EGD) WITH PROPOFOL;  Surgeon: Wallace Cullens, MD;  Location: [facility] ENDOSCOPY;  Service: Gastroenterology;  Laterality: N/A;   ESOPHAGOGASTRODUODENOSCOPY (EGD) WITH PROPOFOL N/A 11/19/2021   Procedure: ESOPHAGOGASTRODUODENOSCOPY (EGD) WITH PROPOFOL;  Surgeon: Regis Bill, MD;  Location: [facility] ENDOSCOPY;  Service: Endoscopy;  Laterality: N/A;   HEMORRHOID SURGERY     HYSTEROSCOPY WITH D & C N/A 10/14/2021   Procedure: DILATATION AND CURETTAGE /HYSTEROSCOPY;  Surgeon:  Christeen Douglas, MD;  Location: [facility] ORS;  Service: Gynecology;  Laterality: N/A;   IMPLANTABLE CONTACT LENS IMPLANTATION     LYSIS OF ADHESION  10/14/2021   Procedure: LYSIS OF VAGINAL ADHESIONS;  Surgeon: Christeen Douglas, MD;  Location: [facility] ORS;  Service: Gynecology;;   TONSILLECTOMY     TUBAL LIGATION      Family Psychiatric History: Please see initial evaluation for full details. I have reviewed the history. No updates at this time.     Family History:  Family History  Problem Relation Age of Onset   Alcohol abuse  Mother    Cancer Mother    Heart disease Father    Kidney failure Maternal Aunt     Social History:  Social History   Socioeconomic History   Marital status: Single    Spouse name: Not on file   Number of children: Not on file   Years of education: Not on file   Highest education level: Some college, no degree  Occupational History   Not on file  Tobacco Use   Smoking status: Former    Current packs/day: 0.00    Types: Cigarettes    Quit date: 07/27/1993    Years since quitting: 29.1   Smokeless tobacco: Never   Tobacco comments:    quit 20 years  Vaping Use   Vaping status: Never Used  Substance and Sexual Activity   Alcohol use: No    Alcohol/week: 0.0 standard drinks of alcohol   Drug use: No   Sexual activity: Not Currently    Birth control/protection: Post-menopausal  Other Topics Concern   Not on file  Social History Narrative   Lives alone   Social Determinants of Health   Financial Resource Strain: Medium Risk (06/07/2022)   Received from [facility], [facility]   Overall Financial Resource Strain (CARDIA)    Difficulty of Paying Living Expenses: Somewhat hard  Food Insecurity: No Food Insecurity (06/07/2022)   Received from [facility], [facility]   Hunger Vital Sign    Worried About Running Out of Food in the Last Year: Never true    Ran Out of Food in the Last Year: Never true  Transportation Needs: No Transportation Needs (06/07/2022)   Received from [facility], [facility]   [facility] - Transportation    In the past 12 months, has lack of transportation kept you from medical appointments or from getting medications?: No    Lack of Transportation (Non-Medical): No  Physical Activity: Inactive (12/01/2021)   Received from [facility], [facility]   Exercise Vital Sign    Days of Exercise per Week: 0 days     Minutes of Exercise per Session: 0 min  Stress: Not on file  Social Connections: Socially Isolated (12/01/2021)   Received from [facility], [facility]   Social Connection and Isolation Panel [NHANES]    Frequency of Communication with Friends and Family: Never    Frequency of Social Gatherings with Friends and Family: Never    Attends Religious Services: Never    Database administrator or Organizations: No    Attends Banker Meetings: Never    Marital Status: Divorced    Allergies:  Allergies  Allergen Reactions   Amoxicillin-Pot Clavulanate Rash   Metformin And Related Nausea And Vomiting   Sulfa Antibiotics Nausea And Vomiting   Hydrocortisone-Iodoquinol  [Hydrocortisone-Iodoquinol]  Rash    Patient reports that she does not believe that she has allergy to this, that she was receiving many medications at once.    Metabolic Disorder Labs: Lab Results  Component Value Date   HGBA1C 6.6 (H) 10/27/2021   MPG 142.72 10/27/2021   MPG 145.59 08/11/2021   No results found for: "PROLACTIN" No results found for: "CHOL", "TRIG", "HDL", "CHOLHDL", "VLDL", "LDLCALC" Lab Results  Component Value Date   TSH 0.061 (L) 10/27/2021   TSH 11.315 (H) 01/09/2020    Therapeutic Level Labs: No results found for: "LITHIUM" No results found for: "VALPROATE" No results found for: "CBMZ"  Current Medications: Current Outpatient Medications  Medication Sig Dispense Refill   acetaminophen (TYLENOL) 500 MG tablet Take 500 mg by mouth every 6 (six) hours as needed for moderate pain.     buPROPion (WELLBUTRIN XL) 150 MG 24 hr tablet Take 3 tablets (450 mg total) by mouth daily. 270 tablet 0   Cholecalciferol (VITAMIN D3) 50 MCG (2000 UT) TABS Take 2 tablets by mouth daily.     clobetasol ointment (TEMOVATE) 0.05 % APPLY ONE APPLICATION TOPICALLY DAILY. VAGINA 60 g 0   clopidogrel (PLAVIX) 75 MG tablet Take 1 tablet (75 mg total) by mouth at  bedtime. 30 tablet 0   docusate sodium (COLACE) 100 MG capsule Take 1 capsule (100 mg total) by mouth daily. 30 capsule 0   empagliflozin (JARDIANCE) 25 MG TABS tablet Take by mouth.     ferrous sulfate 325 (65 FE) MG tablet Take 1 tablet (325 mg total) by mouth daily with breakfast. (Patient taking differently: Take 325 mg by mouth daily.) 30 tablet 0   insulin glargine (LANTUS) 100 UNIT/ML injection Inject 0.15 mLs (15 Units total) into the skin at bedtime. (Patient taking differently: Inject 15 Units into the skin daily. morning) 10 mL 0   Insulin Syringe-Needle U-100 (INSULIN SYRINGE 1CC/31GX5/16") 31G X 5/16" 1 ML MISC USE ONE SYRINGE TWICE DAILY AS DIRECTED WITH INSULIN VIALS     levothyroxine (SYNTHROID) 75 MCG tablet Take 1 tablet (75 mcg total) by mouth daily before breakfast. 30 tablet 0   lidocaine-prilocaine (EMLA) cream Apply 1 Application topically as needed. To your vulva for pain control (Patient not taking: Reported on 09/22/2022) 30 g 0   NOVOLOG 100 UNIT/ML injection Inject 6 Units into the skin in the morning and at bedtime. (Patient taking differently: Inject 4-6 Units into the skin in the morning and at bedtime. 4 units in the morning and 6 units at bedtime) 10 mL 0   nystatin cream (MYCOSTATIN) Apply 1 application topically as needed (yeast).     PARoxetine (PAXIL) 20 MG tablet Take 1 tablet (20 mg total) by mouth daily. 90 tablet 0   rosuvastatin (CRESTOR) 40 MG tablet Take 1 tablet (40 mg total) by mouth at bedtime. 90 tablet 0   terbinafine (LAMISIL) 250 MG tablet Take 250 mg by mouth daily. (Patient not taking: Reported on 09/22/2022)     torsemide (DEMADEX) 10 MG tablet Take 1 tablet (10 mg total) by mouth every other day. 30 tablet 0   No current facility-administered medications for this visit.     Musculoskeletal: Strength & Muscle Tone: within normal limits Gait & Station: normal Patient leans: N/A  Psychiatric Specialty Exam: Review of Systems  There were no  vitals taken for this visit.There is no height or weight on file to calculate BMI.  General Appearance: {Appearance:22683}  Eye Contact:  {BHH EYE CONTACT:22684}  Speech:  Clear and Coherent  Volume:  Normal  Mood:  {BHH MOOD:22306}  Affect:  {Affect (PAA):22687}  Thought Process:  Coherent  Orientation:  Full (Time, Place, and Person)  Thought Content: Logical   Suicidal Thoughts:  {ST/HT (PAA):22692}  Homicidal Thoughts:  {ST/HT (PAA):22692}  Memory:  Immediate;   Good  Judgement:  {Judgement (PAA):22694}  Insight:  {Insight (PAA):22695}  Psychomotor Activity:  Normal  Concentration:  Concentration: Good and Attention Span: Good  Recall:  Good  Fund of Knowledge: Good  Language: Good  Akathisia:  No  Handed:  Right  AIMS (if indicated): not done  Assets:  Communication Skills Desire for Improvement  ADL's:  Intact  Cognition: WNL  Sleep:  {BHH GOOD/FAIR/POOR:22877}   Screenings: [employer] Row Office Visit from 06/06/2022 in [facility]  PHQ-2 Total Score 1  PHQ-9 Total Score 5      Flowsheet Row Admission (Discharged) from 10/14/2021 in [facility] from 08/10/2021 in [facility] Emergency Department at [facility] Pre-Admission Testing 45 from 01/25/2021 in [facility] PRE ADMISSION TESTING  C-SSRS RISK CATEGORY No Risk No Risk No Risk        Assessment and Plan:  ZYAH CHAPA is a 77 y.o. year old female with a history of depression, endometrial adenocarcinoma s/p hysterectomy and bilateral SPO, not on chemotherapy or radiation secondary to lichen planus, right occipital infartcs in 2021, cognitive improvements, type II diabetes, hypertension, hyperlipidemia, LBBB, who is referred for depression.    1. MDD (major depressive disorder), recurrent episode, mild (HCC) Acute stressors include: financial strain in the setting of buying a car  Other stressors  include: absence of nurturing except her maternal grandfather (her mother had alcohol use issues) History: depression since child, originally on Paxil 20 mg daily, bupropion 450 mg daily   Although she reports depressive symptoms in the setting of stressors as above, it has been overall improving since she decided to sell her car.  She reports overall stability in her mood symptoms since being on the current medication regimen.  Will continue Paxil and bupropion to target depression.    2. Neurocognitive deficits  # Hallucinations Functional Status   IADL: Independent in the following: managing finances, medications, driving           Requires assistance with the following:  ADL  Independent in the following: bathing and hygiene, feeding, continence, grooming and toileting, walking          Requires assistance with the following: Folate, Vitamin B12 (not checked), TSH wnl 04/2022 Images Brain: Chronic white matter ischemic change. No evidence of acute infarction, hemorrhage, hydrocephalus, extra-axial collection or mass lesion/mass effect. Neuropsych assessment: SLUMS 27 on 11/2021 Etiology: VaD  Exam is notable for occasional irrelevant thought process (such as suddenly talking about blood sugar when she was asked about Paxil), although this might be appropriate considering her age.  Noted that she reports chronic hallucinations, which could be secondary to neurocognitive disorder.  Will not start antipsychotics at this time given she denies any significant distress from these symptoms.  Will plan to do further evaluation at the next visit.      Plan (she will contact the office if she needs a refill) Continue Paxil 20 mg daily Continue bupropion 450 mg daily   Next appointment: 7/29 at 3 pm for 30 mins, IP Plan to check labs (vitamin B12, folate, vitamin D) at the next visit  The patient demonstrates the following risk factors for suicide: Chronic risk factors for suicide include:  psychiatric disorder of depression . Acute risk factors for suicide include: unemployment and loss (financial, interpersonal, professional). Protective factors for this patient include: coping skills and hope for the future. Considering these factors, the overall suicide risk at this point appears to be low. Patient is appropriate for outpatient follow up.   Collaboration of Care: Collaboration of Care: {BH OP Collaboration of Care:21014065}  Patient/Guardian was advised Release of Information must be obtained prior to any record release in order to collaborate their care with an outside provider. Patient/Guardian was advised if they have not already done so to contact the registration department to sign all necessary forms in order for [place] to release information regarding their care.   Consent: Patient/Guardian gives verbal consent for treatment and assignment of benefits for services provided during this visit. Patient/Guardian expressed understanding and agreed to proceed.    Abigail Hotter, MD 09/24/2022, 3:16 PM

## 2022-09-25 ENCOUNTER — Encounter (INDEPENDENT_AMBULATORY_CARE_PROVIDER_SITE_OTHER): Payer: Self-pay | Admitting: Vascular Surgery

## 2022-09-29 ENCOUNTER — Encounter: Payer: Self-pay | Admitting: Psychiatry

## 2022-09-29 ENCOUNTER — Ambulatory Visit (INDEPENDENT_AMBULATORY_CARE_PROVIDER_SITE_OTHER): Payer: Medicare Other | Admitting: Psychiatry

## 2022-09-29 VITALS — BP 122/74 | HR 86 | Temp 97.4°F | Ht 60.0 in | Wt 126.2 lb

## 2022-09-29 DIAGNOSIS — R29818 Other symptoms and signs involving the nervous system: Secondary | ICD-10-CM

## 2022-09-29 DIAGNOSIS — F330 Major depressive disorder, recurrent, mild: Secondary | ICD-10-CM | POA: Diagnosis not present

## 2022-09-29 DIAGNOSIS — R4189 Other symptoms and signs involving cognitive functions and awareness: Secondary | ICD-10-CM

## 2022-09-29 NOTE — Patient Instructions (Signed)
Continue Paxil 20 mg daily Continue bupropion 450 mg daily   Next appointment: 12/12 at 4 pm

## 2022-10-30 ENCOUNTER — Emergency Department
Admission: EM | Admit: 2022-10-30 | Discharge: 2022-10-30 | Disposition: A | Discharge status: Home / Self Care | Payer: Medicare Other | Attending: Emergency Medicine | Admitting: Emergency Medicine

## 2022-10-30 ENCOUNTER — Other Ambulatory Visit: Payer: Self-pay

## 2022-10-30 DIAGNOSIS — W228XXA Striking against or struck by other objects, initial encounter: Secondary | ICD-10-CM | POA: Insufficient documentation

## 2022-10-30 DIAGNOSIS — S59902A Unspecified injury of left elbow, initial encounter: Secondary | ICD-10-CM | POA: Diagnosis present

## 2022-10-30 DIAGNOSIS — S51012A Laceration without foreign body of left elbow, initial encounter: Secondary | ICD-10-CM | POA: Insufficient documentation

## 2022-10-30 NOTE — ED Triage Notes (Signed)
Pt states she was mowing the lawn and a branch hit her left arm. Pt has skin tear to arm, bleeding controlled.

## 2022-10-30 NOTE — ED Provider Notes (Signed)
[facility] Provider Note  Patient Contact: 11:03 PM (approximate)   History   Extremity Laceration   HPI  Abigail Walker is a 77 y.o. female presents to the emergency department with a left forearm skin tear.  Patient states that she sustained injury on a tree branch earlier tonight.  Tetanus status is up-to-date.      Physical Exam   Triage Vital Signs: ED Triage Vitals  Encounter Vitals Group     BP 10/30/22 1905 (!) 138/58     Systolic BP Percentile --      Diastolic BP Percentile --      Pulse Rate 10/30/22 1905 81     Resp 10/30/22 1905 16     Temp 10/30/22 1905 98 F (36.7 C)     Temp Source 10/30/22 1905 Oral     SpO2 10/30/22 1905 100 %     Weight 10/30/22 1904 114 lb (51.7 kg)     Height 10/30/22 1904 5' (1.524 m)     Head Circumference --      Peak Flow --      Pain Score 10/30/22 1904 0     Pain Loc --      Pain Education --      Exclude from Growth Chart --     Most recent vital signs: Vitals:   10/30/22 1905  BP: (!) 138/58  Pulse: 81  Resp: 16  Temp: 98 F (36.7 C)  SpO2: 100%     General: Alert and in no acute distress. Eyes:  PERRL. EOMI. Head: No acute traumatic findings ENT:      Nose: No congestion/rhinnorhea.      Mouth/Throat: Mucous membranes are moist.  Neck: No stridor. No cervical spine tenderness to palpation. Cardiovascular:  Good peripheral perfusion Respiratory: Normal respiratory effort without tachypnea or retractions. Lungs CTAB. Good air entry to the bases with no decreased or absent breath sounds. Gastrointestinal: Bowel sounds 4 quadrants. Soft and nontender to palpation. No guarding or rigidity. No palpable masses. No distention. No CVA tenderness. Musculoskeletal: Full range of motion to all extremities.  Neurologic:  No gross focal neurologic deficits are appreciated.  Skin: Patient has 4 cm, superficial appearing left forearm skin tear.    ED Results / Procedures / Treatments    Labs (all labs ordered are listed, but only abnormal results are displayed) Labs Reviewed - No data to display     PROCEDURES:  Critical Care performed: No  ..Laceration Repair  Date/Time: 10/30/2022 11:05 PM  Performed by: Orvil Feil, PA-C Authorized by: Orvil Feil, PA-C   Consent:    Consent obtained:  Verbal   Risks discussed:  Infection and pain Universal protocol:    Procedure explained and questions answered to patient or proxy's satisfaction: yes     Patient identity confirmed:  Verbally with patient Anesthesia:    Anesthesia method:  None Laceration details:    Location:  Shoulder/arm   Shoulder/arm location:  L lower arm   Length (cm):  4   Depth (mm):  2 Pre-procedure details:    Preparation:  Patient was prepped and draped in usual sterile fashion Exploration:    Limited defect created (wound extended): no     Contaminated: no   Treatment:    Amount of cleaning:  Standard Skin repair:    Repair method:  Tissue adhesive Approximation:    Approximation:  Close    MEDICATIONS ORDERED IN ED: Medications - No data to  display   IMPRESSION / MDM / ASSESSMENT AND PLAN / ED COURSE  I reviewed the triage vital signs and the nursing notes.                              Assessment and plan Skin tear 77 year old female presents to the emergency department with a 4 cm, superficial skin tear of the left forearm.  Vital signs were reassuring at triage.  On exam, patient was alert, active and nontoxic-appearing.  Skin tear was repaired with Dermabond easily in the emergency department and patient education regarding wound care was given.     FINAL CLINICAL IMPRESSION(S) / ED DIAGNOSES   Final diagnoses:  Skin tear of left elbow without complication, initial encounter     Rx / DC Orders   ED Discharge Orders     None        Note:  This document was prepared using Dragon voice recognition software and may include unintentional  dictation errors.   Pia Mau McKinnon, Cordelia Poche 10/30/22 2307    Loleta Rose, MD 11/01/22 [phone]

## 2022-11-02 ENCOUNTER — Other Ambulatory Visit: Payer: Self-pay | Admitting: Internal Medicine

## 2022-11-02 DIAGNOSIS — R911 Solitary pulmonary nodule: Secondary | ICD-10-CM

## 2022-11-14 ENCOUNTER — Telehealth: Payer: Self-pay

## 2022-11-14 ENCOUNTER — Other Ambulatory Visit: Payer: Self-pay | Admitting: Psychiatry

## 2022-11-14 MED ORDER — PAROXETINE HCL 20 MG PO TABS: 20.0000 mg | ORAL_TABLET | Freq: Every day | ORAL | 0 refills | Status: DC

## 2022-11-14 MED ORDER — BUPROPION HCL ER (XL) 150 MG PO TB24: 450.0000 mg | ORAL_TABLET | Freq: Every day | ORAL | 0 refills | Status: DC

## 2022-11-14 NOTE — Telephone Encounter (Signed)
pt left a message that she needs a 90 day supply paxil and the bupropion sent to [facility] pharmacy. pt was last seen on 9-12 next appt 12-12

## 2022-11-14 NOTE — Telephone Encounter (Signed)
Ordered refills

## 2022-11-14 NOTE — Telephone Encounter (Signed)
tried to call [facility] 3 times and each time got hung up on . so called and left a message confirming the pick up of the paxil,

## 2022-11-15 ENCOUNTER — Ambulatory Visit
Admission: RE | Admit: 2022-11-15 | Discharge: 2022-11-15 | Disposition: A | Discharge status: Home / Self Care | Payer: Medicare Other | Source: Ambulatory Visit | Attending: Internal Medicine | Admitting: Internal Medicine

## 2022-11-15 DIAGNOSIS — R911 Solitary pulmonary nodule: Secondary | ICD-10-CM

## 2022-11-15 NOTE — Telephone Encounter (Signed)
left message notifying that rxs were sent to the pharmacy

## 2022-11-25 ENCOUNTER — Other Ambulatory Visit: Payer: Self-pay | Admitting: Student

## 2022-11-25 DIAGNOSIS — R296 Repeated falls: Secondary | ICD-10-CM

## 2022-11-27 ENCOUNTER — Ambulatory Visit
Admission: RE | Admit: 2022-11-27 | Discharge: 2022-11-27 | Disposition: A | Discharge status: Home / Self Care | Payer: Medicare Other | Source: Ambulatory Visit | Attending: Student | Admitting: Student

## 2022-11-27 DIAGNOSIS — R296 Repeated falls: Secondary | ICD-10-CM | POA: Diagnosis present

## 2022-12-06 ENCOUNTER — Ambulatory Visit: Payer: Medicare Other | Admitting: Dermatology

## 2022-12-06 DIAGNOSIS — L578 Other skin changes due to chronic exposure to nonionizing radiation: Secondary | ICD-10-CM

## 2022-12-06 DIAGNOSIS — L814 Other melanin hyperpigmentation: Secondary | ICD-10-CM

## 2022-12-06 DIAGNOSIS — L820 Inflamed seborrheic keratosis: Secondary | ICD-10-CM | POA: Diagnosis not present

## 2022-12-06 DIAGNOSIS — W908XXA Exposure to other nonionizing radiation, initial encounter: Secondary | ICD-10-CM

## 2022-12-06 NOTE — Patient Instructions (Addendum)

## 2022-12-06 NOTE — Progress Notes (Signed)
   Follow-Up Visit   Subjective  Abigail Walker is a 77 y.o. female who presents for the following: Scaly spot on the left cheek, present for a couple of years. No bleeding. She also has a growth on the left breast that has changed color.   The patient has spots, moles and lesions to be evaluated, some may be new or changing and the patient may have concern these could be cancer.   The following portions of the chart were reviewed this encounter and updated as appropriate: medications, allergies, medical history  Review of Systems:  No other skin or systemic complaints except as noted in HPI or Assessment and Plan.  Objective  Well appearing patient in no apparent distress; mood and affect are within normal limits.  A focused examination was performed of the following areas: Face  Relevant physical exam findings are noted in the Assessment and Plan.  Left Lateral Breast x 1; Left Malar Cheek x 1 (2) Erythematous stuck-on, waxy papule or plaque    Assessment & Plan   Inflamed seborrheic keratosis (2) Left Lateral Breast x 1; Left Malar Cheek x 1  vs AK (L malar cheek).  Symptomatic, irritating, patient would like treated.  Destruction of lesion - Left Lateral Breast x 1; Left Malar Cheek x 1 (2)  Destruction method: cryotherapy   Informed consent: discussed and consent obtained   Lesion destroyed using liquid nitrogen: Yes   Region frozen until ice ball extended beyond lesion: Yes   Outcome: patient tolerated procedure well with no complications   Post-procedure details: wound care instructions given   Additional details:  Prior to procedure, discussed risks of blister formation, small wound, skin dyspigmentation, or rare scar following cryotherapy. Recommend Vaseline ointment to treated areas while healing.   ACTINIC DAMAGE - chronic, secondary to cumulative UV radiation exposure/sun exposure over time - diffuse scaly erythematous macules with underlying  dyspigmentation - Recommend daily broad spectrum sunscreen SPF 30+ to sun-exposed areas, reapply every 2 hours as needed.  - Recommend staying in the shade or wearing long sleeves, sun glasses (UVA+UVB protection) and wide brim hats (4-inch brim around the entire circumference of the hat). - Call for new or changing lesions.  LENTIGINES Exam: scattered tan macules Due to sun exposure Treatment Plan: Benign-appearing, observe. Recommend daily broad spectrum sunscreen SPF 30+ to sun-exposed areas, reapply every 2 hours as needed.  Call for any changes   Return if symptoms worsen or fail to improve.  ICherlyn Labella, CMA, am acting as scribe for Willeen Niece, MD .   Documentation: I have reviewed the above documentation for accuracy and completeness, and I agree with the above.  Willeen Niece, MD

## 2022-12-19 ENCOUNTER — Ambulatory Visit: Payer: Medicare Other | Attending: Student

## 2022-12-19 DIAGNOSIS — M6281 Muscle weakness (generalized): Secondary | ICD-10-CM | POA: Insufficient documentation

## 2022-12-19 DIAGNOSIS — R2681 Unsteadiness on feet: Secondary | ICD-10-CM | POA: Diagnosis present

## 2022-12-19 NOTE — Therapy (Signed)
OUTPATIENT PHYSICAL THERAPY BALANCE EVALUATION   Patient Name: Abigail Walker MRN: [id number] DOB:02/11/1945, 77 y.o., female Today's Date: 12/19/2022  END OF SESSION:  PT End of Session - 12/19/22 1103     Visit Number 1    Number of Visits 25    Date for PT Re-Evaluation 02/13/23    Authorization Type eval: 12/19/22    PT Start Time 1115    PT Stop Time 1200    PT Time Calculation (min) 45 min    Activity Tolerance Patient tolerated treatment well    Behavior During Therapy [facility] for tasks assessed/performed            Past Medical History:  Diagnosis Date   Adult hypothyroidism 06/16/2013   Anemia    Aortic atherosclerosis (HCC)    Arthritis    Benign hypertension 06/16/2013   Carotid artery disease (HCC)    a.) carotid US 02/10/2020: 1-39% BICA; b.) carotid US 09/22/2021: 1-39% BICA   [name] syndrome    CKD (chronic kidney disease), stage III (HCC)    Coronary artery calcification seen on CT scan    Depression    Esophageal spasm    Esophageal stricture    GERD (gastroesophageal reflux disease)    HBV (hepatitis B virus) infection    High blood pressure    HLD (hyperlipidemia)    Hypothyroidism    LBBB (left bundle branch block)    Lichen planus    Long term current use of antithrombotics/antiplatelets    a.) clopidogrel   Mixed incontinence    OSA on CPAP 06/16/2013   Pneumonia 2023   RLS (restless legs syndrome)    Secondary hyperparathyroidism of renal origin (HCC)    Skin cancer, basal cell    Sleep apnea    Stroke (HCC)    Thyroid disease    Type 2 diabetes mellitus treated with insulin (HCC)    Past Surgical History:  Procedure Laterality Date   CATARACT EXTRACTION Bilateral    COLONOSCOPY  2009   COLONOSCOPY WITH PROPOFOL N/A 11/19/2021   Procedure: COLONOSCOPY WITH PROPOFOL;  Surgeon: Regis Bill, MD;  Location: [facility] ENDOSCOPY;  Service: Endoscopy;  Laterality: N/A;  DM   DILATATION & CURETTAGE/HYSTEROSCOPY WITH MYOSURE   10/14/2021   Procedure: MYOSURE POLYPECTOMY;  Surgeon: Christeen Douglas, MD;  Location: [facility] ORS;  Service: Gynecology;;   ESOPHAGOGASTRODUODENOSCOPY     2009, 2011   ESOPHAGOGASTRODUODENOSCOPY (EGD) WITH PROPOFOL N/A 06/08/2015   Procedure: ESOPHAGOGASTRODUODENOSCOPY (EGD) WITH PROPOFOL;  Surgeon: Wallace Cullens, MD;  Location: [facility] ENDOSCOPY;  Service: Gastroenterology;  Laterality: N/A;   ESOPHAGOGASTRODUODENOSCOPY (EGD) WITH PROPOFOL N/A 11/19/2021   Procedure: ESOPHAGOGASTRODUODENOSCOPY (EGD) WITH PROPOFOL;  Surgeon: Regis Bill, MD;  Location: [facility] ENDOSCOPY;  Service: Endoscopy;  Laterality: N/A;   HEMORRHOID SURGERY     HYSTEROSCOPY WITH D & C N/A 10/14/2021   Procedure: DILATATION AND CURETTAGE /HYSTEROSCOPY;  Surgeon: Christeen Douglas, MD;  Location: [facility] ORS;  Service: Gynecology;  Laterality: N/A;   IMPLANTABLE CONTACT LENS IMPLANTATION     LYSIS OF ADHESION  10/14/2021   Procedure: LYSIS OF VAGINAL ADHESIONS;  Surgeon: Christeen Douglas, MD;  Location: [facility] ORS;  Service: Gynecology;;   TONSILLECTOMY     TUBAL LIGATION     Patient Active Problem List   Diagnosis Date Noted   [employer]ne[employer] (FIGO) malignant neoplasm of endometrium stage IB (HCC) 01/19/2022   Status post surgery 10/14/2021   Aspiration pneumonia (HCC) 08/11/2021   CAP (  community acquired pneumonia) 08/10/2021   Chest pain 08/10/2021   Dysphagia 08/10/2021   Type 2 diabetes mellitus with peripheral neuropathy (HCC) 08/10/2021   PAD (peripheral artery disease) (HCC) 02/22/2020   Hypoglycemia due to type 2 diabetes mellitus (HCC)    Hypothermia    Dehydration    Hypoglycemia 01/09/2020   Moderate protein-calorie malnutrition (HCC) 01/08/2020   Fall    Hyponatremia    Hyperglycemia 12/05/2019   Acute lower UTI 12/05/2019   Depression    Lumbar compression fracture (HCC)    Weakness of left foot 09/14/2019   Hyperlipidemia 08/13/2019   Hypercalcemia 05/01/2019    Leukopenia 01/17/2019   Stage 3 chronic kidney disease (HCC) 01/17/2019   Benign hypertensive kidney disease with chronic kidney disease 01/16/2019   Hyposmolality and/or hyponatremia 01/16/2019   Secondary hyperparathyroidism of renal origin (HCC) 01/16/2019   Carotid artery disease (HCC) 08/10/2017   Chronic midline low back pain without sciatica 03/16/2017   Primary osteoarthritis of both hands 01/16/2017   Benign essential tremor 08/12/2015   MGUS (monoclonal gammopathy of unknown significance) 07/08/2015   Mixed incontinence 11/09/2014   Atrophic vaginitis 07/28/2014   Encounter for general adult medical examination without abnormal findings 05/28/2014   Major depression in remission (HCC) 11/27/2013   Morbid obesity (HCC) 11/16/2013   Microalbuminuria 08/06/2013   Breath shortness 08/02/2013   Acid reflux 06/16/2013   Benign hypertension 06/16/2013   Type 2 diabetes mellitus (HCC) 06/16/2013   Hypothyroidism 06/16/2013   Obstructive apnea 06/16/2013   Hyperlipidemia associated with type 2 diabetes mellitus (HCC) 57/84/6962   Oral lichen planus 05/10/2011   Lichen planus 11/09/2010    PCP: Lauro Regulus, MD  REFERRING PROVIDER: Janice Coffin, PA-C   REFERRING DIAG: R29.6 (ICD-10-CM) - Repeated falls   RATIONALE FOR EVALUATION AND TREATMENT: Rehabilitation  THERAPY DIAG: Unsteadiness on feet - Plan: PT plan of care cert/re-cert  Muscle weakness (generalized) - Plan: PT plan of care cert/re-cert  ONSET DATE: Approximately 2 years  FOLLOW-UP APPT SCHEDULED WITH REFERRING PROVIDER: Yes    SUBJECTIVE:                                                                                                                                                                                         SUBJECTIVE STATEMENT:  Unsteadiness and falls  PERTINENT HISTORY:  Pt referred by neurology for issues with imbalance and falls. She states that she has been having difficulty with  her balance for approximately 2 years. She reports dragging her feet occasionally when she walks. She has had 3 falls in the last 6 months but denies any significant injuries except for skin  lacerations. She has a history of small infarcts involving right occipital lobe with left homonymous hemianopsia. Pt actually reports she is lacking midline vision bilaterally but per chart has poor insight into her deficits. No new neurologic symptoms recently. She has a medical history significant for diabetes mellitus with complications of stage III chronic kidney disease, depression, hypothyroidism, hypertension, cognitive changes, recurrent falls and GERD. Also has a history of endometrial adenocarcinoma, s/p hysterectomy and bilateral SPO, not on chemotherapy or radiation secondary to lichen planus.  MRI brain with and without contrast 01/02/2020: No acute intracranial abnormality. Moderate parenchymal volume loss and chronic microvascular ischemic white matter disease. No acute fracture or traumatic listhesis of the cervical spine. Multilevel degenerative changes of the cervical spine as described above. Cervical and intracranial atherosclerosis.   MRI brain 11/27/22 without results yet  CT Cervical Spine without contrast 12/05/2019: No acute fracture or traumatic listhesis of the cervical spine. Multilevel degenerative changes of the cervical spine as described above. Cervical and intracranial atherosclerosis. Emphysema (ICD10-J43.9).   CT thoracic Spine without contrast 12/05/2019: L1 compression deformity, better detailed dedicated lumbar imaging. Slightly sclerotic endplate change and central cupping in the inferior endplate T12 with a rounded lucency and extension of nitrogenous gas from the disc space is favored to reflect an acute to subacute Schmorl's node formation rather than true compression deformity. No other acute thoracic spine fracture or vertebral body height loss. Multilevel degenerative changes  of the thoracic spine without significant spinal canal stenosis or neural foraminal narrowing within the thoracic levels. 3 mm solid nodule in the anterosuperior segment right upper lobe.   Pain: Yes, "arthritis" pain in spine Numbness/Tingling: Yes, bilateral LE neuropathy, some bilateral hand tingling  Focal Weakness: Yes, L sided weakness per subjective report Recent changes in overall health/medication: Yes, worsening kidney function. Recently started Jardiance.  Prior history of physical therapy for balance:  No Dominant hand: left, uses both hands Imaging: Yes, see above  PRECAUTIONS: Fall  WEIGHT BEARING RESTRICTIONS: No  FALLS: Has patient fallen in last 6 months? Yes. Number of falls 3 , Directional pattern for falls: Yes to the left  Living Environment Lives with: lives alone Lives in: House/apartment, double wide mobile home Stairs: single leg home, ramp on front to enter, 8 steps on back with bilateral rails that she can reach simulataneously Has following equipment at home: Single point cane, Walker - 2 wheeled, Environmental consultant - 4 wheeled, and Wheelchair (manual) Tub shower (no grab bars), shower seat  Prior level of function: Independent  Occupational demands: Retired  Presenter, broadcasting: Social research officer, government, watching sports  Patient Goals: Improve balance. "I need to learn how to slow down."   OBJECTIVE:   Patient Surveys  FOTO: 36, predicted improvement to 60 ABC: 77.5%  Cognition Patient is oriented to person, place, and time.  Recent memory is intact.  Remote memory is intact.  Attention span and concentration are intact.  Expressive speech is intact.  Patient's fund of knowledge is within normal limits for educational level.    Gross Musculoskeletal Assessment Tremor: None Bulk: Normal Tone: Normal  Posture: Forward head with upper thoracic posture  AROM Deferred  LE MMT: MMT (out of 5) Right  Left   Hip flexion 4 4  Hip extension    Hip abduction (seated) 4 4   Hip adduction (setaed) 4 4  Hip internal rotation    Hip external rotation    Knee flexion (seated) 5 5  Knee extension 5 5  Ankle dorsiflexion 5 5  Ankle  plantarflexion    (* = pain; Blank rows = not tested)  UE strength testing WNL and symmetrical  Sensation Deferred  Reflexes Deferred  Cranial Nerves Deferred  Coordination/Cerebellar Deferred  Bed mobility: Deferred  Transfers: Assistive device utilized: None  Sit to stand: Complete Independence Stand to sit: Complete Independence Chair to chair: Complete Independence Floor:  Not performed  Curb:  Deferred  Stairs: Level of Assistance: Modified independence Stair Negotiation Technique: Step to Pattern with Single Rail on Right Number of Stairs: 4  Height of Stairs: 6"  Comments: Reciprocal ascending and step-to descending  Gait: Gait pattern: decreased step length- Right and decreased step length- Left Distance walked: 100' Assistive device utilized: None Level of assistance: Complete Independence Comments: Decreased self-selected speed  Clinical Test of Sensory Interaction for Balance (CTSIB): CONDITION TIME  Eyes open, firm surface 30 seconds  Eyes closed, firm surface 7 seconds  Eyes open, foam surface 30 seconds  Eyes closed, foam surface 1.8 seconds   Functional Outcome Measures  Results Comments  BERG 51/56 Mild balance deficits  DGI    FGA    TUG 8.3 seconds WNL  5TSTS 11.0 seconds WNL  6 Minute Walk Test    10 Meter Gait Speed Self-selected: 16.8s = 0.60 m/s; Fastest: 8.5s = 1.18 m/s Decreased self selected gait speed  (Blank rows = not tested)   TODAY'S TREATMENT  Deferred  PATIENT EDUCATION:  Education details: Plan of care and examination findings Person educated: Patient Education method: Explanation Education comprehension: verbalized understanding   HOME EXERCISE PROGRAM:  None currently   ASSESSMENT:  CLINICAL IMPRESSION: Patient is a 77 y.o. female who was seen  today for physical therapy evaluation and treatment for repeated falls and imbalance.   OBJECTIVE IMPAIRMENTS: decreased balance, decreased cognition, and decreased strength.   ACTIVITY LIMITATIONS: squatting, stairs, and transfers  PARTICIPATION LIMITATIONS: cleaning, laundry, shopping, and community activity  PERSONAL FACTORS: Age, Past/current experiences, Time since onset of injury/illness/exacerbation, and 3+ comorbidities: CVA, visual deficits, cognitive deficits, hearing impairment, DM, and CKD  are also affecting patient's functional outcome.   REHAB POTENTIAL: Fair    CLINICAL DECISION MAKING: Unstable/unpredictable  EVALUATION COMPLEXITY: High   GOALS: Goals reviewed with patient? No  SHORT TERM GOALS: Target date: 01/30/2023  Pt will be independent with HEP in order to improve strength and balance in order to decrease fall risk and improve function at home. Baseline:  Goal status: INITIAL   LONG TERM GOALS: Target date: 03/13/2023  Pt will increase FOTO to at least 60 to demonstrate significant improvement in function at home related to balance  Baseline: 49 Goal status: INITIAL  2.  Pt will improve BERG by at least 3 points in order to demonstrate clinically significant improvement in balance.   Baseline: 51/56 Goal status: INITIAL  3.  Pt will improve mini-BESTest by at least 4 points in order to demonstrate clinically significant improvement in balance and decreased risk for falls       Baseline: To be completed Goal status: INITIAL  4. Pt will increase self-selected 65m gait speed to at least 0.80 m/s in order to demonstrate clinically significant improvement in community ambulation.   Baseline: Self-selected: 16.8s = 0.60 m/s Goal status: INITIAL   PLAN: PT FREQUENCY: 2x/week  PT DURATION: 12 weeks  PLANNED INTERVENTIONS: Therapeutic exercises, Therapeutic activity, Neuromuscular re-education, Balance training, Gait training, Patient/Family education,  Self Care, Joint mobilization, Joint manipulation, Vestibular training, Canalith repositioning, Orthotic/Fit training, DME instructions, Dry Needling, Electrical stimulation, Spinal manipulation,  Spinal mobilization, Cryotherapy, Moist heat, Taping, Traction, Ultrasound, Ionotophoresis 4mg /ml Dexamethasone, Manual therapy, and Re-evaluation.  PLAN FOR NEXT SESSION: Perform mini-BESTest, consider FGA, initiate balance and strength exercises, issue HEP;   Sharalyn Ink Tamari Redwine PT, DPT, GCS  Velencia Lenart 12/19/2022, 12:48 PM

## 2022-12-21 ENCOUNTER — Ambulatory Visit: Payer: Medicare Other | Admitting: Physical Therapy

## 2022-12-21 ENCOUNTER — Encounter: Payer: Self-pay | Admitting: Physical Therapy

## 2022-12-21 DIAGNOSIS — R2681 Unsteadiness on feet: Secondary | ICD-10-CM | POA: Diagnosis not present

## 2022-12-21 DIAGNOSIS — M6281 Muscle weakness (generalized): Secondary | ICD-10-CM

## 2022-12-21 NOTE — Therapy (Unsigned)
OUTPATIENT PHYSICAL THERAPY TREATMENT   Patient Name: Abigail Walker MRN: [id number] DOB:25-Feb-1945, 77 y.o., female Today's Date: 12/21/2022  END OF SESSION:  PT End of Session - 12/21/22 1119     Visit Number 2    Number of Visits 25    Date for PT Re-Evaluation 02/13/23    Authorization Type eval: 12/19/22    PT Start Time 1119    PT Stop Time 1202    PT Time Calculation (min) 43 min    Activity Tolerance Patient tolerated treatment well    Behavior During Therapy [facility] for tasks assessed/performed             Past Medical History:  Diagnosis Date   Adult hypothyroidism 06/16/2013   Anemia    Aortic atherosclerosis (HCC)    Arthritis    Benign hypertension 06/16/2013   Carotid artery disease (HCC)    a.) carotid US 02/10/2020: 1-39% BICA; b.) carotid US 09/22/2021: 1-39% BICA   [name] syndrome    CKD (chronic kidney disease), stage III (HCC)    Coronary artery calcification seen on CT scan    Depression    Esophageal spasm    Esophageal stricture    GERD (gastroesophageal reflux disease)    HBV (hepatitis B virus) infection    High blood pressure    HLD (hyperlipidemia)    Hypothyroidism    LBBB (left bundle branch block)    Lichen planus    Long term current use of antithrombotics/antiplatelets    a.) clopidogrel   Mixed incontinence    OSA on CPAP 06/16/2013   Pneumonia 2023   RLS (restless legs syndrome)    Secondary hyperparathyroidism of renal origin (HCC)    Skin cancer, basal cell    Sleep apnea    Stroke (HCC)    Thyroid disease    Type 2 diabetes mellitus treated with insulin (HCC)    Past Surgical History:  Procedure Laterality Date   CATARACT EXTRACTION Bilateral    COLONOSCOPY  2009   COLONOSCOPY WITH PROPOFOL N/A 11/19/2021   Procedure: COLONOSCOPY WITH PROPOFOL;  Surgeon: Regis Bill, MD;  Location: [facility] ENDOSCOPY;  Service: Endoscopy;  Laterality: N/A;  DM   DILATATION & CURETTAGE/HYSTEROSCOPY WITH MYOSURE  10/14/2021    Procedure: MYOSURE POLYPECTOMY;  Surgeon: Christeen Douglas, MD;  Location: [facility] ORS;  Service: Gynecology;;   ESOPHAGOGASTRODUODENOSCOPY     2009, 2011   ESOPHAGOGASTRODUODENOSCOPY (EGD) WITH PROPOFOL N/A 06/08/2015   Procedure: ESOPHAGOGASTRODUODENOSCOPY (EGD) WITH PROPOFOL;  Surgeon: Wallace Cullens, MD;  Location: [facility] ENDOSCOPY;  Service: Gastroenterology;  Laterality: N/A;   ESOPHAGOGASTRODUODENOSCOPY (EGD) WITH PROPOFOL N/A 11/19/2021   Procedure: ESOPHAGOGASTRODUODENOSCOPY (EGD) WITH PROPOFOL;  Surgeon: Regis Bill, MD;  Location: [facility] ENDOSCOPY;  Service: Endoscopy;  Laterality: N/A;   HEMORRHOID SURGERY     HYSTEROSCOPY WITH D & C N/A 10/14/2021   Procedure: DILATATION AND CURETTAGE /HYSTEROSCOPY;  Surgeon: Christeen Douglas, MD;  Location: [facility] ORS;  Service: Gynecology;  Laterality: N/A;   IMPLANTABLE CONTACT LENS IMPLANTATION     LYSIS OF ADHESION  10/14/2021   Procedure: LYSIS OF VAGINAL ADHESIONS;  Surgeon: Christeen Douglas, MD;  Location: [facility] ORS;  Service: Gynecology;;   TONSILLECTOMY     TUBAL LIGATION     Patient Active Problem List   Diagnosis Date Noted   [employer] Gyne[employer] and Ob[employer] (FIGO) malignant neoplasm of endometrium stage IB (HCC) 01/19/2022   Status post surgery 10/14/2021   Aspiration pneumonia (HCC) 08/11/2021   CAP (  community acquired pneumonia) 08/10/2021   Chest pain 08/10/2021   Dysphagia 08/10/2021   Type 2 diabetes mellitus with peripheral neuropathy (HCC) 08/10/2021   PAD (peripheral artery disease) (HCC) 02/22/2020   Hypoglycemia due to type 2 diabetes mellitus (HCC)    Hypothermia    Dehydration    Hypoglycemia 01/09/2020   Moderate protein-calorie malnutrition (HCC) 01/08/2020   Fall    Hyponatremia    Hyperglycemia 12/05/2019   Acute lower UTI 12/05/2019   Depression    Lumbar compression fracture (HCC)    Weakness of left foot 09/14/2019   Hyperlipidemia 08/13/2019   Hypercalcemia 05/01/2019   Leukopenia  01/17/2019   Stage 3 chronic kidney disease (HCC) 01/17/2019   Benign hypertensive kidney disease with chronic kidney disease 01/16/2019   Hyposmolality and/or hyponatremia 01/16/2019   Secondary hyperparathyroidism of renal origin (HCC) 01/16/2019   Carotid artery disease (HCC) 08/10/2017   Chronic midline low back pain without sciatica 03/16/2017   Primary osteoarthritis of both hands 01/16/2017   Benign essential tremor 08/12/2015   MGUS (monoclonal gammopathy of unknown significance) 07/08/2015   Mixed incontinence 11/09/2014   Atrophic vaginitis 07/28/2014   Encounter for general adult medical examination without abnormal findings 05/28/2014   Major depression in remission (HCC) 11/27/2013   Morbid obesity (HCC) 11/16/2013   Microalbuminuria 08/06/2013   Breath shortness 08/02/2013   Acid reflux 06/16/2013   Benign hypertension 06/16/2013   Type 2 diabetes mellitus (HCC) 06/16/2013   Hypothyroidism 06/16/2013   Obstructive apnea 06/16/2013   Hyperlipidemia associated with type 2 diabetes mellitus (HCC) 19/14/7829   Oral lichen planus 05/10/2011   Lichen planus 11/09/2010    PCP: Lauro Regulus, MD  REFERRING PROVIDER: Janice Coffin, PA-C   REFERRING DIAG: R29.6 (ICD-10-CM) - Repeated falls   RATIONALE FOR EVALUATION AND TREATMENT: Rehabilitation  THERAPY DIAG: Unsteadiness on feet  Muscle weakness (generalized)  ONSET DATE: Approximately 2 years  FOLLOW-UP APPT SCHEDULED WITH REFERRING PROVIDER: Yes   PERTINENT HISTORY:  Pt referred by neurology for issues with imbalance and falls. She states that she has been having difficulty with her balance for approximately 2 years. She reports dragging her feet occasionally when she walks. She has had 3 falls in the last 6 months but denies any significant injuries except for skin lacerations. She has a history of small infarcts involving right occipital lobe with left homonymous hemianopsia. Pt actually reports she is  lacking midline vision bilaterally but per chart has poor insight into her deficits. No new neurologic symptoms recently. She has a medical history significant for diabetes mellitus with complications of stage III chronic kidney disease, depression, hypothyroidism, hypertension, cognitive changes, recurrent falls and GERD. Also has a history of endometrial adenocarcinoma, s/p hysterectomy and bilateral SPO, not on chemotherapy or radiation secondary to lichen planus.  MRI brain with and without contrast 01/02/2020: No acute intracranial abnormality. Moderate parenchymal volume loss and chronic microvascular ischemic white matter disease. No acute fracture or traumatic listhesis of the cervical spine. Multilevel degenerative changes of the cervical spine as described above. Cervical and intracranial atherosclerosis.   MRI brain 11/27/22 without results yet  CT Cervical Spine without contrast 12/05/2019: No acute fracture or traumatic listhesis of the cervical spine. Multilevel degenerative changes of the cervical spine as described above. Cervical and intracranial atherosclerosis. Emphysema (ICD10-J43.9).   CT thoracic Spine without contrast 12/05/2019: L1 compression deformity, better detailed dedicated lumbar imaging. Slightly sclerotic endplate change and central cupping in the inferior endplate T12 with a rounded lucency and  extension of nitrogenous gas from the disc space is favored to reflect an acute to subacute Schmorl's node formation rather than true compression deformity. No other acute thoracic spine fracture or vertebral body height loss. Multilevel degenerative changes of the thoracic spine without significant spinal canal stenosis or neural foraminal narrowing within the thoracic levels. 3 mm solid nodule in the anterosuperior segment right upper lobe.   Pain: Yes, "arthritis" pain in spine Numbness/Tingling: Yes, bilateral LE neuropathy, some bilateral hand tingling  Focal Weakness: Yes, L  sided weakness per subjective report Recent changes in overall health/medication: Yes, worsening kidney function. Recently started Jardiance.  Prior history of physical therapy for balance:  No Dominant hand: left, uses both hands Imaging: Yes, see above  PRECAUTIONS: Fall  WEIGHT BEARING RESTRICTIONS: No  FALLS: Has patient fallen in last 6 months? Yes. Number of falls 3 , Directional pattern for falls: Yes to the left  Living Environment Lives with: lives alone Lives in: House/apartment, double wide mobile home Stairs: single leg home, ramp on front to enter, 8 steps on back with bilateral rails that she can reach simulataneously Has following equipment at home: Single point cane, Walker - 2 wheeled, Environmental consultant - 4 wheeled, and Wheelchair (manual) Tub shower (no grab bars), shower seat  Prior level of function: Independent  Occupational demands: Retired  Presenter, broadcasting: Social research officer, government, watching sports  Patient Goals: Improve balance. "I need to learn how to slow down."   OBJECTIVE:   Patient Surveys  FOTO: 49, predicted improvement to 60 ABC: 77.5%   Posture: Forward head with upper thoracic posture   LE MMT: MMT (out of 5) Right  Left   Hip flexion 4 4  Hip extension    Hip abduction (seated) 4 4  Hip adduction (setaed) 4 4  Hip internal rotation    Hip external rotation    Knee flexion (seated) 5 5  Knee extension 5 5  Ankle dorsiflexion 5 5  Ankle plantarflexion    (* = pain; Blank rows = not tested)  UE strength testing WNL and symmetrical  Transfers: Assistive device utilized: None  Sit to stand: Complete Independence Stand to sit: Complete Independence Chair to chair: Complete Independence Floor:  Not performed  Curb:  Deferred  Stairs: Level of Assistance: Modified independence Stair Negotiation Technique: Step to Pattern with Single Rail on Right Number of Stairs: 4  Height of Stairs: 6"  Comments: Reciprocal ascending and step-to  descending  Gait: Gait pattern: decreased step length- Right and decreased step length- Left Distance walked: 100' Assistive device utilized: None Level of assistance: Complete Independence Comments: Decreased self-selected speed  Clinical Test of Sensory Interaction for Balance (CTSIB): CONDITION TIME  Eyes open, firm surface 30 seconds  Eyes closed, firm surface 7 seconds  Eyes open, foam surface 30 seconds  Eyes closed, foam surface 1.8 seconds   Functional Outcome Measures  Results Comments  BERG 51/56 Mild balance deficits  DGI    FGA    TUG 8.3 seconds WNL  5TSTS 11.0 seconds WNL  6 Minute Walk Test    10 Meter Gait Speed Self-selected: 16.8s = 0.60 m/s; Fastest: 8.5s = 1.18 m/s Decreased self selected gait speed  MiniBESTest 17/28    (Blank rows = not tested)     TODAY'S TREATMENT     SUBJECTIVE STATEMENT:   Patient reports primarily being challenged with clearing small obstacles in her home and dragging her L foot. She reports usually staggering to L side. Patient reports some discomfort in  coccygeal region from abruptly sitting down last visit - this got better following her return home.     Neuromuscular Re-education - for improved sensory integration, static and dynamic postural control, equilibrium and non-equilibrium coordination as needed for negotiating home and community environment and stepping over obstacles  *Performance of MiniBESTest  Forward step-over, line on floor; 1x10  -for HEP review    Therapeutic Exercise - improved strength as needed to improve performance of CKC activities/functional movements and as needed for power production to prevent fall during episode of large postural perturbation  NuStep; Level 2; decreased to 1 for last 2 minutes, x 5 minutes - for improved soft tissue mobility and increased tissue temperature to improve muscle performance   -subjective gathered during this time  3-way kick; 1x8 with bilat LE Minisquat, in  // bars; 1x10  -for HEP review   PATIENT EDUCATION: Discussed with patient use of adequate lighting in home to prevent tripping over obstacles and due to pt being visually reliant for balance. We discussed benefit of minimally restrictive walking aid i.e. single-point cane for reduced risk with community-level mobility. HEP handout provided to pt for baseline exercises.    PATIENT EDUCATION:  Education details: see above for patient education details Person educated: Patient Education method: Explanation Education comprehension: verbalized understanding   HOME EXERCISE PROGRAM:  Access Code: [email or web address] URL: [email or web address] Date: 12/21/2022 Prepared by: Consuela Mimes  Exercises - Forward Step Over with Counter Support  - 2 x daily - 7 x weekly - 2 sets - 10 reps - Standing 3-Way Kick  - 2 x daily - 7 x weekly - 2 sets - 10 reps - Mini Squat with Counter Support  - 2 x daily - 7 x weekly - 2 sets - 10 reps   ASSESSMENT:  CLINICAL IMPRESSION: Patient is just shy of fall risk cut-off score for MiniBESTest. She is most challenged with postural control testing involving eyes closed, reactive balance, dual-task TUG, and single-limb standing. We will continue with balance drills including eyes-closed static balance drills. We will also integrate dual task training into gait exercise to improve patient's ability to remain safe when completing carrying or other secondary task while on the move in home. We will consider use of FGA next visit and continue with LE strengthening, postural control training, reactive and dynamic balance exercise. HEP was initiated today with specific exercise for foot clearance, hip strengthening, and closed-chain strengthening to improve transferring ability. Pt will continue to benefit from skilled PT services to address deficits and improve function.   OBJECTIVE IMPAIRMENTS: decreased balance, decreased cognition, and decreased strength.    ACTIVITY LIMITATIONS: squatting, stairs, and transfers  PARTICIPATION LIMITATIONS: cleaning, laundry, shopping, and community activity  PERSONAL FACTORS: Age, Past/current experiences, Time since onset of injury/illness/exacerbation, and 3+ comorbidities: CVA, visual deficits, cognitive deficits, hearing impairment, DM, and CKD  are also affecting patient's functional outcome.   REHAB POTENTIAL: Fair    CLINICAL DECISION MAKING: Unstable/unpredictable  EVALUATION COMPLEXITY: High   GOALS: Goals reviewed with patient? No  SHORT TERM GOALS: Target date: 01/30/2023  Pt will be independent with HEP in order to improve strength and balance in order to decrease fall risk and improve function at home. Baseline:  Goal status: INITIAL   LONG TERM GOALS: Target date: 03/13/2023  Pt will increase FOTO to at least 60 to demonstrate significant improvement in function at home related to balance  Baseline: 12/19/22: 49/60 Goal status: INITIAL  2.  Pt will improve BERG by at  least 3 points in order to demonstrate clinically significant improvement in balance.   Baseline: 12/19/22: 51/56 Goal status: INITIAL  3.  Pt will improve mini-BESTest by at least 4 points in order to demonstrate clinically significant improvement in balance and decreased risk for falls       Baseline: 12/19/22: To be completed next visit.    12/21/22: 17/28 Goal status: INITIAL  4. Pt will increase self-selected 64m gait speed to at least 0.80 m/s in order to demonstrate clinically significant improvement in community ambulation.   Baseline: 12/19/22: Self-selected: 16.8s = 0.60 m/s Goal status: INITIAL   PLAN: PT FREQUENCY: 2x/week  PT DURATION: 12 weeks  PLANNED INTERVENTIONS: Therapeutic exercises, Therapeutic activity, Neuromuscular re-education, Balance training, Gait training, Patient/Family education, Self Care, Joint mobilization, Joint manipulation, Vestibular training, Canalith repositioning,  Orthotic/Fit training, DME instructions, Dry Needling, Electrical stimulation, Spinal manipulation, Spinal mobilization, Cryotherapy, Moist heat, Taping, Traction, Ultrasound, Ionotophoresis 4mg /ml Dexamethasone, Manual therapy, and Re-evaluation.  PLAN FOR NEXT SESSION: Complete FGA. Continue with postural control training, integrating eyes-closed tasks. Continue with LE strengthening and obstacle negotiation work; strategies for foot clearance and decreased shuffling.     Consuela Mimes, PT, DPT #[id number]  Gertie Exon 12/21/2022, 11:19 AM

## 2022-12-22 NOTE — Progress Notes (Signed)
BH MD/PA/NP OP Progress Note  12/29/2022 4:50 PM Abigail Walker  MRN:  [id number]  Chief Complaint:  Chief Complaint  Patient presents with   Follow-up   HPI:  This is a follow-up appointment for depression.  She states that she has started to see a physical therapist.  She sees him twice a week, and has been working on exercise on her own.  Although she fell twice, she thinks she has been walking better.  She has occasional dizziness.  She agrees to reach out to her primary care regarding antihypertensive medication.  She had a good Thanksgiving with her friend in [place].  She communicates with her grandson.  She does not know where her grown daughter is.  Although she feels that life she lost interest in doing things, she is planning to get quiche for holiday.  Although she feels down at times, she has been able to get out of.  She sleeps well.  She has occasional anhedonia.  She denies SI.  She feels comfortable to stay on the current medication.   Wt Readings from Last 3 Encounters:  12/29/22 126 lb 12.8 oz (57.5 kg)  10/30/22 114 lb (51.7 kg)  09/29/22 126 lb 3.2 oz (57.2 kg)     Substance use   Tobacco Alcohol Other substances/  Current denies denies denies  Past Smoked 20 years in the past, last in 1990's Denies (hated alcohol due to her mother's history of alcohol use) denies  Past Treatment            Support: Household: by herself Marital status: divorced after ten years of marriage,  until 1979 Number of children: 1 daughter (deceased from cancer. She used to have issues with drug).  She was unable to see her grandchildren for 10 years since the loss of her daughter, although she reconnected recently Employment: retired, Public librarian in the past Education: High school. Received six months of nurse's training at age 53 She states that her parents did not pay attention to her.  She never had hugs, kisses except from her maternal grandmother.  Her mother abused  alcohol, and she "raised myself."  Visit Diagnosis:    ICD-10-CM   1. MDD (major depressive disorder), recurrent episode, mild (HCC)  F33.0     2. Neurocognitive deficits  R29.818    R41.89       Past Psychiatric History: Please see initial evaluation for full details. I have reviewed the history. No updates at this time.    Past Medical History:  Past Medical History:  Diagnosis Date   Adult hypothyroidism 06/16/2013   Anemia    Aortic atherosclerosis (HCC)    Arthritis    Benign hypertension 06/16/2013   Carotid artery disease (HCC)    a.) carotid US 02/10/2020: 1-39% BICA; b.) carotid US 09/22/2021: 1-39% BICA   [name] syndrome    CKD (chronic kidney disease), stage III (HCC)    Coronary artery calcification seen on CT scan    Depression    Esophageal spasm    Esophageal stricture    GERD (gastroesophageal reflux disease)    HBV (hepatitis B virus) infection    High blood pressure    HLD (hyperlipidemia)    Hypothyroidism    LBBB (left bundle branch block)    Lichen planus    Long term current use of antithrombotics/antiplatelets    a.) clopidogrel   Mixed incontinence    OSA on CPAP 06/16/2013   Pneumonia 2023  RLS (restless legs syndrome)    Secondary hyperparathyroidism of renal origin (HCC)    Skin cancer, basal cell    Sleep apnea    Stroke [facility])    Thyroid disease    Type 2 diabetes mellitus treated with insulin [facility])     Past Surgical History:  Procedure Laterality Date   CATARACT EXTRACTION Bilateral    COLONOSCOPY  2009   COLONOSCOPY WITH PROPOFOL N/A 11/19/2021   Procedure: COLONOSCOPY WITH PROPOFOL;  Surgeon: Regis Bill, MD;  Location: [facility] ENDOSCOPY;  Service: Endoscopy;  Laterality: N/A;  DM   DILATATION & CURETTAGE/HYSTEROSCOPY WITH MYOSURE  10/14/2021   Procedure: MYOSURE POLYPECTOMY;  Surgeon: Christeen Douglas, MD;  Location: [facility] ORS;  Service: Gynecology;;   ESOPHAGOGASTRODUODENOSCOPY     2009, 2011    ESOPHAGOGASTRODUODENOSCOPY (EGD) WITH PROPOFOL N/A 06/08/2015   Procedure: ESOPHAGOGASTRODUODENOSCOPY (EGD) WITH PROPOFOL;  Surgeon: Wallace Cullens, MD;  Location: [facility] ENDOSCOPY;  Service: Gastroenterology;  Laterality: N/A;   ESOPHAGOGASTRODUODENOSCOPY (EGD) WITH PROPOFOL N/A 11/19/2021   Procedure: ESOPHAGOGASTRODUODENOSCOPY (EGD) WITH PROPOFOL;  Surgeon: Regis Bill, MD;  Location: [facility] ENDOSCOPY;  Service: Endoscopy;  Laterality: N/A;   HEMORRHOID SURGERY     HYSTEROSCOPY WITH D & C N/A 10/14/2021   Procedure: DILATATION AND CURETTAGE /HYSTEROSCOPY;  Surgeon: Christeen Douglas, MD;  Location: [facility] ORS;  Service: Gynecology;  Laterality: N/A;   IMPLANTABLE CONTACT LENS IMPLANTATION     LYSIS OF ADHESION  10/14/2021   Procedure: LYSIS OF VAGINAL ADHESIONS;  Surgeon: Christeen Douglas, MD;  Location: [facility] ORS;  Service: Gynecology;;   TONSILLECTOMY     TUBAL LIGATION      Family Psychiatric History: Please see initial evaluation for full details. I have reviewed the history. No updates at this time.     Family History:  Family History  Problem Relation Age of Onset   Alcohol abuse Mother    Cancer Mother    Heart disease Father    Kidney failure Maternal Aunt     Social History:  Social History   Socioeconomic History   Marital status: Divorced    Spouse name: Not on file   Number of children: Not on file   Years of education: Not on file   Highest education level: Some college, no degree  Occupational History   Not on file  Tobacco Use   Smoking status: Former    Current packs/day: 0.00    Types: Cigarettes    Quit date: 07/27/1993    Years since quitting: 29.4   Smokeless tobacco: Never   Tobacco comments:    quit 20 years  Vaping Use   Vaping status: Never Used  Substance and Sexual Activity   Alcohol use: No    Alcohol/week: 0.0 standard drinks of alcohol   Drug use: No   Sexual activity: Not Currently    Birth control/protection: Post-menopausal  Other Topics  Concern   Not on file  Social History Narrative   Lives alone   Social Drivers of Health   Financial Resource Strain: Medium Risk (06/07/2022)   Received from [facility], [facility]   Overall Financial Resource Strain (CARDIA)    Difficulty of Paying Living Expenses: Somewhat hard  Food Insecurity: No Food Insecurity (06/07/2022)   Received from [facility], [facility]   Hunger Vital Sign    Worried About Running Out of Food in the Last Year: Never true    Ran Out of Food in the  Last Year: Never true  Transportation Needs: No Transportation Needs (06/07/2022)   Received from [facility], [facility]   [facility] - Transportation    In the past 12 months, has lack of transportation kept you from medical appointments or from getting medications?: No    Lack of Transportation (Non-Medical): No  Physical Activity: Inactive (12/01/2021)   Received from [facility], [facility]   Exercise Vital Sign    Days of Exercise per Week: 0 days    Minutes of Exercise per Session: 0 min  Stress: Not on file  Social Connections: Socially Isolated (12/01/2021)   Received from [facility], [facility]   Social Connection and Isolation Panel [NHANES]    Frequency of Communication with Friends and Family: Never    Frequency of Social Gatherings with Friends and Family: Never    Attends Religious Services: Never    Database administrator or Organizations: No    Attends Banker Meetings: Never    Marital Status: Divorced    Allergies:  Allergies  Allergen Reactions   Amoxicillin-Pot Clavulanate Rash   Metformin And Related Nausea And Vomiting   Sulfa Antibiotics Nausea And Vomiting   Hydrocortisone-Iodoquinol  [Hydrocortisone-Iodoquinol] Rash    Patient reports that she does not believe that she has allergy  to this, that she was receiving many medications at once.    Metabolic Disorder Labs: Lab Results  Component Value Date   HGBA1C 6.6 (H) 10/27/2021   MPG 142.72 10/27/2021   MPG 145.59 08/11/2021   No results found for: "PROLACTIN" No results found for: "CHOL", "TRIG", "HDL", "CHOLHDL", "VLDL", "LDLCALC" Lab Results  Component Value Date   TSH 0.061 (L) 10/27/2021   TSH 11.315 (H) 01/09/2020    Therapeutic Level Labs: No results found for: "LITHIUM" No results found for: "VALPROATE" No results found for: "CBMZ"  Current Medications: Current Outpatient Medications  Medication Sig Dispense Refill   acetaminophen (TYLENOL) 500 MG tablet Take 500 mg by mouth every 6 (six) hours as needed for moderate pain.     Cholecalciferol (VITAMIN D3) 50 MCG (2000 UT) TABS Take 2 tablets by mouth daily.     clobetasol ointment (TEMOVATE) 0.05 % APPLY ONE APPLICATION TOPICALLY DAILY. VAGINA 60 g 0   clopidogrel (PLAVIX) 75 MG tablet Take 1 tablet (75 mg total) by mouth at bedtime. 30 tablet 0   cyanocobalamin (VITAMIN B12) 1000 MCG tablet Take 1,000 mcg by mouth daily.     docusate sodium (COLACE) 100 MG capsule Take 1 capsule (100 mg total) by mouth daily. 30 capsule 0   empagliflozin (JARDIANCE) 25 MG TABS tablet Take by mouth.     ferrous sulfate 325 (65 FE) MG tablet Take 1 tablet (325 mg total) by mouth daily with breakfast. (Patient taking differently: Take 325 mg by mouth daily.) 30 tablet 0   insulin glargine (LANTUS) 100 UNIT/ML injection Inject 0.15 mLs (15 Units total) into the skin at bedtime. (Patient taking differently: Inject 15 Units into the skin daily. morning) 10 mL 0   Insulin Syringe-Needle U-100 (INSULIN SYRINGE 1CC/31GX5/16") 31G X 5/16" 1 ML MISC USE ONE SYRINGE TWICE DAILY AS DIRECTED WITH INSULIN VIALS     levothyroxine (SYNTHROID) 75 MCG tablet Take 1 tablet (75 mcg total) by mouth daily before breakfast. 30 tablet 0   lidocaine-prilocaine (EMLA) cream Apply 1  Application topically as needed. To your vulva for pain control 30 g 0  lisinopril (ZESTRIL) 20 MG tablet Take 20 mg by mouth daily.     NOVOLOG 100 UNIT/ML injection Inject 6 Units into the skin in the morning and at bedtime. (Patient taking differently: Inject 4-6 Units into the skin in the morning and at bedtime. 4 units in the morning and 6 units at bedtime) 10 mL 0   nystatin cream (MYCOSTATIN) Apply 1 application topically as needed (yeast).     PARoxetine (PAXIL) 20 MG tablet Take 1 tablet (20 mg total) by mouth daily. 90 tablet 0   rosuvastatin (CRESTOR) 40 MG tablet Take 1 tablet (40 mg total) by mouth at bedtime. 90 tablet 0   torsemide (DEMADEX) 10 MG tablet Take 1 tablet (10 mg total) by mouth every other day. 30 tablet 0   [START ON 02/12/2023] buPROPion (WELLBUTRIN XL) 150 MG 24 hr tablet Take 3 tablets (450 mg total) by mouth daily. 270 tablet 0   No current facility-administered medications for this visit.     Musculoskeletal: Strength & Muscle Tone: within normal limits Gait & Station: normal Patient leans: N/A  Psychiatric Specialty Exam: Review of Systems  Psychiatric/Behavioral:  Positive for dysphoric mood. Negative for agitation, behavioral problems, confusion, decreased concentration, hallucinations, self-injury, sleep disturbance and suicidal ideas. The patient is not nervous/anxious and is not hyperactive.   All other systems reviewed and are negative.   Blood pressure 107/64, pulse (!) 108, temperature 97.7 F (36.5 C), temperature source Skin, height 5' (1.524 m), weight 126 lb 12.8 oz (57.5 kg).Body mass index is 24.76 kg/m.  General Appearance: Well Groomed  Eye Contact:  Good  Speech:  Clear and Coherent  Volume:  Normal  Mood:   good  Affect:  Appropriate, Congruent, and Full Range  Thought Process:  Coherent  Orientation:  Full (Time, Place, and Person)  Thought Content: Logical   Suicidal Thoughts:  No  Homicidal Thoughts:  No  Memory:  Immediate;    Good  Judgement:  Good  Insight:  Good  Psychomotor Activity:  Normal  Concentration:  Concentration: Good and Attention Span: Good  Recall:  Good  Fund of Knowledge: Good  Language: Good  Akathisia:  No  Handed:  Right  AIMS (if indicated): not done  Assets:  Communication Skills Desire for Improvement  ADL's:  Intact  Cognition: WNL  Sleep:  Fair   Screenings: PHQ2-9    Flowsheet Row Office Visit from [facility]/06/2022 in [facility]  PHQ-2 Total Score 1  PHQ-9 Total Score 5      Flowsheet Row ED from 10/30/2022 in [facility] Emergency Department at [facility] Admission (Discharged) from 10/14/2021 in [facility] ED from 08/10/2021 in [facility] Emergency Department at [facility]  C-SSRS RISK CATEGORY No Risk No Risk No Risk        Assessment and Plan:  CIYA FRANCKOWIAK is a 77 y.o. year old female with a history of depression, endometrial adenocarcinoma s/p hysterectomy and bilateral SPO, not on chemotherapy or radiation secondary to lichen planus, right occipital infarcts in 2021, cognitive improvements, type II diabetes, hypertension, hyperlipidemia, LBBB, who is referred for depression.   1. MDD (major depressive disorder), recurrent episode, mild (HCC) Acute stressors include: financial strain in the setting of buying a car, loss of her close friend  Other stressors include: absence of nurturing except her maternal grandfather (her mother had alcohol use issues) History: depression since child, originally on Paxil 20 mg daily, bupropion 450 mg daily  And panic notable for full affect during the visit, and she has been functioning well despite occasional episodes of feeling down.  Will continue current dose of Paxil, bupropion to target depression.   2. Neurocognitive deficits # Hallucinations Functional Status   IADL: Independent in the following: managing finances, medications,  driving, cooking (only occasionally)           Requires assistance with the following:  ADL  Independent in the following: bathing and hygiene, feeding, continence, grooming and toileting, walking          Requires assistance with the following: Folate, Vitamin B12 (wnl 11/2022), TSH wnl 04/2022 Images Brain: Chronic white matter ischemic change. No evidence of acute infarction, hemorrhage, hydrocephalus, extra-axial collection or mass lesion/mass effect. Neuropsych assessment: SLUMS 27 on 11/2021, MOCA 22/30 09/2022 Etiology: VaD    IADL is independent, although she does have some validity deficits as evidenced by MoCA. She has a history of chronic hallucinations, likely secondary to NPS, and she denies significant concern about this.  Will not start antipsychotics at this time given its risk outweighs benefit.   Plan  Continue Paxil 20 mg daily Continue bupropion 450 mg daily   Next appointment: 2/4 at 4 pm Plan to check labs (vitamin D) at the next visit    The patient demonstrates the following risk factors for suicide: Chronic risk factors for suicide include: psychiatric disorder of depression . Acute risk factors for suicide include: unemployment and loss (financial, interpersonal, professional). Protective factors for this patient include: coping skills and hope for the future. Considering these factors, the overall suicide risk at this point appears to be low. Patient is appropriate for outpatient follow up.     Collaboration of Care: Collaboration of Care: Other reviewed notes in Epic  Patient/Guardian was advised Release of Information must be obtained prior to any record release in order to collaborate their care with an outside provider. Patient/Guardian was advised if they have not already done so to contact the registration department to sign all necessary forms in order for [place] to release information regarding their care.   Consent: Patient/Guardian gives verbal consent for  treatment and assignment of benefits for services provided during this visit. Patient/Guardian expressed understanding and agreed to proceed.   The duration of the time spent on the following activities on the date of the encounter was 30 minutes.   Preparing to see the patient (e.g., review of test, records)  Obtaining and/or reviewing separately obtained history  Performing a medically necessary exam and/or evaluation  Counseling and educating the patient/family/caregiver  Ordering medications, tests, or procedures  Referring and communicating with other healthcare professionals (when not reported separately)  Documenting clinical information in the electronic or paper health record  Independently interpreting results of tests/labs and communication of results to the family or caregiver  Care coordination (when not reported separately)   Neysa Hotter, MD 12/29/2022, 4:50 PM

## 2022-12-26 ENCOUNTER — Encounter: Payer: Self-pay | Admitting: Physical Therapy

## 2022-12-26 ENCOUNTER — Ambulatory Visit: Payer: Medicare Other | Admitting: Physical Therapy

## 2022-12-26 DIAGNOSIS — R2681 Unsteadiness on feet: Secondary | ICD-10-CM

## 2022-12-26 DIAGNOSIS — M6281 Muscle weakness (generalized): Secondary | ICD-10-CM

## 2022-12-26 NOTE — Therapy (Signed)
OUTPATIENT PHYSICAL THERAPY TREATMENT   Patient Name: Abigail Walker MRN: [id number] DOB:August 08, 1945, 77 y.o., female Today's Date: 12/26/2022  END OF SESSION:  PT End of Session - 12/26/22 1113     Visit Number 3    Number of Visits 25    Date for PT Re-Evaluation 02/13/23    Authorization Type eval: 12/19/22    PT Start Time 1115    PT Stop Time 1158    PT Time Calculation (min) 43 min    Activity Tolerance Patient tolerated treatment well    Behavior During Therapy [facility] for tasks assessed/performed              Past Medical History:  Diagnosis Date   Adult hypothyroidism 06/16/2013   Anemia    Aortic atherosclerosis (HCC)    Arthritis    Benign hypertension 06/16/2013   Carotid artery disease (HCC)    a.) carotid US 02/10/2020: 1-39% BICA; b.) carotid US 09/22/2021: 1-39% BICA   [name] syndrome    CKD (chronic kidney disease), stage III (HCC)    Coronary artery calcification seen on CT scan    Depression    Esophageal spasm    Esophageal stricture    GERD (gastroesophageal reflux disease)    HBV (hepatitis B virus) infection    High blood pressure    HLD (hyperlipidemia)    Hypothyroidism    LBBB (left bundle branch block)    Lichen planus    Long term current use of antithrombotics/antiplatelets    a.) clopidogrel   Mixed incontinence    OSA on CPAP 06/16/2013   Pneumonia 2023   RLS (restless legs syndrome)    Secondary hyperparathyroidism of renal origin (HCC)    Skin cancer, basal cell    Sleep apnea    Stroke (HCC)    Thyroid disease    Type 2 diabetes mellitus treated with insulin (HCC)    Past Surgical History:  Procedure Laterality Date   CATARACT EXTRACTION Bilateral    COLONOSCOPY  2009   COLONOSCOPY WITH PROPOFOL N/A 11/19/2021   Procedure: COLONOSCOPY WITH PROPOFOL;  Surgeon: Regis Bill, MD;  Location: [facility] ENDOSCOPY;  Service: Endoscopy;  Laterality: N/A;  DM   DILATATION & CURETTAGE/HYSTEROSCOPY WITH MYOSURE  10/14/2021    Procedure: MYOSURE POLYPECTOMY;  Surgeon: Christeen Douglas, MD;  Location: [facility] ORS;  Service: Gynecology;;   ESOPHAGOGASTRODUODENOSCOPY     2009, 2011   ESOPHAGOGASTRODUODENOSCOPY (EGD) WITH PROPOFOL N/A 06/08/2015   Procedure: ESOPHAGOGASTRODUODENOSCOPY (EGD) WITH PROPOFOL;  Surgeon: Wallace Cullens, MD;  Location: [facility] ENDOSCOPY;  Service: Gastroenterology;  Laterality: N/A;   ESOPHAGOGASTRODUODENOSCOPY (EGD) WITH PROPOFOL N/A 11/19/2021   Procedure: ESOPHAGOGASTRODUODENOSCOPY (EGD) WITH PROPOFOL;  Surgeon: Regis Bill, MD;  Location: [facility] ENDOSCOPY;  Service: Endoscopy;  Laterality: N/A;   HEMORRHOID SURGERY     HYSTEROSCOPY WITH D & C N/A 10/14/2021   Procedure: DILATATION AND CURETTAGE /HYSTEROSCOPY;  Surgeon: Christeen Douglas, MD;  Location: [facility] ORS;  Service: Gynecology;  Laterality: N/A;   IMPLANTABLE CONTACT LENS IMPLANTATION     LYSIS OF ADHESION  10/14/2021   Procedure: LYSIS OF VAGINAL ADHESIONS;  Surgeon: Christeen Douglas, MD;  Location: [facility] ORS;  Service: Gynecology;;   TONSILLECTOMY     TUBAL LIGATION     Patient Active Problem List   Diagnosis Date Noted   [employer] Federation of Gyne[employer] and Obstetric[employer] (FIGO) malignant neoplasm of endometrium stage IB (HCC) 01/19/2022   Status post surgery 10/14/2021   Aspiration pneumonia (HCC) 08/11/2021  CAP (community acquired pneumonia) 08/10/2021   Chest pain 08/10/2021   Dysphagia 08/10/2021   Type 2 diabetes mellitus with peripheral neuropathy (HCC) 08/10/2021   PAD (peripheral artery disease) (HCC) 02/22/2020   Hypoglycemia due to type 2 diabetes mellitus (HCC)    Hypothermia    Dehydration    Hypoglycemia 01/09/2020   Moderate protein-calorie malnutrition (HCC) 01/08/2020   Fall    Hyponatremia    Hyperglycemia 12/05/2019   Acute lower UTI 12/05/2019   Depression    Lumbar compression fracture (HCC)    Weakness of left foot 09/14/2019   Hyperlipidemia 08/13/2019   Hypercalcemia 05/01/2019   Leukopenia  01/17/2019   Stage 3 chronic kidney disease (HCC) 01/17/2019   Benign hypertensive kidney disease with chronic kidney disease 01/16/2019   Hyposmolality and/or hyponatremia 01/16/2019   Secondary hyperparathyroidism of renal origin (HCC) 01/16/2019   Carotid artery disease (HCC) 08/10/2017   Chronic midline low back pain without sciatica 03/16/2017   Primary osteoarthritis of both hands 01/16/2017   Benign essential tremor 08/12/2015   MGUS (monoclonal gammopathy of unknown significance) 07/08/2015   Mixed incontinence 11/09/2014   Atrophic vaginitis 07/28/2014   Encounter for general adult medical examination without abnormal findings 05/28/2014   Major depression in remission (HCC) 11/27/2013   Morbid obesity (HCC) 11/16/2013   Microalbuminuria 08/06/2013   Breath shortness 08/02/2013   Acid reflux 06/16/2013   Benign hypertension 06/16/2013   Type 2 diabetes mellitus (HCC) 06/16/2013   Hypothyroidism 06/16/2013   Obstructive apnea 06/16/2013   Hyperlipidemia associated with type 2 diabetes mellitus (HCC) [id number]/63/8756   Oral lichen planus 05/10/2011   Lichen planus 11/09/2010    PCP: Lauro Regulus, MD  REFERRING PROVIDER: Janice Coffin, PA-C   REFERRING DIAG: R29.6 (ICD-10-CM) - Repeated falls   RATIONALE FOR EVALUATION AND TREATMENT: Rehabilitation  THERAPY DIAG: Unsteadiness on feet  Muscle weakness (generalized)  ONSET DATE: Approximately 2 years  FOLLOW-UP APPT SCHEDULED WITH REFERRING PROVIDER: Yes   PERTINENT HISTORY:  Pt referred by neurology for issues with imbalance and falls. She states that she has been having difficulty with her balance for approximately 2 years. She reports dragging her feet occasionally when she walks. She has had 3 falls in the last 6 months but denies any significant injuries except for skin lacerations. She has a history of small infarcts involving right occipital lobe with left homonymous hemianopsia. Pt actually reports she is  lacking midline vision bilaterally but per chart has poor insight into her deficits. No new neurologic symptoms recently. She has a medical history significant for diabetes mellitus with complications of stage III chronic kidney disease, depression, hypothyroidism, hypertension, cognitive changes, recurrent falls and GERD. Also has a history of endometrial adenocarcinoma, s/p hysterectomy and bilateral SPO, not on chemotherapy or radiation secondary to lichen planus.  MRI brain with and without contrast 01/02/2020: No acute intracranial abnormality. Moderate parenchymal volume loss and chronic microvascular ischemic white matter disease. No acute fracture or traumatic listhesis of the cervical spine. Multilevel degenerative changes of the cervical spine as described above. Cervical and intracranial atherosclerosis.   MRI brain 11/27/22 without results yet  CT Cervical Spine without contrast 12/05/2019: No acute fracture or traumatic listhesis of the cervical spine. Multilevel degenerative changes of the cervical spine as described above. Cervical and intracranial atherosclerosis. Emphysema (ICD10-J43.9).   CT thoracic Spine without contrast 12/05/2019: L1 compression deformity, better detailed dedicated lumbar imaging. Slightly sclerotic endplate change and central cupping in the inferior endplate T12 with a rounded lucency  and extension of nitrogenous gas from the disc space is favored to reflect an acute to subacute Schmorl's node formation rather than true compression deformity. No other acute thoracic spine fracture or vertebral body height loss. Multilevel degenerative changes of the thoracic spine without significant spinal canal stenosis or neural foraminal narrowing within the thoracic levels. 3 mm solid nodule in the anterosuperior segment right upper lobe.   Pain: Yes, "arthritis" pain in spine Numbness/Tingling: Yes, bilateral LE neuropathy, some bilateral hand tingling  Focal Weakness: Yes, L  sided weakness per subjective report Recent changes in overall health/medication: Yes, worsening kidney function. Recently started Jardiance.  Prior history of physical therapy for balance:  No Dominant hand: left, uses both hands Imaging: Yes, see above  PRECAUTIONS: Fall  WEIGHT BEARING RESTRICTIONS: No  FALLS: Has patient fallen in last 6 months? Yes. Number of falls 3 , Directional pattern for falls: Yes to the left  Living Environment Lives with: lives alone Lives in: House/apartment, double wide mobile home Stairs: single leg home, ramp on front to enter, 8 steps on back with bilateral rails that she can reach simulataneously Has following equipment at home: Single point cane, Walker - 2 wheeled, Environmental consultant - 4 wheeled, and Wheelchair (manual) Tub shower (no grab bars), shower seat  Prior level of function: Independent  Occupational demands: Retired  Presenter, broadcasting: Social research officer, government, watching sports  Patient Goals: Improve balance. "I need to learn how to slow down."   OBJECTIVE:   Patient Surveys  FOTO: 49, predicted improvement to 60 ABC: 77.5%   Posture: Forward head with upper thoracic posture   LE MMT: MMT (out of 5) Right  Left   Hip flexion 4 4  Hip extension    Hip abduction (seated) 4 4  Hip adduction (setaed) 4 4  Hip internal rotation    Hip external rotation    Knee flexion (seated) 5 5  Knee extension 5 5  Ankle dorsiflexion 5 5  Ankle plantarflexion    (* = pain; Blank rows = not tested)  UE strength testing WNL and symmetrical  Transfers: Assistive device utilized: None  Sit to stand: Complete Independence Stand to sit: Complete Independence Chair to chair: Complete Independence Floor:  Not performed  Curb:  Deferred  Stairs: Level of Assistance: Modified independence Stair Negotiation Technique: Step to Pattern with Single Rail on Right Number of Stairs: 4  Height of Stairs: 6"  Comments: Reciprocal ascending and step-to  descending  Gait: Gait pattern: decreased step length- Right and decreased step length- Left Distance walked: 100' Assistive device utilized: None Level of assistance: Complete Independence Comments: Decreased self-selected speed  Clinical Test of Sensory Interaction for Balance (CTSIB): CONDITION TIME  Eyes open, firm surface 30 seconds  Eyes closed, firm surface 7 seconds  Eyes open, foam surface 30 seconds  Eyes closed, foam surface 1.8 seconds    Functional Outcome Measures  Results Comments  BERG 51/56 Mild balance deficits  FGA 17/30   TUG 8.3 seconds WNL  5TSTS 11.0 seconds WNL  10 Meter Gait Speed Self-selected: 16.8s = 0.60 m/s; Fastest: 8.5s = 1.18 m/s Decreased self selected gait speed  MiniBESTest 17/28    (Blank rows = not tested)     TODAY'S TREATMENT     SUBJECTIVE STATEMENT:   Patient reports soreness today following working in garden yesterday. She reports soreness in her back and shoulders/periscapular region, legs/calves. Pt reports her foot slipped off of shovel and her legs abruptly went apart; she reports pain along pubic  region/genitals following abrupt hip abduction in standing at the time. No fall at the time. Pt forgot about her exercises until yesterday - she completed exercises yesterday one time that day. Pt sometimes feels that she is losing balance forward/teetering forward with 3-way kick and minisquats.     Therapeutic Exercise - improved strength as needed to improve performance of CKC activities/functional movements and as needed for power production to prevent fall during episode of large postural perturbation    PATIENT EDUCATION: Discussed with patient technique of home exercises and use of counter support for safety. We also reiterated benefit of use of cane when walking for long distances or on uneven terrain.    *next visit* NuStep; Level 2; decreased to 1 for last 2 minutes, x 5 minutes - for improved soft tissue mobility and  increased tissue temperature to improve muscle performance   -subjective gathered during this time   *not today* 3-way kick; 1x8 with bilat LE Minisquat, in // bars; 1x10  -for HEP review   Neuromuscular Re-education - for improved sensory integration, static and dynamic postural control, equilibrium and non-equilibrium coordination as needed for negotiating home and community environment and stepping over obstacles  Completion of FGA  -reviewed results with patient and updated goals  Forward step-over (then return to bilateral standing with target in front), 5-lb ankle weight; 1x10 with each LE Standing feet together, eyes closed; 2 x 30 sec Standing on Aire[employer]; eyes closed; 1 x 10 sec, 1 x 20 sec  In // bars: dynamic march; 4x D/B  -PT SBA    PATIENT EDUCATION:  Education details: see above for patient education details Person educated: Patient Education method: Explanation Education comprehension: verbalized understanding   HOME EXERCISE PROGRAM:  Access Code: [email or web address] URL: [email or web address] Date: 12/21/2022 Prepared by: Consuela Mimes  Exercises - Forward Step Over with Counter Support  - 2 x daily - 7 x weekly - 2 sets - 10 reps - Standing 3-Way Kick  - 2 x daily - 7 x weekly - 2 sets - 10 reps - Mini Squat with Counter Support  - 2 x daily - 7 x weekly - 2 sets - 10 reps   ASSESSMENT:  CLINICAL IMPRESSION: Patient is well below cut-off for fall risk for FGA. Pt is most challenged with gait c horizontal head turns and with eyes-closed tasks. Patient demonstrates improved cognizance of toe clearance, but she does intermittently have incoordination of LLE during swing phase and mild scuffing during mid-swing. We reviewed guidelines for home safety and reiterated use of cane to improve gait stability. Pt needs continued work on LE strengthening, postural control training, reactive and dynamic balance exercise. Pt will continue to benefit from skilled  PT services to address deficits and improve function.   OBJECTIVE IMPAIRMENTS: decreased balance, decreased cognition, and decreased strength.   ACTIVITY LIMITATIONS: squatting, stairs, and transfers  PARTICIPATION LIMITATIONS: cleaning, laundry, shopping, and community activity  PERSONAL FACTORS: Age, Past/current experiences, Time since onset of injury/illness/exacerbation, and 3+ comorbidities: CVA, visual deficits, cognitive deficits, hearing impairment, DM, and CKD  are also affecting patient's functional outcome.   REHAB POTENTIAL: Fair    CLINICAL DECISION MAKING: Unstable/unpredictable  EVALUATION COMPLEXITY: High   GOALS: Goals reviewed with patient? No  SHORT TERM GOALS: Target date: 01/30/2023  Pt will be independent with HEP in order to improve strength and balance in order to decrease fall risk and improve function at home. Baseline:  Goal status: INITIAL   LONG TERM GOALS: Target date: 03/13/2023  Pt will increase FOTO to at least 60 to demonstrate significant improvement in function at home related to balance  Baseline: 12/19/22: 49/60 Goal status: INITIAL  2.  Pt will improve BERG by at least 3 points in order to demonstrate clinically significant improvement in balance.   Baseline: 12/19/22: 51/56 Goal status: INITIAL  3.  Pt will improve mini-BESTest by at least 4 points in order to demonstrate clinically significant improvement in balance and decreased risk for falls       Baseline: 12/19/22: To be completed next visit.    12/21/22: 17/28 Goal status: INITIAL  4. Pt will increase self-selected 48m gait speed to at least 0.80 m/s in order to demonstrate clinically significant improvement in community ambulation.   Baseline: 12/19/22: Self-selected: 16.8s = 0.60 m/s Goal status: INITIAL  5.  Pt will improve FGA by at least 5 points in order to demonstrate clinically significant improvement in balance and decreased risk for falls       Baseline: 12/26/22:  17/30 Goal status: INITIAL   PLAN: PT FREQUENCY: 2x/week  PT DURATION: 12 weeks  PLANNED INTERVENTIONS: Therapeutic exercises, Therapeutic activity, Neuromuscular re-education, Balance training, Gait training, Patient/Family education, Self Care, Joint mobilization, Joint manipulation, Vestibular training, Canalith repositioning, Orthotic/Fit training, DME instructions, Dry Needling, Electrical stimulation, Spinal manipulation, Spinal mobilization, Cryotherapy, Moist heat, Taping, Traction, Ultrasound, Ionotophoresis 4mg /ml Dexamethasone, Manual therapy, and Re-evaluation.  PLAN FOR NEXT SESSION: Continue with postural control training, integrating eyes-closed tasks. Continue with LE strengthening and obstacle negotiation work; strategies for foot clearance and decreased shuffling.     Consuela Mimes, PT, DPT #[id number]  Gertie Exon 12/26/2022, 11:13 AM

## 2022-12-28 ENCOUNTER — Ambulatory Visit: Payer: Medicare Other | Admitting: Physical Therapy

## 2022-12-28 ENCOUNTER — Encounter: Payer: Self-pay | Admitting: Physical Therapy

## 2022-12-28 DIAGNOSIS — R2681 Unsteadiness on feet: Secondary | ICD-10-CM

## 2022-12-28 DIAGNOSIS — M6281 Muscle weakness (generalized): Secondary | ICD-10-CM

## 2022-12-28 NOTE — Therapy (Signed)
OUTPATIENT PHYSICAL THERAPY TREATMENT   Patient Name: Abigail Walker MRN: [id number] DOB:April 25, 1945, 77 y.o., female Today's Date: 12/28/2022  END OF SESSION:  PT End of Session - 12/28/22 1035     Visit Number 4    Number of Visits 25    Date for PT Re-Evaluation 02/13/23    Authorization Type eval: 12/19/22    PT Start Time 1036    PT Stop Time 1120    PT Time Calculation (min) 44 min    Activity Tolerance Patient tolerated treatment well    Behavior During Therapy [facility] for tasks assessed/performed               Past Medical History:  Diagnosis Date   Adult hypothyroidism 06/16/2013   Anemia    Aortic atherosclerosis (HCC)    Arthritis    Benign hypertension 06/16/2013   Carotid artery disease (HCC)    a.) carotid US 02/10/2020: 1-39% BICA; b.) carotid US 09/22/2021: 1-39% BICA   [name] syndrome    CKD (chronic kidney disease), stage III (HCC)    Coronary artery calcification seen on CT scan    Depression    Esophageal spasm    Esophageal stricture    GERD (gastroesophageal reflux disease)    HBV (hepatitis B virus) infection    High blood pressure    HLD (hyperlipidemia)    Hypothyroidism    LBBB (left bundle branch block)    Lichen planus    Long term current use of antithrombotics/antiplatelets    a.) clopidogrel   Mixed incontinence    OSA on CPAP 06/16/2013   Pneumonia 2023   RLS (restless legs syndrome)    Secondary hyperparathyroidism of renal origin (HCC)    Skin cancer, basal cell    Sleep apnea    Stroke (HCC)    Thyroid disease    Type 2 diabetes mellitus treated with insulin (HCC)    Past Surgical History:  Procedure Laterality Date   CATARACT EXTRACTION Bilateral    COLONOSCOPY  2009   COLONOSCOPY WITH PROPOFOL N/A 11/19/2021   Procedure: COLONOSCOPY WITH PROPOFOL;  Surgeon: Regis Bill, MD;  Location: [facility];  Service: Endoscopy;  Laterality: N/A;  DM   DILATATION & CURETTAGE/HYSTEROSCOPY WITH MYOSURE   10/14/2021   Procedure: MYOSURE POLYPECTOMY;  Surgeon: Christeen Douglas, MD;  Location: [facility];  Service: Gynecology;;   ESOPHAGOGASTRODUODENOSCOPY     2009, 2011   ESOPHAGOGASTRODUODENOSCOPY (EGD) WITH PROPOFOL N/A 06/08/2015   Procedure: ESOPHAGOGASTRODUODENOSCOPY (EGD) WITH PROPOFOL;  Surgeon: Wallace Cullens, MD;  Location: [facility] ENDOSCOPY;  Service: Gastroenterology;  Laterality: N/A;   ESOPHAGOGASTRODUODENOSCOPY (EGD) WITH PROPOFOL N/A 11/19/2021   Procedure: ESOPHAGOGASTRODUODENOSCOPY (EGD) WITH PROPOFOL;  Surgeon: Regis Bill, MD;  Location: [facility];  Service: Endoscopy;  Laterality: N/A;   HEMORRHOID SURGERY     HYSTEROSCOPY WITH D & C N/A 10/14/2021   Procedure: DILATATION AND CURETTAGE /HYSTEROSCOPY;  Surgeon: Christeen Douglas, MD;  Location: [facility];  Service: Gynecology;  Laterality: N/A;   IMPLANTABLE CONTACT LENS IMPLANTATION     LYSIS OF ADHESION  10/14/2021   Procedure: LYSIS OF VAGINAL ADHESIONS;  Surgeon: Christeen Douglas, MD;  Location: [facility] OR[facility];  Service: Gynecology;;   TONSILLECTOMY     TUBAL LIGATION     Patient Active Problem List   Diagnosis Date Noted   [employer] Gyne[employer] Ob[employer] (FIGO) malignant neoplasm of endometrium stage IB (HCC) 01/19/2022   Status post surgery 10/14/2021   Aspiration pneumonia (HCC) 08/11/2021  CAP (community acquired pneumonia) 08/10/2021   Chest pain 08/10/2021   Dysphagia 08/10/2021   Type 2 diabetes mellitus with peripheral neuropathy (HCC) 08/10/2021   PAD (peripheral artery disease) (HCC) 02/22/2020   Hypoglycemia due to type 2 diabetes mellitus (HCC)    Hypothermia    Dehydration    Hypoglycemia 01/09/2020   Moderate protein-calorie malnutrition (HCC) 01/08/2020   Fall    Hyponatremia    Hyperglycemia 12/05/2019   Acute lower UTI 12/05/2019   Depression    Lumbar compression fracture (HCC)    Weakness of left foot 09/14/2019   Hyperlipidemia 08/13/2019   Hypercalcemia 05/01/2019    Leukopenia 01/17/2019   Stage 3 chronic kidney disease (HCC) 01/17/2019   Benign hypertensive kidney disease with chronic kidney disease 01/16/2019   Hyposmolality and/or hyponatremia 01/16/2019   Secondary hyperparathyroidism of renal origin (HCC) 01/16/2019   Carotid artery disease (HCC) 08/10/2017   Chronic midline low back pain without sciatica 03/16/2017   Primary osteoarthritis of both hands 01/16/2017   Benign essential tremor 08/12/2015   MGUS (monoclonal gammopathy of unknown significance) 07/08/2015   Mixed incontinence 11/09/2014   Atrophic vaginitis 07/28/2014   Encounter for general adult medical examination without abnormal findings 05/28/2014   Major depression in remission (HCC) 11/27/2013   Morbid obesity (HCC) 11/16/2013   Microalbuminuria 08/06/2013   Breath shortness 08/02/2013   Acid reflux 06/16/2013   Benign hypertension 06/16/2013   Type 2 diabetes mellitus (HCC) 06/16/2013   Hypothyroidism 06/16/2013   Obstructive apnea 06/16/2013   Hyperlipidemia associated with type 2 diabetes mellitus (HCC) 09/81/1914   Oral lichen planus 05/10/2011   Lichen planus 11/09/2010    PCP: Lauro Regulus, MD  REFERRING PROVIDER: Janice Coffin, PA-C   REFERRING DIAG: R29.6 (ICD-10-CM) - Repeated falls   RATIONALE FOR EVALUATION AND TREATMENT: Rehabilitation  THERAPY DIAG: Unsteadiness on feet  Muscle weakness (generalized)  ONSET DATE: Approximately 2 years  FOLLOW-UP APPT SCHEDULED WITH REFERRING PROVIDER: Yes   PERTINENT HISTORY:  Pt referred by neurology for issues with imbalance and falls. She states that she has been having difficulty with her balance for approximately 2 years. She reports dragging her feet occasionally when she walks. She has had 3 falls in the last 6 months but denies any significant injuries except for skin lacerations. She has a history of small infarcts involving right occipital lobe with left homonymous hemianopsia. Pt actually  reports she is lacking midline vision bilaterally but per chart has poor insight into her deficits. No new neurologic symptoms recently. She has a medical history significant for diabetes mellitus with complications of stage III chronic kidney disease, depression, hypothyroidism, hypertension, cognitive changes, recurrent falls and GERD. Also has a history of endometrial adenocarcinoma, s/p hysterectomy and bilateral SPO, not on chemotherapy or radiation secondary to lichen planus.  MRI brain with and without contrast 01/02/2020: No acute intracranial abnormality. Moderate parenchymal volume loss and chronic microvascular ischemic white matter disease. No acute fracture or traumatic listhesis of the cervical spine. Multilevel degenerative changes of the cervical spine as described above. Cervical and intracranial atherosclerosis.   MRI brain 11/27/22 without results yet  CT Cervical Spine without contrast 12/05/2019: No acute fracture or traumatic listhesis of the cervical spine. Multilevel degenerative changes of the cervical spine as described above. Cervical and intracranial atherosclerosis. Emphysema (ICD10-J43.9).   CT thoracic Spine without contrast 12/05/2019: L1 compression deformity, better detailed dedicated lumbar imaging. Slightly sclerotic endplate change and central cupping in the inferior endplate T12 with a rounded lucency  and extension of nitrogenous gas from the disc space is favored to reflect an acute to subacute Schmorl's node formation rather than true compression deformity. No other acute thoracic spine fracture or vertebral body height loss. Multilevel degenerative changes of the thoracic spine without significant spinal canal stenosis or neural foraminal narrowing within the thoracic levels. 3 mm solid nodule in the anterosuperior segment right upper lobe.   Pain: Yes, "arthritis" pain in spine Numbness/Tingling: Yes, bilateral LE neuropathy, some bilateral hand tingling  Focal  Weakness: Yes, L sided weakness per subjective report Recent changes in overall health/medication: Yes, worsening kidney function. Recently started Jardiance.  Prior history of physical therapy for balance:  No Dominant hand: left, uses both hands Imaging: Yes, see above  PRECAUTIONS: Fall  WEIGHT BEARING RESTRICTIONS: No  FALLS: Has patient fallen in last 6 months? Yes. Number of falls 3 , Directional pattern for falls: Yes to the left  Living Environment Lives with: lives alone Lives in: House/apartment, double wide mobile home Stairs: single leg home, ramp on front to enter, 8 steps on back with bilateral rails that she can reach simulataneously Has following equipment at home: Single point cane, Walker - 2 wheeled, Environmental consultant - 4 wheeled, and Wheelchair (manual) Tub shower (no grab bars), shower seat  Prior level of function: Independent  Occupational demands: Retired  Presenter, broadcasting: Social research officer, government, watching sports  Patient Goals: Improve balance. "I need to learn how to slow down."   OBJECTIVE:   Patient Surveys  FOTO: 49, predicted improvement to 60 ABC: 77.5%   Posture: Forward head with upper thoracic posture   LE MMT: MMT (out of 5) Right  Left   Hip flexion 4 4  Hip extension    Hip abduction (seated) 4 4  Hip adduction (setaed) 4 4  Hip internal rotation    Hip external rotation    Knee flexion (seated) 5 5  Knee extension 5 5  Ankle dorsiflexion 5 5  Ankle plantarflexion    (* = pain; Blank rows = not tested)  UE strength testing WNL and symmetrical  Transfers: Assistive device utilized: None  Sit to stand: Complete Independence Stand to sit: Complete Independence Chair to chair: Complete Independence Floor:  Not performed  Curb:  Deferred  Stairs: Level of Assistance: Modified independence Stair Negotiation Technique: Step to Pattern with Single Rail on Right Number of Stairs: 4  Height of Stairs: 6"  Comments: Reciprocal ascending and step-to  descending  Gait: Gait pattern: decreased step length- Right and decreased step length- Left Distance walked: 100' Assistive device utilized: None Level of assistance: Complete Independence Comments: Decreased self-selected speed  Clinical Test of Sensory Interaction for Balance (CTSIB): CONDITION TIME  Eyes open, firm surface 30 seconds  Eyes closed, firm surface 7 seconds  Eyes open, foam surface 30 seconds  Eyes closed, foam surface 1.8 seconds    Functional Outcome Measures  Results Comments  BERG 51/56 Mild balance deficits  FGA 17/30   TUG 8.3 seconds WNL  5TSTS 11.0 seconds WNL  10 Meter Gait Speed Self-selected: 16.8s = 0.60 m/s; Fastest: 8.5s = 1.18 m/s Decreased self selected gait speed  MiniBESTest 17/28    (Blank rows = not tested)     TODAY'S TREATMENT     SUBJECTIVE STATEMENT:   Patient reports doing better around her house with keeping chin up/posture during walking. Patient reports doing okay with her HEP; she only missed today since she was coming to PT.     Therapeutic Exercise - improved  strength as needed to improve performance of CKC activities/functional movements and as needed for power production to prevent fall during episode of large postural perturbation   NuStep; Level 2; x 5 minutes - for improved soft tissue mobility and increased tissue temperature to improve muscle performance   -subjective gathered during this time   PATIENT EDUCATION: Discussed with patient expected progression of PT and HEP with successive weeks.   *not today* 3-way kick; 1x8 with bilat LE Minisquat, in // bars; 1x10  -for HEP review   Neuromuscular Re-education - for improved sensory integration, static and dynamic postural control, equilibrium and non-equilibrium coordination as needed for negotiating home and community environment and stepping over obstacles   In // bars: dynamic march; 5x D/B  -PT SBA  Forward step-over (then return to bilateral  standing with target in front), 5-lb ankle weight; 1x10 with each LE  Consecutive forward step-over; (4) 4x1 boards; in // bars; 4x D/B  Alternating lateral step-over, 6-inch hurdle; 2x10 alternating R/L  Gait in 70-ft hallway with ball pass for dual tasking; 4x D/B   Standing feet together, eyes closed; 1 x 30 sec Standing on Airex; feet together; x 30 seconds Standing on Airex; eyes closed; x 30 seconds  Gait down 70-ft hallway with scanning for visual targets on wall; identifying letters; x2 lengths of hall  *Next visit* Toe tapping   PATIENT EDUCATION:  Education details: see above for patient education details Person educated: Patient Education method: Explanation Education comprehension: verbalized understanding   HOME EXERCISE PROGRAM:  Access Code: [email or web address] URL: [email or web address] Date: 12/21/2022 Prepared by: Consuela Mimes  Exercises - Forward Step Over with Counter Support  - 2 x daily - 7 x weekly - 2 sets - 10 reps - Standing 3-Way Kick  - 2 x daily - 7 x weekly - 2 sets - 10 reps - Mini Squat with Counter Support  - 2 x daily - 7 x weekly - 2 sets - 10 reps   ASSESSMENT:  CLINICAL IMPRESSION: Patient participates very well with PT. She exhibits significant postural sway with addition of uneven surface and eyes closed during postural control exercise; however, she is able to maintain 30 sec with all conditions attempted today. Patient is able to complete dual tasking with gait including ball tossing and scanning for visual targets with no LOB. Pt does intermittently have lateral stagger with stepping; we will continue working on gait stability and dynamic balance for this. Pt needs continued work on LE strengthening, postural control training, reactive and dynamic balance exercise. Pt will continue to benefit from skilled PT services to address deficits and improve function.   OBJECTIVE IMPAIRMENTS: decreased balance, decreased cognition, and  decreased strength.   ACTIVITY LIMITATIONS: squatting, stairs, and transfers  PARTICIPATION LIMITATIONS: cleaning, laundry, shopping, and community activity  PERSONAL FACTORS: Age, Past/current experiences, Time since onset of injury/illness/exacerbation, and 3+ comorbidities: CVA, visual deficits, cognitive deficits, hearing impairment, DM, and CKD  are also affecting patient's functional outcome.   REHAB POTENTIAL: Fair    CLINICAL DECISION MAKING: Unstable/unpredictable  EVALUATION COMPLEXITY: High   GOALS: Goals reviewed with patient? No  SHORT TERM GOALS: Target date: 01/30/2023  Pt will be independent with HEP in order to improve strength and balance in order to decrease fall risk and improve function at home. Baseline:  Goal status: INITIAL   LONG TERM GOALS: Target date: 03/13/2023  Pt will increase FOTO to at least 60 to demonstrate significant improvement in function at home related to balance  Baseline: 12/19/22: 49/60 Goal status: INITIAL  2.  Pt will improve BERG by at least 3 points in order to demonstrate clinically significant improvement in balance.   Baseline: 12/19/22: 51/56 Goal status: INITIAL  3.  Pt will improve mini-BESTest by at least 4 points in order to demonstrate clinically significant improvement in balance and decreased risk for falls       Baseline: 12/19/22: To be completed next visit.    12/21/22: 17/28 Goal status: INITIAL  4. Pt will increase self-selected 77m gait speed to at least 0.80 m/s in order to demonstrate clinically significant improvement in community ambulation.   Baseline: 12/19/22: Self-selected: 16.8s = 0.60 m/s Goal status: INITIAL  5.  Pt will improve FGA by at least 5 points in order to demonstrate clinically significant improvement in balance and decreased risk for falls       Baseline: 12/26/22: 17/30 Goal status: INITIAL   PLAN: PT FREQUENCY: 2x/week  PT DURATION: 12 weeks  PLANNED INTERVENTIONS: Therapeutic  exercises, Therapeutic activity, Neuromuscular re-education, Balance training, Gait training, Patient/Family education, Self Care, Joint mobilization, Joint manipulation, Vestibular training, Canalith repositioning, Orthotic/Fit training, DME instructions, Dry Needling, Electrical stimulation, Spinal manipulation, Spinal mobilization, Cryotherapy, Moist heat, Taping, Traction, Ultrasound, Ionotophoresis 4mg /ml Dexamethasone, Manual therapy, and Re-evaluation.  PLAN FOR NEXT SESSION: Continue with postural control training, integrating eyes-closed tasks. Continue with LE strengthening and obstacle negotiation work; strategies for foot clearance and decreased shuffling.     Consuela Mimes, PT, DPT #[id number]  Gertie Exon 12/28/2022, 11:20 AM

## 2022-12-29 ENCOUNTER — Ambulatory Visit (INDEPENDENT_AMBULATORY_CARE_PROVIDER_SITE_OTHER): Payer: Medicare Other | Admitting: Psychiatry

## 2022-12-29 ENCOUNTER — Encounter: Payer: Self-pay | Admitting: Psychiatry

## 2022-12-29 VITALS — BP 107/64 | HR 108 | Temp 97.7°F | Ht 60.0 in | Wt 126.8 lb

## 2022-12-29 DIAGNOSIS — R4189 Other symptoms and signs involving cognitive functions and awareness: Secondary | ICD-10-CM

## 2022-12-29 DIAGNOSIS — R29818 Other symptoms and signs involving the nervous system: Secondary | ICD-10-CM

## 2022-12-29 DIAGNOSIS — F330 Major depressive disorder, recurrent, mild: Secondary | ICD-10-CM

## 2022-12-29 MED ORDER — BUPROPION HCL ER (XL) 150 MG PO TB24: 450.0000 mg | ORAL_TABLET | Freq: Every day | ORAL | 0 refills | Status: DC

## 2023-01-02 ENCOUNTER — Ambulatory Visit: Payer: Medicare Other | Admitting: Physical Therapy

## 2023-01-02 ENCOUNTER — Encounter: Payer: Self-pay | Admitting: Physical Therapy

## 2023-01-02 DIAGNOSIS — R2681 Unsteadiness on feet: Secondary | ICD-10-CM | POA: Diagnosis not present

## 2023-01-02 DIAGNOSIS — M6281 Muscle weakness (generalized): Secondary | ICD-10-CM

## 2023-01-02 NOTE — Therapy (Signed)
OUTPATIENT PHYSICAL THERAPY TREATMENT   Patient Name: Abigail Walker MRN: [id number] DOB:15-Apr-1945, 77 y.o., female Today's Date: 01/02/2023  END OF SESSION:  PT End of Session - 01/02/23 1122     Visit Number 5    Number of Visits 25    Date for PT Re-Evaluation 02/13/23    Authorization Type eval: 12/19/22    PT Start Time 1116    PT Stop Time 1205    PT Time Calculation (min) 49 min    Activity Tolerance Patient tolerated treatment well    Behavior During Therapy [facility] for tasks assessed/performed                Past Medical History:  Diagnosis Date   Adult hypothyroidism 06/16/2013   Anemia    Aortic atherosclerosis (HCC)    Arthritis    Benign hypertension 06/16/2013   Carotid artery disease (HCC)    a.) carotid US 02/10/2020: 1-39% BICA; b.) carotid US 09/22/2021: 1-39% BICA   [name] syndrome    CKD (chronic kidney disease), stage III (HCC)    Coronary artery calcification seen on CT scan    Depression    Esophageal spasm    Esophageal stricture    GERD (gastroesophageal reflux disease)    HBV (hepatitis B virus) infection    High blood pressure    HLD (hyperlipidemia)    Hypothyroidism    LBBB (left bundle branch block)    Lichen planus    Long term current use of antithrombotics/antiplatelets    a.) clopidogrel   Mixed incontinence    OSA on CPAP 06/16/2013   Pneumonia 2023   RLS (restless legs syndrome)    Secondary hyperparathyroidism of renal origin (HCC)    Skin cancer, basal cell    Sleep apnea    Stroke (HCC)    Thyroid disease    Type 2 diabetes mellitus treated with insulin (HCC)    Past Surgical History:  Procedure Laterality Date   CATARACT EXTRACTION Bilateral    COLONOSCOPY  2009   COLONOSCOPY WITH PROPOFOL N/A 11/19/2021   Procedure: COLONOSCOPY WITH PROPOFOL;  Surgeon: Regis Bill, MD;  Location: [facility] ENDOSCOPY;  Service: Endoscopy;  Laterality: N/A;  DM   DILATATION & CURETTAGE/HYSTEROSCOPY WITH MYOSURE   10/14/2021   Procedure: MYOSURE POLYPECTOMY;  Surgeon: Christeen Douglas, MD;  Location: [facility] ORS;  Service: Gynecology;;   ESOPHAGOGASTRODUODENOSCOPY     2009, 2011   ESOPHAGOGASTRODUODENOSCOPY (EGD) WITH PROPOFOL N/A 06/08/2015   Procedure: ESOPHAGOGASTRODUODENOSCOPY (EGD) WITH PROPOFOL;  Surgeon: Wallace Cullens, MD;  Location: [facility] ENDOSCOPY;  Service: Gastroenterology;  Laterality: N/A;   ESOPHAGOGASTRODUODENOSCOPY (EGD) WITH PROPOFOL N/A 11/19/2021   Procedure: ESOPHAGOGASTRODUODENOSCOPY (EGD) WITH PROPOFOL;  Surgeon: Regis Bill, MD;  Location: [facility] ENDOSCOPY;  Service: Endoscopy;  Laterality: N/A;   HEMORRHOID SURGERY     HYSTEROSCOPY WITH D & C N/A 10/14/2021   Procedure: DILATATION AND CURETTAGE /HYSTEROSCOPY;  Surgeon: Christeen Douglas, MD;  Location: [facility] ORS;  Service: Gynecology;  Laterality: N/A;   IMPLANTABLE CONTACT LENS IMPLANTATION     LYSIS OF ADHESION  10/14/2021   Procedure: LYSIS OF VAGINAL ADHESIONS;  Surgeon: Christeen Douglas, MD;  Location: [facility] ORS;  Service: Gynecology;;   TONSILLECTOMY     TUBAL LIGATION     Patient Active Problem List   Diagnosis Date Noted   [employer] Federation [employer] Gyne[employer] and Obstetric[employer] (FIGO) malignant neoplasm of endometrium stage IB (HCC) 01/19/2022   Status post surgery 10/14/2021   Aspiration pneumonia (HCC) 08/11/2021  CAP (community acquired pneumonia) 08/10/2021   Chest pain 08/10/2021   Dysphagia 08/10/2021   Type 2 diabetes mellitus with peripheral neuropathy (HCC) 08/10/2021   PAD (peripheral artery disease) (HCC) 02/22/2020   Hypoglycemia due to type 2 diabetes mellitus (HCC)    Hypothermia    Dehydration    Hypoglycemia 01/09/2020   Moderate protein-calorie malnutrition (HCC) 01/08/2020   Fall    Hyponatremia    Hyperglycemia 12/05/2019   Acute lower UTI 12/05/2019   Depression    Lumbar compression fracture (HCC)    Weakness of left foot 09/14/2019   Hyperlipidemia 08/13/2019   Hypercalcemia 05/01/2019    Leukopenia 01/17/2019   Stage 3 chronic kidney disease (HCC) 01/17/2019   Benign hypertensive kidney disease with chronic kidney disease 01/16/2019   Hyposmolality and/or hyponatremia 01/16/2019   Secondary hyperparathyroidism of renal origin (HCC) 01/16/2019   Carotid artery disease (HCC) 08/10/2017   Chronic midline low back pain without sciatica 03/16/2017   Primary osteoarthritis of both hands 01/16/2017   Benign essential tremor 08/12/2015   MGUS (monoclonal gammopathy of unknown significance) 07/08/2015   Mixed incontinence 11/09/2014   Atrophic vaginitis 07/28/2014   Encounter for general adult medical examination without abnormal findings 05/28/2014   Major depression in remission (HCC) 11/27/2013   Morbid obesity (HCC) 11/16/2013   Microalbuminuria 08/06/2013   Breath shortness 08/02/2013   Acid reflux 06/16/2013   Benign hypertension 06/16/2013   Type 2 diabetes mellitus (HCC) 06/16/2013   Hypothyroidism 06/16/2013   Obstructive apnea 06/16/2013   Hyperlipidemia associated with type 2 diabetes mellitus (HCC) 62/95/2841   Oral lichen planus 05/10/2011   Lichen planus 11/09/2010    PCP: Lauro Regulus, MD  REFERRING PROVIDER: Janice Coffin, PA-C   REFERRING DIAG: R29.6 (ICD-10-CM) - Repeated falls   RATIONALE FOR EVALUATION AND TREATMENT: Rehabilitation  THERAPY DIAG: Unsteadiness on feet  Muscle weakness (generalized)  ONSET DATE: Approximately 2 years  FOLLOW-UP APPT SCHEDULED WITH REFERRING PROVIDER: Yes   PERTINENT HISTORY:  Pt referred by neurology for issues with imbalance and falls. She states that she has been having difficulty with her balance for approximately 2 years. She reports dragging her feet occasionally when she walks. She has had 3 falls in the last 6 months but denies any significant injuries except for skin lacerations. She has a history of small infarcts involving right occipital lobe with left homonymous hemianopsia. Pt actually  reports she is lacking midline vision bilaterally but per chart has poor insight into her deficits. No new neurologic symptoms recently. She has a medical history significant for diabetes mellitus with complications of stage III chronic kidney disease, depression, hypothyroidism, hypertension, cognitive changes, recurrent falls and GERD. Also has a history of endometrial adenocarcinoma, s/p hysterectomy and bilateral SPO, not on chemotherapy or radiation secondary to lichen planus.  MRI brain with and without contrast 01/02/2020: No acute intracranial abnormality. Moderate parenchymal volume loss and chronic microvascular ischemic white matter disease. No acute fracture or traumatic listhesis of the cervical spine. Multilevel degenerative changes of the cervical spine as described above. Cervical and intracranial atherosclerosis.   MRI brain 11/27/22 without results yet  CT Cervical Spine without contrast 12/05/2019: No acute fracture or traumatic listhesis of the cervical spine. Multilevel degenerative changes of the cervical spine as described above. Cervical and intracranial atherosclerosis. Emphysema (ICD10-J43.9).   CT thoracic Spine without contrast 12/05/2019: L1 compression deformity, better detailed dedicated lumbar imaging. Slightly sclerotic endplate change and central cupping in the inferior endplate T12 with a rounded lucency  and extension of nitrogenous gas from the disc space is favored to reflect an acute to subacute Schmorl's node formation rather than true compression deformity. No other acute thoracic spine fracture or vertebral body height loss. Multilevel degenerative changes of the thoracic spine without significant spinal canal stenosis or neural foraminal narrowing within the thoracic levels. 3 mm solid nodule in the anterosuperior segment right upper lobe.   Pain: Yes, "arthritis" pain in spine Numbness/Tingling: Yes, bilateral LE neuropathy, some bilateral hand tingling  Focal  Weakness: Yes, L sided weakness per subjective report Recent changes in overall health/medication: Yes, worsening kidney function. Recently started Jardiance.  Prior history of physical therapy for balance:  No Dominant hand: left, uses both hands Imaging: Yes, see above  PRECAUTIONS: Fall  WEIGHT BEARING RESTRICTIONS: No  FALLS: Has patient fallen in last 6 months? Yes. Number of falls 3 , Directional pattern for falls: Yes to the left  Living Environment Lives with: lives alone Lives in: House/apartment, double wide mobile home Stairs: single leg home, ramp on front to enter, 8 steps on back with bilateral rails that she can reach simulataneously Has following equipment at home: Single point cane, Walker - 2 wheeled, Environmental consultant - 4 wheeled, and Wheelchair (manual) Tub shower (no grab bars), shower seat  Prior level of function: Independent  Occupational demands: Retired  Presenter, broadcasting: Social research officer, government, watching sports  Patient Goals: Improve balance. "I need to learn how to slow down."   OBJECTIVE:   Patient Surveys  FOTO: 49, predicted improvement to 60 ABC: 77.5%   Posture: Forward head with upper thoracic posture   LE MMT: MMT (out of 5) Right  Left   Hip flexion 4 4  Hip extension    Hip abduction (seated) 4 4  Hip adduction (setaed) 4 4  Hip internal rotation    Hip external rotation    Knee flexion (seated) 5 5  Knee extension 5 5  Ankle dorsiflexion 5 5  Ankle plantarflexion    (* = pain; Blank rows = not tested)  UE strength testing WNL and symmetrical  Transfers: Assistive device utilized: None  Sit to stand: Complete Independence Stand to sit: Complete Independence Chair to chair: Complete Independence Floor:  Not performed  Curb:  Deferred  Stairs: Level of Assistance: Modified independence Stair Negotiation Technique: Step to Pattern with Single Rail on Right Number of Stairs: 4  Height of Stairs: 6"  Comments: Reciprocal ascending and step-to  descending  Gait: Gait pattern: decreased step length- Right and decreased step length- Left Distance walked: 100' Assistive device utilized: None Level of assistance: Complete Independence Comments: Decreased self-selected speed  Clinical Test of Sensory Interaction for Balance (CTSIB): CONDITION TIME  Eyes open, firm surface 30 seconds  Eyes closed, firm surface 7 seconds  Eyes open, foam surface 30 seconds  Eyes closed, foam surface 1.8 seconds    Functional Outcome Measures  Results Comments  BERG 51/56 Mild balance deficits  FGA 17/30   TUG 8.3 seconds WNL  5TSTS 11.0 seconds WNL  10 Meter Gait Speed Self-selected: 16.8s = 0.60 m/s; Fastest: 8.5s = 1.18 m/s Decreased self selected gait speed  MiniBESTest 17/28    (Blank rows = not tested)     TODAY'S TREATMENT     SUBJECTIVE STATEMENT:   Patient reports doing better with raising up from squat or seated position. She reports fleeting R lateral hip pain with hip abduction exercise. Pt reports not feeling notable hip pain this AM or during active warm-up.  Therapeutic Exercise - improved strength as needed to improve performance of CKC activities/functional movements and as needed for power production to prevent fall during episode of large postural perturbation   NuStep; Level 2; x 5 minutes - for improved soft tissue mobility and increased tissue temperature to improve muscle performance   -subjective gathered during this time   PATIENT EDUCATION: HEP update/review. Discussed modification to hip isotonics with exercise program if experiencing notable lateral hip discomfort.    *not today* 3-way kick; 1x8 with bilat LE Minisquat, in // bars; 1x10  -for HEP review   Neuromuscular Re-education - for improved sensory integration, static and dynamic postural control, equilibrium and non-equilibrium coordination as needed for negotiating home and community environment and stepping over obstacles   In // bars:  dynamic march; 5x D/B  -PT SBA  Toe tapping; 6-inch step, 1x10 alternating Toe tapping; 6-inch step + Airex on top for higher step, 1x10 alternating   Consecutive forward step-over; (3) 4x1 boards and long Airex pad (oriented horizontally); in // bars; 4x D/B  Alternating lateral step-over, 6-inch hurdle; 2x10 alternating R/L  Gait in 70-ft hallway with ball pass for dual tasking; 4x D/B    Standing on Airex; eyes closed; 2 x 30 seconds Standing on Airex; eyes closed and feet together; x 9 seconds   *not today* Forward step-over (then return to bilateral standing with target in front), 5-lb ankle weight; 1x10 with each LE Standing feet together, eyes closed; 1 x 30 sec Standing on Airex; feet together; x 30 seconds Gait down 70-ft hallway with scanning for visual targets on wall; identifying letters; x2 lengths of hall     PATIENT EDUCATION:  Education details: see above for patient education details Person educated: Patient Education method: Explanation Education comprehension: verbalized understanding   HOME EXERCISE PROGRAM:  Access Code: [email or web address] URL: [email or web address] Date: 01/02/2023 Prepared by: Consuela Mimes  Exercises - Forward Step Over with Counter Support  - 2 x daily - 7 x weekly - 2 sets - 10 reps - Standing 3-Way Kick  - 2 x daily - 7 x weekly - 2 sets - 10 reps - Mini Squat with Counter Support  - 2 x daily - 7 x weekly - 2 sets - 10 reps - Standing Forward Toe Taps on Box (BKA)  - 2 x daily - 7 x weekly - 2 sets - 10 reps   ASSESSMENT:  CLINICAL IMPRESSION: Patient demonstrates intermittent lateral staggering with marching drill and with abrupt reaching out during gait with ball toss. Pt is performing well with static postural control training work and is able to maintain eyes closed on Airex for 30 sec with normal BOS; this is limited with narrow BOS as expected. Her HEP was updated for toe tapping to work further on stable  alternating weight shift. Pt needs continued work on LE strengthening, postural control training, reactive and dynamic balance exercise. Pt will continue to benefit from skilled PT services to address deficits and improve function.   OBJECTIVE IMPAIRMENTS: decreased balance, decreased cognition, and decreased strength.   ACTIVITY LIMITATIONS: squatting, stairs, and transfers  PARTICIPATION LIMITATIONS: cleaning, laundry, shopping, and community activity  PERSONAL FACTORS: Age, Past/current experiences, Time since onset of injury/illness/exacerbation, and 3+ comorbidities: CVA, visual deficits, cognitive deficits, hearing impairment, DM, and CKD  are also affecting patient's functional outcome.   REHAB POTENTIAL: Fair    CLINICAL DECISION MAKING: Unstable/unpredictable  EVALUATION COMPLEXITY: High   GOALS: Goals reviewed with patient? No  SHORT TERM GOALS:  Target date: 01/30/2023  Pt will be independent with HEP in order to improve strength and balance in order to decrease fall risk and improve function at home. Baseline:  Goal status: INITIAL   LONG TERM GOALS: Target date: 03/13/2023  Pt will increase FOTO to at least 60 to demonstrate significant improvement in function at home related to balance  Baseline: 12/19/22: 49/60 Goal status: INITIAL  2.  Pt will improve BERG by at least 3 points in order to demonstrate clinically significant improvement in balance.   Baseline: 12/19/22: 51/56 Goal status: INITIAL  3.  Pt will improve mini-BESTest by at least 4 points in order to demonstrate clinically significant improvement in balance and decreased risk for falls       Baseline: 12/19/22: To be completed next visit.    12/21/22: 17/28 Goal status: INITIAL  4. Pt will increase self-selected 71m gait speed to at least 0.80 m/s in order to demonstrate clinically significant improvement in community ambulation.   Baseline: 12/19/22: Self-selected: 16.8s = 0.60 m/s Goal status:  INITIAL  5.  Pt will improve FGA by at least 5 points in order to demonstrate clinically significant improvement in balance and decreased risk for falls       Baseline: 12/26/22: 17/30 Goal status: INITIAL   PLAN: PT FREQUENCY: 2x/week  PT DURATION: 12 weeks  PLANNED INTERVENTIONS: Therapeutic exercises, Therapeutic activity, Neuromuscular re-education, Balance training, Gait training, Patient/Family education, Self Care, Joint mobilization, Joint manipulation, Vestibular training, Canalith repositioning, Orthotic/Fit training, DME instructions, Dry Needling, Electrical stimulation, Spinal manipulation, Spinal mobilization, Cryotherapy, Moist heat, Taping, Traction, Ultrasound, Ionotophoresis 4mg /ml Dexamethasone, Manual therapy, and Re-evaluation.  PLAN FOR NEXT SESSION: Continue with postural control training, integrating eyes-closed tasks. Continue with LE strengthening and obstacle negotiation work; strategies for foot clearance and decreased shuffling.     Consuela Mimes, PT, DPT #[id number]  Gertie Exon 01/02/2023, 12:05 PM

## 2023-01-04 ENCOUNTER — Ambulatory Visit: Payer: Medicare Other | Admitting: Physical Therapy

## 2023-01-04 DIAGNOSIS — M6281 Muscle weakness (generalized): Secondary | ICD-10-CM

## 2023-01-04 DIAGNOSIS — R2681 Unsteadiness on feet: Secondary | ICD-10-CM | POA: Diagnosis not present

## 2023-01-04 NOTE — Therapy (Signed)
OUTPATIENT PHYSICAL THERAPY TREATMENT   Patient Name: Abigail Walker MRN: [id number] DOB:10-03-1945, 77 y.o., female Today's Date: 01/04/2023  END OF SESSION:  PT End of Session - 01/04/23 1100     Visit Number 6    Number of Visits 25    Date for PT Re-Evaluation 02/13/23    Authorization Type eval: 12/19/22    PT Start Time 1111    PT Stop Time 1155    PT Time Calculation (min) 44 min    Activity Tolerance Patient tolerated treatment well    Behavior During Therapy [facility] for tasks assessed/performed               Past Medical History:  Diagnosis Date   Adult hypothyroidism 06/16/2013   Anemia    Aortic atherosclerosis (HCC)    Arthritis    Benign hypertension 06/16/2013   Carotid artery disease (HCC)    a.) carotid US 02/10/2020: 1-39% BICA; b.) carotid US 09/22/2021: 1-39% BICA   [name] syndrome    CKD (chronic kidney disease), stage III (HCC)    Coronary artery calcification seen on CT scan    Depression    Esophageal spasm    Esophageal stricture    GERD (gastroesophageal reflux disease)    HBV (hepatitis B virus) infection    High blood pressure    HLD (hyperlipidemia)    Hypothyroidism    LBBB (left bundle branch block)    Lichen planus    Long term current use of antithrombotics/antiplatelets    a.) clopidogrel   Mixed incontinence    OSA on CPAP 06/16/2013   Pneumonia 2023   RLS (restless legs syndrome)    Secondary hyperparathyroidism of renal origin (HCC)    Skin cancer, basal cell    Sleep apnea    Stroke (HCC)    Thyroid disease    Type 2 diabetes mellitus treated with insulin (HCC)    Past Surgical History:  Procedure Laterality Date   CATARACT EXTRACTION Bilateral    COLONOSCOPY  2009   COLONOSCOPY WITH PROPOFOL N/A 11/19/2021   Procedure: COLONOSCOPY WITH PROPOFOL;  Surgeon: Regis Bill, MD;  Location: [facility] ENDOSCOPY;  Service: Endoscopy;  Laterality: N/A;  DM   DILATATION & CURETTAGE/HYSTEROSCOPY WITH MYOSURE   10/14/2021   Procedure: MYOSURE POLYPECTOMY;  Surgeon: Christeen Douglas, MD;  Location: [facility] ORS;  Service: Gynecology;;   ESOPHAGOGASTRODUODENOSCOPY     2009, 2011   ESOPHAGOGASTRODUODENOSCOPY (EGD) WITH PROPOFOL N/A 06/08/2015   Procedure: ESOPHAGOGASTRODUODENOSCOPY (EGD) WITH PROPOFOL;  Surgeon: Wallace Cullens, MD;  Location: [facility] ENDOSCOPY;  Service: Gastroenterology;  Laterality: N/A;   ESOPHAGOGASTRODUODENOSCOPY (EGD) WITH PROPOFOL N/A 11/19/2021   Procedure: ESOPHAGOGASTRODUODENOSCOPY (EGD) WITH PROPOFOL;  Surgeon: Regis Bill, MD;  Location: [facility] ENDOSCOPY;  Service: Endoscopy;  Laterality: N/A;   HEMORRHOID SURGERY     HYSTEROSCOPY WITH D & C N/A 10/14/2021   Procedure: DILATATION AND CURETTAGE /HYSTEROSCOPY;  Surgeon: Christeen Douglas, MD;  Location: [facility] ORS;  Service: Gynecology;  Laterality: N/A;   IMPLANTABLE CONTACT LENS IMPLANTATION     LYSIS OF ADHESION  10/14/2021   Procedure: LYSIS OF VAGINAL ADHESIONS;  Surgeon: Christeen Douglas, MD;  Location: [facility] ORS;  Service: Gynecology;;   TONSILLECTOMY     TUBAL LIGATION     Patient Active Problem List   Diagnosis Date Noted   [employer] Federation [employer] Gyne[employer] and Obstetric[employer] (FIGO) malignant neoplasm of endometrium stage IB (HCC) 01/19/2022   Status post surgery 10/14/2021   Aspiration pneumonia (HCC) 08/11/2021  CAP (community acquired pneumonia) 08/10/2021   Chest pain 08/10/2021   Dysphagia 08/10/2021   Type 2 diabetes mellitus with peripheral neuropathy (HCC) 08/10/2021   PAD (peripheral artery disease) (HCC) 02/22/2020   Hypoglycemia due to type 2 diabetes mellitus (HCC)    Hypothermia    Dehydration    Hypoglycemia 01/09/2020   Moderate protein-calorie malnutrition (HCC) 01/08/2020   Fall    Hyponatremia    Hyperglycemia 12/05/2019   Acute lower UTI 12/05/2019   Depression    Lumbar compression fracture (HCC)    Weakness of left foot 09/14/2019   Hyperlipidemia 08/13/2019   Hypercalcemia 05/01/2019    Leukopenia 01/17/2019   Stage 3 chronic kidney disease (HCC) 01/17/2019   Benign hypertensive kidney disease with chronic kidney disease 01/16/2019   Hyposmolality and/or hyponatremia 01/16/2019   Secondary hyperparathyroidism of renal origin (HCC) 01/16/2019   Carotid artery disease (HCC) 08/10/2017   Chronic midline low back pain without sciatica 03/16/2017   Primary osteoarthritis of both hands 01/16/2017   Benign essential tremor 08/12/2015   MGUS (monoclonal gammopathy of unknown significance) 07/08/2015   Mixed incontinence 11/09/2014   Atrophic vaginitis 07/28/2014   Encounter for general adult medical examination without abnormal findings 05/28/2014   Major depression in remission (HCC) 11/27/2013   Morbid obesity (HCC) 11/16/2013   Microalbuminuria 08/06/2013   Breath shortness 08/02/2013   Acid reflux 06/16/2013   Benign hypertension 06/16/2013   Type 2 diabetes mellitus (HCC) 06/16/2013   Hypothyroidism 06/16/2013   Obstructive apnea 06/16/2013   Hyperlipidemia associated with type 2 diabetes mellitus (HCC) 19/14/7829   Oral lichen planus 05/10/2011   Lichen planus 11/09/2010    PCP: Lauro Regulus, MD  REFERRING PROVIDER: Janice Coffin, PA-C   REFERRING DIAG: R29.6 (ICD-10-CM) - Repeated falls   RATIONALE FOR EVALUATION AND TREATMENT: Rehabilitation  THERAPY DIAG: Unsteadiness on feet  Muscle weakness (generalized)  ONSET DATE: Approximately 2 years  FOLLOW-UP APPT SCHEDULED WITH REFERRING PROVIDER: Yes   PERTINENT HISTORY:  Pt referred by neurology for issues with imbalance and falls. She states that she has been having difficulty with her balance for approximately 2 years. She reports dragging her feet occasionally when she walks. She has had 3 falls in the last 6 months but denies any significant injuries except for skin lacerations. She has a history of small infarcts involving right occipital lobe with left homonymous hemianopsia. Pt actually  reports she is lacking midline vision bilaterally but per chart has poor insight into her deficits. No new neurologic symptoms recently. She has a medical history significant for diabetes mellitus with complications of stage III chronic kidney disease, depression, hypothyroidism, hypertension, cognitive changes, recurrent falls and GERD. Also has a history of endometrial adenocarcinoma, s/p hysterectomy and bilateral SPO, not on chemotherapy or radiation secondary to lichen planus.  MRI brain with and without contrast 01/02/2020: No acute intracranial abnormality. Moderate parenchymal volume loss and chronic microvascular ischemic white matter disease. No acute fracture or traumatic listhesis of the cervical spine. Multilevel degenerative changes of the cervical spine as described above. Cervical and intracranial atherosclerosis.   MRI brain 11/27/22 without results yet  CT Cervical Spine without contrast 12/05/2019: No acute fracture or traumatic listhesis of the cervical spine. Multilevel degenerative changes of the cervical spine as described above. Cervical and intracranial atherosclerosis. Emphysema (ICD10-J43.9).   CT thoracic Spine without contrast 12/05/2019: L1 compression deformity, better detailed dedicated lumbar imaging. Slightly sclerotic endplate change and central cupping in the inferior endplate T12 with a rounded lucency  and extension of nitrogenous gas from the disc space is favored to reflect an acute to subacute Schmorl's node formation rather than true compression deformity. No other acute thoracic spine fracture or vertebral body height loss. Multilevel degenerative changes of the thoracic spine without significant spinal canal stenosis or neural foraminal narrowing within the thoracic levels. 3 mm solid nodule in the anterosuperior segment right upper lobe.   Pain: Yes, "arthritis" pain in spine Numbness/Tingling: Yes, bilateral LE neuropathy, some bilateral hand tingling  Focal  Weakness: Yes, L sided weakness per subjective report Recent changes in overall health/medication: Yes, worsening kidney function. Recently started Jardiance.  Prior history of physical therapy for balance:  No Dominant hand: left, uses both hands Imaging: Yes, see above  PRECAUTIONS: Fall  WEIGHT BEARING RESTRICTIONS: No  FALLS: Has patient fallen in last 6 months? Yes. Number of falls 3 , Directional pattern for falls: Yes to the left  Living Environment Lives with: lives alone Lives in: House/apartment, double wide mobile home Stairs: single leg home, ramp on front to enter, 8 steps on back with bilateral rails that she can reach simulataneously Has following equipment at home: Single point cane, Walker - 2 wheeled, Environmental consultant - 4 wheeled, and Wheelchair (manual) Tub shower (no grab bars), shower seat  Prior level of function: Independent  Occupational demands: Retired  Presenter, broadcasting: Social research officer, government, watching sports  Patient Goals: Improve balance. "I need to learn how to slow down."   OBJECTIVE:   Patient Surveys  FOTO: 49, predicted improvement to 60 ABC: 77.5%   Posture: Forward head with upper thoracic posture   LE MMT: MMT (out of 5) Right  Left   Hip flexion 4 4  Hip extension    Hip abduction (seated) 4 4  Hip adduction (setaed) 4 4  Hip internal rotation    Hip external rotation    Knee flexion (seated) 5 5  Knee extension 5 5  Ankle dorsiflexion 5 5  Ankle plantarflexion    (* = pain; Blank rows = not tested)  UE strength testing WNL and symmetrical  Transfers: Assistive device utilized: None  Sit to stand: Complete Independence Stand to sit: Complete Independence Chair to chair: Complete Independence Floor:  Not performed  Curb:  Deferred  Stairs: Level of Assistance: Modified independence Stair Negotiation Technique: Step to Pattern with Single Rail on Right Number of Stairs: 4  Height of Stairs: 6"  Comments: Reciprocal ascending and step-to  descending  Gait: Gait pattern: decreased step length- Right and decreased step length- Left Distance walked: 100' Assistive device utilized: None Level of assistance: Complete Independence Comments: Decreased self-selected speed  Clinical Test of Sensory Interaction for Balance (CTSIB): CONDITION TIME  Eyes open, firm surface 30 seconds  Eyes closed, firm surface 7 seconds  Eyes open, foam surface 30 seconds  Eyes closed, foam surface 1.8 seconds    Functional Outcome Measures  Results Comments  BERG 51/56 Mild balance deficits  FGA 17/30   TUG 8.3 seconds WNL  5TSTS 11.0 seconds WNL  10 Meter Gait Speed Self-selected: 16.8s = 0.60 m/s; Fastest: 8.5s = 1.18 m/s Decreased self selected gait speed  MiniBESTest 17/28    (Blank rows = not tested)     TODAY'S TREATMENT     SUBJECTIVE STATEMENT:   Patient reports mild R iliolumbar/R hip pain intermittently - it is not bad today. She feels she tolerated last session well. She reports walking to mailbox with faster velocity and no notable LOB. She reports doing well with  ADLs around her home.     Therapeutic Exercise - improved strength as needed to improve performance of CKC activities/functional movements and as needed for power production to prevent fall during episode of large postural perturbation   NuStep; Level 2; x 5 minutes - for improved soft tissue mobility and increased tissue temperature to improve muscle performance   -subjective gathered during this time   PATIENT EDUCATION: HEP reviewed.    *not today* 3-way kick; 1x8 with bilat LE Minisquat, in // bars; 1x10  -for HEP review   Neuromuscular Re-education - for improved sensory integration, static and dynamic postural control, equilibrium and non-equilibrium coordination as needed for negotiating home and community environment and stepping over obstacles   In // bars: dynamic march; 5x D/B  -PT SBA  Toe tapping; 6-inch step + Airex on top for higher  step, 1x10 alternating  Consecutive forward step-over; (2) 4x1 boards and (2) 6-inch hurdles; in // bars; 5x D/B  Alternating lateral step-over, 6-inch hurdle; 2x10 alternating R/L  Gait in 70-ft hallway with ball pass for dual tasking; 4x D/B    Standing on Airex; eyes closed; 2 x 30 seconds Standing on Airex; eyes closed and feet together; x 9 seconds  Standing on Airex with perturbations, multi-directional; 90 sec  Multi-directional stepping on blue star (5 directions; anterior, anterolateral R/L, lateral R/L); x5 ea dir   *not today* Toe tapping; 6-inch step, 1x10 alternating Forward step-over (then return to bilateral standing with target in front), 5-lb ankle weight; 1x10 with each LE Standing feet together, eyes closed; 1 x 30 sec Standing on Airex; feet together; x 30 seconds Gait down 70-ft hallway with scanning for visual targets on wall; identifying letters; x2 lengths of hall     PATIENT EDUCATION:  Education details: see above for patient education details Person educated: Patient Education method: Explanation Education comprehension: verbalized understanding   HOME EXERCISE PROGRAM:  Access Code: [email or web address] URL: [email or web address] Date: 01/02/2023 Prepared by: Consuela Mimes  Exercises - Forward Step Over with Counter Support  - 2 x daily - 7 x weekly - 2 sets - 10 reps - Standing 3-Way Kick  - 2 x daily - 7 x weekly - 2 sets - 10 reps - Mini Squat with Counter Support  - 2 x daily - 7 x weekly - 2 sets - 10 reps - Standing Forward Toe Taps on Box (BKA)  - 2 x daily - 7 x weekly - 2 sets - 10 reps   ASSESSMENT:  CLINICAL IMPRESSION: Patient responds well with manual perturbations today with on significant LOB. She is notably challenge with standing on foam with narrow BOS and eyes closed.  Patient is making good progress with gait stability. She is most challenged with multi-directional stepping with return to starting position. Pt needs  continued work on LE strengthening, postural control training, reactive and dynamic balance exercise. Pt will continue to benefit from skilled PT services to address deficits and improve function.   OBJECTIVE IMPAIRMENTS: decreased balance, decreased cognition, and decreased strength.   ACTIVITY LIMITATIONS: squatting, stairs, and transfers  PARTICIPATION LIMITATIONS: cleaning, laundry, shopping, and community activity  PERSONAL FACTORS: Age, Past/current experiences, Time since onset of injury/illness/exacerbation, and 3+ comorbidities: CVA, visual deficits, cognitive deficits, hearing impairment, DM, and CKD  are also affecting patient's functional outcome.   REHAB POTENTIAL: Fair    CLINICAL DECISION MAKING: Unstable/unpredictable  EVALUATION COMPLEXITY: High   GOALS: Goals reviewed with patient? No  SHORT TERM GOALS: Target date: 01/30/2023  Pt will be independent with HEP in order to improve strength and balance in order to decrease fall risk and improve function at home. Baseline:  Goal status: INITIAL   LONG TERM GOALS: Target date: 03/13/2023  Pt will increase FOTO to at least 60 to demonstrate significant improvement in function at home related to balance  Baseline: 12/19/22: 49/60 Goal status: INITIAL  2.  Pt will improve BERG by at least 3 points in order to demonstrate clinically significant improvement in balance.   Baseline: 12/19/22: 51/56 Goal status: INITIAL  3.  Pt will improve mini-BESTest by at least 4 points in order to demonstrate clinically significant improvement in balance and decreased risk for falls       Baseline: 12/19/22: To be completed next visit.    12/21/22: 17/28 Goal status: INITIAL  4. Pt will increase self-selected 9m gait speed to at least 0.80 m/s in order to demonstrate clinically significant improvement in community ambulation.   Baseline: 12/19/22: Self-selected: 16.8s = 0.60 m/s Goal status: INITIAL  5.  Pt will improve FGA by  at least 5 points in order to demonstrate clinically significant improvement in balance and decreased risk for falls       Baseline: 12/26/22: 17/30 Goal status: INITIAL   PLAN: PT FREQUENCY: 2x/week  PT DURATION: 12 weeks  PLANNED INTERVENTIONS: Therapeutic exercises, Therapeutic activity, Neuromuscular re-education, Balance training, Gait training, Patient/Family education, Self Care, Joint mobilization, Joint manipulation, Vestibular training, Canalith repositioning, Orthotic/Fit training, DME instructions, Dry Needling, Electrical stimulation, Spinal manipulation, Spinal mobilization, Cryotherapy, Moist heat, Taping, Traction, Ultrasound, Ionotophoresis 4mg /ml Dexamethasone, Manual therapy, and Re-evaluation.  PLAN FOR NEXT SESSION: Continue with postural control training, integrating eyes-closed tasks. Continue with LE strengthening and obstacle negotiation work; strategies for foot clearance and decreased shuffling.     Consuela Mimes, PT, DPT #[id number]  Gertie Exon 01/04/2023, 11:56 AM

## 2023-01-09 ENCOUNTER — Ambulatory Visit: Payer: Medicare Other | Admitting: Physical Therapy

## 2023-01-09 DIAGNOSIS — R2681 Unsteadiness on feet: Secondary | ICD-10-CM | POA: Diagnosis not present

## 2023-01-09 DIAGNOSIS — M6281 Muscle weakness (generalized): Secondary | ICD-10-CM

## 2023-01-09 NOTE — Therapy (Unsigned)
OUTPATIENT PHYSICAL THERAPY TREATMENT   Patient Name: Abigail Walker MRN: [id number] DOB:1945-04-14, 77 y.o., female Today's Date: 01/09/2023  END OF SESSION:  PT End of Session - 01/09/23 1115     Visit Number 7    Number of Visits 25    Date for PT Re-Evaluation 02/13/23    Authorization Type eval: 12/19/22    PT Start Time 1115    PT Stop Time 1157    PT Time Calculation (min) 42 min    Activity Tolerance Patient tolerated treatment well    Behavior During Therapy [facility] for tasks assessed/performed               Past Medical History:  Diagnosis Date   Adult hypothyroidism 06/16/2013   Anemia    Aortic atherosclerosis (HCC)    Arthritis    Benign hypertension 06/16/2013   Carotid artery disease (HCC)    a.) carotid US 02/10/2020: 1-39% BICA; b.) carotid US 09/22/2021: 1-39% BICA   [name] syndrome    CKD (chronic kidney disease), stage III (HCC)    Coronary artery calcification seen on CT scan    Depression    Esophageal spasm    Esophageal stricture    GERD (gastroesophageal reflux disease)    HBV (hepatitis B virus) infection    High blood pressure    HLD (hyperlipidemia)    Hypothyroidism    LBBB (left bundle branch block)    Lichen planus    Long term current use of antithrombotics/antiplatelets    a.) clopidogrel   Mixed incontinence    OSA on CPAP 06/16/2013   Pneumonia 2023   RLS (restless legs syndrome)    Secondary hyperparathyroidism of renal origin (HCC)    Skin cancer, basal cell    Sleep apnea    Stroke (HCC)    Thyroid disease    Type 2 diabetes mellitus treated with insulin (HCC)    Past Surgical History:  Procedure Laterality Date   CATARACT EXTRACTION Bilateral    COLONOSCOPY  2009   COLONOSCOPY WITH PROPOFOL N/A 11/19/2021   Procedure: COLONOSCOPY WITH PROPOFOL;  Surgeon: Regis Bill, MD;  Location: [facility] ENDOSCOPY;  Service: Endoscopy;  Laterality: N/A;  DM   DILATATION & CURETTAGE/HYSTEROSCOPY WITH MYOSURE   10/14/2021   Procedure: MYOSURE POLYPECTOMY;  Surgeon: Christeen Douglas, MD;  Location: [facility] ORS;  Service: Gynecology;;   ESOPHAGOGASTRODUODENOSCOPY     2009, 2011   ESOPHAGOGASTRODUODENOSCOPY (EGD) WITH PROPOFOL N/A 06/08/2015   Procedure: ESOPHAGOGASTRODUODENOSCOPY (EGD) WITH PROPOFOL;  Surgeon: Wallace Cullens, MD;  Location: [facility] ENDOSCOPY;  Service: Gastroenterology;  Laterality: N/A;   ESOPHAGOGASTRODUODENOSCOPY (EGD) WITH PROPOFOL N/A 11/19/2021   Procedure: ESOPHAGOGASTRODUODENOSCOPY (EGD) WITH PROPOFOL;  Surgeon: Regis Bill, MD;  Location: [facility] ENDOSCOPY;  Service: Endoscopy;  Laterality: N/A;   HEMORRHOID SURGERY     HYSTEROSCOPY WITH D & C N/A 10/14/2021   Procedure: DILATATION AND CURETTAGE /HYSTEROSCOPY;  Surgeon: Christeen Douglas, MD;  Location: [facility] ORS;  Service: Gynecology;  Laterality: N/A;   IMPLANTABLE CONTACT LENS IMPLANTATION     LYSIS OF ADHESION  10/14/2021   Procedure: LYSIS OF VAGINAL ADHESIONS;  Surgeon: Christeen Douglas, MD;  Location: [facility] ORS;  Service: Gynecology;;   TONSILLECTOMY     TUBAL LIGATION     Patient Active Problem List   Diagnosis Date Noted   [employer]ne[employer] (FIGO) malignant neoplasm of endometrium stage IB (HCC) 01/19/2022   Status post surgery 10/14/2021   Aspiration pneumonia (HCC) 08/11/2021  CAP (community acquired pneumonia) 08/10/2021   Chest pain 08/10/2021   Dysphagia 08/10/2021   Type 2 diabetes mellitus with peripheral neuropathy (HCC) 08/10/2021   PAD (peripheral artery disease) (HCC) 02/22/2020   Hypoglycemia due to type 2 diabetes mellitus (HCC)    Hypothermia    Dehydration    Hypoglycemia 01/09/2020   Moderate protein-calorie malnutrition (HCC) 01/08/2020   Fall    Hyponatremia    Hyperglycemia 12/05/2019   Acute lower UTI 12/05/2019   Depression    Lumbar compression fracture (HCC)    Weakness of left foot 09/14/2019   Hyperlipidemia 08/13/2019   Hypercalcemia 05/01/2019    Leukopenia 01/17/2019   Stage 3 chronic kidney disease (HCC) 01/17/2019   Benign hypertensive kidney disease with chronic kidney disease 01/16/2019   Hyposmolality and/or hyponatremia 01/16/2019   Secondary hyperparathyroidism of renal origin (HCC) 01/16/2019   Carotid artery disease (HCC) 08/10/2017   Chronic midline low back pain without sciatica 03/16/2017   Primary osteoarthritis of both hands 01/16/2017   Benign essential tremor 08/12/2015   MGUS (monoclonal gammopathy of unknown significance) 07/08/2015   Mixed incontinence 11/09/2014   Atrophic vaginitis 07/28/2014   Encounter for general adult medical examination without abnormal findings 05/28/2014   Major depression in remission (HCC) 11/27/2013   Morbid obesity (HCC) 11/16/2013   Microalbuminuria 08/06/2013   Breath shortness 08/02/2013   Acid reflux 06/16/2013   Benign hypertension 06/16/2013   Type 2 diabetes mellitus (HCC) 06/16/2013   Hypothyroidism 06/16/2013   Obstructive apnea 06/16/2013   Hyperlipidemia associated with type 2 diabetes mellitus (HCC) 25/95/6387   Oral lichen planus 05/10/2011   Lichen planus 11/09/2010    PCP: Lauro Regulus, MD  REFERRING PROVIDER: Janice Coffin, PA-C   REFERRING DIAG: R29.6 (ICD-10-CM) - Repeated falls   RATIONALE FOR EVALUATION AND TREATMENT: Rehabilitation  THERAPY DIAG: Unsteadiness on feet  Muscle weakness (generalized)  ONSET DATE: Approximately 2 years  FOLLOW-UP APPT SCHEDULED WITH REFERRING PROVIDER: Yes   PERTINENT HISTORY:  Pt referred by neurology for issues with imbalance and falls. She states that she has been having difficulty with her balance for approximately 2 years. She reports dragging her feet occasionally when she walks. She has had 3 falls in the last 6 months but denies any significant injuries except for skin lacerations. She has a history of small infarcts involving right occipital lobe with left homonymous hemianopsia. Pt actually  reports she is lacking midline vision bilaterally but per chart has poor insight into her deficits. No new neurologic symptoms recently. She has a medical history significant for diabetes mellitus with complications of stage III chronic kidney disease, depression, hypothyroidism, hypertension, cognitive changes, recurrent falls and GERD. Also has a history of endometrial adenocarcinoma, s/p hysterectomy and bilateral SPO, not on chemotherapy or radiation secondary to lichen planus.  MRI brain with and without contrast 01/02/2020: No acute intracranial abnormality. Moderate parenchymal volume loss and chronic microvascular ischemic white matter disease. No acute fracture or traumatic listhesis of the cervical spine. Multilevel degenerative changes of the cervical spine as described above. Cervical and intracranial atherosclerosis.   MRI brain 11/27/22 without results yet  CT Cervical Spine without contrast 12/05/2019: No acute fracture or traumatic listhesis of the cervical spine. Multilevel degenerative changes of the cervical spine as described above. Cervical and intracranial atherosclerosis. Emphysema (ICD10-J43.9).   CT thoracic Spine without contrast 12/05/2019: L1 compression deformity, better detailed dedicated lumbar imaging. Slightly sclerotic endplate change and central cupping in the inferior endplate T12 with a rounded lucency  and extension of nitrogenous gas from the disc space is favored to reflect an acute to subacute Schmorl's node formation rather than true compression deformity. No other acute thoracic spine fracture or vertebral body height loss. Multilevel degenerative changes of the thoracic spine without significant spinal canal stenosis or neural foraminal narrowing within the thoracic levels. 3 mm solid nodule in the anterosuperior segment right upper lobe.   Pain: Yes, "arthritis" pain in spine Numbness/Tingling: Yes, bilateral LE neuropathy, some bilateral hand tingling  Focal  Weakness: Yes, L sided weakness per subjective report Recent changes in overall health/medication: Yes, worsening kidney function. Recently started Jardiance.  Prior history of physical therapy for balance:  No Dominant hand: left, uses both hands Imaging: Yes, see above  PRECAUTIONS: Fall  WEIGHT BEARING RESTRICTIONS: No  FALLS: Has patient fallen in last 6 months? Yes. Number of falls 3 , Directional pattern for falls: Yes to the left  Living Environment Lives with: lives alone Lives in: House/apartment, double wide mobile home Stairs: single leg home, ramp on front to enter, 8 steps on back with bilateral rails that she can reach simulataneously Has following equipment at home: Single point cane, Walker - 2 wheeled, Environmental consultant - 4 wheeled, and Wheelchair (manual) Tub shower (no grab bars), shower seat  Prior level of function: Independent  Occupational demands: Retired  Presenter, broadcasting: Social research officer, government, watching sports  Patient Goals: Improve balance. "I need to learn how to slow down."   OBJECTIVE:   Patient Surveys  FOTO: 49, predicted improvement to 60 ABC: 77.5%   Posture: Forward head with upper thoracic posture   LE MMT: MMT (out of 5) Right  Left   Hip flexion 4 4  Hip extension    Hip abduction (seated) 4 4  Hip adduction (setaed) 4 4  Hip internal rotation    Hip external rotation    Knee flexion (seated) 5 5  Knee extension 5 5  Ankle dorsiflexion 5 5  Ankle plantarflexion    (* = pain; Blank rows = not tested)  UE strength testing WNL and symmetrical  Transfers: Assistive device utilized: None  Sit to stand: Complete Independence Stand to sit: Complete Independence Chair to chair: Complete Independence Floor:  Not performed  Curb:  Deferred  Stairs: Level of Assistance: Modified independence Stair Negotiation Technique: Step to Pattern with Single Rail on Right Number of Stairs: 4  Height of Stairs: 6"  Comments: Reciprocal ascending and step-to  descending  Gait: Gait pattern: decreased step length- Right and decreased step length- Left Distance walked: 100' Assistive device utilized: None Level of assistance: Complete Independence Comments: Decreased self-selected speed  Clinical Test of Sensory Interaction for Balance (CTSIB): CONDITION TIME  Eyes open, firm surface 30 seconds  Eyes closed, firm surface 7 seconds  Eyes open, foam surface 30 seconds  Eyes closed, foam surface 1.8 seconds    Functional Outcome Measures  Results Comments  BERG 51/56 Mild balance deficits  FGA 17/30   TUG 8.3 seconds WNL  5TSTS 11.0 seconds WNL  10 Meter Gait Speed Self-selected: 16.8s = 0.60 m/s; Fastest: 8.5s = 1.18 m/s Decreased self selected gait speed  MiniBESTest 17/28    (Blank rows = not tested)     TODAY'S TREATMENT     SUBJECTIVE STATEMENT:   Patient reports doing well with sit to stand with no UE assist. Patient reports no recent near-falls. She had LOB with toe tapping exercise, but she was readily able to use counter to stop herself from having fall.  Therapeutic Exercise - improved strength as needed to improve performance of CKC activities/functional movements and as needed for power production to prevent fall during episode of large postural perturbation   NuStep; Level 3; x 6 minutes - for improved soft tissue mobility and increased tissue temperature to improve muscle performance   -subjective gathered during portion of this time. 2 minutes not billed.    PATIENT EDUCATION: HEP reviewed.    *not today* 3-way kick; 1x8 with bilat LE Minisquat, in // bars; 1x10  -for HEP review   Neuromuscular Re-education - for improved sensory integration, static and dynamic postural control, equilibrium and non-equilibrium coordination as needed for negotiating home and community environment and stepping over obstacles   In // bars: dynamic march; 5x D/B  -PT SBA  Toe tapping; 12-inch step; pt standing on Airex;  1x10 alternating  Consecutive forward step-over; (2) 6-inch hurdles, 2 Airex pads step up/down, and alternating toe taps with 5 cones on R and L; in hallway; 2x D/B   Gait in 1/2 of 70-ft hallway with ball pass for dual tasking; forward to retro stepping; 3x D/B  Standing on Airex; eyes closed; 2 x 30 seconds Standing on Airex; eyes closed and narrow BOS; multiple attempts with up to 20 sec-hold attained  Standing on Airex with perturbations, multi-directional; 90 sec  Multi-directional stepping on blue star (5 directions; anterior, anterolateral R/L, lateral R/L); x5 ea dir   *not today* Alternating lateral step-over, 6-inch hurdle; 2x10 alternating R/L Toe tapping; 6-inch step, 1x10 alternating Forward step-over (then return to bilateral standing with target in front), 5-lb ankle weight; 1x10 with each LE Standing feet together, eyes closed; 1 x 30 sec Standing on Airex; feet together; x 30 seconds Gait down 70-ft hallway with scanning for visual targets on wall; identifying letters; x2 lengths of hall     PATIENT EDUCATION:  Education details: see above for patient education details Person educated: Patient Education method: Explanation Education comprehension: verbalized understanding   HOME EXERCISE PROGRAM:  Access Code: [email or web address] URL: [email or web address] Date: 01/02/2023 Prepared by: Consuela Mimes  Exercises - Forward Step Over with Counter Support  - 2 x daily - 7 x weekly - 2 sets - 10 reps - Standing 3-Way Kick  - 2 x daily - 7 x weekly - 2 sets - 10 reps - Mini Squat with Counter Support  - 2 x daily - 7 x weekly - 2 sets - 10 reps - Standing Forward Toe Taps on Box (BKA)  - 2 x daily - 7 x weekly - 2 sets - 10 reps   ASSESSMENT:  CLINICAL IMPRESSION: Patient demonstrates improved ability to perform sensory integration with pt maintaining 30 sec on foam with eyes closed with wide BOS and up to 20 sec with feet narrow (3-inch gap, not  completely together). She exhibits sound obstacle negotiation, but she is notably challenged with alternating toe tapping tasks. Pt has made good progress to date, and she feels that she is doing better with home-level mobility tasks and transfers. Pt needs continued work on LE strengthening, postural control training, reactive and dynamic balance exercise. Pt will continue to benefit from skilled PT services to address deficits and improve function.   OBJECTIVE IMPAIRMENTS: decreased balance, decreased cognition, and decreased strength.   ACTIVITY LIMITATIONS: squatting, stairs, and transfers  PARTICIPATION LIMITATIONS: cleaning, laundry, shopping, and community activity  PERSONAL FACTORS: Age, Past/current experiences, Time since onset of injury/illness/exacerbation, and 3+ comorbidities: CVA, visual deficits, cognitive deficits, hearing impairment, DM, and  CKD  are also affecting patient's functional outcome.   REHAB POTENTIAL: Fair    CLINICAL DECISION MAKING: Unstable/unpredictable  EVALUATION COMPLEXITY: High   GOALS: Goals reviewed with patient? No  SHORT TERM GOALS: Target date: 01/30/2023  Pt will be independent with HEP in order to improve strength and balance in order to decrease fall risk and improve function at home. Baseline:  Goal status: INITIAL   LONG TERM GOALS: Target date: 03/13/2023  Pt will increase FOTO to at least 60 to demonstrate significant improvement in function at home related to balance  Baseline: 12/19/22: 49/60 Goal status: INITIAL  2.  Pt will improve BERG by at least 3 points in order to demonstrate clinically significant improvement in balance.   Baseline: 12/19/22: 51/56 Goal status: INITIAL  3.  Pt will improve mini-BESTest by at least 4 points in order to demonstrate clinically significant improvement in balance and decreased risk for falls       Baseline: 12/19/22: To be completed next visit.    12/21/22: 17/28 Goal status: INITIAL  4.  Pt will increase self-selected 73m gait speed to at least 0.80 m/s in order to demonstrate clinically significant improvement in community ambulation.   Baseline: 12/19/22: Self-selected: 16.8s = 0.60 m/s Goal status: INITIAL  5.  Pt will improve FGA by at least 5 points in order to demonstrate clinically significant improvement in balance and decreased risk for falls       Baseline: 12/26/22: 17/30 Goal status: INITIAL   PLAN: PT FREQUENCY: 2x/week  PT DURATION: 12 weeks  PLANNED INTERVENTIONS: Therapeutic exercises, Therapeutic activity, Neuromuscular re-education, Balance training, Gait training, Patient/Family education, Self Care, Joint mobilization, Joint manipulation, Vestibular training, Canalith repositioning, Orthotic/Fit training, DME instructions, Dry Needling, Electrical stimulation, Spinal manipulation, Spinal mobilization, Cryotherapy, Moist heat, Taping, Traction, Ultrasound, Ionotophoresis 4mg /ml Dexamethasone, Manual therapy, and Re-evaluation.  PLAN FOR NEXT SESSION: Continue with postural control training, integrating eyes-closed tasks. Continue with LE strengthening and obstacle negotiation work; strategies for foot clearance and decreased shuffling.     Consuela Mimes, PT, DPT #[id number]  Abigail Walker 01/09/2023, 11:18 AM

## 2023-01-12 ENCOUNTER — Ambulatory Visit: Payer: Medicare Other | Admitting: Physical Therapy

## 2023-01-12 ENCOUNTER — Encounter: Payer: Self-pay | Admitting: Physical Therapy

## 2023-01-12 DIAGNOSIS — R2681 Unsteadiness on feet: Secondary | ICD-10-CM | POA: Diagnosis not present

## 2023-01-12 DIAGNOSIS — M6281 Muscle weakness (generalized): Secondary | ICD-10-CM

## 2023-01-12 NOTE — Therapy (Signed)
OUTPATIENT PHYSICAL THERAPY TREATMENT   Patient Name: Abigail Walker MRN: [id number] DOB:October 30, 1945, 77 y.o., female Today's Date: 01/12/2023  END OF SESSION:  PT End of Session - 01/12/23 1126     Visit Number 8    Number of Visits 25    Date for PT Re-Evaluation 02/13/23    Authorization Type eval: 12/19/22    PT Start Time 1122    PT Stop Time 1201    PT Time Calculation (min) 39 min    Activity Tolerance Patient tolerated treatment well    Behavior During Therapy [facility] for tasks assessed/performed                Past Medical History:  Diagnosis Date   Adult hypothyroidism 06/16/2013   Anemia    Aortic atherosclerosis (HCC)    Arthritis    Benign hypertension 06/16/2013   Carotid artery disease (HCC)    a.) carotid US 02/10/2020: 1-39% BICA; b.) carotid US 09/22/2021: 1-39% BICA   [name] syndrome    CKD (chronic kidney disease), stage III (HCC)    Coronary artery calcification seen on CT scan    Depression    Esophageal spasm    Esophageal stricture    GERD (gastroesophageal reflux disease)    HBV (hepatitis B virus) infection    High blood pressure    HLD (hyperlipidemia)    Hypothyroidism    LBBB (left bundle branch block)    Lichen planus    Long term current use of antithrombotics/antiplatelets    a.) clopidogrel   Mixed incontinence    OSA on CPAP 06/16/2013   Pneumonia 2023   RLS (restless legs syndrome)    Secondary hyperparathyroidism of renal origin (HCC)    Skin cancer, basal cell    Sleep apnea    Stroke (HCC)    Thyroid disease    Type 2 diabetes mellitus treated with insulin (HCC)    Past Surgical History:  Procedure Laterality Date   CATARACT EXTRACTION Bilateral    COLONOSCOPY  2009   COLONOSCOPY WITH PROPOFOL N/A 11/19/2021   Procedure: COLONOSCOPY WITH PROPOFOL;  Surgeon: Regis Bill, MD;  Location: [facility] ENDOSCOPY;  Service: Endoscopy;  Laterality: N/A;  DM   DILATATION & CURETTAGE/HYSTEROSCOPY WITH MYOSURE   10/14/2021   Procedure: MYOSURE POLYPECTOMY;  Surgeon: Christeen Douglas, MD;  Location: [facility] ORS;  Service: Gynecology;;   ESOPHAGOGASTRODUODENOSCOPY     2009, 2011   ESOPHAGOGASTRODUODENOSCOPY (EGD) WITH PROPOFOL N/A 06/08/2015   Procedure: ESOPHAGOGASTRODUODENOSCOPY (EGD) WITH PROPOFOL;  Surgeon: Wallace Cullens, MD;  Location: [facility] ENDOSCOPY;  Service: Gastroenterology;  Laterality: N/A;   ESOPHAGOGASTRODUODENOSCOPY (EGD) WITH PROPOFOL N/A 11/19/2021   Procedure: ESOPHAGOGASTRODUODENOSCOPY (EGD) WITH PROPOFOL;  Surgeon: Regis Bill, MD;  Location: [facility] ENDOSCOPY;  Service: Endoscopy;  Laterality: N/A;   HEMORRHOID SURGERY     HYSTEROSCOPY WITH D & C N/A 10/14/2021   Procedure: DILATATION AND CURETTAGE /HYSTEROSCOPY;  Surgeon: Christeen Douglas, MD;  Location: [facility] ORS;  Service: Gynecology;  Laterality: N/A;   IMPLANTABLE CONTACT LENS IMPLANTATION     LYSIS OF ADHESION  10/14/2021   Procedure: LYSIS OF VAGINAL ADHESIONS;  Surgeon: Christeen Douglas, MD;  Location: [facility] ORS;  Service: Gynecology;;   TONSILLECTOMY     TUBAL LIGATION     Patient Active Problem List   Diagnosis Date Noted   [employer] Gyne[employer] Ob[employer] (FIGO) malignant neoplasm of endometrium stage IB (HCC) 01/19/2022   Status post surgery 10/14/2021   Aspiration pneumonia (HCC) 08/11/2021  CAP (community acquired pneumonia) 08/10/2021   Chest pain 08/10/2021   Dysphagia 08/10/2021   Type 2 diabetes mellitus with peripheral neuropathy (HCC) 08/10/2021   PAD (peripheral artery disease) (HCC) 02/22/2020   Hypoglycemia due to type 2 diabetes mellitus (HCC)    Hypothermia    Dehydration    Hypoglycemia 01/09/2020   Moderate protein-calorie malnutrition (HCC) 01/08/2020   Fall    Hyponatremia    Hyperglycemia 12/05/2019   Acute lower UTI 12/05/2019   Depression    Lumbar compression fracture (HCC)    Weakness of left foot 09/14/2019   Hyperlipidemia 08/13/2019   Hypercalcemia 05/01/2019    Leukopenia 01/17/2019   Stage 3 chronic kidney disease (HCC) 01/17/2019   Benign hypertensive kidney disease with chronic kidney disease 01/16/2019   Hyposmolality and/or hyponatremia 01/16/2019   Secondary hyperparathyroidism of renal origin (HCC) 01/16/2019   Carotid artery disease (HCC) 08/10/2017   Chronic midline low back pain without sciatica 03/16/2017   Primary osteoarthritis of both hands 01/16/2017   Benign essential tremor 08/12/2015   MGUS (monoclonal gammopathy of unknown significance) 07/08/2015   Mixed incontinence 11/09/2014   Atrophic vaginitis 07/28/2014   Encounter for general adult medical examination without abnormal findings 05/28/2014   Major depression in remission (HCC) 11/27/2013   Morbid obesity (HCC) 11/16/2013   Microalbuminuria 08/06/2013   Breath shortness 08/02/2013   Acid reflux 06/16/2013   Benign hypertension 06/16/2013   Type 2 diabetes mellitus (HCC) 06/16/2013   Hypothyroidism 06/16/2013   Obstructive apnea 06/16/2013   Hyperlipidemia associated with type 2 diabetes mellitus (HCC) 40/98/1191   Oral lichen planus 05/10/2011   Lichen planus 11/09/2010    PCP: Lauro Regulus, MD  REFERRING PROVIDER: Janice Coffin, PA-C   REFERRING DIAG: R29.6 (ICD-10-CM) - Repeated falls   RATIONALE FOR EVALUATION AND TREATMENT: Rehabilitation  THERAPY DIAG: Unsteadiness on feet  Muscle weakness (generalized)  ONSET DATE: Approximately 2 years  FOLLOW-UP APPT SCHEDULED WITH REFERRING PROVIDER: Yes   PERTINENT HISTORY:  Pt referred by neurology for issues with imbalance and falls. She states that she has been having difficulty with her balance for approximately 2 years. She reports dragging her feet occasionally when she walks. She has had 3 falls in the last 6 months but denies any significant injuries except for skin lacerations. She has a history of small infarcts involving right occipital lobe with left homonymous hemianopsia. Pt actually  reports she is lacking midline vision bilaterally but per chart has poor insight into her deficits. No new neurologic symptoms recently. She has a medical history significant for diabetes mellitus with complications of stage III chronic kidney disease, depression, hypothyroidism, hypertension, cognitive changes, recurrent falls and GERD. Also has a history of endometrial adenocarcinoma, s/p hysterectomy and bilateral SPO, not on chemotherapy or radiation secondary to lichen planus.  MRI brain with and without contrast 01/02/2020: No acute intracranial abnormality. Moderate parenchymal volume loss and chronic microvascular ischemic white matter disease. No acute fracture or traumatic listhesis of the cervical spine. Multilevel degenerative changes of the cervical spine as described above. Cervical and intracranial atherosclerosis.   MRI brain 11/27/22 without results yet  CT Cervical Spine without contrast 12/05/2019: No acute fracture or traumatic listhesis of the cervical spine. Multilevel degenerative changes of the cervical spine as described above. Cervical and intracranial atherosclerosis. Emphysema (ICD10-J43.9).   CT thoracic Spine without contrast 12/05/2019: L1 compression deformity, better detailed dedicated lumbar imaging. Slightly sclerotic endplate change and central cupping in the inferior endplate T12 with a rounded lucency  and extension of nitrogenous gas from the disc space is favored to reflect an acute to subacute Schmorl's node formation rather than true compression deformity. No other acute thoracic spine fracture or vertebral body height loss. Multilevel degenerative changes of the thoracic spine without significant spinal canal stenosis or neural foraminal narrowing within the thoracic levels. 3 mm solid nodule in the anterosuperior segment right upper lobe.   Pain: Yes, "arthritis" pain in spine Numbness/Tingling: Yes, bilateral LE neuropathy, some bilateral hand tingling  Focal  Weakness: Yes, L sided weakness per subjective report Recent changes in overall health/medication: Yes, worsening kidney function. Recently started Jardiance.  Prior history of physical therapy for balance:  No Dominant hand: left, uses both hands Imaging: Yes, see above  PRECAUTIONS: Fall  WEIGHT BEARING RESTRICTIONS: No  FALLS: Has patient fallen in last 6 months? Yes. Number of falls 3 , Directional pattern for falls: Yes to the left  Living Environment Lives with: lives alone Lives in: House/apartment, double wide mobile home Stairs: single leg home, ramp on front to enter, 8 steps on back with bilateral rails that she can reach simulataneously Has following equipment at home: Single point cane, Walker - 2 wheeled, Environmental consultant - 4 wheeled, and Wheelchair (manual) Tub shower (no grab bars), shower seat  Prior level of function: Independent  Occupational demands: Retired  Presenter, broadcasting: Social research officer, government, watching sports  Patient Goals: Improve balance. "I need to learn how to slow down."   OBJECTIVE:   Patient Surveys  FOTO: 49, predicted improvement to 60 ABC: 77.5%   Posture: Forward head with upper thoracic posture   LE MMT: MMT (out of 5) Right  Left   Hip flexion 4 4  Hip extension    Hip abduction (seated) 4 4  Hip adduction (setaed) 4 4  Hip internal rotation    Hip external rotation    Knee flexion (seated) 5 5  Knee extension 5 5  Ankle dorsiflexion 5 5  Ankle plantarflexion    (* = pain; Blank rows = not tested)  UE strength testing WNL and symmetrical  Transfers: Assistive device utilized: None  Sit to stand: Complete Independence Stand to sit: Complete Independence Chair to chair: Complete Independence Floor:  Not performed  Curb:  Deferred  Stairs: Level of Assistance: Modified independence Stair Negotiation Technique: Step to Pattern with Single Rail on Right Number of Stairs: 4  Height of Stairs: 6"  Comments: Reciprocal ascending and step-to  descending  Gait: Gait pattern: decreased step length- Right and decreased step length- Left Distance walked: 100' Assistive device utilized: None Level of assistance: Complete Independence Comments: Decreased self-selected speed  Clinical Test of Sensory Interaction for Balance (CTSIB): CONDITION TIME  Eyes open, firm surface 30 seconds  Eyes closed, firm surface 7 seconds  Eyes open, foam surface 30 seconds  Eyes closed, foam surface 1.8 seconds    Functional Outcome Measures  Results Comments  BERG 51/56 Mild balance deficits  FGA 17/30   TUG 8.3 seconds WNL  5TSTS 11.0 seconds WNL  10 Meter Gait Speed Self-selected: 16.8s = 0.60 m/s; Fastest: 8.5s = 1.18 m/s Decreased self selected gait speed  MiniBESTest 17/28    (Blank rows = not tested)     TODAY'S TREATMENT     SUBJECTIVE STATEMENT:   Patient reports no new concerns this afternoon in regard to getting around her home. She reports one issue with decelerating when walking forward.     Therapeutic Exercise - improved strength as needed to improve performance of  CKC activities/functional movements and as needed for power production to prevent fall during episode of large postural perturbation   NuStep; Level 4; x 5 minutes - for improved soft tissue mobility and increased tissue temperature to improve muscle performance   -subjective gathered during portion of this time.   PATIENT EDUCATION: HEP reviewed.    *not today* 3-way kick; 1x8 with bilat LE Minisquat, in // bars; 1x10  -for HEP review   Neuromuscular Re-education - for improved sensory integration, static and dynamic postural control, equilibrium and non-equilibrium coordination as needed for negotiating home and community environment and stepping over obstacles   In // bars: dynamic march, 4-lb ankle weights; 5x D/B  -PT SBA  Toe tapping; 12-inch step; pt standing on Airex; 1x10 alternating  Consecutive forward step-over; (2) 6-inch hurdles,  2 Airex pads step up/down, and alternating toe taps with 5 cones on R and L; in hallway; 2x D/B   Gait in 1/2 of 70-ft hallway with ball pass for dual tasking; forward to retro stepping; 3x D/B  Gait with verbal cueing ("go," "slow," "stop and turn"); x 5 minutes  Multi-directional stepping on blue star (5 directions; anterior, anterolateral R/L, lateral R/L); x5 ea dir    *next visit* Standing on Airex; eyes closed; 2 x 30 seconds Standing on Airex; eyes closed and narrow BOS; multiple attempts with up to 20 sec-hold attained Standing on Airex with perturbations, multi-directional; 90 sec   *not today* Alternating lateral step-over, 6-inch hurdle; 2x10 alternating R/L Toe tapping; 6-inch step, 1x10 alternating Forward step-over (then return to bilateral standing with target in front), 5-lb ankle weight; 1x10 with each LE Standing feet together, eyes closed; 1 x 30 sec Standing on Airex; feet together; x 30 seconds Gait down 70-ft hallway with scanning for visual targets on wall; identifying letters; x2 lengths of hall     PATIENT EDUCATION:  Education details: see above for patient education details Person educated: Patient Education method: Explanation Education comprehension: verbalized understanding   HOME EXERCISE PROGRAM:  Access Code: [email or web address] URL: [email or web address] Date: 01/02/2023 Prepared by: Consuela Mimes  Exercises - Forward Step Over with Counter Support  - 2 x daily - 7 x weekly - 2 sets - 10 reps - Standing 3-Way Kick  - 2 x daily - 7 x weekly - 2 sets - 10 reps - Mini Squat with Counter Support  - 2 x daily - 7 x weekly - 2 sets - 10 reps - Standing Forward Toe Taps on Box (BKA)  - 2 x daily - 7 x weekly - 2 sets - 10 reps   ASSESSMENT:  CLINICAL IMPRESSION: Patient demonstrates safe acceleration and deceleration today. She exhibits safe obstacle negotiation with obstacle course activity today. Pt does have intermittent loss of  smooth forward gait path during dual tasking/ball toss with gait. We will need to resume static balance/postural control work next visit. Pt needs continued work on LE strengthening, postural control training, reactive and dynamic balance exercise. Pt will continue to benefit from skilled PT services to address deficits and improve function.   OBJECTIVE IMPAIRMENTS: decreased balance, decreased cognition, and decreased strength.   ACTIVITY LIMITATIONS: squatting, stairs, and transfers  PARTICIPATION LIMITATIONS: cleaning, laundry, shopping, and community activity  PERSONAL FACTORS: Age, Past/current experiences, Time since onset of injury/illness/exacerbation, and 3+ comorbidities: CVA, visual deficits, cognitive deficits, hearing impairment, DM, and CKD  are also affecting patient's functional outcome.   REHAB POTENTIAL: Fair    CLINICAL DECISION MAKING: Unstable/unpredictable  EVALUATION COMPLEXITY:  High   GOALS: Goals reviewed with patient? No  SHORT TERM GOALS: Target date: 01/30/2023  Pt will be independent with HEP in order to improve strength and balance in order to decrease fall risk and improve function at home. Baseline:  Goal status: INITIAL   LONG TERM GOALS: Target date: 03/13/2023  Pt will increase FOTO to at least 60 to demonstrate significant improvement in function at home related to balance  Baseline: 12/19/22: 49/60 Goal status: INITIAL  2.  Pt will improve BERG by at least 3 points in order to demonstrate clinically significant improvement in balance.   Baseline: 12/19/22: 51/56 Goal status: INITIAL  3.  Pt will improve mini-BESTest by at least 4 points in order to demonstrate clinically significant improvement in balance and decreased risk for falls       Baseline: 12/19/22: To be completed next visit.    12/21/22: 17/28 Goal status: INITIAL  4. Pt will increase self-selected 67m gait speed to at least 0.80 m/s in order to demonstrate clinically  significant improvement in community ambulation.   Baseline: 12/19/22: Self-selected: 16.8s = 0.60 m/s Goal status: INITIAL  5.  Pt will improve FGA by at least 5 points in order to demonstrate clinically significant improvement in balance and decreased risk for falls       Baseline: 12/26/22: 17/30 Goal status: INITIAL   PLAN: PT FREQUENCY: 2x/week  PT DURATION: 12 weeks  PLANNED INTERVENTIONS: Therapeutic exercises, Therapeutic activity, Neuromuscular re-education, Balance training, Gait training, Patient/Family education, Self Care, Joint mobilization, Joint manipulation, Vestibular training, Canalith repositioning, Orthotic/Fit training, DME instructions, Dry Needling, Electrical stimulation, Spinal manipulation, Spinal mobilization, Cryotherapy, Moist heat, Taping, Traction, Ultrasound, Ionotophoresis 4mg /ml Dexamethasone, Manual therapy, and Re-evaluation.  PLAN FOR NEXT SESSION: Continue with postural control training, integrating eyes-closed tasks. Continue with LE strengthening and obstacle negotiation work; strategies for foot clearance and decreased shuffling.     Consuela Mimes, PT, DPT #[id number]  Gertie Exon 01/12/2023, 11:26 AM

## 2023-01-16 ENCOUNTER — Ambulatory Visit: Payer: Medicare Other | Admitting: Physical Therapy

## 2023-01-16 ENCOUNTER — Encounter: Payer: Self-pay | Admitting: Physical Therapy

## 2023-01-16 DIAGNOSIS — R2681 Unsteadiness on feet: Secondary | ICD-10-CM

## 2023-01-16 DIAGNOSIS — M6281 Muscle weakness (generalized): Secondary | ICD-10-CM

## 2023-01-16 NOTE — Therapy (Signed)
OUTPATIENT PHYSICAL THERAPY TREATMENT   Patient Name: Abigail Walker MRN: [id number] DOB:04/11/1945, 77 y.o., female Today's Date: 01/16/2023  END OF SESSION:  PT End of Session - 01/16/23 1103     Visit Number 9    Number of Visits 25    Date for PT Re-Evaluation 02/13/23    Authorization Type eval: 12/19/22    PT Start Time 1109    PT Stop Time 1153    PT Time Calculation (min) 44 min    Activity Tolerance Patient tolerated treatment well    Behavior During Therapy [facility] for tasks assessed/performed                Past Medical History:  Diagnosis Date   Adult hypothyroidism 06/16/2013   Anemia    Aortic atherosclerosis (HCC)    Arthritis    Benign hypertension 06/16/2013   Carotid artery disease (HCC)    a.) carotid US 02/10/2020: 1-39% BICA; b.) carotid US 09/22/2021: 1-39% BICA   [name] syndrome    CKD (chronic kidney disease), stage III (HCC)    Coronary artery calcification seen on CT scan    Depression    Esophageal spasm    Esophageal stricture    GERD (gastroesophageal reflux disease)    HBV (hepatitis B virus) infection    High blood pressure    HLD (hyperlipidemia)    Hypothyroidism    LBBB (left bundle branch block)    Lichen planus    Long term current use of antithrombotics/antiplatelets    a.) clopidogrel   Mixed incontinence    OSA on CPAP 06/16/2013   Pneumonia 2023   RLS (restless legs syndrome)    Secondary hyperparathyroidism of renal origin (HCC)    Skin cancer, basal cell    Sleep apnea    Stroke (HCC)    Thyroid disease    Type 2 diabetes mellitus treated with insulin (HCC)    Past Surgical History:  Procedure Laterality Date   CATARACT EXTRACTION Bilateral    COLONOSCOPY  2009   COLONOSCOPY WITH PROPOFOL N/A 11/19/2021   Procedure: COLONOSCOPY WITH PROPOFOL;  Surgeon: Regis Bill, MD;  Location: [facility] ENDOSCOPY;  Service: Endoscopy;  Laterality: N/A;  DM   DILATATION & CURETTAGE/HYSTEROSCOPY WITH MYOSURE   10/14/2021   Procedure: MYOSURE POLYPECTOMY;  Surgeon: Christeen Douglas, MD;  Location: [facility] ORS;  Service: Gynecology;;   ESOPHAGOGASTRODUODENOSCOPY     2009, 2011   ESOPHAGOGASTRODUODENOSCOPY (EGD) WITH PROPOFOL N/A 06/08/2015   Procedure: ESOPHAGOGASTRODUODENOSCOPY (EGD) WITH PROPOFOL;  Surgeon: Wallace Cullens, MD;  Location: [facility] ENDOSCOPY;  Service: Gastroenterology;  Laterality: N/A;   ESOPHAGOGASTRODUODENOSCOPY (EGD) WITH PROPOFOL N/A 11/19/2021   Procedure: ESOPHAGOGASTRODUODENOSCOPY (EGD) WITH PROPOFOL;  Surgeon: Regis Bill, MD;  Location: [facility] ENDOSCOPY;  Service: Endoscopy;  Laterality: N/A;   HEMORRHOID SURGERY     HYSTEROSCOPY WITH D & C N/A 10/14/2021   Procedure: DILATATION AND CURETTAGE /HYSTEROSCOPY;  Surgeon: Christeen Douglas, MD;  Location: [facility] ORS;  Service: Gynecology;  Laterality: N/A;   IMPLANTABLE CONTACT LENS IMPLANTATION     LYSIS OF ADHESION  10/14/2021   Procedure: LYSIS OF VAGINAL ADHESIONS;  Surgeon: Christeen Douglas, MD;  Location: [facility] ORS;  Service: Gynecology;;   TONSILLECTOMY     TUBAL LIGATION     Patient Active Problem List   Diagnosis Date Noted   [employer] Federation [employer] Gyne[employer] and Obstetric[employer] (FIGO) malignant neoplasm of endometrium stage IB (HCC) 01/19/2022   Status post surgery 10/14/2021   Aspiration pneumonia (HCC) 08/11/2021  CAP (community acquired pneumonia) 08/10/2021   Chest pain 08/10/2021   Dysphagia 08/10/2021   Type 2 diabetes mellitus with peripheral neuropathy (HCC) 08/10/2021   PAD (peripheral artery disease) (HCC) 02/22/2020   Hypoglycemia due to type 2 diabetes mellitus (HCC)    Hypothermia    Dehydration    Hypoglycemia 01/09/2020   Moderate protein-calorie malnutrition (HCC) 01/08/2020   Fall    Hyponatremia    Hyperglycemia 12/05/2019   Acute lower UTI 12/05/2019   Depression    Lumbar compression fracture (HCC)    Weakness of left foot 09/14/2019   Hyperlipidemia 08/13/2019   Hypercalcemia 05/01/2019    Leukopenia 01/17/2019   Stage 3 chronic kidney disease (HCC) 01/17/2019   Benign hypertensive kidney disease with chronic kidney disease 01/16/2019   Hyposmolality and/or hyponatremia 01/16/2019   Secondary hyperparathyroidism of renal origin (HCC) 01/16/2019   Carotid artery disease (HCC) 08/10/2017   Chronic midline low back pain without sciatica 03/16/2017   Primary osteoarthritis of both hands 01/16/2017   Benign essential tremor 08/12/2015   MGUS (monoclonal gammopathy of unknown significance) 07/08/2015   Mixed incontinence 11/09/2014   Atrophic vaginitis 07/28/2014   Encounter for general adult medical examination without abnormal findings 05/28/2014   Major depression in remission (HCC) 11/27/2013   Morbid obesity (HCC) 11/16/2013   Microalbuminuria 08/06/2013   Breath shortness 08/02/2013   Acid reflux 06/16/2013   Benign hypertension 06/16/2013   Type 2 diabetes mellitus (HCC) 06/16/2013   Hypothyroidism 06/16/2013   Obstructive apnea 06/16/2013   Hyperlipidemia associated with type 2 diabetes mellitus (HCC) 95/62/1308   Oral lichen planus 05/10/2011   Lichen planus 11/09/2010    PCP: Lauro Regulus, MD  REFERRING PROVIDER: Janice Coffin, PA-C   REFERRING DIAG: R29.6 (ICD-10-CM) - Repeated falls   RATIONALE FOR EVALUATION AND TREATMENT: Rehabilitation  THERAPY DIAG: Unsteadiness on feet  Muscle weakness (generalized)  ONSET DATE: Approximately 2 years  FOLLOW-UP APPT SCHEDULED WITH REFERRING PROVIDER: Yes   PERTINENT HISTORY:  Pt referred by neurology for issues with imbalance and falls. She states that she has been having difficulty with her balance for approximately 2 years. She reports dragging her feet occasionally when she walks. She has had 3 falls in the last 6 months but denies any significant injuries except for skin lacerations. She has a history of small infarcts involving right occipital lobe with left homonymous hemianopsia. Pt actually  reports she is lacking midline vision bilaterally but per chart has poor insight into her deficits. No new neurologic symptoms recently. She has a medical history significant for diabetes mellitus with complications of stage III chronic kidney disease, depression, hypothyroidism, hypertension, cognitive changes, recurrent falls and GERD. Also has a history of endometrial adenocarcinoma, s/p hysterectomy and bilateral SPO, not on chemotherapy or radiation secondary to lichen planus.  MRI brain with and without contrast 01/02/2020: No acute intracranial abnormality. Moderate parenchymal volume loss and chronic microvascular ischemic white matter disease. No acute fracture or traumatic listhesis of the cervical spine. Multilevel degenerative changes of the cervical spine as described above. Cervical and intracranial atherosclerosis.   MRI brain 11/27/22 without results yet  CT Cervical Spine without contrast 12/05/2019: No acute fracture or traumatic listhesis of the cervical spine. Multilevel degenerative changes of the cervical spine as described above. Cervical and intracranial atherosclerosis. Emphysema (ICD10-J43.9).   CT thoracic Spine without contrast 12/05/2019: L1 compression deformity, better detailed dedicated lumbar imaging. Slightly sclerotic endplate change and central cupping in the inferior endplate T12 with a rounded lucency  and extension of nitrogenous gas from the disc space is favored to reflect an acute to subacute Schmorl's node formation rather than true compression deformity. No other acute thoracic spine fracture or vertebral body height loss. Multilevel degenerative changes of the thoracic spine without significant spinal canal stenosis or neural foraminal narrowing within the thoracic levels. 3 mm solid nodule in the anterosuperior segment right upper lobe.   Pain: Yes, "arthritis" pain in spine Numbness/Tingling: Yes, bilateral LE neuropathy, some bilateral hand tingling  Focal  Weakness: Yes, L sided weakness per subjective report Recent changes in overall health/medication: Yes, worsening kidney function. Recently started Jardiance.  Prior history of physical therapy for balance:  No Dominant hand: left, uses both hands Imaging: Yes, see above  PRECAUTIONS: Fall  WEIGHT BEARING RESTRICTIONS: No  FALLS: Has patient fallen in last 6 months? Yes. Number of falls 3 , Directional pattern for falls: Yes to the left  Living Environment Lives with: lives alone Lives in: House/apartment, double wide mobile home Stairs: single leg home, ramp on front to enter, 8 steps on back with bilateral rails that she can reach simulataneously Has following equipment at home: Single point cane, Walker - 2 wheeled, Environmental consultant - 4 wheeled, and Wheelchair (manual) Tub shower (no grab bars), shower seat  Prior level of function: Independent  Occupational demands: Retired  Presenter, broadcasting: Social research officer, government, watching sports  Patient Goals: Improve balance. "I need to learn how to slow down."   OBJECTIVE:   Patient Surveys  FOTO: 49, predicted improvement to 60 ABC: 77.5%   Posture: Forward head with upper thoracic posture   LE MMT: MMT (out of 5) Right  Left   Hip flexion 4 4  Hip extension    Hip abduction (seated) 4 4  Hip adduction (setaed) 4 4  Hip internal rotation    Hip external rotation    Knee flexion (seated) 5 5  Knee extension 5 5  Ankle dorsiflexion 5 5  Ankle plantarflexion    (* = pain; Blank rows = not tested)  UE strength testing WNL and symmetrical  Transfers: Assistive device utilized: None  Sit to stand: Complete Independence Stand to sit: Complete Independence Chair to chair: Complete Independence Floor:  Not performed  Curb:  Deferred  Stairs: Level of Assistance: Modified independence Stair Negotiation Technique: Step to Pattern with Single Rail on Right Number of Stairs: 4  Height of Stairs: 6"  Comments: Reciprocal ascending and step-to  descending  Gait: Gait pattern: decreased step length- Right and decreased step length- Left Distance walked: 100' Assistive device utilized: None Level of assistance: Complete Independence Comments: Decreased self-selected speed  Clinical Test of Sensory Interaction for Balance (CTSIB): CONDITION TIME  Eyes open, firm surface 30 seconds  Eyes closed, firm surface 7 seconds  Eyes open, foam surface 30 seconds  Eyes closed, foam surface 1.8 seconds    Functional Outcome Measures  Results Comments  BERG 51/56 Mild balance deficits  FGA 17/30   TUG 8.3 seconds WNL  5TSTS 11.0 seconds WNL  10 Meter Gait Speed Self-selected: 16.8s = 0.60 m/s; Fastest: 8.5s = 1.18 m/s Decreased self selected gait speed  MiniBESTest 17/28    (Blank rows = not tested)     TODAY'S TREATMENT     SUBJECTIVE STATEMENT:   Patient reports no recent near-falls. Patient reports one incident of slipping and catching herself right away. She reports that she usually has notable back pain with getting clothes out of washing machine. She reports completing her laundry without  significant back pain. She reports less frequency of losing her balance when completing chores/ADLs in her home.     Therapeutic Exercise - improved strength as needed to improve performance of CKC activities/functional movements and as needed for power production to prevent fall during episode of large postural perturbation   NuStep; Level 4; x 5 minutes - for improved soft tissue mobility and increased tissue temperature to improve muscle performance   -subjective gathered during portion of this time.   PATIENT EDUCATION: HEP reviewed.    *not today* 3-way kick; 1x8 with bilat LE Minisquat, in // bars; 1x10  -for HEP review   Neuromuscular Re-education - for improved sensory integration, static and dynamic postural control, equilibrium and non-equilibrium coordination as needed for negotiating home and community environment  and stepping over obstacles   In // bars: dynamic march, 4-lb ankle weights; 5x D/B  -PT SBA  Toe tapping; 12-inch step; pt standing on Airex; 1x10 alternating   Standing on Airex; eyes closed and narrow BOS; multiple attempts with up to 20 sec-hold attained Standing on Airex; semitandem; 1 x 30 seconds each position  Gait in 1/2 of 70-ft hallway with ball pass for dual tasking; forward to retro stepping; 3x D/B  Gait with verbal cueing ("go," "slow," "stop and turn"); x 5 minutes   Consecutive forward step-over; (3) 6-inch hurdles, 2 Airex pads step up/down, and alternating toe taps with 4 cones on R and L; in hallway; 2x D/B   *next visit* Standing on Airex with perturbations, multi-directional; 90 sec   *not today* Multi-directional stepping on blue star (5 directions; anterior, anterolateral R/L, lateral R/L); x5 ea dir Alternating lateral step-over, 6-inch hurdle; 2x10 alternating R/L Toe tapping; 6-inch step, 1x10 alternating Forward step-over (then return to bilateral standing with target in front), 5-lb ankle weight; 1x10 with each LE Standing feet together, eyes closed; 1 x 30 sec Standing on Airex; feet together; x 30 seconds Gait down 70-ft hallway with scanning for visual targets on wall; identifying letters; x2 lengths of hall     PATIENT EDUCATION:  Education details: see above for patient education details Person educated: Patient Education method: Explanation Education comprehension: verbalized understanding   HOME EXERCISE PROGRAM:  Access Code: [email or web address] URL: [email or web address] Date: 01/02/2023 Prepared by: Consuela Mimes  Exercises - Forward Step Over with Counter Support  - 2 x daily - 7 x weekly - 2 sets - 10 reps - Standing 3-Way Kick  - 2 x daily - 7 x weekly - 2 sets - 10 reps - Mini Squat with Counter Support  - 2 x daily - 7 x weekly - 2 sets - 10 reps - Standing Forward Toe Taps on Box (BKA)  - 2 x daily - 7 x weekly - 2  sets - 10 reps   ASSESSMENT:  CLINICAL IMPRESSION: Patient is notably challenged with postural control on uneven surface - we additionally worked on Engineer, manufacturing systems task to challenge sensory integration. Pt does not have significant LOB with activities performed today; only mild disruption to smooth forward gait path with dual tasking associated with reaching out while on the move. Pt will be due for goal update next visit. Pt needs continued work on LE strengthening, postural control training, reactive and dynamic balance exercise. Pt will continue to benefit from skilled PT services to address deficits and improve function.   OBJECTIVE IMPAIRMENTS: decreased balance, decreased cognition, and decreased strength.   ACTIVITY LIMITATIONS: squatting, stairs, and transfers  PARTICIPATION LIMITATIONS: cleaning, laundry, shopping,  and community activity  PERSONAL FACTORS: Age, Past/current experiences, Time since onset of injury/illness/exacerbation, and 3+ comorbidities: CVA, visual deficits, cognitive deficits, hearing impairment, DM, and CKD  are also affecting patient's functional outcome.   REHAB POTENTIAL: Fair    CLINICAL DECISION MAKING: Unstable/unpredictable  EVALUATION COMPLEXITY: High   GOALS: Goals reviewed with patient? No  SHORT TERM GOALS: Target date: 01/30/2023  Pt will be independent with HEP in order to improve strength and balance in order to decrease fall risk and improve function at home. Baseline:  Goal status: INITIAL   LONG TERM GOALS: Target date: 03/13/2023  Pt will increase FOTO to at least 60 to demonstrate significant improvement in function at home related to balance  Baseline: 12/19/22: 49/60 Goal status: INITIAL  2.  Pt will improve BERG by at least 3 points in order to demonstrate clinically significant improvement in balance.   Baseline: 12/19/22: 51/56 Goal status: INITIAL  3.  Pt will improve mini-BESTest by at least 4 points in order to  demonstrate clinically significant improvement in balance and decreased risk for falls       Baseline: 12/19/22: To be completed next visit.    12/21/22: 17/28 Goal status: INITIAL  4. Pt will increase self-selected 25m gait speed to at least 0.80 m/s in order to demonstrate clinically significant improvement in community ambulation.   Baseline: 12/19/22: Self-selected: 16.8s = 0.60 m/s Goal status: INITIAL  5.  Pt will improve FGA by at least 5 points in order to demonstrate clinically significant improvement in balance and decreased risk for falls       Baseline: 12/26/22: 17/30 Goal status: INITIAL   PLAN: PT FREQUENCY: 2x/week  PT DURATION: 12 weeks  PLANNED INTERVENTIONS: Therapeutic exercises, Therapeutic activity, Neuromuscular re-education, Balance training, Gait training, Patient/Family education, Self Care, Joint mobilization, Joint manipulation, Vestibular training, Canalith repositioning, Orthotic/Fit training, DME instructions, Dry Needling, Electrical stimulation, Spinal manipulation, Spinal mobilization, Cryotherapy, Moist heat, Taping, Traction, Ultrasound, Ionotophoresis 4mg /ml Dexamethasone, Manual therapy, and Re-evaluation.  PLAN FOR NEXT SESSION: Continue with postural control training, integrating eyes-closed tasks. Continue with LE strengthening and obstacle negotiation work; strategies for foot clearance and decreased shuffling.     Consuela Mimes, PT, DPT #[id number]  Gertie Exon 01/16/2023, 12:07 PM

## 2023-01-16 NOTE — Therapy (Signed)
 OUTPATIENT PHYSICAL THERAPY TREATMENT AND PROGRESS NOTE   Dates of reporting period  12/19/22   to   01/19/23    Patient Name: Abigail Walker MRN: [id number] DOB:[birth year], 77 y.o., female Today's Date: 01/19/2023  END OF SESSION:  PT End of Session - 01/19/23 1107     Visit Number 10    Number of Visits 25    Date for PT Re-Evaluation 02/13/23    Authorization Type eval: 12/19/22    PT Start Time 1114    PT Stop Time 1205    PT Time Calculation (min) 51 min    Activity Tolerance Patient tolerated treatment well    Behavior During Therapy [facility] for tasks assessed/performed                 Past Medical History:  Diagnosis Date   Adult hypothyroidism 06/16/2013   Anemia    Aortic atherosclerosis (HCC)    Arthritis    Benign hypertension 06/16/2013   Carotid artery disease (HCC)    a.) carotid US  02/10/2020: 1-39% BICA; b.) carotid US  09/22/2021: 1-39% BICA   Carlin Abrahams syndrome    CKD (chronic kidney disease), stage III (HCC)    Coronary artery calcification seen on CT scan    Depression    Esophageal spasm    Esophageal stricture    GERD (gastroesophageal reflux disease)    HBV (hepatitis B virus) infection    High blood pressure    HLD (hyperlipidemia)    Hypothyroidism    LBBB (left bundle branch block)    Lichen planus    Long term current use of antithrombotics/antiplatelets    a.) clopidogrel    Mixed incontinence    OSA on CPAP 06/16/2013   Pneumonia 2023   RLS (restless legs syndrome)    Secondary hyperparathyroidism of renal origin (HCC)    Skin cancer, basal cell    Sleep apnea    Stroke (HCC)    Thyroid  disease    Type 2 diabetes mellitus treated with insulin  (HCC)    Past Surgical History:  Procedure Laterality Date   CATARACT EXTRACTION Bilateral    COLONOSCOPY  2009   COLONOSCOPY WITH PROPOFOL  N/A 11/19/2021   Procedure: COLONOSCOPY WITH PROPOFOL ;  Surgeon: Maryruth Ole ONEIDA, MD;  Location: [facility] ENDOSCOPY;  Service: Endoscopy;   Laterality: N/A;  DM   DILATATION & CURETTAGE/HYSTEROSCOPY WITH MYOSURE  10/14/2021   Procedure: MYOSURE POLYPECTOMY;  Surgeon: Verdon Keen, MD;  Location: [facility] ORS;  Service: Gynecology;;   ESOPHAGOGASTRODUODENOSCOPY     2009, 2011   ESOPHAGOGASTRODUODENOSCOPY (EGD) WITH PROPOFOL  N/A 06/08/2015   Procedure: ESOPHAGOGASTRODUODENOSCOPY (EGD) WITH PROPOFOL ;  Surgeon: Deward CINDERELLA Piedmont, MD;  Location: [facility] ENDOSCOPY;  Service: Gastroenterology;  Laterality: N/A;   ESOPHAGOGASTRODUODENOSCOPY (EGD) WITH PROPOFOL  N/A 11/19/2021   Procedure: ESOPHAGOGASTRODUODENOSCOPY (EGD) WITH PROPOFOL ;  Surgeon: Maryruth Ole ONEIDA, MD;  Location: [facility] ENDOSCOPY;  Service: Endoscopy;  Laterality: N/A;   HEMORRHOID SURGERY     HYSTEROSCOPY WITH D & C N/A 10/14/2021   Procedure: DILATATION AND CURETTAGE /HYSTEROSCOPY;  Surgeon: Verdon Keen, MD;  Location: [facility] ORS;  Service: Gynecology;  Laterality: N/A;   IMPLANTABLE CONTACT LENS IMPLANTATION     LYSIS OF ADHESION  10/14/2021   Procedure: LYSIS OF VAGINAL ADHESIONS;  Surgeon: Verdon Keen, MD;  Location: [facility] ORS;  Service: Gynecology;;   TONSILLECTOMY     TUBAL LIGATION     Patient Active Problem List   Diagnosis Date Noted   [employer] Gyne[employer] and Ob[employer] (FIGO) malignant  neoplasm of endometrium stage IB (HCC) 01/19/2022   Status post surgery 10/14/2021   Aspiration pneumonia (HCC) 08/11/2021   CAP (community acquired pneumonia) 08/10/2021   Chest pain 08/10/2021   Dysphagia 08/10/2021   Type 2 diabetes mellitus with peripheral neuropathy (HCC) 08/10/2021   PAD (peripheral artery disease) (HCC) 02/22/2020   Hypoglycemia due to type 2 diabetes mellitus (HCC)    Hypothermia    Dehydration    Hypoglycemia 01/09/2020   Moderate protein-calorie malnutrition (HCC) 01/08/2020   Fall    Hyponatremia    Hyperglycemia 12/05/2019   Acute lower UTI 12/05/2019   Depression    Lumbar compression fracture (HCC)    Weakness of left foot  09/14/2019   Hyperlipidemia 08/13/2019   Hypercalcemia 05/01/2019   Leukopenia 01/17/2019   Stage 3 chronic kidney disease (HCC) 01/17/2019   Benign hypertensive kidney disease with chronic kidney disease 01/16/2019   Hyposmolality and/or hyponatremia 01/16/2019   Secondary hyperparathyroidism of renal origin (HCC) 01/16/2019   Carotid artery disease (HCC) 08/10/2017   Chronic midline low back pain without sciatica 03/16/2017   Primary osteoarthritis of both hands 01/16/2017   Benign essential tremor 08/12/2015   MGUS (monoclonal gammopathy of unknown significance) 07/08/2015   Mixed incontinence 11/09/2014   Atrophic vaginitis 07/28/2014   Encounter for general adult medical examination without abnormal findings 05/28/2014   Major depression in remission (HCC) 11/27/2013   Morbid obesity (HCC) 11/16/2013   Microalbuminuria 08/06/2013   Breath shortness 08/02/2013   Acid reflux 06/16/2013   Benign hypertension 06/16/2013   Type 2 diabetes mellitus (HCC) 06/16/2013   Hypothyroidism 06/16/2013   Obstructive apnea 06/16/2013   Hyperlipidemia associated with type 2 diabetes mellitus (HCC) 94/68/7984   Oral lichen planus 05/10/2011   Lichen planus 11/09/2010    PCP: Lenon Layman ORN, MD  REFERRING PROVIDER: Paich, Kaitlin, PA-C   REFERRING DIAG: R29.6 (ICD-10-CM) - Repeated falls   RATIONALE FOR EVALUATION AND TREATMENT: Rehabilitation  THERAPY DIAG: Unsteadiness on feet  Muscle weakness (generalized)  ONSET DATE: Approximately 2 years  FOLLOW-UP APPT SCHEDULED WITH REFERRING PROVIDER: Yes   PERTINENT HISTORY:  Pt referred by neurology for issues with imbalance and falls. She states that she has been having difficulty with her balance for approximately 2 years. She reports dragging her feet occasionally when she walks. She has had 3 falls in the last 6 months but denies any significant injuries except for skin lacerations. She has a history of small infarcts involving  right occipital lobe with left homonymous hemianopsia. Pt actually reports she is lacking midline vision bilaterally but per chart has poor insight into her deficits. No new neurologic symptoms recently. She has a medical history significant for diabetes mellitus with complications of stage III chronic kidney disease, depression, hypothyroidism, hypertension, cognitive changes, recurrent falls and GERD. Also has a history of endometrial adenocarcinoma, s/p hysterectomy and bilateral SPO, not on chemotherapy or radiation secondary to lichen planus.  MRI brain with and without contrast 01/02/2020: No acute intracranial abnormality. Moderate parenchymal volume loss and chronic microvascular ischemic white matter disease. No acute fracture or traumatic listhesis of the cervical spine. Multilevel degenerative changes of the cervical spine as described above. Cervical and intracranial atherosclerosis.   MRI brain 11/27/22 without results yet  CT Cervical Spine without contrast 12/05/2019: No acute fracture or traumatic listhesis of the cervical spine. Multilevel degenerative changes of the cervical spine as described above. Cervical and intracranial atherosclerosis. Emphysema (ICD10-J43.9).   CT thoracic Spine without contrast 12/05/2019: L1 compression deformity,  better detailed dedicated lumbar imaging. Slightly sclerotic endplate change and central cupping in the inferior endplate T12 with a rounded lucency and extension of nitrogenous gas from the disc space is favored to reflect an acute to subacute Schmorl's node formation rather than true compression deformity. No other acute thoracic spine fracture or vertebral body height loss. Multilevel degenerative changes of the thoracic spine without significant spinal canal stenosis or neural foraminal narrowing within the thoracic levels. 3 mm solid nodule in the anterosuperior segment right upper lobe.   Pain: Yes, arthritis pain in spine Numbness/Tingling:  Yes, bilateral LE neuropathy, some bilateral hand tingling  Focal Weakness: Yes, L sided weakness per subjective report Recent changes in overall health/medication: Yes, worsening kidney function. Recently started Jardiance.  Prior history of physical therapy for balance:  No Dominant hand: left, uses both hands Imaging: Yes, see above  PRECAUTIONS: Fall  WEIGHT BEARING RESTRICTIONS: No  FALLS: Has patient fallen in last 6 months? Yes. Number of falls 3 , Directional pattern for falls: Yes to the left  Living Environment Lives with: lives alone Lives in: House/apartment, double wide mobile home Stairs: single leg home, ramp on front to enter, 8 steps on back with bilateral rails that she can reach simulataneously Has following equipment at home: Single point cane, Walker - 2 wheeled, Environmental Consultant - 4 wheeled, and Wheelchair (manual) Tub shower (no grab bars), shower seat  Prior level of function: Independent  Occupational demands: Retired  Presenter, Broadcasting: Social Research Officer, Government, watching sports  Patient Goals: Improve balance. I need to learn how to slow down.     OBJECTIVE (data from initial evaluation unless otherwise dated):   Patient Surveys  FOTO: 29, predicted improvement to 60 ABC: 77.5%   Posture: Forward head with upper thoracic posture   LE MMT: MMT (out of 5) Right  Left   Hip flexion 4 4  Hip extension    Hip abduction (seated) 4 4  Hip adduction (setaed) 4 4  Hip internal rotation    Hip external rotation    Knee flexion (seated) 5 5  Knee extension 5 5  Ankle dorsiflexion 5 5  Ankle plantarflexion    (* = pain; Blank rows = not tested)  UE strength testing WNL and symmetrical  Transfers: Assistive device utilized: None  Sit to stand: Complete Independence Stand to sit: Complete Independence Chair to chair: Complete Independence Floor:  Not performed  Curb:  Deferred  Stairs: Level of Assistance: Modified independence Stair Negotiation Technique: Step to  Pattern with Single Rail on Right Number of Stairs: 4  Height of Stairs: 6  Comments: Reciprocal ascending and step-to descending  Gait: Gait pattern: decreased step length- Right and decreased step length- Left Distance walked: 100' Assistive device utilized: None Level of assistance: Complete Independence Comments: Decreased self-selected speed  Clinical Test of Sensory Interaction for Balance (CTSIB): CONDITION TIME  Eyes open, firm surface 30 seconds  Eyes closed, firm surface 7 seconds  Eyes open, foam surface 30 seconds  Eyes closed, foam surface 1.8 seconds    Functional Outcome Measures  Results Comments  BERG 51/56 Mild balance deficits  FGA 17/30   TUG 8.3 seconds WNL  5TSTS 11.0 seconds WNL  10 Meter Gait Speed Self-selected: 16.8s = 0.60 m/s; Fastest: 8.5s = 1.18 m/s Decreased self selected gait speed  MiniBESTest 17/28    (Blank rows = not tested)     TODAY'S TREATMENT     SUBJECTIVE STATEMENT:   Patient reports 100% global rating of improvement. She  reports feeling better compared to outset of PT. She reports improved awareness of heel-to-toe stepping and decreased toe dragging with gait. She reports one episode of stumbling in her home this past week with pt able to abruptly step and catch herself - no fall and no upper limb assist to prevent fall. Pt reports intermittently losing balance with abrupt change of direction during gait.     Therapeutic Exercise - improved strength as needed to improve performance of CKC activities/functional movements and as needed for power production to prevent fall during episode of large postural perturbation   *GOAL UPDATE PERFORMED  NuStep; Level 4; x 5 minutes - for improved soft tissue mobility and increased tissue temperature to improve muscle performance   -subjective gathered during portion of this time.   PATIENT EDUCATION: Discussed current PT progress, prognosis, POC.    *not today* 3-way kick; 1x8 with  bilat LE Minisquat, in // bars; 1x10  -for HEP review   Neuromuscular Re-education - for improved sensory integration, static and dynamic postural control, equilibrium and non-equilibrium coordination as needed for negotiating home and community environment and stepping over obstacles  *Performance of FGA, gait speed, and Mini-BESTest  -Complete BERG Balance Scale next visit   *next visit* Standing on Airex with perturbations, multi-directional; 90 sec In // bars: dynamic march, 4-lb ankle weights; 5x D/B  -PT SBA Toe tapping; 12-inch step; pt standing on Airex; 1x10 alternating Standing on Airex; eyes closed and narrow BOS; multiple attempts with up to 20 sec-hold attained Standing on Airex; semitandem; 1 x 30 seconds each position Consecutive forward step-over; (3) 6-inch hurdles, 2 Airex pads step up/down, and alternating toe taps with 4 cones on R and L; in hallway; 2x D/B   *not today* Gait in 1/2 of 70-ft hallway with ball pass for dual tasking; forward to retro stepping; 3x D/B Gait with verbal cueing (go, slow, stop and turn); x 5 minutes Multi-directional stepping on blue star (5 directions; anterior, anterolateral R/L, lateral R/L); x5 ea dir Alternating lateral step-over, 6-inch hurdle; 2x10 alternating R/L Toe tapping; 6-inch step, 1x10 alternating Forward step-over (then return to bilateral standing with target in front), 5-lb ankle weight; 1x10 with each LE Standing feet together, eyes closed; 1 x 30 sec Standing on Airex; feet together; x 30 seconds Gait down 70-ft hallway with scanning for visual targets on wall; identifying letters; x2 lengths of hall     PATIENT EDUCATION:  Education details: see above for patient education details Person educated: Patient Education method: Explanation Education comprehension: verbalized understanding   HOME EXERCISE PROGRAM:  Access Code: [email or web address] URL: [email or web address] Date:  01/02/2023 Prepared by: Venetia Endo  Exercises - Forward Step Over with Counter Support  - 2 x daily - 7 x weekly - 2 sets - 10 reps - Standing 3-Way Kick  - 2 x daily - 7 x weekly - 2 sets - 10 reps - Mini Squat with Counter Support  - 2 x daily - 7 x weekly - 2 sets - 10 reps - Standing Forward Toe Taps on Box (BKA)  - 2 x daily - 7 x weekly - 2 sets - 10 reps   ASSESSMENT:  CLINICAL IMPRESSION: Patient has fortunately met FOTO goal and gait speed goal. She has improved in total score for both Mini-BESTest and FGA. She is just one point shy of meeting MCID for both Mini-BEST and FGA. Pt is doing well with her HEP and is able to verbally recall home exercise parameters. She reports  notable improvement and benefit of PT to date. Pt reports increased awareness when negotiating her home and improved confidence with gait. She has not experienced recent fall. We were unable to complete BERG today with time required for other testing; we will need to assess BERG next visit. Pt needs continued work on LE strengthening, postural control training, reactive and dynamic balance exercise. Pt will continue to benefit from skilled PT services to address deficits and improve function.   OBJECTIVE IMPAIRMENTS: decreased balance, decreased cognition, and decreased strength.   ACTIVITY LIMITATIONS: squatting, stairs, and transfers  PARTICIPATION LIMITATIONS: cleaning, laundry, shopping, and community activity  PERSONAL FACTORS: Age, Past/current experiences, Time since onset of injury/illness/exacerbation, and 3+ comorbidities: CVA, visual deficits, cognitive deficits, hearing impairment, DM, and CKD  are also affecting patient's functional outcome.   REHAB POTENTIAL: Fair    CLINICAL DECISION MAKING: Unstable/unpredictable  EVALUATION COMPLEXITY: High   GOALS: Goals reviewed with patient? No  SHORT TERM GOALS: Target date: 01/30/2023  Pt will be independent with HEP in order to improve  strength and balance in order to decrease fall risk and improve function at home. Baseline:  12/21/22: Baseline HEP initiated.    01/19/23: Pt reports completing exercises usually 2x/day, first bout may be late AM.  Goal status: ACHIEVED   LONG TERM GOALS: Target date: 03/13/2023  Pt will increase FOTO to at least 60 to demonstrate significant improvement in function at home related to balance  Baseline: 12/19/22: 49/60  01/19/23: 67/60 Goal status: ACHIEVED  2.  Pt will improve BERG by at least 3 points in order to demonstrate clinically significant improvement in balance.   Baseline: 12/19/22: 51/56  01/19/23: NEXT VISIT Goal status: DEFERRED  3.  Pt will improve mini-BESTest by at least 4 points in order to demonstrate clinically significant improvement in balance and decreased risk for falls       Baseline: 12/19/22: To be completed next visit.    12/21/22: 17/28  01/19/23: 20/28 Goal status: IN PROGRESS  4. Pt will increase self-selected 69m gait speed to at least 0.80 m/s in order to demonstrate clinically significant improvement in community ambulation.   Baseline: 12/19/22: Self-selected: 16.8s = 0.60 m/s    01/19/23: Self-selected: 16.8s = 0.87 m/s Goal status: ACHIEVED  5.  Pt will improve FGA by at least 5 points in order to demonstrate clinically significant improvement in balance and decreased risk for falls       Baseline: 12/26/22: 17/30  01/19/23: 21/30 Goal status: IN PROGRESS    PLAN: PT FREQUENCY: 2x/week  PT DURATION: 4-6 weeks  PLANNED INTERVENTIONS: Therapeutic exercises, Therapeutic activity, Neuromuscular re-education, Balance training, Gait training, Patient/Family education, Self Care, Joint mobilization, Joint manipulation, Vestibular training, Canalith repositioning, Orthotic/Fit training, DME instructions, Dry Needling, Electrical stimulation, Spinal manipulation, Spinal mobilization, Cryotherapy, Moist heat, Taping, Traction, Ultrasound, Ionotophoresis  4mg /ml Dexamethasone , Manual therapy, and Re-evaluation.  PLAN FOR NEXT SESSION: Continue with postural control training, integrating eyes-closed tasks. Continue with LE strengthening and obstacle negotiation work; strategies for foot clearance and decreased shuffling.     Venetia Endo, PT, DPT #[id number]  Venetia ONEIDA Endo 01/19/2023, 12:45 PM

## 2023-01-19 ENCOUNTER — Ambulatory Visit: Payer: Medicare Other | Attending: Student | Admitting: Physical Therapy

## 2023-01-19 DIAGNOSIS — M6281 Muscle weakness (generalized): Secondary | ICD-10-CM | POA: Insufficient documentation

## 2023-01-19 DIAGNOSIS — R2681 Unsteadiness on feet: Secondary | ICD-10-CM | POA: Diagnosis present

## 2023-01-23 ENCOUNTER — Ambulatory Visit: Payer: Medicare Other | Admitting: Physical Therapy

## 2023-01-23 VITALS — BP 151/60 | HR 88

## 2023-01-23 DIAGNOSIS — R2681 Unsteadiness on feet: Secondary | ICD-10-CM

## 2023-01-23 DIAGNOSIS — M6281 Muscle weakness (generalized): Secondary | ICD-10-CM

## 2023-01-23 NOTE — Therapy (Signed)
 OUTPATIENT PHYSICAL THERAPY TREATMENT/UPDATE REMAINING GOAL   Patient Name: Abigail Walker MRN: [id number] DOB:11-Jun-1945, 78 y.o., female Today's Date: 01/23/2023  END OF SESSION:  PT End of Session - 01/23/23 1106     Visit Number 11    Number of Visits 25    Date for PT Re-Evaluation 02/13/23    Authorization Type eval: 12/19/22    PT Start Time 1109    PT Stop Time 1200    PT Time Calculation (min) 51 min    Activity Tolerance Patient tolerated treatment well    Behavior During Therapy [facility] for tasks assessed/performed             Past Medical History:  Diagnosis Date   Adult hypothyroidism 06/16/2013   Anemia    Aortic atherosclerosis (HCC)    Arthritis    Benign hypertension 06/16/2013   Carotid artery disease (HCC)    a.) carotid US  02/10/2020: 1-39% BICA; b.) carotid US  09/22/2021: 1-39% BICA   Carlin Abrahams syndrome    CKD (chronic kidney disease), stage III (HCC)    Coronary artery calcification seen on CT scan    Depression    Esophageal spasm    Esophageal stricture    GERD (gastroesophageal reflux disease)    HBV (hepatitis B virus) infection    High blood pressure    HLD (hyperlipidemia)    Hypothyroidism    LBBB (left bundle branch block)    Lichen planus    Long term current use of antithrombotics/antiplatelets    a.) clopidogrel    Mixed incontinence    OSA on CPAP 06/16/2013   Pneumonia 2023   RLS (restless legs syndrome)    Secondary hyperparathyroidism of renal origin (HCC)    Skin cancer, basal cell    Sleep apnea    Stroke (HCC)    Thyroid  disease    Type 2 diabetes mellitus treated with insulin  [facility])    Past Surgical History:  Procedure Laterality Date   CATARACT EXTRACTION Bilateral    COLONOSCOPY  2009   COLONOSCOPY WITH PROPOFOL  N/A 11/19/2021   Procedure: COLONOSCOPY WITH PROPOFOL ;  Surgeon: Maryruth Ole ONEIDA, MD;  Location: [facility] ENDOSCOPY;  Service: Endoscopy;  Laterality: N/A;  DM   DILATATION & CURETTAGE/HYSTEROSCOPY WITH  MYOSURE  10/14/2021   Procedure: MYOSURE POLYPECTOMY;  Surgeon: Verdon Keen, MD;  Location: [facility] ORS;  Service: Gynecology;;   ESOPHAGOGASTRODUODENOSCOPY     2009, 2011   ESOPHAGOGASTRODUODENOSCOPY (EGD) WITH PROPOFOL  N/A 06/08/2015   Procedure: ESOPHAGOGASTRODUODENOSCOPY (EGD) WITH PROPOFOL ;  Surgeon: Deward CINDERELLA Piedmont, MD;  Location: [facility] ENDOSCOPY;  Service: Gastroenterology;  Laterality: N/A;   ESOPHAGOGASTRODUODENOSCOPY (EGD) WITH PROPOFOL  N/A 11/19/2021   Procedure: ESOPHAGOGASTRODUODENOSCOPY (EGD) WITH PROPOFOL ;  Surgeon: Maryruth Ole ONEIDA, MD;  Location: [facility] ENDOSCOPY;  Service: Endoscopy;  Laterality: N/A;   HEMORRHOID SURGERY     HYSTEROSCOPY WITH D & C N/A 10/14/2021   Procedure: DILATATION AND CURETTAGE /HYSTEROSCOPY;  Surgeon: Verdon Keen, MD;  Location: [facility] ORS;  Service: Gynecology;  Laterality: N/A;   IMPLANTABLE CONTACT LENS IMPLANTATION     LYSIS OF ADHESION  10/14/2021   Procedure: LYSIS OF VAGINAL ADHESIONS;  Surgeon: Verdon Keen, MD;  Location: [facility] ORS;  Service: Gynecology;;   TONSILLECTOMY     TUBAL LIGATION     Patient Active Problem List   Diagnosis Date Noted   [employer] Federation of Gyne[employer] and Obstetrics (FIGO) malignant neoplasm of endometrium stage IB (HCC) 01/19/2022   Status post surgery 10/14/2021   Aspiration pneumonia (HCC) 08/11/2021  CAP (community acquired pneumonia) 08/10/2021   Chest pain 08/10/2021   Dysphagia 08/10/2021   Type 2 diabetes mellitus with peripheral neuropathy (HCC) 08/10/2021   PAD (peripheral artery disease) (HCC) 02/22/2020   Hypoglycemia due to type 2 diabetes mellitus (HCC)    Hypothermia    Dehydration    Hypoglycemia 01/09/2020   Moderate protein-calorie malnutrition (HCC) 01/08/2020   Fall    Hyponatremia    Hyperglycemia 12/05/2019   Acute lower UTI 12/05/2019   Depression    Lumbar compression fracture (HCC)    Weakness of left foot 09/14/2019   Hyperlipidemia 08/13/2019   Hypercalcemia  05/01/2019   Leukopenia 01/17/2019   Stage 3 chronic kidney disease (HCC) 01/17/2019   Benign hypertensive kidney disease with chronic kidney disease 01/16/2019   Hyposmolality and/or hyponatremia 01/16/2019   Secondary hyperparathyroidism of renal origin (HCC) 01/16/2019   Carotid artery disease (HCC) 08/10/2017   Chronic midline low back pain without sciatica 03/16/2017   Primary osteoarthritis of both hands 01/16/2017   Benign essential tremor 08/12/2015   MGUS (monoclonal gammopathy of unknown significance) 07/08/2015   Mixed incontinence 11/09/2014   Atrophic vaginitis 07/28/2014   Encounter for general adult medical examination without abnormal findings 05/28/2014   Major depression in remission (HCC) 11/27/2013   Morbid obesity (HCC) 11/16/2013   Microalbuminuria 08/06/2013   Breath shortness 08/02/2013   Acid reflux 06/16/2013   Benign hypertension 06/16/2013   Type 2 diabetes mellitus (HCC) 06/16/2013   Hypothyroidism 06/16/2013   Obstructive apnea 06/16/2013   Hyperlipidemia associated with type 2 diabetes mellitus (HCC) 94/68/7984   Oral lichen planus 05/10/2011   Lichen planus 11/09/2010    PCP: Lenon Layman ORN, MD  REFERRING PROVIDER: Paich, Kaitlin, PA-C   REFERRING DIAG: R29.6 (ICD-10-CM) - Repeated falls   RATIONALE FOR EVALUATION AND TREATMENT: Rehabilitation  THERAPY DIAG: Unsteadiness on feet  Muscle weakness (generalized)  ONSET DATE: Approximately 2 years  FOLLOW-UP APPT SCHEDULED WITH REFERRING PROVIDER: Yes   PERTINENT HISTORY:  Pt referred by neurology for issues with imbalance and falls. She states that she has been having difficulty with her balance for approximately 2 years. She reports dragging her feet occasionally when she walks. She has had 3 falls in the last 6 months but denies any significant injuries except for skin lacerations. She has a history of small infarcts involving right occipital lobe with left homonymous hemianopsia. Pt  actually reports she is lacking midline vision bilaterally but per chart has poor insight into her deficits. No new neurologic symptoms recently. She has a medical history significant for diabetes mellitus with complications of stage III chronic kidney disease, depression, hypothyroidism, hypertension, cognitive changes, recurrent falls and GERD. Also has a history of endometrial adenocarcinoma, s/p hysterectomy and bilateral SPO, not on chemotherapy or radiation secondary to lichen planus.  MRI brain with and without contrast 01/02/2020: No acute intracranial abnormality. Moderate parenchymal volume loss and chronic microvascular ischemic white matter disease. No acute fracture or traumatic listhesis of the cervical spine. Multilevel degenerative changes of the cervical spine as described above. Cervical and intracranial atherosclerosis.   MRI brain 11/27/22 without results yet  CT Cervical Spine without contrast 12/05/2019: No acute fracture or traumatic listhesis of the cervical spine. Multilevel degenerative changes of the cervical spine as described above. Cervical and intracranial atherosclerosis. Emphysema (ICD10-J43.9).   CT thoracic Spine without contrast 12/05/2019: L1 compression deformity, better detailed dedicated lumbar imaging. Slightly sclerotic endplate change and central cupping in the inferior endplate T12 with a rounded lucency  and extension of nitrogenous gas from the disc space is favored to reflect an acute to subacute Schmorl's node formation rather than true compression deformity. No other acute thoracic spine fracture or vertebral body height loss. Multilevel degenerative changes of the thoracic spine without significant spinal canal stenosis or neural foraminal narrowing within the thoracic levels. 3 mm solid nodule in the anterosuperior segment right upper lobe.   Pain: Yes, arthritis pain in spine Numbness/Tingling: Yes, bilateral LE neuropathy, some bilateral hand tingling   Focal Weakness: Yes, L sided weakness per subjective report Recent changes in overall health/medication: Yes, worsening kidney function. Recently started Jardiance.  Prior history of physical therapy for balance:  No Dominant hand: left, uses both hands Imaging: Yes, see above  PRECAUTIONS: Fall  WEIGHT BEARING RESTRICTIONS: No  FALLS: Has patient fallen in last 6 months? Yes. Number of falls 3 , Directional pattern for falls: Yes to the left  Living Environment Lives with: lives alone Lives in: House/apartment, double wide mobile home Stairs: single leg home, ramp on front to enter, 8 steps on back with bilateral rails that she can reach simulataneously Has following equipment at home: Single point cane, Walker - 2 wheeled, Environmental Consultant - 4 wheeled, and Wheelchair (manual) Tub shower (no grab bars), shower seat  Prior level of function: Independent  Occupational demands: Retired  Presenter, Broadcasting: Social Research Officer, Government, watching sports  Patient Goals: Improve balance. I need to learn how to slow down.     OBJECTIVE (data from initial evaluation unless otherwise dated):   Patient Surveys  FOTO: 79, predicted improvement to 60 ABC: 77.5%   Posture: Forward head with upper thoracic posture   LE MMT: MMT (out of 5) Right  Left   Hip flexion 4 4  Hip extension    Hip abduction (seated) 4 4  Hip adduction (setaed) 4 4  Hip internal rotation    Hip external rotation    Knee flexion (seated) 5 5  Knee extension 5 5  Ankle dorsiflexion 5 5  Ankle plantarflexion    (* = pain; Blank rows = not tested)  UE strength testing WNL and symmetrical  Transfers: Assistive device utilized: None  Sit to stand: Complete Independence Stand to sit: Complete Independence Chair to chair: Complete Independence Floor:  Not performed  Curb:  Deferred  Stairs: Level of Assistance: Modified independence Stair Negotiation Technique: Step to Pattern with Single Rail on Right Number of Stairs:  4  Height of Stairs: 6  Comments: Reciprocal ascending and step-to descending  Gait: Gait pattern: decreased step length- Right and decreased step length- Left Distance walked: 100' Assistive device utilized: None Level of assistance: Complete Independence Comments: Decreased self-selected speed  Clinical Test of Sensory Interaction for Balance (CTSIB): CONDITION TIME  Eyes open, firm surface 30 seconds  Eyes closed, firm surface 7 seconds  Eyes open, foam surface 30 seconds  Eyes closed, foam surface 1.8 seconds    Functional Outcome Measures  Results Comments  BERG 51/56 Mild balance deficits  FGA 17/30   TUG 8.3 seconds WNL  5TSTS 11.0 seconds WNL  10 Meter Gait Speed Self-selected: 16.8s = 0.60 m/s; Fastest: 8.5s = 1.18 m/s Decreased self selected gait speed  MiniBESTest 17/28    (Blank rows = not tested)     TODAY'S TREATMENT     SUBJECTIVE STATEMENT:   Patient reports syncopal episodes yesterday evening and having to sit on floor abruptly due to faintness. Pt had N&V at the time. Pt had low blood sugar; down to  68. Pt had small candy and got blood sugar at up. After this, episode of lightheadedness did resolve. She reports some coccygeal pain after sitting on floor; better this AM after taking Tylenol . Patient reports no chest pain/tightness, shoulder pain, SOB, faintness/lightheadedness. No current nausea or GI symptoms. She reports feeling more weak today.     Today's Vitals   01/23/23 1120  BP: (!) 151/60  Pulse: 88   There is no height or weight on file to calculate BMI. *Unable to attain SpO2 readings due to acrylic nail polish    Therapeutic Exercise - improved strength as needed to improve performance of CKC activities/functional movements and as needed for power production to prevent fall during episode of large postural perturbation   NuStep; Level 5; x 5 minutes - for improved soft tissue mobility and increased tissue temperature to improve muscle  performance   -subjective gathered during portion of this time.   PATIENT EDUCATION: Reviewed current progress and discussed continued POC.    *not today* Consecutive lateral step-over, (3) 6-inch hurdles, in // bars; 5x D/B 3-way kick; 1x8 with bilat LE Minisquat, in // bars; 1x10  -for HEP review   Neuromuscular Re-education - for improved sensory integration, static and dynamic postural control, equilibrium and non-equilibrium coordination as needed for negotiating home and community environment and stepping over obstacles  -BERG Balance Scale completed   [facility] PT Assessment - 01/23/23 0001       Berg Balance Test   Sit to Stand Able to stand without using hands and stabilize independently    Standing Unsupported Able to stand safely 2 minutes    Sitting with Back Unsupported but Feet Supported on Floor or Stool Able to sit safely and securely 2 minutes    Stand to Sit Sits safely with minimal use of hands    Transfers Able to transfer safely, minor use of hands    Standing Unsupported with Eyes Closed Able to stand 10 seconds safely    Standing Unsupported with Feet Together Able to place feet together independently and stand 1 minute safely    From Standing, Reach Forward with Outstretched Arm Can reach forward >12 cm safely (5)    From Standing Position, Pick up Object from Floor Able to pick up shoe safely and easily    From Standing Position, Turn to Look Behind Over each Shoulder Looks behind from both sides and weight shifts well    Turn 360 Degrees Able to turn 360 degrees safely in 4 seconds or less    Standing Unsupported, Alternately Place Feet on Step/Stool Able to stand independently and safely and complete 8 steps in 20 seconds    Standing Unsupported, One Foot in Front Able to place foot tandem independently and hold 30 seconds    Standing on One Leg Able to lift leg independently and hold equal to or more than 3 seconds    Total Score 53               Standing on Airex with perturbations, multi-directional; 2 x 1-minute  Standing on Airex; semitandem; 1 x 30 seconds each position Standing on Airex; eyes closed and feet together; 30 sec today  Multi-directional stepping on blue star (5 directions; anterior, anterolateral R/L, lateral R/L); x5 ea dir    *next visit* In // bars: dynamic march, 4-lb ankle weights; 5x D/B  -PT SBA Toe tapping; 12-inch step; pt standing on Airex; 1x10 alternating Consecutive forward step-over; (3) 6-inch hurdles, 2 Airex pads step  up/down, and alternating toe taps with 4 cones on R and L; in hallway; 2x D/B   *not today* Gait in 1/2 of 70-ft hallway with ball pass for dual tasking; forward to retro stepping; 3x D/B Gait with verbal cueing (go, slow, stop and turn); x 5 minutes Alternating lateral step-over, 6-inch hurdle; 2x10 alternating R/L Toe tapping; 6-inch step, 1x10 alternating Forward step-over (then return to bilateral standing with target in front), 5-lb ankle weight; 1x10 with each LE Standing feet together, eyes closed; 1 x 30 sec Standing on Airex; feet together; x 30 seconds Gait down 70-ft hallway with scanning for visual targets on wall; identifying letters; x2 lengths of hall     PATIENT EDUCATION:  Education details: see above for patient education details Person educated: Patient Education method: Explanation Education comprehension: verbalized understanding   HOME EXERCISE PROGRAM:  Access Code: [email or web address] URL: [email or web address] Date: 01/02/2023 Prepared by: Venetia Endo  Exercises - Forward Step Over with Counter Support  - 2 x daily - 7 x weekly - 2 sets - 10 reps - Standing 3-Way Kick  - 2 x daily - 7 x weekly - 2 sets - 10 reps - Mini Squat with Counter Support  - 2 x daily - 7 x weekly - 2 sets - 10 reps - Standing Forward Toe Taps on Box (BKA)  - 2 x daily - 7 x weekly - 2 sets - 10 reps   ASSESSMENT:  CLINICAL IMPRESSION: Pt  had significant near-syncopal episode yesterday with pt feeling faintness and as though she might pass out. She reports feeling generally well today with exception of some weakness. She is not in hypotensive range or hypertensive urgency range per vitals reading today. Pt does not have signs of acute cardiac ischemia or emergent signs at this time. We recommended following up with PCP regarding this episode. Patient is in lower fall risk category per current BERG score. She has made good progress in all functional outcome measures, but she is one point shy of attaining MCID and established PT goal for BERG, Mini-BESTest, and FGA. Pt feels that her ability to perform rapid corrective action to re-gain balance has improved. She will benefit from further work on postural control, dynamic and reactive balance, dual task training, and obstacle negotiation/toe clearance drills to improve falls risk and performance in testing. Pt will continue to benefit from skilled PT services to address deficits and improve function.   OBJECTIVE IMPAIRMENTS: decreased balance, decreased cognition, and decreased strength.   ACTIVITY LIMITATIONS: squatting, stairs, and transfers  PARTICIPATION LIMITATIONS: cleaning, laundry, shopping, and community activity  PERSONAL FACTORS: Age, Past/current experiences, Time since onset of injury/illness/exacerbation, and 3+ comorbidities: CVA, visual deficits, cognitive deficits, hearing impairment, DM, and CKD  are also affecting patient's functional outcome.   REHAB POTENTIAL: Fair    CLINICAL DECISION MAKING: Unstable/unpredictable  EVALUATION COMPLEXITY: High   GOALS: Goals reviewed with patient? No  SHORT TERM GOALS: Target date: 01/30/2023  Pt will be independent with HEP in order to improve strength and balance in order to decrease fall risk and improve function at home. Baseline:  12/21/22: Baseline HEP initiated.    01/19/23: Pt reports completing exercises usually  2x/day, first bout may be late AM.  Goal status: ACHIEVED   LONG TERM GOALS: Target date: 03/13/2023  Pt will increase FOTO to at least 60 to demonstrate significant improvement in function at home related to balance  Baseline: 12/19/22: 49/60  01/19/23: 67/60 Goal status: ACHIEVED  2.  Pt  will improve BERG by at least 3 points in order to demonstrate clinically significant improvement in balance.   Baseline: 12/19/22: 51/56  01/23/23: 53/56 Goal status: IN PROGRESS  3.  Pt will improve mini-BESTest by at least 4 points in order to demonstrate clinically significant improvement in balance and decreased risk for falls       Baseline: 12/19/22: To be completed next visit.    12/21/22: 17/28  01/19/23: 20/28 Goal status: IN PROGRESS  4. Pt will increase self-selected 63m gait speed to at least 0.80 m/s in order to demonstrate clinically significant improvement in community ambulation.   Baseline: 12/19/22: Self-selected: 16.8s = 0.60 m/s    01/19/23: Self-selected: 16.8s = 0.87 m/s Goal status: ACHIEVED  5.  Pt will improve FGA by at least 5 points in order to demonstrate clinically significant improvement in balance and decreased risk for falls       Baseline: 12/26/22: 17/30  01/19/23: 21/30 Goal status: IN PROGRESS    PLAN: PT FREQUENCY: 2x/week  PT DURATION: 4-6 weeks  PLANNED INTERVENTIONS: Therapeutic exercises, Therapeutic activity, Neuromuscular re-education, Balance training, Gait training, Patient/Family education, Self Care, Joint mobilization, Joint manipulation, Vestibular training, Canalith repositioning, Orthotic/Fit training, DME instructions, Dry Needling, Electrical stimulation, Spinal manipulation, Spinal mobilization, Cryotherapy, Moist heat, Taping, Traction, Ultrasound, Ionotophoresis 4mg /ml Dexamethasone , Manual therapy, and Re-evaluation.  PLAN FOR NEXT SESSION: Continue with postural control training, integrating eyes-closed tasks. Continue with LE strengthening  and obstacle negotiation work; strategies for foot clearance and decreased shuffling.     Venetia Endo, PT, DPT #[id number]  Venetia ONEIDA Endo 01/23/2023, 12:15 PM

## 2023-01-24 NOTE — Therapy (Signed)
 OUTPATIENT PHYSICAL THERAPY TREATMENT   Patient Name: Abigail Walker MRN: [id number] DOB:October 26, 1945, 78 y.o., female Today's Date: 01/25/2023  END OF SESSION:  PT End of Session - 01/25/23 1102     Visit Number 12    Number of Visits 25    Date for PT Re-Evaluation 02/13/23    Authorization Type eval: 12/19/22    PT Start Time 1108    PT Stop Time 1155    PT Time Calculation (min) 47 min    Activity Tolerance Patient tolerated treatment well    Behavior During Therapy [facility] for tasks assessed/performed              Past Medical History:  Diagnosis Date   Adult hypothyroidism 06/16/2013   Anemia    Aortic atherosclerosis (HCC)    Arthritis    Benign hypertension 06/16/2013   Carotid artery disease (HCC)    a.) carotid US  02/10/2020: 1-39% BICA; b.) carotid US  09/22/2021: 1-39% BICA   Carlin Abrahams syndrome    CKD (chronic kidney disease), stage III (HCC)    Coronary artery calcification seen on CT scan    Depression    Esophageal spasm    Esophageal stricture    GERD (gastroesophageal reflux disease)    HBV (hepatitis B virus) infection    High blood pressure    HLD (hyperlipidemia)    Hypothyroidism    LBBB (left bundle branch block)    Lichen planus    Long term current use of antithrombotics/antiplatelets    a.) clopidogrel    Mixed incontinence    OSA on CPAP 06/16/2013   Pneumonia 2023   RLS (restless legs syndrome)    Secondary hyperparathyroidism of renal origin (HCC)    Skin cancer, basal cell    Sleep apnea    Stroke (HCC)    Thyroid  disease    Type 2 diabetes mellitus treated with insulin  [facility])    Past Surgical History:  Procedure Laterality Date   CATARACT EXTRACTION Bilateral    COLONOSCOPY  2009   COLONOSCOPY WITH PROPOFOL  N/A 11/19/2021   Procedure: COLONOSCOPY WITH PROPOFOL ;  Surgeon: Maryruth Ole ONEIDA, MD;  Location: [facility] ENDOSCOPY;  Service: Endoscopy;  Laterality: N/A;  DM   DILATATION & CURETTAGE/HYSTEROSCOPY WITH MYOSURE  10/14/2021    Procedure: MYOSURE POLYPECTOMY;  Surgeon: Verdon Keen, MD;  Location: [facility] ORS;  Service: Gynecology;;   ESOPHAGOGASTRODUODENOSCOPY     2009, 2011   ESOPHAGOGASTRODUODENOSCOPY (EGD) WITH PROPOFOL  N/A 06/08/2015   Procedure: ESOPHAGOGASTRODUODENOSCOPY (EGD) WITH PROPOFOL ;  Surgeon: Deward CINDERELLA Piedmont, MD;  Location: [facility]OSCOPY;  Service: Gastroenterology;  Laterality: N/A;   ESOPHAGOGASTRODUODENOSCOPY (EGD) WITH PROPOFOL  N/A 11/19/2021   Procedure: ESOPHAGOGASTRODUODENOSCOPY (EGD) WITH PROPOFOL ;  Surgeon: Maryruth Ole ONEIDA, MD;  Location: [facility] ENDOSCOPY;  Service: Endoscopy;  Laterality: N/A;   HEMORRHOID SURGERY     HYSTEROSCOPY WITH D & C N/A 10/14/2021   Procedure: DILATATION AND CURETTAGE /HYSTEROSCOPY;  Surgeon: Verdon Keen, MD;  Location: [facility] ORS;  Service: Gynecology;  Laterality: N/A;   IMPLANTABLE CONTACT LENS IMPLANTATION     LYSIS OF ADHESION  10/14/2021   Procedure: LYSIS OF VAGINAL ADHESIONS;  Surgeon: Verdon Keen, MD;  Location: [facility] ORS;  Service: Gynecology;;   TONSILLECTOMY     TUBAL LIGATION     Patient Active Problem List   Diagnosis Date Noted   [employer] Federation [employer] Gyne[employer] and Obstetric[employer] (FIGO) malignant neoplasm of endometrium stage IB (HCC) 01/19/2022   Status post surgery 10/14/2021   Aspiration pneumonia (HCC) 08/11/2021  CAP (community acquired pneumonia) 08/10/2021   Chest pain 08/10/2021   Dysphagia 08/10/2021   Type 2 diabetes mellitus with peripheral neuropathy (HCC) 08/10/2021   PAD (peripheral artery disease) (HCC) 02/22/2020   Hypoglycemia due to type 2 diabetes mellitus (HCC)    Hypothermia    Dehydration    Hypoglycemia 01/09/2020   Moderate protein-calorie malnutrition (HCC) 01/08/2020   Fall    Hyponatremia    Hyperglycemia 12/05/2019   Acute lower UTI 12/05/2019   Depression    Lumbar compression fracture (HCC)    Weakness of left foot 09/14/2019   Hyperlipidemia 08/13/2019   Hypercalcemia 05/01/2019   Leukopenia  01/17/2019   Stage 3 chronic kidney disease (HCC) 01/17/2019   Benign hypertensive kidney disease with chronic kidney disease 01/16/2019   Hyposmolality and/or hyponatremia 01/16/2019   Secondary hyperparathyroidism of renal origin (HCC) 01/16/2019   Carotid artery disease (HCC) 08/10/2017   Chronic midline low back pain without sciatica 03/16/2017   Primary osteoarthritis of both hands 01/16/2017   Benign essential tremor 08/12/2015   MGUS (monoclonal gammopathy of unknown significance) 07/08/2015   Mixed incontinence 11/09/2014   Atrophic vaginitis 07/28/2014   Encounter for general adult medical examination without abnormal findings 05/28/2014   Major depression in remission (HCC) 11/27/2013   Morbid obesity (HCC) 11/16/2013   Microalbuminuria 08/06/2013   Breath shortness 08/02/2013   Acid reflux 06/16/2013   Benign hypertension 06/16/2013   Type 2 diabetes mellitus (HCC) 06/16/2013   Hypothyroidism 06/16/2013   Obstructive apnea 06/16/2013   Hyperlipidemia associated with type 2 diabetes mellitus (HCC) 94/[id number]/7984   Oral lichen planus 05/10/2011   Lichen planus 11/09/2010    PCP: Lenon Layman ORN, MD  REFERRING PROVIDER: Paich, Kaitlin, PA-C   REFERRING DIAG: R29.6 (ICD-10-CM) - Repeated falls   RATIONALE FOR EVALUATION AND TREATMENT: Rehabilitation  THERAPY DIAG: Unsteadiness on feet  Muscle weakness (generalized)  ONSET DATE: Approximately 2 years  FOLLOW-UP APPT SCHEDULED WITH REFERRING PROVIDER: Yes   PERTINENT HISTORY:  Pt referred by neurology for issues with imbalance and falls. She states that she has been having difficulty with her balance for approximately 2 years. She reports dragging her feet occasionally when she walks. She has had 3 falls in the last 6 months but denies any significant injuries except for skin lacerations. She has a history of small infarcts involving right occipital lobe with left homonymous hemianopsia. Pt actually reports she is  lacking midline vision bilaterally but per chart has poor insight into her deficits. No new neurologic symptoms recently. She has a medical history significant for diabetes mellitus with complications of stage III chronic kidney disease, depression, hypothyroidism, hypertension, cognitive changes, recurrent falls and GERD. Also has a history of endometrial adenocarcinoma, s/p hysterectomy and bilateral SPO, not on chemotherapy or radiation secondary to lichen planus.  MRI brain with and without contrast 01/02/2020: No acute intracranial abnormality. Moderate parenchymal volume loss and chronic microvascular ischemic white matter disease. No acute fracture or traumatic listhesis of the cervical spine. Multilevel degenerative changes of the cervical spine as described above. Cervical and intracranial atherosclerosis.   MRI brain 11/27/22 without results yet  CT Cervical Spine without contrast 12/05/2019: No acute fracture or traumatic listhesis of the cervical spine. Multilevel degenerative changes of the cervical spine as described above. Cervical and intracranial atherosclerosis. Emphysema (ICD10-J43.9).   CT thoracic Spine without contrast 12/05/2019: L1 compression deformity, better detailed dedicated lumbar imaging. Slightly sclerotic endplate change and central cupping in the inferior endplate T12 with a rounded lucency  and extension of nitrogenous gas from the disc space is favored to reflect an acute to subacute Schmorl's node formation rather than true compression deformity. No other acute thoracic spine fracture or vertebral body height loss. Multilevel degenerative changes of the thoracic spine without significant spinal canal stenosis or neural foraminal narrowing within the thoracic levels. 3 mm solid nodule in the anterosuperior segment right upper lobe.   Pain: Yes, arthritis pain in spine Numbness/Tingling: Yes, bilateral LE neuropathy, some bilateral hand tingling  Focal Weakness: Yes, L  sided weakness per subjective report Recent changes in overall health/medication: Yes, worsening kidney function. Recently started Jardiance.  Prior history of physical therapy for balance:  No Dominant hand: left, uses both hands Imaging: Yes, see above  PRECAUTIONS: Fall  WEIGHT BEARING RESTRICTIONS: No  FALLS: Has patient fallen in last 6 months? Yes. Number of falls 3 , Directional pattern for falls: Yes to the left  Living Environment Lives with: lives alone Lives in: House/apartment, double wide mobile home Stairs: single leg home, ramp on front to enter, 8 steps on back with bilateral rails that she can reach simulataneously Has following equipment at home: Single point cane, Walker - 2 wheeled, Environmental Consultant - 4 wheeled, and Wheelchair (manual) Tub shower (no grab bars), shower seat  Prior level of function: Independent  Occupational demands: Retired  Presenter, Broadcasting: Social Research Officer, Government, watching sports  Patient Goals: Improve balance. I need to learn how to slow down.     OBJECTIVE (data from initial evaluation unless otherwise dated):   Patient Surveys  FOTO: 53, predicted improvement to 60 ABC: 77.5%   Posture: Forward head with upper thoracic posture   LE MMT: MMT (out of 5) Right  Left   Hip flexion 4 4  Hip extension    Hip abduction (seated) 4 4  Hip adduction (setaed) 4 4  Hip internal rotation    Hip external rotation    Knee flexion (seated) 5 5  Knee extension 5 5  Ankle dorsiflexion 5 5  Ankle plantarflexion    (* = pain; Blank rows = not tested)  UE strength testing WNL and symmetrical  Transfers: Assistive device utilized: None  Sit to stand: Complete Independence Stand to sit: Complete Independence Chair to chair: Complete Independence Floor:  Not performed  Curb:  Deferred  Stairs: Level of Assistance: Modified independence Stair Negotiation Technique: Step to Pattern with Single Rail on Right Number of Stairs: 4  Height of Stairs:  6  Comments: Reciprocal ascending and step-to descending  Gait: Gait pattern: decreased step length- Right and decreased step length- Left Distance walked: 100' Assistive device utilized: None Level of assistance: Complete Independence Comments: Decreased self-selected speed  Clinical Test of Sensory Interaction for Balance (CTSIB): CONDITION TIME  Eyes open, firm surface 30 seconds  Eyes closed, firm surface 7 seconds  Eyes open, foam surface 30 seconds  Eyes closed, foam surface 1.8 seconds    Functional Outcome Measures  Results Comments  BERG 51/56 Mild balance deficits  FGA 17/30   TUG 8.3 seconds WNL  5TSTS 11.0 seconds WNL  10 Meter Gait Speed Self-selected: 16.8s = 0.60 m/s; Fastest: 8.5s = 1.18 m/s Decreased self selected gait speed  MiniBESTest 17/28    (Blank rows = not tested)     TODAY'S TREATMENT     SUBJECTIVE STATEMENT:   Patient reports having some coccygeal pain after sitting abruptly onto floor this past weekend during syncopal episode. Patient reports no further episodes of dizziness/faintness. Patient reports doing well after  last visit.     Therapeutic Exercise - improved strength as needed to improve performance of CKC activities/functional movements and as needed for power production to prevent fall during episode of large postural perturbation   NuStep; Level 5; x 5 minutes - for improved soft tissue mobility and increased tissue temperature to improve muscle performance   -subjective gathered during portion of this time.   PATIENT EDUCATION: Discussed modifications to exercise or activity modification at home if needed due to coccygeal pain. We reviewed current strategies for mitigating pain.    *not today* Consecutive lateral step-over, (3) 6-inch hurdles, in // bars; 5x D/B 3-way kick; 1x8 with bilat LE Minisquat, in // bars; 1x10  -for HEP review   Neuromuscular Re-education - for improved sensory integration, static and dynamic  postural control, equilibrium and non-equilibrium coordination as needed for negotiating home and community environment and stepping over obstacles   In // bars: dynamic march, 4-lb ankle weights; 5x D/B  -PT SBA  Standing on Airex with perturbations, multi-directional; 2 x 1-minute  Tandem stance, in // bars; 2 x 20-30 sec, R and LLE in back  Plyoboard forward toss with 4.4-lb ball; standing on Airex; 2 x 1-minute bouts   Multi-directional stepping on blue star (5 directions; anterior, anterolateral R/L, lateral R/L); x5 ea dir  Consecutive forward step-over; (3) 6-inch hurdles, (1) Airex pad step up/down, and long Airex tandem walk; in hallway; 3x D/B  -careful CGA during Airex tandem walk  -intermittent lateral LOB with Airex tandem walk, pt able to perform crossover step and re-gain postural control without notable assist from PT   *not today* Toe tapping; 12-inch step; pt standing on Airex; 1x10 alternating Gait in 1/2 of 70-ft hallway with ball pass for dual tasking; forward to retro stepping; 3x D/B Gait with verbal cueing (go, slow, stop and turn); x 5 minutes Alternating lateral step-over, 6-inch hurdle; 2x10 alternating R/L Toe tapping; 6-inch step, 1x10 alternating Forward step-over (then return to bilateral standing with target in front), 5-lb ankle weight; 1x10 with each LE Standing feet together, eyes closed; 1 x 30 sec Standing on Airex; feet together; x 30 seconds Gait down 70-ft hallway with scanning for visual targets on wall; identifying letters; x2 lengths of hall     PATIENT EDUCATION:  Education details: see above for patient education details Person educated: Patient Education method: Explanation Education comprehension: verbalized understanding   HOME EXERCISE PROGRAM:  Access Code: [email or web address] URL: [email or web address] Date: 01/02/2023 Prepared by: Venetia Endo  Exercises - Forward Step Over with Counter Support  - 2 x daily  - 7 x weekly - 2 sets - 10 reps - Standing 3-Way Kick  - 2 x daily - 7 x weekly - 2 sets - 10 reps - Mini Squat with Counter Support  - 2 x daily - 7 x weekly - 2 sets - 10 reps - Standing Forward Toe Taps on Box (BKA)  - 2 x daily - 7 x weekly - 2 sets - 10 reps   ASSESSMENT:  CLINICAL IMPRESSION: Pt has made excellent progress to date and is able to further progress with static and dynamic balance training with narrow BOS. Pt is most challenged by Tandem walk on long Airex pad today. Pt exhibits markedly improving postural control as demonstrated by performance of Tandem stance today. She has not experienced further syncopal episodes and denies any lightheadedness/dizziness this AM. Pt has reported coccydynia since abruptly sitting onto floor Sunday evening when she had dizziness/faintness with hypoglycemic  episode. This does not interfere with treatment today, and pt reports feeling better after exercise/movement. She will benefit from further work on postural control, dynamic and reactive balance, dual task training, and obstacle negotiation/toe clearance drills to improve falls risk and performance in testing. Pt will continue to benefit from skilled PT services to address deficits and improve function.   OBJECTIVE IMPAIRMENTS: decreased balance, decreased cognition, and decreased strength.   ACTIVITY LIMITATIONS: squatting, stairs, and transfers  PARTICIPATION LIMITATIONS: cleaning, laundry, shopping, and community activity  PERSONAL FACTORS: Age, Past/current experiences, Time since onset of injury/illness/exacerbation, and 3+ comorbidities: CVA, visual deficits, cognitive deficits, hearing impairment, DM, and CKD  are also affecting patient's functional outcome.   REHAB POTENTIAL: Fair    CLINICAL DECISION MAKING: Unstable/unpredictable  EVALUATION COMPLEXITY: High   GOALS: Goals reviewed with patient? No  SHORT TERM GOALS: Target date: 01/30/2023  Pt will be independent with  HEP in order to improve strength and balance in order to decrease fall risk and improve function at home. Baseline:  12/21/22: Baseline HEP initiated.    01/19/23: Pt reports completing exercises usually 2x/day, first bout may be late AM.  Goal status: ACHIEVED   LONG TERM GOALS: Target date: 03/13/2023  Pt will increase FOTO to at least 60 to demonstrate significant improvement in function at home related to balance  Baseline: 12/19/22: 49/60  01/19/23: 67/60 Goal status: ACHIEVED  2.  Pt will improve BERG by at least 3 points in order to demonstrate clinically significant improvement in balance.   Baseline: 12/19/22: 51/56  01/23/23: 53/56 Goal status: IN PROGRESS  3.  Pt will improve mini-BESTest by at least 4 points in order to demonstrate clinically significant improvement in balance and decreased risk for falls       Baseline: 12/19/22: To be completed next visit.    12/21/22: 17/28  01/19/23: 20/28 Goal status: IN PROGRESS  4. Pt will increase self-selected 25m gait speed to at least 0.80 m/s in order to demonstrate clinically significant improvement in community ambulation.   Baseline: 12/19/22: Self-selected: 16.8s = 0.60 m/s    01/19/23: Self-selected: 16.8s = 0.87 m/s Goal status: ACHIEVED  5.  Pt will improve FGA by at least 5 points in order to demonstrate clinically significant improvement in balance and decreased risk for falls       Baseline: 12/26/22: 17/30  01/19/23: 21/30 Goal status: IN PROGRESS    PLAN: PT FREQUENCY: 2x/week  PT DURATION: 4-6 weeks  PLANNED INTERVENTIONS: Therapeutic exercises, Therapeutic activity, Neuromuscular re-education, Balance training, Gait training, Patient/Family education, Self Care, Joint mobilization, Joint manipulation, Vestibular training, Canalith repositioning, Orthotic/Fit training, DME instructions, Dry Needling, Electrical stimulation, Spinal manipulation, Spinal mobilization, Cryotherapy, Moist heat, Taping, Traction,  Ultrasound, Ionotophoresis 4mg /ml Dexamethasone , Manual therapy, and Re-evaluation.  PLAN FOR NEXT SESSION: Continue with postural control training, integrating eyes-closed tasks. Continue with LE strengthening and obstacle negotiation work; strategies for foot clearance and decreased shuffling.     Venetia Endo, PT, DPT #[id number]  Venetia ONEIDA Endo 01/25/2023, 12:06 PM

## 2023-01-25 ENCOUNTER — Ambulatory Visit: Payer: Medicare Other | Admitting: Physical Therapy

## 2023-01-25 ENCOUNTER — Encounter: Payer: Self-pay | Admitting: Physical Therapy

## 2023-01-25 ENCOUNTER — Inpatient Hospital Stay: Payer: Medicare Other | Attending: Obstetrics and Gynecology | Admitting: Obstetrics and Gynecology

## 2023-01-25 ENCOUNTER — Encounter: Payer: Self-pay | Admitting: Obstetrics and Gynecology

## 2023-01-25 VITALS — BP 133/62 | HR 92 | Temp 98.6°F | Resp 20 | Wt 122.5 lb

## 2023-01-25 DIAGNOSIS — Z8542 Personal history of malignant neoplasm of other parts of uterus: Secondary | ICD-10-CM | POA: Diagnosis not present

## 2023-01-25 DIAGNOSIS — Z08 Encounter for follow-up examination after completed treatment for malignant neoplasm: Secondary | ICD-10-CM

## 2023-01-25 DIAGNOSIS — Z90710 Acquired absence of both cervix and uterus: Secondary | ICD-10-CM | POA: Diagnosis not present

## 2023-01-25 DIAGNOSIS — M6281 Muscle weakness (generalized): Secondary | ICD-10-CM

## 2023-01-25 DIAGNOSIS — Z7189 Other specified counseling: Secondary | ICD-10-CM

## 2023-01-25 DIAGNOSIS — Z9079 Acquired absence of other genital organ(s): Secondary | ICD-10-CM | POA: Diagnosis not present

## 2023-01-25 DIAGNOSIS — N904 Leukoplakia of vulva: Secondary | ICD-10-CM | POA: Insufficient documentation

## 2023-01-25 DIAGNOSIS — R2681 Unsteadiness on feet: Secondary | ICD-10-CM | POA: Diagnosis not present

## 2023-01-25 DIAGNOSIS — Z90722 Acquired absence of ovaries, bilateral: Secondary | ICD-10-CM | POA: Diagnosis not present

## 2023-01-25 NOTE — Progress Notes (Signed)
 Gynecologic Oncology Interval Visit   Referring Provider: Dr. Verdon  Chief Complaint: endometrial adenocarcinoma  Subjective:  Abigail Walker is a 78 y.o. female G1P1 who is seen in consultation from Dr. Lenon for new diagnosis of endometrial adenocarcinoma.  Patient returns today for surveillance.  She has noted area of whiteness around vulva, very red tissue and spotting. She is using the topical steroid once a day for lichen planus.   Gynecologic Oncology History Abigail Walker is a pleasant patient G1P1 who is seen in consultation from Dr. Lenon for new diagnosis of endometrial adenocarcinoma.  Seen for annual exam in May 2022, mentioned PMB. Pap was unable to be collected due to lichen, loss of architecture, and stenosis. D&C and EUA was recommended. She returns 12/22 for surgical eval but did not show for ultrasound.   08/10/21- CT Angio Chest/Abd/Pelvis Uterus and adnexa were unremarkable. No lymphadenopathy. No ascites.  5 mm solid nodule in RML of lung.   September 2023 Re-presented and  underwent D&C, EUA, myosure polypectomy, lysis of vaginal adhesions with Dr. Verdon 10/14/21.   DIAGNOSIS:  A. ENDOMETRIAL CURETTINGS:  - ENDOMETRIAL ADENOCARCINOMA, FIGO 1.  - CHRONIC ENDOMETRITIS   She has been using topical estrogen since surgery. She applies clobestasol and vaseline jelly on the external vulva.   On 12/13/2021 she underwent Robot assisted total laparoscopic hysterectomy, bilateral salpingo-oophorectomy, bilateral sentinel lymph node mapping and biopsies.   Pathology: Bilateral sentinel lymph nodes, negative for malignancy (0/2). Endometrioid adenocarcinoma of the endometrium (4.0 cm), FIGO grade 1 of 3, invading 11 mm in a 14 mm thick myometrium (>50%), with focal microcystic elongated fragmented pattern of invasion (MELF). Negative for lymphovascular space invasion.  pMMR/MSS: Expression of MLH1, MSH2, MSH6, and PMS2 is retained in the neoplastic  cells.  [id number]  Recommendation from Tumor Board at [facility] was for VBT (meets PORTEC and GOG 99 criteria). She did not have brachy due to severe vaginal stenosis/agglutination.   History of occipital stroke in 2021, cognitive impairment, falls, major depression, Charlie bonnet syndrome/hallucinations, diabetes, MUI s/p percutaneous tibial nerve stimulation 2016-2017.  She also has multiple other medical problems as noted below.    VTE Risk: No known h/o VTE or family h/o VTE   Problem List: Patient Active Problem List   Diagnosis Date Noted   [employer] Federation of Gynecology and Obstetrics (FIGO) malignant neoplasm of endometrium stage IB (HCC) 01/19/2022   Status post surgery 10/14/2021   Aspiration pneumonia (HCC) 08/11/2021   CAP (community acquired pneumonia) 08/10/2021   Chest pain 08/10/2021   Dysphagia 08/10/2021   Type 2 diabetes mellitus with peripheral neuropathy (HCC) 08/10/2021   PAD (peripheral artery disease) (HCC) 02/22/2020   Hypoglycemia due to type 2 diabetes mellitus (HCC)    Hypothermia    Dehydration    Hypoglycemia 01/09/2020   Moderate protein-calorie malnutrition (HCC) 01/08/2020   Fall    Hyponatremia    Hyperglycemia 12/05/2019   Acute lower UTI 12/05/2019   Depression    Lumbar compression fracture (HCC)    Weakness of left foot 09/14/2019   Hyperlipidemia 08/13/2019   Hypercalcemia 05/01/2019   Leukopenia 01/17/2019   Stage 3 chronic kidney disease (HCC) 01/17/2019   Benign hypertensive kidney disease with chronic kidney disease 01/16/2019   Hyposmolality and/or hyponatremia 01/16/2019   Secondary hyperparathyroidism of renal origin (HCC) 01/16/2019   Carotid artery disease (HCC) 08/10/2017   Chronic midline low back pain without sciatica 03/16/2017   Primary osteoarthritis of both hands 01/16/2017   Benign essential  tremor 08/12/2015   MGUS (monoclonal gammopathy of unknown significance) 07/08/2015   Mixed incontinence 11/09/2014    Atrophic vaginitis 07/28/2014   Encounter for general adult medical examination without abnormal findings 05/28/2014   Major depression in remission (HCC) 11/27/2013   Morbid obesity (HCC) 11/16/2013   Microalbuminuria 08/06/2013   Breath shortness 08/02/2013   Acid reflux 06/16/2013   Benign hypertension 06/16/2013   Type 2 diabetes mellitus (HCC) 06/16/2013   Hypothyroidism 06/16/2013   Obstructive apnea 06/16/2013   Hyperlipidemia associated with type 2 diabetes mellitus (HCC) 94/68/7984   Oral lichen planus 05/10/2011   Lichen planus 11/09/2010    Past Medical History: Past Medical History:  Diagnosis Date   Adult hypothyroidism 06/16/2013   Anemia    Aortic atherosclerosis (HCC)    Arthritis    Benign hypertension 06/16/2013   Carotid artery disease (HCC)    a.) carotid US  02/10/2020: 1-39% BICA; b.) carotid US  09/22/2021: 1-39% BICA   Carlin Abrahams syndrome    CKD (chronic kidney disease), stage III (HCC)    Coronary artery calcification seen on CT scan    Depression    Esophageal spasm    Esophageal stricture    GERD (gastroesophageal reflux disease)    HBV (hepatitis B virus) infection    High blood pressure    HLD (hyperlipidemia)    Hypothyroidism    LBBB (left bundle branch block)    Lichen planus    Long term current use of antithrombotics/antiplatelets    a.) clopidogrel    Mixed incontinence    OSA on CPAP 06/16/2013   Pneumonia 2023   RLS (restless legs syndrome)    Secondary hyperparathyroidism of renal origin (HCC)    Skin cancer, basal cell    Sleep apnea    Stroke (HCC)    Thyroid  disease    Type 2 diabetes mellitus treated with insulin  [facility])     Past Surgical History: Past Surgical History:  Procedure Laterality Date   CATARACT EXTRACTION Bilateral    COLONOSCOPY  2009   COLONOSCOPY WITH PROPOFOL  N/A 11/19/2021   Procedure: COLONOSCOPY WITH PROPOFOL ;  Surgeon: Maryruth Ole DASEN, MD;  Location: [facility] ENDOSCOPY;  Service: Endoscopy;   Laterality: N/A;  DM   DILATATION & CURETTAGE/HYSTEROSCOPY WITH MYOSURE  10/14/2021   Procedure: MYOSURE POLYPECTOMY;  Surgeon: Verdon Keen, MD;  Location: [facility] ORS;  Service: Gynecology;;   ESOPHAGOGASTRODUODENOSCOPY     2009, 2011   ESOPHAGOGASTRODUODENOSCOPY (EGD) WITH PROPOFOL  N/A 06/08/2015   Procedure: ESOPHAGOGASTRODUODENOSCOPY (EGD) WITH PROPOFOL ;  Surgeon: Deward CINDERELLA Piedmont, MD;  Location: [facility] ENDOSCOPY;  Service: Gastroenterology;  Laterality: N/A;   ESOPHAGOGASTRODUODENOSCOPY (EGD) WITH PROPOFOL  N/A 11/19/2021   Procedure: ESOPHAGOGASTRODUODENOSCOPY (EGD) WITH PROPOFOL ;  Surgeon: Maryruth Ole DASEN, MD;  Location: [facility] ENDOSCOPY;  Service: Endoscopy;  Laterality: N/A;   HEMORRHOID SURGERY     HYSTEROSCOPY WITH D & C N/A 10/14/2021   Procedure: DILATATION AND CURETTAGE /HYSTEROSCOPY;  Surgeon: Verdon Keen, MD;  Location: [facility] ORS;  Service: Gynecology;  Laterality: N/A;   IMPLANTABLE CONTACT LENS IMPLANTATION     LYSIS OF ADHESION  10/14/2021   Procedure: LYSIS OF VAGINAL ADHESIONS;  Surgeon: Verdon Keen, MD;  Location: [facility] ORS;  Service: Gynecology;;   TONSILLECTOMY     TUBAL LIGATION      Past Gynecologic History:  Menarche: age 53 Post menopausal   OB History:  OB History  No obstetric history on file.  G1P1- vaginal delivery - her daughter has deceased  Family History: Family History  Problem Relation  Age of Onset   Alcohol abuse Mother    Cancer Mother    Heart disease Father    Kidney failure Maternal Aunt     Social History: Social History   Socioeconomic History   Marital status: Divorced    Spouse name: Not on file   Number of children: Not on file   Years of education: Not on file   Highest education level: Some college, no degree  Occupational History   Not on file  Tobacco Use   Smoking status: Former    Current packs/day: 0.00    Types: Cigarettes    Quit date: 07/27/1993    Years since quitting: 29.5   Smokeless tobacco: Never    Tobacco comments:    quit 20 years  Vaping Use   Vaping status: Never Used  Substance and Sexual Activity   Alcohol use: No    Alcohol/week: 0.0 standard drinks of alcohol   Drug use: No   Sexual activity: Not Currently    Birth control/protection: Post-menopausal  Other Topics Concern   Not on file  Social History Narrative   Lives alone   Social Drivers of Health   Financial Resource Strain: Medium Risk (06/07/2022)   Received from [facility], [facility]   Overall Financial Resource Strain (CARDIA)    Difficulty of Paying Living Expenses: Somewhat hard  Food Insecurity: No Food Insecurity (06/07/2022)   Received from [facility], [facility]   Hunger Vital Sign    Worried About Running Out of Food in the Last Year: Never true    Ran Out of Food in the Last Year: Never true  Transportation Needs: No Transportation Needs (06/07/2022)   Received from [facility], [facility]   [facility] - Transportation    In the past 12 months, has lack of transportation kept you from medical appointments or from getting medications?: No    Lack of Transportation (Non-Medical): No  Physical Activity: Inactive (12/01/2021)   Received from [facility], [facility]   Exercise Vital Sign    Days of Exercise per Week: 0 days    Minutes of Exercise per Session: 0 min  Stress: Not on file  Social Connections: Socially Isolated (12/01/2021)   Received from [facility], [facility]   Social Connection and Isolation Panel [NHANES]    Frequency of Communication with Friends and Family: Never    Frequency of Social Gatherings with Friends and Family: Never    Attends Religious Services: Never    Database Administrator or Organizations: No    Attends Banker Meetings: Never    Marital Status: Divorced  Careers Information Officer Violence: Not on file    Allergies: Allergies  Allergen Reactions   Amoxicillin -Pot Clavulanate Rash   Metformin And Related Nausea And Vomiting   Sulfa Antibiotics Nausea And Vomiting   Hydrocortisone-Iodoquinol  [Hydrocortisone-Iodoquinol] Rash    Patient reports that she does not believe that she has allergy to this, that she was receiving many medications at once.    Current Medications: Current Outpatient Medications  Medication Sig Dispense Refill   acetaminophen  (TYLENOL ) 500 MG tablet Take 500 mg by mouth every 6 (six) hours as needed for moderate pain.     [START ON 02/12/2023] buPROPion  (WELLBUTRIN  XL) 150 MG 24 hr tablet Take 3 tablets (450 mg total) by mouth daily. 270 tablet 0  Cholecalciferol  (VITAMIN D3) 50 MCG (2000 UT) TABS Take 2 tablets by mouth daily.     clobetasol  ointment (TEMOVATE ) 0.05 % APPLY ONE APPLICATION TOPICALLY DAILY. VAGINA 60 g 0   clopidogrel  (PLAVIX ) 75 MG tablet Take 1 tablet (75 mg total) by mouth at bedtime. 30 tablet 0   cyanocobalamin (VITAMIN B12) 1000 MCG tablet Take 1,000 mcg by mouth daily.     docusate sodium  (COLACE) 100 MG capsule Take 1 capsule (100 mg total) by mouth daily. 30 capsule 0   empagliflozin (JARDIANCE) 25 MG TABS tablet Take by mouth.     ferrous sulfate  325 (65 FE) MG tablet Take 1 tablet (325 mg total) by mouth daily with breakfast. (Patient taking differently: Take 325 mg by mouth daily.) 30 tablet 0   insulin  glargine (LANTUS ) 100 UNIT/ML injection Inject 0.15 mLs (15 Units total) into the skin at bedtime. (Patient taking differently: Inject 15 Units into the skin daily. morning) 10 mL 0   Insulin  Syringe-Needle U-100 (INSULIN  SYRINGE 1CC/31GX5/16) 31G X 5/16 1 ML MISC USE ONE SYRINGE TWICE DAILY AS DIRECTED WITH INSULIN  VIALS     levothyroxine  (SYNTHROID ) 75 MCG tablet Take 1 tablet (75 mcg total) by mouth daily before breakfast. (Patient taking differently: Take 20 mcg by mouth daily before breakfast.) 30 tablet  0   lidocaine -prilocaine  (EMLA ) cream Apply 1 Application topically as needed. To your vulva for pain control 30 g 0   lisinopril  (ZESTRIL ) 20 MG tablet Take 10 mg by mouth daily.     NOVOLOG  100 UNIT/ML injection Inject 6 Units into the skin in the morning and at bedtime. (Patient taking differently: Inject 4-6 Units into the skin in the morning and at bedtime. 4 units in the morning and 6 units at bedtime) 10 mL 0   nystatin cream (MYCOSTATIN) Apply 1 application topically as needed (yeast).     PARoxetine  (PAXIL ) 20 MG tablet Take 1 tablet (20 mg total) by mouth daily. 90 tablet 0   rosuvastatin  (CRESTOR ) 40 MG tablet Take 1 tablet (40 mg total) by mouth at bedtime. 90 tablet 0   torsemide  (DEMADEX ) 10 MG tablet Take 1 tablet (10 mg total) by mouth every other day. 30 tablet 0   No current facility-administered medications for this visit.   Review of Symptoms General: fatigue, weakness, and   HEENT: no complaints  Lungs: shortness of breath  Cardiac: no complaints  GI: Nausea/vomiting history of hiatal hernia, constipation  GU: Problems with bladder function, vulvar irritation and spotting  Musculoskeletal: Back pain  Extremities: no complaints  Skin: no complaints  Neuro: Numbness and tingling  Endocrine: no complaints  Psych: no complaints       Objective:  Physical Examination:  BP 133/62   Pulse 92   Temp 98.6 F (37 C)   Resp 20   Wt 122 lb 8 oz (55.6 kg)   SpO2 100%   BMI 23.92 kg/m     Performance status: 1  GENERAL: Patient is a well elderly appearing female in no acute distress HEENT:  Atraumatic and normocephalic. NODES:  No cervical, supraclavicular, axillary, or inguinal lymphadenopathy palpated.  LUNGS: Normal respiratory effort ABDOMEN:  Soft, nontender. Nondistended. No masses/ascites/hernia/or hepatomegaly.  EXTREMITIES:  No peripheral edema.   SKIN:  Clear with no obvious rashes or skin changes.  Positive for ecchymosis in the abdominal areas where  she gives herself injections NEURO:  Nonfocal. Well oriented.  Appropriate affect.  Pelvic: EGBUS: very atrophic erythematous vulva secondary to lichen planus  with areas of leukoplakia from 6-9 o'clock Cervix: surgically absent Vagina: no lesions, no discharge or bleeding very stenotic.  Unable to see all the way to the vaginal cuff.  On palpation there were no vaginal masses or lesions.  Uterus: surgically absent BME: no palpable masses Rectovaginal: deferred     Lab Review  No labs on site today.     Assessment:  Abigail Walker is a 78 y.o. female diagnosed 11/23 with stage Ib endometrioid endometrial cancer grade 1 with HIR based on age and deep invasion (pMMR,MSS).  On 12/13/2021 she underwent robot assisted total laparoscopic hysterectomy, bilateral salpingo-oophorectomy, bilateral sentinel lymph node mapping and biopsies.  Endometrioid adenocarcinoma of the endometrium (4.0 cm), FIGO grade 1 of 3, invading 11 mm in a 14 mm thick myometrium, with focal microcystic elongated fragmented pattern of invasion (MELF). Negative for lymphovascular space invasion.  Adjuvant vaginal brachytherapy recommended, but patient has lichen planus and vagina would not accommodate vaginal mold.   Exam c/w vaginal atrophic, probable lichen planus, concern for dysplasia given leukoplakia.    Medical co-morbidities complicating care: Aspiration pneumonia/CAP (community acquired pneumonia); significant dysphagia; Type 2 diabetes mellitus with peripheral neuropathy; PAD (peripheral artery disease)/Carotid artery disease; Moderate protein-calorie malnutrition; Stage 3 chronic kidney disease; Benign hypertensive kidney disease with chronic kidney disease; MGUS (monoclonal gammopathy of unknown significance); Hypothyroidism; Obstructive apnea (Dx'd 06/16/2013 now resolved); and left bundle branch block with left axis deviation Plan:   Problem List Items Addressed This Visit   None Visit Diagnoses        Encounter for follow-up surveillance of endometrial cancer    -  Primary     Other specified counseling         Leukoplakia of vulva           She has chronic lichen planus which causes recurrent vaginal agglutination.  Previously used the narrow vaginal dilator occasionally.    I did offer to do a vulvar biopsy today for diagnostic purposes however she declined.  We took a photograph to help with monitoring.  Will continue to follow closely and she will reach out to us  if her symptoms worsen.  Suggested return to clinic in 6 months. She declined alternating with Dr. Verdon. She is aware that if she is NED in five years we will refer her to Dr. Verdon for continued follow up.    The patient's diagnosis, an outline of the further diagnostic and laboratory studies which will be required, the recommendation for surgery, and alternatives were discussed with her.  All questions were answered to their satisfaction.  I personally interviewed and examined the patient. Agreed with the above/below plan of care. I have directly contributed to assessment and plan of care of this patient and educated and discussed with patient and family.   Micki Cassel Isidor Constable, MD      CC:   Verdon Keen, MD [address] [place],  [place] [address] [phone]

## 2023-01-30 ENCOUNTER — Encounter: Payer: Self-pay | Admitting: Physical Therapy

## 2023-01-30 ENCOUNTER — Ambulatory Visit: Payer: Medicare Other | Admitting: Physical Therapy

## 2023-01-30 DIAGNOSIS — R2681 Unsteadiness on feet: Secondary | ICD-10-CM | POA: Diagnosis not present

## 2023-01-30 DIAGNOSIS — M6281 Muscle weakness (generalized): Secondary | ICD-10-CM

## 2023-01-30 NOTE — Therapy (Signed)
 OUTPATIENT PHYSICAL THERAPY TREATMENT   Patient Name: Abigail Walker MRN: [id number] DOB:Feb 20, 1945, 78 y.o., female Today's Date: 01/30/2023  END OF SESSION:  PT End of Session - 01/30/23 1408     Visit Number 13    Number of Visits 25    Date for PT Re-Evaluation 02/13/23    Authorization Type eval: 12/19/22    PT Start Time 1411    PT Stop Time 1453    PT Time Calculation (min) 42 min    Activity Tolerance Patient tolerated treatment well    Behavior During Therapy [facility] for tasks assessed/performed               Past Medical History:  Diagnosis Date   Adult hypothyroidism 06/16/2013   Anemia    Aortic atherosclerosis (HCC)    Arthritis    Benign hypertension 06/16/2013   Carotid artery disease (HCC)    a.) carotid US  02/10/2020: 1-39% BICA; b.) carotid US  09/22/2021: 1-39% BICA   Carlin Abrahams syndrome    CKD (chronic kidney disease), stage III (HCC)    Coronary artery calcification seen on CT scan    Depression    Esophageal spasm    Esophageal stricture    GERD (gastroesophageal reflux disease)    HBV (hepatitis B virus) infection    High blood pressure    HLD (hyperlipidemia)    Hypothyroidism    LBBB (left bundle branch block)    Lichen planus    Long term current use of antithrombotics/antiplatelets    a.) clopidogrel    Mixed incontinence    OSA on CPAP 06/16/2013   Pneumonia 2023   RLS (restless legs syndrome)    Secondary hyperparathyroidism of renal origin (HCC)    Skin cancer, basal cell    Sleep apnea    Stroke (HCC)    Thyroid  disease    Type 2 diabetes mellitus treated with insulin  (HCC)    Past Surgical History:  Procedure Laterality Date   CATARACT EXTRACTION Bilateral    COLONOSCOPY  2009   COLONOSCOPY WITH PROPOFOL  N/A 11/19/2021   Procedure: COLONOSCOPY WITH PROPOFOL ;  Surgeon: Maryruth Ole ONEIDA, MD;  Location: [facility] ENDOSCOPY;  Service: Endoscopy;  Laterality: N/A;  DM   DILATATION & CURETTAGE/HYSTEROSCOPY WITH MYOSURE   10/14/2021   Procedure: MYOSURE POLYPECTOMY;  Surgeon: Verdon Keen, MD;  Location: [facility] ORS;  Service: Gynecology;;   ESOPHAGOGASTRODUODENOSCOPY     2009, 2011   ESOPHAGOGASTRODUODENOSCOPY (EGD) WITH PROPOFOL  N/A 06/08/2015   Procedure: ESOPHAGOGASTRODUODENOSCOPY (EGD) WITH PROPOFOL ;  Surgeon: Deward CINDERELLA Piedmont, MD;  Location: [facility] ENDOSCOPY;  Service: Gastroenterology;  Laterality: N/A;   ESOPHAGOGASTRODUODENOSCOPY (EGD) WITH PROPOFOL  N/A 11/19/2021   Procedure: ESOPHAGOGASTRODUODENOSCOPY (EGD) WITH PROPOFOL ;  Surgeon: Maryruth Ole ONEIDA, MD;  Location: [facility] ENDOSCOPY;  Service: Endoscopy;  Laterality: N/A;   HEMORRHOID SURGERY     HYSTEROSCOPY WITH D & C N/A 10/14/2021   Procedure: DILATATION AND CURETTAGE /HYSTEROSCOPY;  Surgeon: Verdon Keen, MD;  Location: [facility] ORS;  Service: Gynecology;  Laterality: N/A;   IMPLANTABLE CONTACT LENS IMPLANTATION     LYSIS OF ADHESION  10/14/2021   Procedure: LYSIS OF VAGINAL ADHESIONS;  Surgeon: Verdon Keen, MD;  Location: [facility] ORS;  Service: Gynecology;;   TONSILLECTOMY     TUBAL LIGATION     Patient Active Problem List   Diagnosis Date Noted   [employer] Gyne[employer] and Ob[employer] (FIGO) malignant neoplasm of endometrium stage IB (HCC) 01/19/2022   Status post surgery 10/14/2021   Aspiration pneumonia (HCC) 08/11/2021  CAP (community acquired pneumonia) 08/10/2021   Chest pain 08/10/2021   Dysphagia 08/10/2021   Type 2 diabetes mellitus with peripheral neuropathy (HCC) 08/10/2021   PAD (peripheral artery disease) (HCC) 02/22/2020   Hypoglycemia due to type 2 diabetes mellitus (HCC)    Hypothermia    Dehydration    Hypoglycemia 01/09/2020   Moderate protein-calorie malnutrition (HCC) 01/08/2020   Fall    Hyponatremia    Hyperglycemia 12/05/2019   Acute lower UTI 12/05/2019   Depression    Lumbar compression fracture (HCC)    Weakness of left foot 09/14/2019   Hyperlipidemia 08/13/2019   Hypercalcemia 05/01/2019    Leukopenia 01/17/2019   Stage 3 chronic kidney disease (HCC) 01/17/2019   Benign hypertensive kidney disease with chronic kidney disease 01/16/2019   Hyposmolality and/or hyponatremia 01/16/2019   Secondary hyperparathyroidism of renal origin (HCC) 01/16/2019   Carotid artery disease (HCC) 08/10/2017   Chronic midline low back pain without sciatica 03/16/2017   Primary osteoarthritis of both hands 01/16/2017   Benign essential tremor 08/12/2015   MGUS (monoclonal gammopathy of unknown significance) 07/08/2015   Mixed incontinence 11/09/2014   Atrophic vaginitis 07/28/2014   Encounter for general adult medical examination without abnormal findings 05/28/2014   Major depression in remission (HCC) 11/27/2013   Morbid obesity (HCC) 11/16/2013   Microalbuminuria 08/06/2013   Breath shortness 08/02/2013   Acid reflux 06/16/2013   Benign hypertension 06/16/2013   Type 2 diabetes mellitus (HCC) 06/16/2013   Hypothyroidism 06/16/2013   Obstructive apnea 06/16/2013   Hyperlipidemia associated with type 2 diabetes mellitus (HCC) 94/68/7984   Oral lichen planus 05/10/2011   Lichen planus 11/09/2010    PCP: Lenon Layman ORN, MD  REFERRING PROVIDER: Paich, Kaitlin, PA-C   REFERRING DIAG: R29.6 (ICD-10-CM) - Repeated falls   RATIONALE FOR EVALUATION AND TREATMENT: Rehabilitation  THERAPY DIAG: Unsteadiness on feet  Muscle weakness (generalized)  ONSET DATE: Approximately 2 years  FOLLOW-UP APPT SCHEDULED WITH REFERRING PROVIDER: Yes   PERTINENT HISTORY:  Pt referred by neurology for issues with imbalance and falls. She states that she has been having difficulty with her balance for approximately 2 years. She reports dragging her feet occasionally when she walks. She has had 3 falls in the last 6 months but denies any significant injuries except for skin lacerations. She has a history of small infarcts involving right occipital lobe with left homonymous hemianopsia. Pt actually  reports she is lacking midline vision bilaterally but per chart has poor insight into her deficits. No new neurologic symptoms recently. She has a medical history significant for diabetes mellitus with complications of stage III chronic kidney disease, depression, hypothyroidism, hypertension, cognitive changes, recurrent falls and GERD. Also has a history of endometrial adenocarcinoma, s/p hysterectomy and bilateral SPO, not on chemotherapy or radiation secondary to lichen planus.  MRI brain with and without contrast 01/02/2020: No acute intracranial abnormality. Moderate parenchymal volume loss and chronic microvascular ischemic white matter disease. No acute fracture or traumatic listhesis of the cervical spine. Multilevel degenerative changes of the cervical spine as described above. Cervical and intracranial atherosclerosis.   MRI brain 11/27/22 without results yet  CT Cervical Spine without contrast 12/05/2019: No acute fracture or traumatic listhesis of the cervical spine. Multilevel degenerative changes of the cervical spine as described above. Cervical and intracranial atherosclerosis. Emphysema (ICD10-J43.9).   CT thoracic Spine without contrast 12/05/2019: L1 compression deformity, better detailed dedicated lumbar imaging. Slightly sclerotic endplate change and central cupping in the inferior endplate T12 with a rounded lucency  and extension of nitrogenous gas from the disc space is favored to reflect an acute to subacute Schmorl's node formation rather than true compression deformity. No other acute thoracic spine fracture or vertebral body height loss. Multilevel degenerative changes of the thoracic spine without significant spinal canal stenosis or neural foraminal narrowing within the thoracic levels. 3 mm solid nodule in the anterosuperior segment right upper lobe.   Pain: Yes, arthritis pain in spine Numbness/Tingling: Yes, bilateral LE neuropathy, some bilateral hand tingling  Focal  Weakness: Yes, L sided weakness per subjective report Recent changes in overall health/medication: Yes, worsening kidney function. Recently started Jardiance.  Prior history of physical therapy for balance:  No Dominant hand: left, uses both hands Imaging: Yes, see above  PRECAUTIONS: Fall  WEIGHT BEARING RESTRICTIONS: No  FALLS: Has patient fallen in last 6 months? Yes. Number of falls 3 , Directional pattern for falls: Yes to the left  Living Environment Lives with: lives alone Lives in: House/apartment, double wide mobile home Stairs: single leg home, ramp on front to enter, 8 steps on back with bilateral rails that she can reach simulataneously Has following equipment at home: Single point cane, Walker - 2 wheeled, Environmental Consultant - 4 wheeled, and Wheelchair (manual) Tub shower (no grab bars), shower seat  Prior level of function: Independent  Occupational demands: Retired  Presenter, Broadcasting: Social Research Officer, Government, watching sports  Patient Goals: Improve balance. I need to learn how to slow down.     OBJECTIVE (data from initial evaluation unless otherwise dated):   Patient Surveys  FOTO: 74, predicted improvement to 60 ABC: 77.5%   Posture: Forward head with upper thoracic posture   LE MMT: MMT (out of 5) Right  Left   Hip flexion 4 4  Hip extension    Hip abduction (seated) 4 4  Hip adduction (setaed) 4 4  Hip internal rotation    Hip external rotation    Knee flexion (seated) 5 5  Knee extension 5 5  Ankle dorsiflexion 5 5  Ankle plantarflexion    (* = pain; Blank rows = not tested)  UE strength testing WNL and symmetrical  Transfers: Assistive device utilized: None  Sit to stand: Complete Independence Stand to sit: Complete Independence Chair to chair: Complete Independence Floor:  Not performed  Curb:  Deferred  Stairs: Level of Assistance: Modified independence Stair Negotiation Technique: Step to Pattern with Single Rail on Right Number of Stairs: 4  Height of  Stairs: 6  Comments: Reciprocal ascending and step-to descending  Gait: Gait pattern: decreased step length- Right and decreased step length- Left Distance walked: 100' Assistive device utilized: None Level of assistance: Complete Independence Comments: Decreased self-selected speed  Clinical Test of Sensory Interaction for Balance (CTSIB): CONDITION TIME  Eyes open, firm surface 30 seconds  Eyes closed, firm surface 7 seconds  Eyes open, foam surface 30 seconds  Eyes closed, foam surface 1.8 seconds    Functional Outcome Measures  Results Comments  BERG 51/56 Mild balance deficits  FGA 17/30   TUG 8.3 seconds WNL  5TSTS 11.0 seconds WNL  10 Meter Gait Speed Self-selected: 16.8s = 0.60 m/s; Fastest: 8.5s = 1.18 m/s Decreased self selected gait speed  MiniBESTest 17/28    (Blank rows = not tested)     TODAY'S TREATMENT     SUBJECTIVE STATEMENT:   Patient reports ongoing coccygeal pain. She had episodes of coccydynia at young age as well. Pt reports low hemoglobin from recent follow-up with nephrologist. Patient reports no further episodes  of dizziness/lightheadedness.     Therapeutic Exercise - improved strength as needed to improve performance of CKC activities/functional movements and as needed for power production to prevent fall during episode of large postural perturbation   NuStep; Level 5; x 5 minutes - for improved soft tissue mobility and increased tissue temperature to improve muscle performance   -subjective gathered during portion of this time.   PATIENT EDUCATION: Discussed current condition relating to CKD and low hemoglobin.    *not today* Consecutive lateral step-over, (3) 6-inch hurdles, in // bars; 5x D/B 3-way kick; 1x8 with bilat LE Minisquat, in // bars; 1x10  -for HEP review   Neuromuscular Re-education - for improved sensory integration, static and dynamic postural control, equilibrium and non-equilibrium coordination as needed for  negotiating home and community environment and stepping over obstacles  SpO2 (post-exertion):  93-98%   In // bars: dynamic march, 5-lb ankle weights; 5x D/B  -PT SBA  Standing on Airex with perturbations, multi-directional; 2 x 1-minute  Tandem stance, in // bars; multiple attempts with up to 20 sec attained; performed with RLE in back and LLE in back   Multi-directional stepping on blue star (5 directions; anterior, anterolateral R/L, lateral R/L); x5 ea dir  Consecutive forward step-over; (3) 6-inch hurdles, (1) Airex pad step up/down, and long Airex tandem walk; in hallway; 3x D/B  -careful CGA during Airex tandem walk, difficulty maintaining Tandem walk   *modified to walking across long pad without heel-to-toe/Tandem walk  -intermittent lateral LOB with Airex tandem walk, pt able to perform crossover step and re-gain postural control without notable assist from PT  -will consider modifying Airex Tandem walk to standard tandem walk or other dynamic balance drill next visit   *next visit* Plyoboard forward toss with 4.4-lb ball; standing on Airex; 2 x 1-minute bouts    *not today* Toe tapping; 12-inch step; pt standing on Airex; 1x10 alternating Gait in 1/2 of 70-ft hallway with ball pass for dual tasking; forward to retro stepping; 3x D/B Gait with verbal cueing (go, slow, stop and turn); x 5 minutes Alternating lateral step-over, 6-inch hurdle; 2x10 alternating R/L Toe tapping; 6-inch step, 1x10 alternating Forward step-over (then return to bilateral standing with target in front), 5-lb ankle weight; 1x10 with each LE Standing feet together, eyes closed; 1 x 30 sec Standing on Airex; feet together; x 30 seconds Gait down 70-ft hallway with scanning for visual targets on wall; identifying letters; x2 lengths of hall     PATIENT EDUCATION:  Education details: see above for patient education details Person educated: Patient Education method: Explanation Education  comprehension: verbalized understanding   HOME EXERCISE PROGRAM:  Access Code: [email or web address] URL: [email or web address] Date: 01/02/2023 Prepared by: Venetia Endo  Exercises - Forward Step Over with Counter Support  - 2 x daily - 7 x weekly - 2 sets - 10 reps - Standing 3-Way Kick  - 2 x daily - 7 x weekly - 2 sets - 10 reps - Mini Squat with Counter Support  - 2 x daily - 7 x weekly - 2 sets - 10 reps - Standing Forward Toe Taps on Box (BKA)  - 2 x daily - 7 x weekly - 2 sets - 10 reps   ASSESSMENT:  CLINICAL IMPRESSION: Pt does have improved coccygeal pain with general movement/exercise and tolerates most drills well today. She has intermittent pain with corrective stepping to re-gain balance to to ground reaction force. Pt has oxygen intermittently in low 90s, but never <89%. Pt  is continuing follow-up with specialist regarding low hemoglobin. She is making good progress with balance and gait stability. She will benefit from further work on postural control, dynamic and reactive balance, dual task training, and obstacle negotiation/toe clearance drills to improve falls risk and performance in testing. Pt will continue to benefit from skilled PT services to address deficits and improve function.   OBJECTIVE IMPAIRMENTS: decreased balance, decreased cognition, and decreased strength.   ACTIVITY LIMITATIONS: squatting, stairs, and transfers  PARTICIPATION LIMITATIONS: cleaning, laundry, shopping, and community activity  PERSONAL FACTORS: Age, Past/current experiences, Time since onset of injury/illness/exacerbation, and 3+ comorbidities: CVA, visual deficits, cognitive deficits, hearing impairment, DM, and CKD  are also affecting patient's functional outcome.   REHAB POTENTIAL: Fair    CLINICAL DECISION MAKING: Unstable/unpredictable  EVALUATION COMPLEXITY: High   GOALS: Goals reviewed with patient? No  SHORT TERM GOALS: Target date: 01/30/2023  Pt will be  independent with HEP in order to improve strength and balance in order to decrease fall risk and improve function at home. Baseline:  12/21/22: Baseline HEP initiated.    01/19/23: Pt reports completing exercises usually 2x/day, first bout may be late AM.  Goal status: ACHIEVED   LONG TERM GOALS: Target date: 03/13/2023  Pt will increase FOTO to at least 60 to demonstrate significant improvement in function at home related to balance  Baseline: 12/19/22: 49/60  01/19/23: 67/60 Goal status: ACHIEVED  2.  Pt will improve BERG by at least 3 points in order to demonstrate clinically significant improvement in balance.   Baseline: 12/19/22: 51/56  01/23/23: 53/56 Goal status: IN PROGRESS  3.  Pt will improve mini-BESTest by at least 4 points in order to demonstrate clinically significant improvement in balance and decreased risk for falls       Baseline: 12/19/22: To be completed next visit.    12/21/22: 17/28  01/19/23: 20/28 Goal status: IN PROGRESS  4. Pt will increase self-selected 27m gait speed to at least 0.80 m/s in order to demonstrate clinically significant improvement in community ambulation.   Baseline: 12/19/22: Self-selected: 16.8s = 0.60 m/s    01/19/23: Self-selected: 16.8s = 0.87 m/s Goal status: ACHIEVED  5.  Pt will improve FGA by at least 5 points in order to demonstrate clinically significant improvement in balance and decreased risk for falls       Baseline: 12/26/22: 17/30  01/19/23: 21/30 Goal status: IN PROGRESS    PLAN: PT FREQUENCY: 2x/week  PT DURATION: 4-6 weeks  PLANNED INTERVENTIONS: Therapeutic exercises, Therapeutic activity, Neuromuscular re-education, Balance training, Gait training, Patient/Family education, Self Care, Joint mobilization, Joint manipulation, Vestibular training, Canalith repositioning, Orthotic/Fit training, DME instructions, Dry Needling, Electrical stimulation, Spinal manipulation, Spinal mobilization, Cryotherapy, Moist heat, Taping,  Traction, Ultrasound, Ionotophoresis 4mg /ml Dexamethasone , Manual therapy, and Re-evaluation.  PLAN FOR NEXT SESSION: Continue with postural control training, integrating eyes-closed tasks. Continue with LE strengthening and obstacle negotiation work; strategies for foot clearance and decreased shuffling.     Venetia Endo, PT, DPT #[id number]  Venetia ONEIDA Endo 01/30/2023, 2:09 PM

## 2023-02-01 ENCOUNTER — Encounter: Payer: Medicare Other | Admitting: Physical Therapy

## 2023-02-06 ENCOUNTER — Encounter: Payer: Medicare Other | Admitting: Physical Therapy

## 2023-02-08 ENCOUNTER — Encounter: Payer: Medicare Other | Admitting: Physical Therapy

## 2023-02-17 NOTE — Progress Notes (Signed)
 [facility] MD/PA/NP OP Progress Note  02/21/2023 4:37 PM Abigail Walker  MRN:  [id number]  Chief Complaint:  Chief Complaint  Patient presents with   Follow-up   HPI:  This is a follow-up appointment for depression, anxiety.  She states that she is not doing well.  The house is falling apart, she is falling apart, and the country is falling apart.  She states that she is not able to get Jardiance anymore as she does not qualify.  She is concerned that her kidney is going down .  Her blood sugar control is not well.  There are issues with windows and doors, which need to be replaced.  She does not care anymore, although she denies SI.  She also talks about leg pain. Although she was making significant progress with physical therapy, she will not be able to continue due to financial strain and an increase in payment. While reviewing her medication, she states that she has not been taking bupropion  for the past few weeks.  She was informed that the medication refill should be at the pharmacy, and she agrees to contact the office if she has any issues with refilling the medication.  She sleeps well.  She denies SI.  She reports worsening in anxiety.  She did not think therapy was helpful, and is not interested in this at this time.  She agrees with the plan as outlined below.   Wt Readings from Last 3 Encounters:  02/21/23 127 lb 3.2 oz (57.7 kg)  01/25/23 122 lb 8 oz (55.6 kg)  12/29/22 126 lb 12.8 oz (57.5 kg)     Substance use   Tobacco Alcohol Other substances/  Current denies denies denies  Past Smoked 20 years in the past, last in 1990's Denies (hated alcohol due to her mother's history of alcohol use) denies  Past Treatment            Support: Household: by herself Marital status: divorced after ten years of marriage,  until 1979 Number of children: 1 daughter (deceased from cancer. She used to have issues with drug).  She was unable to see her grandchildren for 10 years since the loss of her  daughter, although she reconnected recently Employment: retired, public librarian in the past Education: High school. Received six months of nurse's training at age 3 She states that her parents did not pay attention to her.  She never had hugs, kisses except from her maternal grandmother.  Her mother abused alcohol, and she raised myself.  Visit Diagnosis:    ICD-10-CM   1. MDD (major depressive disorder), recurrent episode, moderate (HCC)  F33.1     2. Anxiety state  F41.1       Past Psychiatric History: Please see initial evaluation for full details. I have reviewed the history. No updates at this time.     Past Medical History:  Past Medical History:  Diagnosis Date   Adult hypothyroidism 06/16/2013   Anemia    Aortic atherosclerosis (HCC)    Arthritis    Benign hypertension 06/16/2013   Carotid artery disease (HCC)    a.) carotid US  02/10/2020: 1-39% BICA; b.) carotid US  09/22/2021: 1-39% BICA   Carlin Abrahams syndrome    CKD (chronic kidney disease), stage III (HCC)    Coronary artery calcification seen on CT scan    Depression    Esophageal spasm    Esophageal stricture    GERD (gastroesophageal reflux disease)    HBV (hepatitis B virus) infection  High blood pressure    HLD (hyperlipidemia)    Hypothyroidism    LBBB (left bundle branch block)    Lichen planus    Long term current use of antithrombotics/antiplatelets    a.) clopidogrel    Mixed incontinence    OSA on CPAP 06/16/2013   Pneumonia 2023   RLS (restless legs syndrome)    Secondary hyperparathyroidism of renal origin (HCC)    Skin cancer, basal cell    Sleep apnea    Stroke (HCC)    Thyroid  disease    Type 2 diabetes mellitus treated with insulin  [facility])     Past Surgical History:  Procedure Laterality Date   CATARACT EXTRACTION Bilateral    COLONOSCOPY  2009   COLONOSCOPY WITH PROPOFOL  N/A 11/19/2021   Procedure: COLONOSCOPY WITH PROPOFOL ;  Surgeon: Maryruth Ole DASEN, MD;  Location:  [facility] ENDOSCOPY;  Service: Endoscopy;  Laterality: N/A;  DM   DILATATION & CURETTAGE/HYSTEROSCOPY WITH MYOSURE  10/14/2021   Procedure: MYOSURE POLYPECTOMY;  Surgeon: Verdon Keen, MD;  Location: [facility] ORS;  Service: Gynecology;;   ESOPHAGOGASTRODUODENOSCOPY     2009, 2011   ESOPHAGOGASTRODUODENOSCOPY (EGD) WITH PROPOFOL  N/A 06/08/2015   Procedure: ESOPHAGOGASTRODUODENOSCOPY (EGD) WITH PROPOFOL ;  Surgeon: Deward CINDERELLA Piedmont, MD;  Location: [facility] ENDOSCOPY;  Service: Gastroenterology;  Laterality: N/A;   ESOPHAGOGASTRODUODENOSCOPY (EGD) WITH PROPOFOL  N/A 11/19/2021   Procedure: ESOPHAGOGASTRODUODENOSCOPY (EGD) WITH PROPOFOL ;  Surgeon: Maryruth Ole DASEN, MD;  Location: [facility] ENDOSCOPY;  Service: Endoscopy;  Laterality: N/A;   HEMORRHOID SURGERY     HYSTEROSCOPY WITH D & C N/A 10/14/2021   Procedure: DILATATION AND CURETTAGE /HYSTEROSCOPY;  Surgeon: Verdon Keen, MD;  Location: [facility] ORS;  Service: Gynecology;  Laterality: N/A;   IMPLANTABLE CONTACT LENS IMPLANTATION     LYSIS OF ADHESION  10/14/2021   Procedure: LYSIS OF VAGINAL ADHESIONS;  Surgeon: Verdon Keen, MD;  Location: [facility] ORS;  Service: Gynecology;;   TONSILLECTOMY     TUBAL LIGATION      Family Psychiatric History: Please see initial evaluation for full details. I have reviewed the history. No updates at this time.     Family History:  Family History  Problem Relation Age of Onset   Alcohol abuse Mother    Cancer Mother    Heart disease Father    Kidney failure Maternal Aunt     Social History:  Social History   Socioeconomic History   Marital status: Divorced    Spouse name: Not on file   Number of children: Not on file   Years of education: Not on file   Highest education level: Some college, no degree  Occupational History   Not on file  Tobacco Use   Smoking status: Former    Current packs/day: 0.00    Types: Cigarettes    Quit date: 07/27/1993    Years since quitting: 29.5   Smokeless tobacco: Never    Tobacco comments:    quit 20 years  Vaping Use   Vaping status: Never Used  Substance and Sexual Activity   Alcohol use: No    Alcohol/week: 0.0 standard drinks of alcohol   Drug use: No   Sexual activity: Not Currently    Birth control/protection: Post-menopausal  Other Topics Concern   Not on file  Social History Narrative   Lives alone   Social Drivers of Health   Financial Resource Strain: Medium Risk (06/07/2022)   Received from [facility], [facility]   Overall Financial Resource Strain (CARDIA)  Difficulty of Paying Living Expenses: Somewhat hard  Food Insecurity: No Food Insecurity (06/07/2022)   Received from [facility], [facility]   Hunger Vital Sign    Worried About Running Out of Food in the Last Year: Never true    Ran Out of Food in the Last Year: Never true  Transportation Needs: No Transportation Needs (06/07/2022)   Received from [facility], [facility]   [facility] - Transportation    In the past 12 months, has lack of transportation kept you from medical appointments or from getting medications?: No    Lack of Transportation (Non-Medical): No  Physical Activity: Inactive (12/01/2021)   Received from [facility], [facility]   Exercise Vital Sign    Days of Exercise per Week: 0 days    Minutes of Exercise per Session: 0 min  Stress: Not on file  Social Connections: Socially Isolated (12/01/2021)   Received from [facility], [facility]   Social Connection and Isolation Panel [NHANES]    Frequency of Communication with Friends and Family: Never    Frequency of Social Gatherings with Friends and Family: Never    Attends Religious Services: Never    Database Administrator or Organizations: No    Attends Banker Meetings: Never    Marital Status: Divorced     Allergies:  Allergies  Allergen Reactions   Amoxicillin -Pot Clavulanate Rash   Metformin And Related Nausea And Vomiting   Sulfa Antibiotics Nausea And Vomiting   Hydrocortisone-Iodoquinol  [Hydrocortisone-Iodoquinol] Rash    Patient reports that she does not believe that she has allergy to this, that she was receiving many medications at once.    Metabolic Disorder Labs: Lab Results  Component Value Date   HGBA1C 6.6 (H) 10/27/2021   MPG 142.72 10/27/2021   MPG 145.59 08/11/2021   No results found for: PROLACTIN No results found for: CHOL, TRIG, HDL, CHOLHDL, VLDL, LDLCALC Lab Results  Component Value Date   TSH 0.061 (L) 10/27/2021   TSH 11.315 (H) 01/09/2020    Therapeutic Level Labs: No results found for: LITHIUM No results found for: VALPROATE No results found for: CBMZ  Current Medications: Current Outpatient Medications  Medication Sig Dispense Refill   acetaminophen  (TYLENOL ) 500 MG tablet Take 500 mg by mouth every 6 (six) hours as needed for moderate pain.     buPROPion  (WELLBUTRIN  XL) 150 MG 24 hr tablet Take 3 tablets (450 mg total) by mouth daily. 270 tablet 0   Cholecalciferol  (VITAMIN D3) 50 MCG (2000 UT) TABS Take 2 tablets by mouth daily.     clobetasol  ointment (TEMOVATE ) 0.05 % APPLY ONE APPLICATION TOPICALLY DAILY. VAGINA 60 g 0   clopidogrel  (PLAVIX ) 75 MG tablet Take 1 tablet (75 mg total) by mouth at bedtime. 30 tablet 0   cyanocobalamin (VITAMIN B12) 1000 MCG tablet Take 1,000 mcg by mouth daily.     docusate sodium  (COLACE) 100 MG capsule Take 1 capsule (100 mg total) by mouth daily. 30 capsule 0   empagliflozin (JARDIANCE) 25 MG TABS tablet Take by mouth.     ferrous sulfate  325 (65 FE) MG tablet Take 1 tablet (325 mg total) by mouth daily with breakfast. (Patient taking differently: Take 325 mg by mouth daily.) 30 tablet 0   insulin  glargine (LANTUS ) 100 UNIT/ML injection Inject 0.15 mLs (15 Units total) into the skin at  bedtime. (Patient taking differently: Inject  15 Units into the skin daily. morning) 10 mL 0   Insulin  Syringe-Needle U-100 (INSULIN  SYRINGE 1CC/31GX5/16) 31G X 5/16 1 ML MISC USE ONE SYRINGE TWICE DAILY AS DIRECTED WITH INSULIN  VIALS     levothyroxine  (SYNTHROID ) 75 MCG tablet Take 1 tablet (75 mcg total) by mouth daily before breakfast. (Patient taking differently: Take 20 mcg by mouth daily before breakfast.) 30 tablet 0   lidocaine -prilocaine  (EMLA ) cream Apply 1 Application topically as needed. To your vulva for pain control 30 g 0   lisinopril  (ZESTRIL ) 20 MG tablet Take 10 mg by mouth daily.     NOVOLOG  100 UNIT/ML injection Inject 6 Units into the skin in the morning and at bedtime. (Patient taking differently: Inject 4-6 Units into the skin in the morning and at bedtime. 4 units in the morning and 6 units at bedtime) 10 mL 0   nystatin cream (MYCOSTATIN) Apply 1 application topically as needed (yeast).     rosuvastatin  (CRESTOR ) 40 MG tablet Take 1 tablet (40 mg total) by mouth at bedtime. 90 tablet 0   torsemide  (DEMADEX ) 10 MG tablet Take 1 tablet (10 mg total) by mouth every other day. 30 tablet 0   [START ON 02/25/2023] PARoxetine  (PAXIL ) 20 MG tablet Take 1 tablet (20 mg total) by mouth daily. 90 tablet 0   No current facility-administered medications for this visit.     Musculoskeletal: Strength & Muscle Tone: within normal limits Gait & Station: normal Patient leans: N/A  Psychiatric Specialty Exam: Review of Systems  Psychiatric/Behavioral:  Positive for dysphoric mood. Negative for agitation, behavioral problems, confusion, decreased concentration, hallucinations, self-injury, sleep disturbance and suicidal ideas. The patient is nervous/anxious. The patient is not hyperactive.   All other systems reviewed and are negative.   Blood pressure 122/80, pulse (!) 103, temperature 98.7 F (37.1 C), temperature source Temporal, height 5' (1.524 m), weight 127 lb 3.2 oz (57.7 kg),  SpO2 98%.Body mass index is 24.84 kg/m.  General Appearance: Well Groomed  Eye Contact:  Good  Speech:  Clear and Coherent  Volume:  Normal  Mood:  Depressed  Affect:  Appropriate, Congruent, and slight tense  Thought Process:  Coherent  Orientation:  Full (Time, Place, and Person)  Thought Content: Logical   Suicidal Thoughts:  No  Homicidal Thoughts:  No  Memory:  Immediate;   Good  Judgement:  Good  Insight:  Good  Psychomotor Activity:  Normal  Concentration:  Concentration: Good and Attention Span: Good  Recall:  Good  Fund of Knowledge: Good  Language: Good  Akathisia:  No  Handed:  Right  AIMS (if indicated): not done  Assets:  Communication Skills Desire for Improvement  ADL's:  Intact  Cognition: WNL  Sleep:  Good   Screenings: PHQ2-9    Flowsheet Row [facility] Visit [facility]/20[facility] in [facility]  PHQ-2 Total Score 1  PHQ-9 Total Score 5      Flowsheet Row ED from 10/30/2022 in [facility] at [facility] (Discharged) from 10/14/2021 in [facility] from 08/10/2021 in [facility]  C-SSRS RISK CATEGORY No Risk No Risk No Risk        Assessment and Plan:  Abigail Walker is a 78 y.o. year old female with a history of depression, endometrial adenocarcinoma s/p hysterectomy and bilateral SPO, not on chemotherapy or radiation secondary to lichen planus, right occipital infarcts in 2021, cognitive improvements, type  II diabetes, hypertension, hyperlipidemia, LBBB, who is referred for depression.   1. MDD (major depressive disorder), recurrent episode, moderate (HCC) 2. Anxiety state Acute stressors include: financial strain in the setting of buying a car, loss of her close friend  Other stressors include: absence of nurturing except her maternal grandfather (her mother had alcohol use issues) History: depression  since child, originally on Paxil  20 mg daily, bupropion  450 mg daily    Exam is notable for rumination over her medical condition, and she reports worsening in depressive symptoms in the context of accidentally running out of bupropion  and medication-induced hyperthyroidism.  Will restart bupropion  to target depressive symptoms, along with Paxil  to target depression and anxiety.  Although she will greatly benefit from CBT, she is not interested in this due to the past experience.   2. Neurocognitive deficits # Hallucinations Functional Status   IADL: Independent in the following: managing finances, medications, driving, cooking (only occasionally)           Requires assistance with the following:  ADL  Independent in the following: bathing and hygiene, feeding, continence, grooming and toileting, walking          Requires assistance with the following: Folate, Vitamin B12 (wnl 11/2022), TSH wnl 04/2022 Images Brain: Chronic white matter ischemic change. No evidence of acute infarction, hemorrhage, hydrocephalus, extra-axial collection or mass lesion/mass effect. Neuropsych assessment: SLUMS 27 on 11/2021, MOCA 22/30 09/2022 Etiology: VaD    IADL is independent, although she does have some validity deficits as evidenced by MoCA. She has a history of chronic hallucinations, likely secondary to NPS, and she denies significant concern about this.  Will not start antipsychotics at this time given its risk outweighs benefit.   Plan  Continue Paxil  20 mg daily Restart bupropion  450 mg daily   Next appointment: 4/1 at 4 pm for 30 mins, IP   The patient demonstrates the following risk factors for suicide: Chronic risk factors for suicide include: psychiatric disorder of depression . Acute risk factors for suicide include: unemployment and loss (financial, interpersonal, professional). Protective factors for this patient include: coping skills and hope for the future. Considering these factors, the overall  suicide risk at this point appears to be low. Patient is appropriate for outpatient follow up.     Collaboration of Care: Collaboration of Care: Other reviewed notes in Epic  Patient/Guardian was advised Release of Information must be obtained prior to any record release in order to collaborate their care with an outside provider. Patient/Guardian was advised if they have not already done so to contact the registration department to sign all necessary forms in order for us  to release information regarding their care.   Consent: Patient/Guardian gives verbal consent for treatment and assignment of benefits for services provided during this visit. Patient/Guardian expressed understanding and agreed to proceed.    Katheren Sleet, MD 02/21/2023, 4:37 PM

## 2023-02-21 ENCOUNTER — Encounter: Payer: Self-pay | Admitting: Psychiatry

## 2023-02-21 ENCOUNTER — Ambulatory Visit (INDEPENDENT_AMBULATORY_CARE_PROVIDER_SITE_OTHER): Payer: Medicare Other | Admitting: Psychiatry

## 2023-02-21 VITALS — BP 122/80 | HR 103 | Temp 98.7°F | Ht 60.0 in | Wt 127.2 lb

## 2023-02-21 DIAGNOSIS — R419 Unspecified symptoms and signs involving cognitive functions and awareness: Secondary | ICD-10-CM

## 2023-02-21 DIAGNOSIS — F331 Major depressive disorder, recurrent, moderate: Secondary | ICD-10-CM

## 2023-02-21 DIAGNOSIS — F411 Generalized anxiety disorder: Secondary | ICD-10-CM

## 2023-02-21 MED ORDER — PAROXETINE HCL 20 MG PO TABS: 20.0000 mg | ORAL_TABLET | Freq: Every day | ORAL | 0 refills | Status: DC

## 2023-02-21 NOTE — Patient Instructions (Signed)
Continue Paxil 20 mg daily Restart bupropion 450 mg daily   Next appointment: 4/1 at 4 pm

## 2023-04-10 ENCOUNTER — Encounter: Payer: Self-pay | Admitting: Occupational Therapy

## 2023-04-10 ENCOUNTER — Ambulatory Visit: Attending: Orthopedic Surgery | Admitting: Occupational Therapy

## 2023-04-10 DIAGNOSIS — M65341 Trigger finger, right ring finger: Secondary | ICD-10-CM

## 2023-04-10 DIAGNOSIS — M65342 Trigger finger, left ring finger: Secondary | ICD-10-CM | POA: Diagnosis present

## 2023-04-10 DIAGNOSIS — M65331 Trigger finger, right middle finger: Secondary | ICD-10-CM | POA: Diagnosis present

## 2023-04-10 DIAGNOSIS — M65332 Trigger finger, left middle finger: Secondary | ICD-10-CM | POA: Insufficient documentation

## 2023-04-10 NOTE — Therapy (Signed)
 OUTPATIENT OCCUPATIONAL THERAPY ORTHO EVALUATION  Patient Name: Abigail Walker MRN: [id number] DOB:[birth year], 78 y.o., female Today's Date: 04/10/2023  PCP: Dr Delano Metz PROVIDER: Dr Rosita Kea  END OF SESSION:  OT End of Session - 04/10/23 1804     Visit Number 1    Number of Visits 8    Date for OT Re-Evaluation 05/22/23    OT Start Time 1440    OT Stop Time 1520    OT Time Calculation (min) 40 min    Activity Tolerance Patient tolerated treatment well    Behavior During Therapy [facility] for tasks assessed/performed             Past Medical History:  Diagnosis Date   Adult hypothyroidism 06/16/2013   Anemia    Aortic atherosclerosis (HCC)    Arthritis    Benign hypertension 06/16/2013   Carotid artery disease (HCC)    a.) carotid US 02/10/2020: 1-39% BICA; b.) carotid US 09/22/2021: 1-39% BICA   Vester Titsworth Ralphs syndrome    CKD (chronic kidney disease), stage III (HCC)    Coronary artery calcification seen on CT scan    Depression    Esophageal spasm    Esophageal stricture    GERD (gastroesophageal reflux disease)    HBV (hepatitis B virus) infection    High blood pressure    HLD (hyperlipidemia)    Hypothyroidism    LBBB (left bundle branch block)    Lichen planus    Long term current use of antithrombotics/antiplatelets    a.) clopidogrel   Mixed incontinence    OSA on CPAP 06/16/2013   Pneumonia 2023   RLS (restless legs syndrome)    Secondary hyperparathyroidism of renal origin (HCC)    Skin cancer, basal cell    Sleep apnea    Stroke (HCC)    Thyroid disease    Type 2 diabetes mellitus treated with insulin (HCC)    Past Surgical History:  Procedure Laterality Date   CATARACT EXTRACTION Bilateral    COLONOSCOPY  2009   COLONOSCOPY WITH PROPOFOL N/A 11/19/2021   Procedure: COLONOSCOPY WITH PROPOFOL;  Surgeon: Regis Bill, MD;  Location: [facility] ENDOSCOPY;  Service: Endoscopy;  Laterality: N/A;  DM   DILATATION & CURETTAGE/HYSTEROSCOPY WITH  MYOSURE  10/14/2021   Procedure: MYOSURE POLYPECTOMY;  Surgeon: Christeen Douglas, MD;  Location: [facility] ORS;  Service: Gynecology;;   ESOPHAGOGASTRODUODENOSCOPY     2009, 2011   ESOPHAGOGASTRODUODENOSCOPY (EGD) WITH PROPOFOL N/A 06/08/2015   Procedure: ESOPHAGOGASTRODUODENOSCOPY (EGD) WITH PROPOFOL;  Surgeon: Wallace Cullens, MD;  Location: [facility] ENDOSCOPY;  Service: Gastroenterology;  Laterality: N/A;   ESOPHAGOGASTRODUODENOSCOPY (EGD) WITH PROPOFOL N/A 11/19/2021   Procedure: ESOPHAGOGASTRODUODENOSCOPY (EGD) WITH PROPOFOL;  Surgeon: Regis Bill, MD;  Location: [facility] ENDOSCOPY;  Service: Endoscopy;  Laterality: N/A;   HEMORRHOID SURGERY     HYSTEROSCOPY WITH D & C N/A 10/14/2021   Procedure: DILATATION AND CURETTAGE /HYSTEROSCOPY;  Surgeon: Christeen Douglas, MD;  Location: [facility] ORS;  Service: Gynecology;  Laterality: N/A;   IMPLANTABLE CONTACT LENS IMPLANTATION     LYSIS OF ADHESION  10/14/2021   Procedure: LYSIS OF VAGINAL ADHESIONS;  Surgeon: Christeen Douglas, MD;  Location: [facility] ORS;  Service: Gynecology;;   TONSILLECTOMY     TUBAL LIGATION     Patient Active Problem List   Diagnosis Date Noted   [employer] Gyne[employer] and Ob[employer]etric[employer] (FIGO) malignant neoplasm of endometrium stage IB (HCC) 01/19/2022   Status post surgery 10/14/2021   Aspiration pneumonia (HCC) 08/11/2021  CAP (community acquired pneumonia) 08/10/2021   Chest pain 08/10/2021   Dysphagia 08/10/2021   Type 2 diabetes mellitus with peripheral neuropathy (HCC) 08/10/2021   PAD (peripheral artery disease) (HCC) 02/22/2020   Hypoglycemia due to type 2 diabetes mellitus (HCC)    Hypothermia    Dehydration    Hypoglycemia 01/09/2020   Moderate protein-calorie malnutrition (HCC) 01/08/2020   Fall    Hyponatremia    Hyperglycemia 12/05/2019   Acute lower UTI 12/05/2019   Depression    Lumbar compression fracture (HCC)    Weakness of left foot 09/14/2019   Hyperlipidemia 08/13/2019   Hypercalcemia  05/01/2019   Leukopenia 01/17/2019   Stage 3 chronic kidney disease (HCC) 01/17/2019   Benign hypertensive kidney disease with chronic kidney disease 01/16/2019   Hyposmolality and/or hyponatremia 01/16/2019   Secondary hyperparathyroidism of renal origin (HCC) 01/16/2019   Carotid artery disease (HCC) 08/10/2017   Chronic midline low back pain without sciatica 03/16/2017   Primary osteoarthritis of both hands 01/16/2017   Benign essential tremor 08/12/2015   MGUS (monoclonal gammopathy of unknown significance) 07/08/2015   Mixed incontinence 11/09/2014   Atrophic vaginitis 07/28/2014   Encounter for general adult medical examination without abnormal findings 05/28/2014   Major depression in remission (HCC) 11/27/2013   Morbid obesity (HCC) 11/16/2013   Microalbuminuria 08/06/2013   Breath shortness 08/02/2013   Acid reflux 06/16/2013   Benign hypertension 06/16/2013   Type 2 diabetes mellitus (HCC) 06/16/2013   Hypothyroidism 06/16/2013   Obstructive apnea 06/16/2013   Hyperlipidemia associated with type 2 diabetes mellitus (HCC) 16/10/9602   Oral lichen planus 05/10/2011   Lichen planus 11/09/2010    ONSET DATE: 6 months ago  REFERRING DIAG: Bilateral trigger fingers several digits  THERAPY DIAG:  Acquired trigger finger of both ring fingers  Acquired trigger finger of both middle fingers  Rationale for Evaluation and Treatment: Rehabilitation  SUBJECTIVE:   SUBJECTIVE STATEMENT: My trigger fingers probably started about 6 months ago.  My right hand is worse than the left.  MR ring fingers is worse than my middle fingers. Pt accompanied by: self  PERTINENT HISTORY: DR Rosita Kea visit note 03/17/23  Trigger Finger (Stenosing Tenosynovitis) Bilateral hand pain with triggering in the right ring finger and left middle and ring fingers. The right ring finger triggers daily and the left hand has just started to trigger. The patient has poor diabetes control and renal disease  which increases the risk of surgical complications. -Refer to hand therapy at [facility] in [place] or Isley Zinni at [facility] on [facility] [address]. -Follow-up in 1 month to assess response to therapy and consider surgical intervention if no improvemen   PRECAUTIONS: None     WEIGHT BEARING RESTRICTIONS: No  PAIN:  Are you having pain?  2/10 pain over bilateral fourth digit A1 pulleys  FALLS: Has patient fallen in last 6 months? No  LIVING ENVIRONMENT: Lives with: lives with their family and lives alone    PLOF: Patient likes to work in the yard.  And watch TV.  Have a cat.  PATIENT GOALS: Get triggering in my hands better to avoid surgery    OBJECTIVE:  Note: Objective measures were completed at Evaluation unless otherwise noted.  HAND DOMINANCE: Right  ADLs: Can do most activities but triggering with any composite flexion or gripping with cleaning, driving, bathing and dressing, grocery shopping  FUNCTIONAL OUTCOME MEASURES: Will assess next session  UPPER EXTREMITY ROM:    AROM WNL bilateral wrist and forearm  Active  range of motion for composite flexion of digits within normal limits. Patient to report increased edema and stiffness in the morning. Thumb opposition within functional limits. Some thumb CMC arthritis in the left worse than right.   HAND FUNCTION: Grip strength: Right: 39 lbs; Left: 45 lbs, Lateral pinch: Right: 12 lbs, Left: 16 lbs, and 3 point pinch: Right: 9 lbs, Left: 11 lbs  COORDINATION: Some trouble with opening small packages and doing buttons  SENSATION: Denies any sensory issues  EDEMA: Do not show edema but reported in the mornings  COGNITION: Overall cognitive status: Within functional limits for tasks assessed        TREATMENT DATE: 04/10/23                                                                                                                            Patient to do contrast in the morning in the afternoon.   Followed by gentle active range of motion for tendon gliding. Reinforced with patient not to do forceful or squeeze. Focusing on gentle active range of motion. " Motion is lotion  10 reps symptom-free Followed by opposition to all digits focusing on oval 8 reps  Fabricated patient bilateral MC block splint for fourth digits to wear at nighttime to block composite flexion as well as functional activities that require composite fisting.  Reviewed with patient joint protection and modification by using larger and proximal joints for carrying and lifting as well as enlarging grips.      PATIENT EDUCATION: Education details: findings of eval and HEP  Person educated: Patient Education method: Explanation, Demonstration, Tactile cues, Verbal cues, and Handouts Education comprehension: verbalized understanding, returned demonstration, verbal cues required, and needs further education     GOALS: Goals reviewed with patient? Yes  Baseline: Goal status: INITIAL  LONG TERM GOALS: Target date: 6 wks  Patient to be independent in home program of wearing splints and do modifications to decrease triggering on the right hand by 50%. Baseline: Right hand triggers with every attempt of fisting or functional use. Goal status: INITIAL  2.  Left hand triggering decreased less than 2 episodes a day with no increased pain Baseline: Left fourth digit triggers more than 10 times.  Tenderness over the A1 pulley 2/10 Goal status: INITIAL  3.  Pain and tenderness in bilateral hands decrease to be symptom-free Baseline: Tenderness over the A1 pulleys of bilateral fourth digits 2/10 Goal status: INITIAL  4.  Patient verbalized 3 modifications and joint protection to decrease triggering and pain in bilateral hands Baseline: No knowledge.  Patient gripping and pulling wheat, driving carrying and gripping objects forcefully and tight because of some neuropathy because of diabetic as well as OA. Goal  status: INITIAL  ASSESSMENT:  CLINICAL IMPRESSION: Patient seen today for occupational therapy evaluation for bilateral trigger fingers - symptoms for about 6 months.  Right hand worse than the left.  With fourth digits worse than third digits.  Tenderness over  A1 pulleys 2/10.  Patient report triggering with every activity on the right fourth and more than 10 times in the left fourth.  Third digit intermittently.  Patient's active range of motion for bilateral digit flexion within normal limits including extension.  Patient do report increased edema in the morning with increased stiffness.  Patient hx of OA and diabetic- with some neuropathy at digits - Pt sleep with hands in fist as well as when watching tv. Did notice pt grasping and performing fisting forceful- pt do like to work in yard and has been pulling weed.  Patient's grip and prehension appeared within normal limits for her age.  With active range of motion and fisting within normal limits but increased pain and stiffness.  As well as triggering.  Limiting her functional use of bilateral hands in ADLs and IADLs.  Did fabricate bilateral MC block splints for fourth digits to wear at nighttime as well as during composite fisting activities.  Provided patient and reviewed with her range of motion exercises after contrast.  As well as education of joint protection and modifications.  Patient can benefit from skilled OT services to decrease pain and stiffness and decrease triggering.  PERFORMANCE DEFICITS: in functional skills including ADLs, IADLs, ROM, strength, pain, flexibility, decreased knowledge of use of DME, and UE functional use,   and psychosocial skills including environmental adaptation and routines and behaviors.   IMPAIRMENTS: are limiting patient from ADLs, IADLs, rest and sleep, play, leisure, and social participation.   COMORBIDITIES: has no other co-morbidities that affects occupational performance. Patient will benefit from  skilled OT to address above impairments and improve overall function.  MODIFICATION OR ASSISTANCE TO COMPLETE EVALUATION: No modification of tasks or assist necessary to complete an evaluation.  OT OCCUPATIONAL PROFILE AND HISTORY: Problem focused assessment: Including review of records relating to presenting problem.  CLINICAL DECISION MAKING: LOW - limited treatment options, no task modification necessary  REHAB POTENTIAL: Good for goals  EVALUATION COMPLEXITY: Low   PLAN:  OT FREQUENCY: 1-2x/week  OT DURATION: 6 weeks  PLANNED INTERVENTIONS: 97168 OT Re-evaluation, 97535 self care/ADL training, 01027 therapeutic exercise, 97140 manual therapy, 97035 ultrasound, 97018 paraffin, 25366 fluidotherapy, 97034 contrast bath, 97033 iontophoresis, 97760 Splinting (initial encounter), [id number] Subsequent splinting/medication, patient/family education, and DME and/or AE instructions   CONSULTED AND AGREED WITH PLAN OF CARE:      Oletta Cohn, OTR/L,CLT 04/10/2023, 6:06 PM

## 2023-04-14 NOTE — Progress Notes (Unsigned)
 No show

## 2023-04-18 ENCOUNTER — Ambulatory Visit: Admitting: Occupational Therapy

## 2023-04-18 ENCOUNTER — Ambulatory Visit (INDEPENDENT_AMBULATORY_CARE_PROVIDER_SITE_OTHER): Payer: Self-pay | Admitting: Psychiatry

## 2023-04-18 DIAGNOSIS — Z91199 Patient's noncompliance with other medical treatment and regimen due to unspecified reason: Secondary | ICD-10-CM

## 2023-04-21 ENCOUNTER — Ambulatory Visit: Admitting: Occupational Therapy

## 2023-04-25 ENCOUNTER — Ambulatory Visit: Admitting: Occupational Therapy

## 2023-04-27 ENCOUNTER — Encounter: Admitting: Occupational Therapy

## 2023-05-18 ENCOUNTER — Ambulatory Visit: Attending: Orthopedic Surgery | Admitting: Occupational Therapy

## 2023-05-18 DIAGNOSIS — M65331 Trigger finger, right middle finger: Secondary | ICD-10-CM | POA: Insufficient documentation

## 2023-05-18 DIAGNOSIS — M65342 Trigger finger, left ring finger: Secondary | ICD-10-CM | POA: Insufficient documentation

## 2023-05-18 DIAGNOSIS — M65332 Trigger finger, left middle finger: Secondary | ICD-10-CM | POA: Insufficient documentation

## 2023-05-18 DIAGNOSIS — M65341 Trigger finger, right ring finger: Secondary | ICD-10-CM | POA: Insufficient documentation

## 2023-05-23 ENCOUNTER — Ambulatory Visit: Admitting: Occupational Therapy

## 2023-05-23 DIAGNOSIS — M65331 Trigger finger, right middle finger: Secondary | ICD-10-CM | POA: Diagnosis present

## 2023-05-23 DIAGNOSIS — M65342 Trigger finger, left ring finger: Secondary | ICD-10-CM | POA: Diagnosis present

## 2023-05-23 DIAGNOSIS — M65341 Trigger finger, right ring finger: Secondary | ICD-10-CM

## 2023-05-23 DIAGNOSIS — M65332 Trigger finger, left middle finger: Secondary | ICD-10-CM | POA: Diagnosis present

## 2023-05-23 NOTE — Therapy (Signed)
 OUTPATIENT OCCUPATIONAL THERAPY ORTHO TREATMENT/RECERT  Patient Name: Abigail Walker MRN: [id number] DOB:1945-08-09, 78 y.o., female Today's Date: 05/23/2023  PCP: Dr Abel Hoe PROVIDER: Dr Mozell Arias  END OF SESSION:  OT End of Session - 05/23/23 0824     Visit Number 2    Number of Visits 10    Date for OT Re-Evaluation 07/18/23    OT Start Time 0823    OT Stop Time 0925    OT Time Calculation (min) 62 min    Activity Tolerance Patient tolerated treatment well    Behavior During Therapy [facility] for tasks assessed/performed             Past Medical History:  Diagnosis Date   Adult hypothyroidism 06/16/2013   Anemia    Aortic atherosclerosis (HCC)    Arthritis    Benign hypertension 06/16/2013   Carotid artery disease (HCC)    a.) carotid US  02/10/2020: 1-39% BICA; b.) carotid US  09/22/2021: 1-39% BICA   Onnie Bilis syndrome    CKD (chronic kidney disease), stage III (HCC)    Coronary artery calcification seen on CT scan    Depression    Esophageal spasm    Esophageal stricture    GERD (gastroesophageal reflux disease)    HBV (hepatitis B virus) infection    High blood pressure    HLD (hyperlipidemia)    Hypothyroidism    LBBB (left bundle branch block)    Lichen planus    Long term current use of antithrombotics/antiplatelets    a.) clopidogrel    Mixed incontinence    OSA on CPAP 06/16/2013   Pneumonia 2023   RLS (restless legs syndrome)    Secondary hyperparathyroidism of renal origin (HCC)    Skin cancer, basal cell    Sleep apnea    Stroke (HCC)    Thyroid  disease    Type 2 diabetes mellitus treated with insulin  (HCC)    Past Surgical History:  Procedure Laterality Date   CATARACT EXTRACTION Bilateral    COLONOSCOPY  2009   COLONOSCOPY WITH PROPOFOL  N/A 11/19/2021   Procedure: COLONOSCOPY WITH PROPOFOL ;  Surgeon: Shane Darling, MD;  Location: [facility] ENDOSCOPY;  Service: Endoscopy;  Laterality: N/A;  DM   DILATATION & CURETTAGE/HYSTEROSCOPY  WITH MYOSURE  10/14/2021   Procedure: MYOSURE POLYPECTOMY;  Surgeon: Prescilla Brod, MD;  Location: [facility] ORS;  Service: Gynecology;;   ESOPHAGOGASTRODUODENOSCOPY     2009, 2011   ESOPHAGOGASTRODUODENOSCOPY (EGD) WITH PROPOFOL  N/A 06/08/2015   Procedure: ESOPHAGOGASTRODUODENOSCOPY (EGD) WITH PROPOFOL ;  Surgeon: Stephens Eis, MD;  Location: [facility] ENDOSCOPY;  Service: Gastroenterology;  Laterality: N/A;   ESOPHAGOGASTRODUODENOSCOPY (EGD) WITH PROPOFOL  N/A 11/19/2021   Procedure: ESOPHAGOGASTRODUODENOSCOPY (EGD) WITH PROPOFOL ;  Surgeon: Shane Darling, MD;  Location: [facility] ENDOSCOPY;  Service: Endoscopy;  Laterality: N/A;   HEMORRHOID SURGERY     HYSTEROSCOPY WITH D & C N/A 10/14/2021   Procedure: DILATATION AND CURETTAGE /HYSTEROSCOPY;  Surgeon: Prescilla Brod, MD;  Location: [facility] ORS;  Service: Gynecology;  Laterality: N/A;   IMPLANTABLE CONTACT LENS IMPLANTATION     LYSIS OF ADHESION  10/14/2021   Procedure: LYSIS OF VAGINAL ADHESIONS;  Surgeon: Prescilla Brod, MD;  Location: [facility] ORS;  Service: Gynecology;;   TONSILLECTOMY     TUBAL LIGATION     Patient Active Problem List   Diagnosis Date Noted   [employer] Gyne[employer] and Obstetric[employer] (FIGO) malignant neoplasm of endometrium stage IB (HCC) 01/19/2022   Status post surgery 10/14/2021   Aspiration pneumonia (HCC) 08/11/2021  CAP (community acquired pneumonia) 08/10/2021   Chest pain 08/10/2021   Dysphagia 08/10/2021   Type 2 diabetes mellitus with peripheral neuropathy (HCC) 08/10/2021   PAD (peripheral artery disease) (HCC) 02/22/2020   Hypoglycemia due to type 2 diabetes mellitus (HCC)    Hypothermia    Dehydration    Hypoglycemia 01/09/2020   Moderate protein-calorie malnutrition (HCC) 01/08/2020   Fall    Hyponatremia    Hyperglycemia 12/05/2019   Acute lower UTI 12/05/2019   Depression    Lumbar compression fracture (HCC)    Weakness of left foot 09/14/2019   Hyperlipidemia 08/13/2019   Hypercalcemia  05/01/2019   Leukopenia 01/17/2019   Stage 3 chronic kidney disease (HCC) 01/17/2019   Benign hypertensive kidney disease with chronic kidney disease 01/16/2019   Hyposmolality and/or hyponatremia 01/16/2019   Secondary hyperparathyroidism of renal origin (HCC) 01/16/2019   Carotid artery disease (HCC) 08/10/2017   Chronic midline low back pain without sciatica 03/16/2017   Primary osteoarthritis of both hands 01/16/2017   Benign essential tremor 08/12/2015   MGUS (monoclonal gammopathy of unknown significance) 07/08/2015   Mixed incontinence 11/09/2014   Atrophic vaginitis 07/28/2014   Encounter for general adult medical examination without abnormal findings 05/28/2014   Major depression in remission (HCC) 11/27/2013   Morbid obesity (HCC) 11/16/2013   Microalbuminuria 08/06/2013   Breath shortness 08/02/2013   Acid reflux 06/16/2013   Benign hypertension 06/16/2013   Type 2 diabetes mellitus (HCC) 06/16/2013   Hypothyroidism 06/16/2013   Obstructive apnea 06/16/2013   Hyperlipidemia associated with type 2 diabetes mellitus (HCC) 40/98/1191   Oral lichen planus 05/10/2011   Lichen planus 11/09/2010    ONSET DATE: 6 months ago  REFERRING DIAG: Bilateral trigger fingers several digits  THERAPY DIAG:  Acquired trigger finger of both ring fingers  Acquired trigger finger of both middle fingers  Rationale for Evaluation and Treatment: Rehabilitation  SUBJECTIVE:   SUBJECTIVE STATEMENT: My ring finger still wants to trigger.  Mostly in the morning when it is stiff.  And then randomly during the day depending on what I do.   Pt accompanied by: self  PERTINENT HISTORY: DR Mozell Arias visit note 03/17/23  Trigger Finger (Stenosing Tenosynovitis) Bilateral hand pain with triggering in the right ring finger and left middle and ring fingers. The right ring finger triggers daily and the left hand has just started to trigger. The patient has poor diabetes control and renal disease which  increases the risk of surgical complications. -Refer to hand therapy at [facility] in [place] or Gracelin Weisberg at [facility] on [facility]. -Follow-up in 1 month to assess response to therapy and consider surgical intervention if no improvemen   PRECAUTIONS: None     WEIGHT BEARING RESTRICTIONS: No  PAIN:  Are you having pain?  2/10 pain over bilateral fourth digit A1 pulleys  FALLS: Has patient fallen in last 6 months? No  LIVING ENVIRONMENT: Lives with: lives with their family and lives alone    PLOF: Patient likes to work in the yard.  And watch TV.  Have a cat.  PATIENT GOALS: Get triggering in my hands better to avoid surgery    OBJECTIVE:  Note: Objective measures were completed at Evaluation unless otherwise noted.  HAND DOMINANCE: Right  ADLs: Can do most activities but triggering with any composite flexion or gripping with cleaning, driving, bathing and dressing, grocery shopping  FUNCTIONAL OUTCOME MEASURES: Will assess next session  UPPER EXTREMITY ROM:    AROM WNL bilateral wrist and forearm  Active range of motion for composite flexion of digits within normal limits. Stiff in the am  Patient to report increased edema and stiffness in the morning. Thumb opposition within functional limits. Some thumb CMC arthritis in the left worse than right.   HAND FUNCTION: Grip strength: Right: 39 lbs; Left: 45 lbs, Lateral pinch: Right: 12 lbs, Left: 16 lbs, and 3 point pinch: Right: 9 lbs, Left: 11 lbs 05/23/23 Grip strength: Right: 44 lbs; Left: 50 lbs, Lateral pinch: Right: 12 lbs, Left: 16 lbs, and 3 point pinch: Right: 9 lbs, Left: 11 lbs  COORDINATION: Some trouble with opening small packages and doing buttons  SENSATION: Denies any sensory issues  EDEMA: Do not show edema but reported in the mornings  COGNITION: Overall cognitive status: Within functional limits for tasks assessed        TREATMENT DATE: 05/23/23                                                                                                                              Patient return after not being seen for about 6 weeks.   Patient continues to have active range of motion within normal limits.  Stiffness in the morning with increased triggering.   Patient report depending on what she does during the day like pulling wheat and gripping-has increased trigger ring depending on activities level.    Patient also reports she lost her home program.   Reassess patient's active range of motion and grip and pinch and strength.  See flowsheet Also patient continues to over grip and squeeze objects.    Patient educated to do moist heat when stiffness in the morning or contrast if increased swelling in the morning. Fluidotherapy done 8 minutes to decrease stiffness increase motion in bilateral hands and wrist at start of care. Followed by review with patient wrist flexion extension with active assisted range of motion for extension 10 reps Followed by tendon glides.  With a focus on motion is lotion.  10 reps Opposition to all digits focusing on oval likely 10 reps  Reinforced with patient not to do forceful or squeeze. Focusing on gentle active range of motion. " Motion is lotion     Continue with bilateral MC block splint for fourth digits to wear at nighttime to block composite flexion as well as functional activities that require composite fisting.  Reviewed with patient joint protection and modification by using larger and proximal joints for carrying and lifting as well as enlarging grips. Use of metacarpal block splint Reviewed activities to modify.      PATIENT EDUCATION: Education details: findings of eval and HEP  Person educated: Patient Education method: Explanation, Demonstration, Tactile cues, Verbal cues, and Handouts Education comprehension: verbalized understanding, returned demonstration, verbal cues required, and needs further education      GOALS: Goals reviewed with patient? Yes    LONG TERM GOALS: Target date: 6 wks  Patient to be independent in home  program of wearing splints and do modifications to decrease triggering on the right hand by 50%. Baseline: Right hand triggers with every attempt of fisting or functional use. Now patient lost her home program Goal status: Progressing  2.  Left hand triggering decreased less than 2 episodes a day with no increased pain Baseline: Left fourth digit triggers more than 10 times.  Tenderness over the A1 pulley 2/10-continues to trigger during the day depending on activities and especially in the morning Goal status: Progressing  3.  Pain and tenderness in bilateral hands decrease to be symptom-free Baseline: Tenderness over the A1 pulleys of bilateral fourth digits 2/10 Goal status: Progressing  4.  Patient verbalized 3 modifications and joint protection to decrease triggering and pain in bilateral hands Baseline: No knowledge.  Patient gripping and pulling wheat, driving carrying and gripping objects forcefully and tight because of some neuropathy because of diabetic as well as OA. Goal status: Progressing  ASSESSMENT:  CLINICAL IMPRESSION: Patient seen today for occupational therapy reassessment for bilateral trigger fingers - symptoms for about 8 months.  Right hand worse than the left.  With fourth digits worse.  Tenderness over A1 pulleys 2/10.  Patient report triggering mostly in the morning the worst but also with every day activity on the fourth digits 5-10 times during the day depending on what she does.    Patient's active range of motion for bilateral digit flexion within normal limits including extension.  Patient do report increased edema in the morning with increased stiffness.  Patient hx of OA and diabetic- with some neuropathy at digits - Pt sleep with hands in fist as well as when watching tv. Did notice pt grasping and performing fisting forceful- pt do  like to work in yard and has been pulling weed.  Patient's grip and prehension appeared within normal limits for her age.  With active range of motion and fisting within normal limits but increased pain and stiffness.  As well as triggering.  Limiting her functional use of bilateral hands in ADLs and IADLs.  Reinforced with patient to continue to wear bilateral MC block splints for fourth digits to wear at nighttime as well as during composite fisting activities.  Reviewed with patient again joint protection and modifications to decrease triggering.  Initiated iontophoresis with dexamethasone  today.  Patient lost her home exercises and 6 weeks ago seen for evaluation.  Reviewed home exercises and changes as well as education of joint protection and modifications.  Patient can benefit from skilled OT services to decrease pain and stiffness and decrease triggering.  PERFORMANCE DEFICITS: in functional skills including ADLs, IADLs, ROM, strength, pain, flexibility, decreased knowledge of use of DME, and UE functional use,   and psychosocial skills including environmental adaptation and routines and behaviors.   IMPAIRMENTS: are limiting patient from ADLs, IADLs, rest and sleep, play, leisure, and social participation.   COMORBIDITIES: has no other co-morbidities that affects occupational performance. Patient will benefit from skilled OT to address above impairments and improve overall function.  MODIFICATION OR ASSISTANCE TO COMPLETE EVALUATION: No modification of tasks or assist necessary to complete an evaluation.  OT OCCUPATIONAL PROFILE AND HISTORY: Problem focused assessment: Including review of records relating to presenting problem.  CLINICAL DECISION MAKING: LOW - limited treatment options, no task modification necessary  REHAB POTENTIAL: Good for goals  EVALUATION COMPLEXITY: Low   PLAN:  OT FREQUENCY: 1-2x/week  OT DURATION: 8 wks  PLANNED INTERVENTIONS: 16109 OT Re-evaluation, 97535  self care/ADL training, 60454 therapeutic  exercise, 97140 manual therapy, 97035 ultrasound, 16109 paraffin, 97039 fluidotherapy, 97034 contrast bath, 97033 iontophoresis, 97760 Splinting (initial encounter), [id number] Subsequent splinting/medication, patient/family education, and DME and/or AE instructions   CONSULTED AND AGREED WITH PLAN OF CARE:      Heloise Lobo, OTR/L,CLT 05/23/2023, 9:42 AM

## 2023-06-05 ENCOUNTER — Other Ambulatory Visit: Payer: Self-pay | Admitting: Psychiatry

## 2023-06-05 ENCOUNTER — Telehealth: Payer: Self-pay | Admitting: Psychiatry

## 2023-06-05 MED ORDER — BUPROPION HCL ER (XL) 150 MG PO TB24: 450.0000 mg | ORAL_TABLET | Freq: Every day | ORAL | 0 refills | Status: DC

## 2023-06-05 MED ORDER — PAROXETINE HCL 20 MG PO TABS: 20.0000 mg | ORAL_TABLET | Freq: Every day | ORAL | 0 refills | Status: DC

## 2023-06-05 NOTE — Telephone Encounter (Signed)
 Ordered

## 2023-06-05 NOTE — Telephone Encounter (Signed)
 left message that rx had been sent to pharmacy

## 2023-06-06 NOTE — Progress Notes (Unsigned)
 [facility] MD/PA/NP OP Progress Note  06/13/2023 9:05 AM Abigail Walker  MRN:  [id number]  Chief Complaint:  Chief Complaint  Patient presents with   Follow-up   HPI:  This is a follow-up appointment for depression and anxiety.  She states that her hand is going "banana" after doing yard work.  She has arthritis.  She also has neuralgia in her neck.  She describes herself as lazy person when she is asked if she likes yard work.  She has bruises, and has been paying attention to this as she is on a blood thinner.  She also checks her sugar when she does work at the garden.  She had a visit from her grandson in [place] , who she has not seen since he was 79 year old.  She feels excited that he likes NASCAR as well.  She cooked some biscuit for memorial weekend as a treat as she does not usually eat much usually.  She states that although she still thinks about her close friend, she also feels glad that Masae did not see her friend at her last time, referring to the possible cognitive impairment. Her friend was very smart. She now has a warm memory.  Although she may feel down at times, she usually feels better on days with better weather ("weather person".) she sleeps well.  She denies change in appetite.  She denies SI.  Although she feels anxious at times, she denies any significant concern at this time.  She denies SI.  She has some tinnitus.  She denies VH.   Substance use   Tobacco Alcohol Other substances/  Current denies denies denies  Past Smoked 20 years in the past, last in 1990's Denies (hated alcohol due to her mother's history of alcohol use) denies  Past Treatment            Support: Household: by herself Marital status: divorced after ten years of marriage,  until 1979 Number of children: 1 daughter (deceased from cancer. She used to have issues with drug).  She was unable to see her grandchildren for 10 years since the loss of her daughter, although she reconnected recently Employment:  retired, Public librarian in the past Education: High school. Received six months of nurse's training at age 25 She states that her parents did not pay attention to her.  She never had hugs, kisses except from her maternal grandmother.  Her mother abused alcohol, and she "raised myself."    Visit Diagnosis:    ICD-10-CM   1. MDD (major depressive disorder), recurrent, in partial remission (HCC)  F33.41     2. Anxiety state  F41.1     3. Neurocognitive deficits  R29.818    R41.89       Past Psychiatric History: Please see initial evaluation for full details. I have reviewed the history. No updates at this time.     Past Medical History:  Past Medical History:  Diagnosis Date   Adult hypothyroidism 06/16/2013   Anemia    Aortic atherosclerosis (HCC)    Arthritis    Benign hypertension 06/16/2013   Carotid artery disease (HCC)    a.) carotid US  02/10/2020: 1-39% BICA; b.) carotid US  09/22/2021: 1-39% BICA   Onnie Bilis syndrome    CKD (chronic kidney disease), stage III (HCC)    Coronary artery calcification seen on CT scan    Depression    Esophageal spasm    Esophageal stricture    GERD (gastroesophageal reflux disease)    HBV (  hepatitis B virus) infection    High blood pressure    HLD (hyperlipidemia)    Hypothyroidism    LBBB (left bundle branch block)    Lichen planus    Long term current use of antithrombotics/antiplatelets    a.) clopidogrel    Mixed incontinence    OSA on CPAP 06/16/2013   Pneumonia 2023   RLS (restless legs syndrome)    Secondary hyperparathyroidism of renal origin (HCC)    Skin cancer, basal cell    Sleep apnea    Stroke (HCC)    Thyroid  disease    Type 2 diabetes mellitus treated with insulin  [facility])     Past Surgical History:  Procedure Laterality Date   CATARACT EXTRACTION Bilateral    COLONOSCOPY  2009   COLONOSCOPY WITH PROPOFOL  N/A 11/19/2021   Procedure: COLONOSCOPY WITH PROPOFOL ;  Surgeon: Shane Darling, MD;  Location:  [facility] ENDOSCOPY;  Service: Endoscopy;  Laterality: N/A;  DM   DILATATION & CURETTAGE/HYSTEROSCOPY WITH MYOSURE  10/14/2021   Procedure: MYOSURE POLYPECTOMY;  Surgeon: Prescilla Brod, MD;  Location: [facility] ORS;  Service: Gynecology;;   ESOPHAGOGASTRODUODENOSCOPY     2009, 2011   ESOPHAGOGASTRODUODENOSCOPY (EGD) WITH PROPOFOL  N/A 06/08/2015   Procedure: ESOPHAGOGASTRODUODENOSCOPY (EGD) WITH PROPOFOL ;  Surgeon: Stephens Eis, MD;  Location: [facility] ENDOSCOPY;  Service: Gastroenterology;  Laterality: N/A;   ESOPHAGOGASTRODUODENOSCOPY (EGD) WITH PROPOFOL  N/A 11/19/2021   Procedure: ESOPHAGOGASTRODUODENOSCOPY (EGD) WITH PROPOFOL ;  Surgeon: Shane Darling, MD;  Location: [facility] ENDOSCOPY;  Service: Endoscopy;  Laterality: N/A;   HEMORRHOID SURGERY     HYSTEROSCOPY WITH D & C N/A 10/14/2021   Procedure: DILATATION AND CURETTAGE /HYSTEROSCOPY;  Surgeon: Prescilla Brod, MD;  Location: [facility] ORS;  Service: Gynecology;  Laterality: N/A;   IMPLANTABLE CONTACT LENS IMPLANTATION     LYSIS OF ADHESION  10/14/2021   Procedure: LYSIS OF VAGINAL ADHESIONS;  Surgeon: Prescilla Brod, MD;  Location: [facility] ORS;  Service: Gynecology;;   TONSILLECTOMY     TUBAL LIGATION      Family Psychiatric History: Please see initial evaluation for full details. I have reviewed the history. No updates at this time.     Family History:  Family History  Problem Relation Age of Onset   Alcohol abuse Mother    Cancer Mother    Heart disease Father    Kidney failure Maternal Aunt     Social History:  Social History   Socioeconomic History   Marital status: Divorced    Spouse name: Not on file   Number of children: Not on file   Years of education: Not on file   Highest education level: Some college, no degree  Occupational History   Not on file  Tobacco Use   Smoking status: Former    Current packs/day: 0.00    Types: Cigarettes    Quit date: 07/27/1993    Years since quitting: 29.8   Smokeless tobacco: Never    Tobacco comments:    quit 20 years  Vaping Use   Vaping status: Never Used  Substance and Sexual Activity   Alcohol use: No    Alcohol/week: 0.0 standard drinks of alcohol   Drug use: No   Sexual activity: Not Currently    Birth control/protection: Post-menopausal  Other Topics Concern   Not on file  Social History Narrative   Lives alone   Social Drivers of Health   Financial Resource Strain: Low Risk  (03/01/2023)   Received from [facility]   Overall Financial Resource  Strain (CARDIA)    Difficulty of Paying Living Expenses: Not very hard  Food Insecurity: No Food Insecurity (03/01/2023)   [facility]   Hunger Vital Sign    Worried About Running Out of Food in the Last Year: Never true    Ran Out of Food in the Last Year: Never true  Transportation Needs: No Transportation Needs (03/01/2023)   Received from [facility] - Transportation    In the past 12 months, has lack of transportation kept you from medical appointments or from getting medications?: No    Lack of Transportation (Non-Medical): No  Physical Activity: Inactive (12/01/2021)   Received from [facility], [facility]   Exercise Vital Sign    Days of Exercise per Week: 0 days    Minutes of Exercise per Session: 0 min  Stress: Not on file  Social Connections: Socially Isolated (12/01/2021)   Received from [facility], [facility]   Social Connection and Isolation Panel [NHANES]    Frequency of Communication with Friends and Family: Never    Frequency of Social Gatherings with Friends and Family: Never    Attends Religious Services: Never    Database administrator or Organizations: No    Attends Banker Meetings: Never    Marital Status: Divorced    Allergies:  Allergies  Allergen Reactions   Amoxicillin -Pot Clavulanate Rash   Metformin And  Related Nausea And Vomiting   Sulfa Antibiotics Nausea And Vomiting   Hydrocortisone-Iodoquinol  [Hydrocortisone-Iodoquinol] Rash    Patient reports that she does not believe that she has allergy to this, that she was receiving many medications at once.    Metabolic Disorder Labs: Lab Results  Component Value Date   HGBA1C 6.6 (H) 10/27/2021   MPG 142.72 10/27/2021   MPG 145.59 08/11/2021   No results found for: "PROLACTIN" No results found for: "CHOL", "TRIG", "HDL", "CHOLHDL", "VLDL", "LDLCALC" Lab Results  Component Value Date   TSH 0.061 (L) 10/27/2021   TSH 11.315 (H) 01/09/2020    Therapeutic Level Labs: No results found for: "LITHIUM" No results found for: "VALPROATE" No results found for: "CBMZ"  Current Medications: Current Outpatient Medications  Medication Sig Dispense Refill   acetaminophen  (TYLENOL ) 500 MG tablet Take 500 mg by mouth every 6 (six) hours as needed for moderate pain.     buPROPion  (WELLBUTRIN  XL) 150 MG 24 hr tablet Take 3 tablets (450 mg total) by mouth daily. 90 tablet 0   Cholecalciferol  (VITAMIN D3) 50 MCG (2000 UT) TABS Take 2 tablets by mouth daily.     clobetasol  ointment (TEMOVATE ) 0.05 % APPLY ONE APPLICATION TOPICALLY DAILY. VAGINA 60 g 0   clopidogrel  (PLAVIX ) 75 MG tablet Take 1 tablet (75 mg total) by mouth at bedtime. 30 tablet 0   cyanocobalamin (VITAMIN B12) 1000 MCG tablet Take 1,000 mcg by mouth daily.     docusate sodium  (COLACE) 100 MG capsule Take 1 capsule (100 mg total) by mouth daily. 30 capsule 0   empagliflozin (JARDIANCE) 25 MG TABS tablet Take by mouth.     ferrous sulfate  325 (65 FE) MG tablet Take 1 tablet (325 mg total) by mouth daily with breakfast. (Patient taking differently: Take 325 mg by mouth daily.) 30 tablet 0   insulin  glargine (LANTUS ) 100 UNIT/ML injection Inject 0.15 mLs (15 Units total) into the skin at bedtime. (Patient taking differently: Inject 15 Units into  the skin daily. morning) 10 mL 0   Insulin   Syringe-Needle U-100 (INSULIN  SYRINGE 1CC/31GX5/16") 31G X 5/16" 1 ML MISC USE ONE SYRINGE TWICE DAILY AS DIRECTED WITH INSULIN  VIALS     levothyroxine  (SYNTHROID ) 75 MCG tablet Take 1 tablet (75 mcg total) by mouth daily before breakfast. (Patient taking differently: Take 20 mcg by mouth daily before breakfast.) 30 tablet 0   lidocaine -prilocaine  (EMLA ) cream Apply 1 Application topically as needed. To your vulva for pain control 30 g 0   lisinopril  (ZESTRIL ) 20 MG tablet Take 10 mg by mouth daily.     lisinopril  (ZESTRIL ) 5 MG tablet Take 5 mg by mouth every morning.     NOVOLOG  100 UNIT/ML injection Inject 6 Units into the skin in the morning and at bedtime. (Patient taking differently: Inject 4-6 Units into the skin in the morning and at bedtime. 4 units in the morning and 6 units at bedtime) 10 mL 0   nystatin cream (MYCOSTATIN) Apply 1 application topically as needed (yeast).     PARoxetine  (PAXIL ) 20 MG tablet Take 1 tablet (20 mg total) by mouth daily. 30 tablet 0   rosuvastatin  (CRESTOR ) 40 MG tablet Take 1 tablet (40 mg total) by mouth at bedtime. 90 tablet 0   torsemide  (DEMADEX ) 10 MG tablet Take 1 tablet (10 mg total) by mouth every other day. 30 tablet 0   No current facility-administered medications for this visit.     Musculoskeletal: Strength & Muscle Tone: within normal limits Gait & Station: normal Patient leans: N/A  Psychiatric Specialty Exam: Review of Systems  Psychiatric/Behavioral:  Positive for dysphoric mood. Negative for agitation, behavioral problems, confusion, decreased concentration, hallucinations, self-injury, sleep disturbance and suicidal ideas. The patient is nervous/anxious. The patient is not hyperactive.   All other systems reviewed and are negative.   Blood pressure 124/68, pulse 77, temperature 98 F (36.7 C), temperature source Temporal, height 5' (1.524 m), weight 133 lb 9.6 oz (60.6 kg), SpO2 94%.Body mass index is 26.09 kg/m.  General  Appearance: Well Groomed  Eye Contact:  Good  Speech:  Clear and Coherent  Volume:  Normal  Mood:  okay  Affect:  Appropriate, Congruent, and Full Range  Thought Process:  Coherent  Orientation:  Full (Time, Place, and Person)  Thought Content: Logical   Suicidal Thoughts:  No  Homicidal Thoughts:  No  Memory:  Immediate;   Good  Judgement:  Good  Insight:  Good  Psychomotor Activity:  Normal  Concentration:  Concentration: Good and Attention Span: Good  Recall:  Good  Fund of Knowledge: Good  Language: Good  Akathisia:  No  Handed:  Right  AIMS (if indicated): not done  Assets:  Communication Skills Desire for Improvement  ADL's:  Intact  Cognition: WNL  Sleep:  Good   Screenings: GAD-7    Flowsheet Row Office Visit from 06/13/2023 in [facility]  Total GAD-7 Score 4      PHQ2-9    Flowsheet Row Office Visit from 06/13/2023 in [facility] Office Visit from 06/06/2022 in [facility]  PHQ-2 Total Score 1 1  PHQ-9 Total Score -- 5      Flowsheet Row ED from 10/30/2022 in [facility] (Discharged) from 10/14/2021 in [facility] from 08/10/2021 in [facility] at [facility]  C-SSRS RISK CATEGORY No Risk No Risk No Risk  Assessment and Plan:  BREHANNA DEVENY is a 78 y.o. year old female with a history of depression, endometrial adenocarcinoma s/p hysterectomy and bilateral SPO, not on chemotherapy or radiation secondary to lichen planus, right occipital infarcts in 2021, cognitive improvements, type II diabetes, hypertension, hyperlipidemia, LBBB, who is referred for depression.   1. MDD (major depressive disorder), recurrent, in partial remission (HCC) 2. Anxiety state Acute stressors include: financial strain in the setting of  buying a car, loss of her close friend  Other stressors include: absence of nurturing except her maternal grandfather (her mother had alcohol use issues) History: depression since child, originally on Paxil  20 mg daily, bupropion  450 mg daily    Exam is notable for brighter affect, and she demonstrates less rumination over her medical condition since restarting bupropion .  Will continue Paxil  to target depression and anxiety, along with bupropion  adjunctive treatment for depression. Although she will greatly benefit from CBT, she is not interested in this due to the past experience.   3. Neurocognitive deficits # Hallucinations Functional Status   IADL: Independent in the following: managing finances, medications, driving, cooking (only occasionally)           Requires assistance with the following:  ADL  Independent in the following: bathing and hygiene, feeding, continence, grooming and toileting, walking          Requires assistance with the following: Folate, Vitamin B12 (wnl 11/2022), TSH wnl 04/2022 Images Brain: Chronic white matter ischemic change. No evidence of acute infarction, hemorrhage, hydrocephalus, extra-axial collection or mass lesion/mass effect. Neuropsych assessment: SLUMS 27 on 11/2021, MOCA 22/30 09/2022 Etiology: VaD    No signifciant change. IADL is independent, although she does have some validity deficits as evidenced by MoCA. She has a history of chronic hallucinations, likely secondary to NPS, and she denies significant concern about this.  Will not start antipsychotics at this time given its risk outweighs benefit.   Plan  Continue Paxil  20 mg daily Continue bupropion  450 mg daily   Next appointment: 7/21 at 8:30, IP   The patient demonstrates the following risk factors for suicide: Chronic risk factors for suicide include: psychiatric disorder of depression . Acute risk factors for suicide include: unemployment and loss (financial, interpersonal, professional).  Protective factors for this patient include: coping skills and hope for the future. Considering these factors, the overall suicide risk at this point appears to be low. Patient is appropriate for outpatient follow up.   A total of 30 minutes was spent on the following activities during the encounter date, which includes but is not limited to: preparing to see the patient (e.g., reviewing tests and records), obtaining and/or reviewing separately obtained history, performing a medically necessary examination or evaluation, counseling and educating the patient, family, or caregiver, ordering medications, tests, or procedures, referring and communicating with other healthcare professionals (when not reported separately), documenting clinical information in the electronic or paper health record, independently interpreting test or lab results and communicating these results to the family or caregiver, and coordinating care (when not reported separately).     Collaboration of Care: Collaboration of Care: Other reviewed notes in Epic  Patient/Guardian was advised Release of Information must be obtained prior to any record release in order to collaborate their care with an outside provider. Patient/Guardian was advised if they have not already done so to contact the registration department to sign all necessary forms in order for us  to release information regarding their care.   Consent: Patient/Guardian gives verbal  consent for treatment and assignment of benefits for services provided during this visit. Patient/Guardian expressed understanding and agreed to proceed.    Todd Fossa, MD 06/13/2023, 9:05 AM

## 2023-06-13 ENCOUNTER — Encounter: Payer: Self-pay | Admitting: Psychiatry

## 2023-06-13 ENCOUNTER — Ambulatory Visit (INDEPENDENT_AMBULATORY_CARE_PROVIDER_SITE_OTHER): Admitting: Psychiatry

## 2023-06-13 VITALS — BP 124/68 | HR 77 | Temp 98.0°F | Ht 60.0 in | Wt 133.6 lb

## 2023-06-13 DIAGNOSIS — F3341 Major depressive disorder, recurrent, in partial remission: Secondary | ICD-10-CM

## 2023-06-13 DIAGNOSIS — F411 Generalized anxiety disorder: Secondary | ICD-10-CM

## 2023-06-13 DIAGNOSIS — R29818 Other symptoms and signs involving the nervous system: Secondary | ICD-10-CM | POA: Diagnosis not present

## 2023-06-13 DIAGNOSIS — R4189 Other symptoms and signs involving cognitive functions and awareness: Secondary | ICD-10-CM | POA: Diagnosis not present

## 2023-06-13 MED ORDER — PAROXETINE HCL 20 MG PO TABS: 20.0000 mg | ORAL_TABLET | Freq: Every day | ORAL | 0 refills | Status: DC

## 2023-06-13 MED ORDER — BUPROPION HCL ER (XL) 150 MG PO TB24: 450.0000 mg | ORAL_TABLET | Freq: Every day | ORAL | 0 refills | Status: DC

## 2023-06-13 NOTE — Patient Instructions (Signed)
 Continue Paxil  20 mg daily Continue bupropion  450 mg daily   Next appointment: 7/21 at 8:30

## 2023-06-15 ENCOUNTER — Ambulatory Visit: Admitting: Occupational Therapy

## 2023-06-22 ENCOUNTER — Ambulatory Visit: Attending: Orthopedic Surgery | Admitting: Occupational Therapy

## 2023-06-22 DIAGNOSIS — M65331 Trigger finger, right middle finger: Secondary | ICD-10-CM | POA: Insufficient documentation

## 2023-06-22 DIAGNOSIS — M65341 Trigger finger, right ring finger: Secondary | ICD-10-CM | POA: Diagnosis present

## 2023-06-22 DIAGNOSIS — M65332 Trigger finger, left middle finger: Secondary | ICD-10-CM | POA: Insufficient documentation

## 2023-06-22 DIAGNOSIS — M65342 Trigger finger, left ring finger: Secondary | ICD-10-CM | POA: Diagnosis present

## 2023-06-22 NOTE — Therapy (Signed)
 OUTPATIENT OCCUPATIONAL THERAPY ORTHO TREATMENT  Patient Name: Abigail Walker MRN: [id number] DOB:1945-06-17, 78 y.o., female Today's Date: 06/22/2023  PCP: Dr Abel Hoe PROVIDER: Dr Mozell Arias  END OF SESSION:  OT End of Session - 06/22/23 1537     Visit Number 3    Number of Visits 10    Date for OT Re-Evaluation 07/18/23    OT Start Time 1536    Activity Tolerance Patient tolerated treatment well    Behavior During Therapy [facility] for tasks assessed/performed             Past Medical History:  Diagnosis Date   Adult hypothyroidism 06/16/2013   Anemia    Aortic atherosclerosis (HCC)    Arthritis    Benign hypertension 06/16/2013   Carotid artery disease (HCC)    a.) carotid US  02/10/2020: 1-39% BICA; b.) carotid US  09/22/2021: 1-39% BICA   Onnie Bilis syndrome    CKD (chronic kidney disease), stage III (HCC)    Coronary artery calcification seen on CT scan    Depression    Esophageal spasm    Esophageal stricture    GERD (gastroesophageal reflux disease)    HBV (hepatitis B virus) infection    High blood pressure    HLD (hyperlipidemia)    Hypothyroidism    LBBB (left bundle branch block)    Lichen planus    Long term current use of antithrombotics/antiplatelets    a.) clopidogrel    Mixed incontinence    OSA on CPAP 06/16/2013   Pneumonia 2023   RLS (restless legs syndrome)    Secondary hyperparathyroidism of renal origin (HCC)    Skin cancer, basal cell    Sleep apnea    Stroke (HCC)    Thyroid  disease    Type 2 diabetes mellitus treated with insulin  [facility])    Past Surgical History:  Procedure Laterality Date   CATARACT EXTRACTION Bilateral    COLONOSCOPY  2009   COLONOSCOPY WITH PROPOFOL  N/A 11/19/2021   Procedure: COLONOSCOPY WITH PROPOFOL ;  Surgeon: Shane Darling, MD;  Location: [facility] ENDOSCOPY;  Service: Endoscopy;  Laterality: N/A;  DM   DILATATION & CURETTAGE/HYSTEROSCOPY WITH MYOSURE  10/14/2021   Procedure: MYOSURE POLYPECTOMY;   Surgeon: Prescilla Brod, MD;  Location: [facility] ORS;  Service: Gynecology;;   ESOPHAGOGASTRODUODENOSCOPY     2009, 2011   ESOPHAGOGASTRODUODENOSCOPY (EGD) WITH PROPOFOL  N/A 06/08/2015   Procedure: ESOPHAGOGASTRODUODENOSCOPY (EGD) WITH PROPOFOL ;  Surgeon: Stephens Eis, MD;  Location: [facility] ENDOSCOPY;  Service: Gastroenterology;  Laterality: N/A;   ESOPHAGOGASTRODUODENOSCOPY (EGD) WITH PROPOFOL  N/A 11/19/2021   Procedure: ESOPHAGOGASTRODUODENOSCOPY (EGD) WITH PROPOFOL ;  Surgeon: Shane Darling, MD;  Location: [facility] ENDOSCOPY;  Service: Endoscopy;  Laterality: N/A;   HEMORRHOID SURGERY     HYSTEROSCOPY WITH D & C N/A 10/14/2021   Procedure: DILATATION AND CURETTAGE /HYSTEROSCOPY;  Surgeon: Prescilla Brod, MD;  Location: [facility] ORS;  Service: Gynecology;  Laterality: N/A;   IMPLANTABLE CONTACT LENS IMPLANTATION     LYSIS OF ADHESION  10/14/2021   Procedure: LYSIS OF VAGINAL ADHESIONS;  Surgeon: Prescilla Brod, MD;  Location: [facility] ORS;  Service: Gynecology;;   TONSILLECTOMY     TUBAL LIGATION     Patient Active Problem List   Diagnosis Date Noted   [employer]ne[employer] Ob[employer] (FIGO) malignant neoplasm of endometrium stage IB (HCC) 01/19/2022   Status post surgery 10/14/2021   Aspiration pneumonia (HCC) 08/11/2021   CAP (community acquired pneumonia) 08/10/2021   Chest pain 08/10/2021   Dysphagia 08/10/2021  Type 2 diabetes mellitus with peripheral neuropathy (HCC) 08/10/2021   PAD (peripheral artery disease) (HCC) 02/22/2020   Hypoglycemia due to type 2 diabetes mellitus (HCC)    Hypothermia    Dehydration    Hypoglycemia 01/09/2020   Moderate protein-calorie malnutrition (HCC) 01/08/2020   Fall    Hyponatremia    Hyperglycemia 12/05/2019   Acute lower UTI 12/05/2019   Depression    Lumbar compression fracture (HCC)    Weakness of left foot 09/14/2019   Hyperlipidemia 08/13/2019   Hypercalcemia 05/01/2019   Leukopenia 01/17/2019   Stage 3 chronic kidney  disease (HCC) 01/17/2019   Benign hypertensive kidney disease with chronic kidney disease 01/16/2019   Hyposmolality and/or hyponatremia 01/16/2019   Secondary hyperparathyroidism of renal origin (HCC) 01/16/2019   Carotid artery disease (HCC) 08/10/2017   Chronic midline low back pain without sciatica 03/16/2017   Primary osteoarthritis of both hands 01/16/2017   Benign essential tremor 08/12/2015   MGUS (monoclonal gammopathy of unknown significance) 07/08/2015   Mixed incontinence 11/09/2014   Atrophic vaginitis 07/28/2014   Encounter for general adult medical examination without abnormal findings 05/28/2014   Major depression in remission (HCC) 11/27/2013   Morbid obesity (HCC) 11/16/2013   Microalbuminuria 08/06/2013   Breath shortness 08/02/2013   Acid reflux 06/16/2013   Benign hypertension 06/16/2013   Type 2 diabetes mellitus (HCC) 06/16/2013   Hypothyroidism 06/16/2013   Obstructive apnea 06/16/2013   Hyperlipidemia associated with type 2 diabetes mellitus (HCC) 86/57/8469   Oral lichen planus 05/10/2011   Lichen planus 11/09/2010    ONSET DATE: 6 months ago  REFERRING DIAG: Bilateral trigger fingers several digits  THERAPY DIAG:  Acquired trigger finger of both ring fingers  Acquired trigger finger of both middle fingers  Rationale for Evaluation and Treatment: Rehabilitation  SUBJECTIVE:   SUBJECTIVE STATEMENT: My trigger fingers is much better.  I am doing my exercises several times during the day.  Trying to use my palm's and make my handles bigger.  Had trouble sliding the little splints on.  I worked in the yard a lot last few days.  I have poison ivy on my left forearm.  HI had to fast  last night because I had blood work this morning.  My blood sugar I just checked is a little low.  Pt accompanied by: self  PERTINENT HISTORY: DR Mozell Arias visit note 03/17/23  Trigger Finger (Stenosing Tenosynovitis) Bilateral hand pain with triggering in the right ring finger  and left middle and ring fingers. The right ring finger triggers daily and the left hand has just started to trigger. The patient has poor diabetes control and renal disease which increases the risk of surgical complications. -Refer to hand therapy at [facility] in [place] or Terril Chestnut at [facility] on [facility]. -Follow-up in 1 month to assess response to therapy and consider surgical intervention if no improvemen   PRECAUTIONS: None     WEIGHT BEARING RESTRICTIONS: No  PAIN:  Are you having pain?  Patient reports more feeling of stiffness and swelling at times in the hands  FALLS: Has patient fallen in last 6 months? No  LIVING ENVIRONMENT: Lives with: lives with their family and lives alone    PLOF: Patient likes to work in the yard.  And watch TV.  Have a cat.  PATIENT GOALS: Get triggering in my hands better to avoid surgery    OBJECTIVE:  Note: Objective measures were completed at Evaluation unless otherwise noted.  HAND DOMINANCE: Right  ADLs: Can do most activities but triggering with any composite flexion or gripping with cleaning, driving, bathing and dressing, grocery shopping  FUNCTIONAL OUTCOME MEASURES: Will assess next session  UPPER EXTREMITY ROM:    AROM WNL bilateral wrist and forearm  Active range of motion for composite flexion of digits within normal limits. Stiff in the am  Patient to report increased edema and stiffness in the morning. Thumb opposition within functional limits. Some thumb CMC arthritis in the left worse than right.   HAND FUNCTION: Grip strength: Right: 39 lbs; Left: 45 lbs, Lateral pinch: Right: 12 lbs, Left: 16 lbs, and 3 point pinch: Right: 9 lbs, Left: 11 lbs 05/23/23 Grip strength: Right: 44 lbs; Left: 50 lbs, Lateral pinch: Right: 12 lbs, Left: 16 lbs, and 3 point pinch: Right: 9 lbs, Left: 11 lbs 06/22/23 Grip strength: Right: 49 lbs; Left: 50 lbs, Lateral pinch: Right: 12 lbs, Left: 16 lbs, and 3 point pinch:  Right: 11 lbs, Left: 11 lbs  COORDINATION: Some trouble with opening small packages and doing buttons  SENSATION: Denies any sensory issues  EDEMA: Do not show edema but reported in the mornings  COGNITION: Overall cognitive status: Within functional limits for tasks assessed        TREATMENT DATE: 06/22/23                                                                                                                             Patient return after not being seen for about month  Patient continues to have active range of motion within normal limits.  Stiffness in the morning but report triggering of fingers is less.  Not even happening every day.   Patient report depending on what she does during the day like pulling weed and gripping-but she is trying to modify how she does things.   Reassess patient's active range of motion and grip and pinch and strength.  See flowsheet Patient did not had any triggering in session.  Reinforced with patient again home program and the importance of moist heat when stiffness in the morning or contrast if increased swelling in the morning. Followed by review with patient wrist flexion extension with active assisted range of motion for extension 10 reps Followed by tendon glides.  With a focus on motion is lotion.  10 reps Opposition to all digits focusing on oval likely 10 reps  Reinforced with patient not to do forceful or squeeze. Focusing on gentle active range of motion. " Motion is lotion     Reviewed with patient donning and wearing of bilateral MC block splint for fourth digits to wear at nighttime to block composite flexion as well as functional activities that require composite fisting. Patient was attempting to slide it on but patient has to loosen up a little straps to get it over her PIP joints. Patient verbalized understanding  Reinforced also with patient again  joint protection and modification by using larger and proximal  joints for carrying and lifting as well as enlarging grips. As well as modifying activities.  Patient did arrive today with blood sugar little low.  She was eating a candy to bring it up.  But during session appear not to be successful provide patient and stay with the patient eating 3 little pieces of chocolate as well as apple juice and blood sugar came up during session. Patient with some trouble following directions today but improved.      PATIENT EDUCATION: Education details: findings of eval and HEP  Person educated: Patient Education method: Explanation, Demonstration, Tactile cues, Verbal cues, and Handouts Education comprehension: verbalized understanding, returned demonstration, verbal cues required, and needs further education     GOALS: Goals reviewed with patient? Yes    LONG TERM GOALS: Target date: 6 wks  Patient to be independent in home program of wearing splints and do modifications to decrease triggering on the right hand by 50%. Baseline: Right hand triggers with every attempt of fisting or functional use. Now patient lost her home program Goal status:MET  2.  Left hand triggering decreased less than 2 episodes a day with no increased pain Baseline: Left fourth digit triggers more than 10 times.  Tenderness over the A1 pulley 2/10-continues to trigger during the day depending on activities and especially in the morning Goal status: MET  3.  Pain and tenderness in bilateral hands decrease to be symptom-free Baseline: Tenderness over the A1 pulleys of bilateral fourth digits 2/10 Goal statusMET  4.  Patient verbalized 3 modifications and joint protection to decrease triggering and pain in bilateral hands Baseline: No knowledge.  Patient gripping and pulling wheat, driving carrying and gripping objects forcefully and tight because of some neuropathy because of diabetic as well as OA. Goal status: MET  ASSESSMENT:  CLINICAL IMPRESSION: Patient seen for  occupational therapy reassessment for bilateral trigger fingers - symptoms for about 8 months.  Right hand worse than the left.  With fourth digits worse.  Tenderness over A1 pulleys 2/10.  Patient report triggering mostly in the morning the worst but also with every day activity on the fourth digits 5-10 times during the day depending on what she does.    Patient's active range of motion for bilateral digit flexion within normal limits including extension.  Patient do report increased edema in the morning with increased stiffness.  Patient hx of OA and diabetic- with some neuropathy at digits - Pt sleep with hands in fist as well as when watching tv. Did notice pt grasping and performing fisting forceful- pt do like to work in yard and has been pulling weed.  Patient's grip and prehension appeared within normal limits for her age.  With active range of motion and fisting within normal limits but increased pain and stiffness.  As well as triggering.  Limiting her functional use of bilateral hands in ADLs and IADLs.  Patient was seen for about 2 visits in 10 weeks.  With great progress in triggering as well as tenderness and pain.  Patient report triggering is better.  Not daily.  Patient appears to be independent and tendon glides as well as modification.  Reviewed with patient again wearing of her MC block splints.  Patient do feel she can continue at home with home program.  Patient discharged at this time.    PERFORMANCE DEFICITS: in functional skills including ADLs, IADLs, ROM, strength, pain, flexibility, decreased knowledge of use of DME, and UE functional use,   and psychosocial skills including  environmental adaptation and routines and behaviors.   IMPAIRMENTS: are limiting patient from ADLs, IADLs, rest and sleep, play, leisure, and social participation.   COMORBIDITIES: has no other co-morbidities that affects occupational performance. Patient will benefit from skilled OT to address above  impairments and improve overall function.  MODIFICATION OR ASSISTANCE TO COMPLETE EVALUATION: No modification of tasks or assist necessary to complete an evaluation.  OT OCCUPATIONAL PROFILE AND HISTORY: Problem focused assessment: Including review of records relating to presenting problem.  CLINICAL DECISION MAKING: LOW - limited treatment options, no task modification necessary  REHAB POTENTIAL: Good for goals  EVALUATION COMPLEXITY: Low   PLAN:  OT FREQUENCY: 1-2x/week  OT DURATION: 8 wks  PLANNED INTERVENTIONS: 97168 OT Re-evaluation, 97535 self care/ADL training, 16109 therapeutic exercise, 97140 manual therapy, 97035 ultrasound, 97018 paraffin, 60454 fluidotherapy, 97034 contrast bath, 97033 iontophoresis, 97760 Splinting (initial encounter), [id number] Subsequent splinting/medication, patient/family education, and DME and/or AE instructions   CONSULTED AND AGREED WITH PLAN OF CARE:      Heloise Lobo, OTR/L,CLT 06/22/2023, 3:37 PM

## 2023-06-27 ENCOUNTER — Ambulatory Visit: Admitting: Occupational Therapy

## 2023-06-28 ENCOUNTER — Telehealth: Payer: Self-pay | Admitting: Obstetrics and Gynecology

## 2023-06-28 NOTE — Telephone Encounter (Signed)
 Patient left a voicemail to cancel appointment 7/9. Appointment cancelled and patient notified via voicemail.

## 2023-06-29 ENCOUNTER — Ambulatory Visit: Admitting: Occupational Therapy

## 2023-07-24 ENCOUNTER — Ambulatory Visit: Admitting: Dermatology

## 2023-07-26 ENCOUNTER — Ambulatory Visit: Payer: Medicare Other

## 2023-07-26 NOTE — Progress Notes (Signed)
 [facility] Follow Up Visit   Patient Name: Abigail Walker, female   Patient DOB: 03-05-1945 Date of Service: 07/26/2023  Patient MRN: [id number] Provider Creating Note: Woodward Brought, MD  [phone] Primary Care Physician: Lenon Layman ORN, MD   [address] [place] [address] Additional Physicians/ Providers:   Impression/Recommendations   Abigail Walker is a 78 y.o. female with hypertension, diabetes mellitus type II, CVA, charles bonnet syndrome, GERD, gout, hyperlipidemia, hearing loss, lichen planus, MGUS, history of hepatitis B and sleep apnea who is following up today for chronic kidney disease stage IIIB. Creatinine 1.9, GFR of 27.   Chronic Kidney Disease stage IIIB: with proteinuria and hyperkalemia: potassium currently at goal. CKD secondary to hypertension and diabetes.  - Continue lisinopril  5mg  daily. Higher dose gives hyperkalemia.  - Continue empagliflozin 25mg  daily for renal protection.  - not currently on a mineralocorticoid receptor antagonist due to history of hyperkalemia.  - Avoid nonsteroidal anti-inflammatory agents.   Hypertension with chronic kidney disease: 125/70 - Current regimen of lisinopril  and torsemide   - Continue home blood pressure readings - Low salt diet, DASH diet  Diabetes mellitus type II with chronic kidney disease: insulin  dependent. Hemoglobin A1c of 6.8% on 06/22/23. Followed by endocrinology.  - empagliflozin as above.   Leukopenia: has been stable.   Hypercalcemia: calcium  currently at goal.  - currently off cholecalciferol  or any other vitamin D  or calcium  agents.   GERD - may try an over the counter H2 blocker.    Patient Active Problem List  Diagnosis  . Chronic kidney disease, Stage IV (severe) (HCC)  . Proteinuria  . Hypertensive chronic kidney disease, benign, with chronic kidney disease stage I through stage IV, or unspecified  . Type 2 diabetes mellitus with diabetic chronic kidney disease (HCC)  .  Hyposmolality and/or hyponatremia  . Secondary hyperparathyroidism of renal origin (HCC)  . Hypercalcemia    Orders Placed This Encounter  . PTH, Intact  . Renal Function Panel  . CBC and Differential  . Urinalysis, Complete w/reflex to Culture  . Protein, Total, Random Urine w/Creatinine (Protein/Creat Ratio)       Return in about 6 months (around 01/26/2024).  Chief Complaint   Chief Complaint  Patient presents with  . Follow-up    History of Present Illness   Ms. Abigail Walker presents for follow up. Patient presents by herself. Patient has a few complaints.   Patient complains of gastroesophageal reflux with her breakfast. This is giving her nausea and vomiting.   Patient also is having perineal pain. She feels a lump there.   Patient states she is taking all her medications as prescribed. She states her glucose readings are at goal .   Patient denies any lower extremity swelling. She states her blood pressure is well controlled.   Medications   Current Outpatient Medications:  .  acetaminophen  (TYLENOL ) 500 MG tablet, Take 1 tablet by mouth if needed    , Disp: , Rfl:  .  buPROPion  XL (WELLBUTRIN  XL) 150 MG 24 hr tablet, Take 3 tablet po QD as directed, Disp: , Rfl:  .  cholecalciferol  (D3) 50 MCG (2000 UT) tablet, Take 2,000 Units by mouth 1 (one) time each day, Disp: , Rfl:  .  clobetasol  (TEMOVATE ) 0.05 % ointment, Apply topically 1 (one) time each day, Disp: , Rfl:  .  clopidogrel  (PLAVIX ) 75 MG tablet, Take 75 mg by mouth 1 (one) time each day, Disp: , Rfl:  .  Docusate Sodium  (DSS) 100 MG capsule, Take 100 mg by mouth 1 (one) time each day, Disp: , Rfl:  .  Empagliflozin (Jardiance) 25 MG tablet, Take 25 mg by mouth 1 (one) time each day in the morning, Disp: 30 tablet, Rfl: 11 .  ferrous sulfate  325 (65 Fe) MG tablet, Take 325 mg by mouth daily, Disp: , Rfl:  .  insulin  glargine (LANTUS ) 100 UNIT/ML injection, Inject 15 Units under the skin 1 (one) time each  day, Disp: , Rfl:  .  levothyroxine  (SYNTHROID , LEVOTHROID) 88 MCG tablet, Take 75 mcg by mouth in the morning. Do not take on Sunday., Disp: , Rfl:  .  lisinopril  5 MG tablet, Take 1 tablet (5 mg total) by mouth in the morning., Disp: 30 tablet, Rfl: 11 .  NovoLOG  100 UNIT/ML solution, Inject 6-8 Units as directed in the morning and 6-8 Units in the evening. Inject before meals., Disp: , Rfl:  .  PARoxetine  (PAXIL ) 20 MG tablet, Take 20 mg by mouth 1 (one) time each day in the morning, Disp: , Rfl:  .  rosuvastatin  (CRESTOR ) 40 MG tablet, Take 40 mg by mouth daily, Disp: , Rfl:  .  torsemide  (DEMADEX ) 10 MG tablet, Take 1 tablet by mouth once daily (Patient taking differently: Take 10 mg by mouth every other day), Disp: 90 tablet, Rfl: 0   Allergies Metformin and related and Sulfa antibiotics  History Past Medical History:  Diagnosis Date  . Anxiety   . Cerebral artery occlusion, unspecified, with cerebral infarction (HCC)    behind right ear   . Abigail Walker syndrome   . Chronic kidney disease, stage 3b 01/16/2019  . Endometrial carcinoma (HCC)   . Gastroesophageal reflux disease   . Gout   . Hearing loss   . Hypercalcemia 05/01/2019  . Hyperlipidemia   . Hypertensive chronic kidney disease, benign, with chronic kidney disease stage I through stage IV, or unspecified 01/16/2019  . Hyposmolality and/or hyponatremia 01/16/2019  . Hypothyroidism   . Lichen planus   . Major depressive disorder   . Monoclonal gammopathy of undetermined significance (MGUS)   . Proteinuria 01/16/2019  . Seasonal allergic rhinitis   . Secondary hyperparathyroidism of renal origin (HCC) 01/16/2019  . Sleep apnea   . Type 2 diabetes mellitus with diabetic chronic kidney disease (HCC) 01/16/2019    Past Surgical History:  Procedure Laterality Date  . CATARACT SURGERY     both eyes  . HEMORRHOID SURGERY    . HYSTERECTOMY  12/13/2021   [facility]: Dr. Elby  . TONSILLECTOMY     History reviewed. No  pertinent family history. Social History   Tobacco Use  . Smoking status: Former    Current packs/day: 0.00    Types: Cigarettes    Quit date: 01/17/1993    Years since quitting: 30.5  . Smokeless tobacco: Never  . Tobacco comments:    Smoking History Info:Every day  Substance Use Topics  . Alcohol use: Not Currently     Physical Exam  Vitals BP 130/65 (BP Location: Right upper arm, Patient Position: Standing)   Pulse 68   Temp 97.8 F   Wt 131 lb 9.6 oz (59.7 kg)   SpO2 99%   BMI 24.87 kg/m   Vitals reviewed. Constitutional: She is oriented to person, place, and time.  HEENT:  Right Ear: Hearing normal.  Left Ear: Hearing normal.  Nose: Nose normal. Mouth/Throat: Oropharynx is clear and moist.  Eyes: Conjunctivae are normal. Pupils are equal, round,  and reactive to light.  Cardiovascular:  Normal rate, regular rhythm and intact distal pulses.           Pulmonary/Chest: Effort normal and breath sounds normal.  Abdominal: Soft. Bowel sounds are normal.  Neurological: She is alert and oriented to person, place, and time.  Skin: Skin is warm and dry.  Psychiatric: She has a normal mood and affect. Her behavior is normal. Judgment normal.     Laboratory Studies  Chemistry  Lab Units 06/22/23 1337 03/28/23 1340 01/26/23 1527 01/05/23 1041 12/28/22 1354 12/01/22 1440 09/01/22 1056 05/31/22 1331 03/08/22 1058 03/03/22 1046 03/03/22 1046 12/14/21 1028 12/14/21 0403 11/22/21 1327 08/26/21 1404  SODIUM mmol/L 144 142 140 140 140 140 139 142 140   < > 140  --   --  141 139  POTASSIUM mmol/L 4.6 5.1 5.0 4.7 5.3* 4.9 4.9 5.1 5.0   < > 5.7*  --   --  4.2 4.8  CHLORIDE mmol/L 108 110* 104 105 108 104 104 105 106   < > 106  --   --  106 106  CO2 mmol/L 32.0 29.8 27 29  29.9 28 27  30.8 28   < > 26  --   --  29.3 28.8  ANION GAP  8.6 7.3  --   --  7.4  --   --   --   --   --   --   --   --   --  9.0  MAGNESIUM  mg/dL  --   --   --   --   --   --  2.0  --  2.0  --   --  2.3  1.7*  --   --   CALCIUM  mg/dL 9.5 9.5 9.8 9.7 9.5 10 10.6* 10.1 9.9   < > 10.2  --   --  9.1 9.7  PHOSPHORUS mg/dL  --   --   --  4.1  --   --  4.2  --  4.7*  --  4.0  --   --   --   --   ALK PHOS U/L  --   --  66  --   --  72  --  70  --   --   --   --   --  73  --   PTH pg/mL  --   --   --  91*  --   --  40  --   --   --  66  --   --   --   --   URIC ACID mg/dL  --   --   --   --   --   --  5.0  --   --   --   --   --   --   --   --   GLUCOSE mg/dL 854* 59* 74 754* 819* 811* 143* 145* 139*   < > 177*  --   --  132* 136*  ALBUMIN g/dL  --   --  3.9 3.8  --  4.2 4.0 4.2 4.0   < > 4.0  --   --  3.9  --   BUN mg/dL 37* 27* 53* 47* 34* 50* 33* 58* 57*   < > 49*  --   --  22 25  CREATININE mg/dL 1.9* 1.5* 8.10* 8.10* 1.7* 2* 1.64* 1.6* 1.42*   < > 1.32*  --   --  1.2* 1.2*  HEMOGLOBIN A1C % 6.8*  --   --   --   --   --   --   --   --   --   --   --   --   --   --    < > = values in this interval not displayed.        No lab exists for component: IRON SATURATION, TRANSSATPER  CBC  Lab Units 01/26/23 1527 01/05/23 1041 09/01/22 1056 03/03/22 1046  WBC AUTO Thousand/uL 3.4* 2.2* 4.2 3.1*  HEMOGLOBIN g/dL 87.9 87.9 87.1 87.0  HEMOGLOBIN URINE   --  NEGATIVE NEGATIVE NEGATIVE  HEMATOCRIT % 36.8 38.0 39.1 38.0  MCV fL 92.7 94.5 92.4 89.8  PLATELETS AUTO Thousand/uL 125* 128* 113* 128*    Urine  Lab Units 06/22/23 1337 01/05/23 1041 09/01/22 1056 03/03/22 1046  COLOR U   --  YELLOW YELLOW DARK YELLOW  KETONES U MG/DL   --  NEGATIVE NEGATIVE TRACE*  PROT/CREAT RATIO UR mg/g creat  --  0.703*  703* 0.477*  477* 0.306*  306*  ALB MG/G CREAT UR ug/mg 138.9*  --   --   --         No lab exists for component: CYCLOSPORITR     Woodward Brought, MD  [employer], [place]

## 2023-08-02 NOTE — Progress Notes (Signed)
 Abigail Walker, Abigail Walker of [place].  Although she feels down and anxious at times, she finds her garden o be a happy place.  She enjoys working on weekends.  She is considering to check in with her grandson, who lives in [place] .  She has good sleep.  She has good appetite.  She is looking for to have a banana split on her birthday.  She denies SI, HI, hallucinations.  She experiences tinnitus and headache.  She agrees to reach her primary care provider if any concern about these symptoms.   Substance use   Tobacco Alcohol Other substances/  Current denies denies denies  Past Smoked 20 years in the past, last in 1990's Denies (hated alcohol due to her mother's history of alcohol use) denies  Past Treatment            Support: Household: by herself Marital status: divorced after ten years of marriage,  until 1979 Number of children: 1 daughter (deceased from cancer. She used to have issues with drug).  She was unable to see her grandchildren for 10 years since the loss of her daughter, although she reconnected recently Employment: retired, Public librarian in the past Education: High school. Received six months of nurse's training at age 64 She states that her parents did not pay attention to her.  She never had hugs, kisses except from her maternal grandmother.  Her mother abused alcohol, and she raised myself.    Visit Diagnosis:    ICD-10-CM   1. MDD (major depressive disorder), recurrent, in partial remission (HCC)  F33.41     2. Anxiety state  F41.1       Past Psychiatric History: Please see initial evaluation for  full details. I have reviewed the history. No updates at this time.     Past Medical History:  Past Medical History:  Diagnosis Date   Adult hypothyroidism 06/16/2013   Anemia    Aortic atherosclerosis (HCC)    Arthritis    Benign hypertension 06/16/2013   Carotid artery disease (HCC)    a.) carotid US  02/10/2020: 1-39% [facility]; b.) carotid US  09/22/2021: 1-39% [facility]   Carlin Abrahams syndrome    CKD (chronic kidney disease), stage III (HCC)    Coronary artery calcification seen on CT scan    Depression    Esophageal spasm    Esophageal stricture    GERD (gastroesophageal reflux disease)    HBV (hepatitis B virus) infection    High blood pressure    HLD (hyperlipidemia)    Hypothyroidism    LBBB (left bundle branch block)    Lichen planus    Long term current use of antithrombotics/antiplatelets    a.) clopidogrel    Mixed incontinence    OSA on CPAP 06/16/2013   Pneumonia 2023   RLS (restless legs syndrome)    Secondary hyperparathyroidism of renal origin (HCC)    Skin cancer, basal cell    Sleep apnea    Stroke [facility])    Thyroid  disease    Type 2 diabetes mellitus treated with insulin  [facility])     Past Surgical  History:  Procedure Laterality Date   CATARACT EXTRACTION Bilateral    COLONOSCOPY  2009   COLONOSCOPY WITH PROPOFOL  N/A 11/19/2021   Procedure: COLONOSCOPY WITH PROPOFOL ;  Surgeon: Maryruth Ole DASEN, MD;  Location: [facility] ENDOSCOPY;  Service: Endoscopy;  Laterality: N/A;  DM   DILATATION & CURETTAGE/HYSTEROSCOPY WITH MYOSURE  10/14/2021   Procedure: MYOSURE POLYPECTOMY;  Surgeon: Verdon Keen, MD;  Location: [facility] ORS;  Service: Gynecology;;   ESOPHAGOGASTRODUODENOSCOPY     2009, 2011   ESOPHAGOGASTRODUODENOSCOPY (EGD) WITH PROPOFOL  N/A 06/08/2015   Procedure: ESOPHAGOGASTRODUODENOSCOPY (EGD) WITH PROPOFOL ;  Surgeon: Deward CINDERELLA Piedmont, MD;  Location[facility] ENDOSCOPY;  Service: Gastroenterology;  Laterality: N/A;   ESOPHAGOGASTRODUODENOSCOPY (EGD) WITH PROPOFOL  N/A 11/19/2021    Procedure: ESOPHAGOGASTRODUODENOSCOPY (EGD) WITH PROPOFOL ;  Surgeon: Maryruth Ole DASEN, MD;  Location: [facility] ENDOSCOPY;  Service: Endoscopy;  Laterality: N/A;   HEMORRHOID SURGERY     HYSTEROSCOPY WITH D & C N/A 10/14/2021   Procedure: DILATATION AND CURETTAGE /HYSTEROSCOPY;  Surgeon: Verdon Keen, MD;  Location: [facility] ORS;  Service: Gynecology;  Laterality: N/A;   IMPLANTABLE CONTACT LENS IMPLANTATION     LYSIS OF ADHESION  10/14/2021   Procedure: LYSIS OF VAGINAL ADHESIONS;  Surgeon: Verdon Keen, MD;  Location: [facility] ORS;  Service: Gynecology;;   TONSILLECTOMY     TUBAL LIGATION      Family Psychiatric History: Please see initial evaluation for full details. I have reviewed the history. No updates at this time.     Family History:  Family History  Problem Relation Age of Onset   Alcohol abuse Mother    Cancer Mother    Heart disease Father    Kidney failure Maternal Aunt     Social History:  Social History   Socioeconomic History   Marital status: Divorced    Spouse name: Not on file   Number of children: Not on file   Years of education: Not on file   Highest education level: Some college, no degree  Occupational History   Not on file  Tobacco Use   Smoking status: Former    Current packs/day: 0.00    Types: Cigarettes    Quit date: 07/27/1993    Years since quitting: 30.0   Smokeless tobacco: Never   Tobacco comments:    quit 20 years  Vaping Use   Vaping status: Never Used  Substance and Sexual Activity   Alcohol use: No    Alcohol/week: 0.0 standard drinks of alcohol   Drug use: No   Sexual activity: Not Currently    Birth control/protection: Post-menopausal  Other Topics Concern   Not on file  Social History Narrative   Lives alone   Social Drivers of Health   Financial Resource Strain: Low Risk  (06/29/2023)   Received from [facility]   Overall Financial Resource Strain (CARDIA)    Difficulty of Paying Living Expenses: Not  very hard  Food Insecurity: No Food Insecurity (06/29/2023)   Received from [facility]   Hunger Vital Sign    Within the past 12 months, you worried that your food would run out before you got the money to buy more.: Never true    Within the past 12 months, the food you bought just didn't last and you didn't have money to get more.: Never true  Transportation Needs: No Transportation Needs (06/29/2023)   Received from [facility] - Transportation    In the past 12 months, has lack of  transportation kept you from medical appointments or from getting medications?: No    Lack of Transportation (Non-Medical): No  Physical Activity: Inactive (12/01/2021)   Received from [facility]   Exercise Vital Sign    On average, how many days per week do you engage in moderate to strenuous exercise (like a brisk walk)?: 0 days    On average, how many minutes do you engage in exercise at this level?: 0 min  Stress: Not on file  Social Connections: Socially Isolated (12/01/2021)   Received from [facility]   Social Connection and Isolation Panel    In a typical week, how many times do you talk on the phone with family, friends, or neighbors?: Never    How often do you get together with friends or relatives?: Never    How often do you attend church or religious services?: Never    Do you belong to any clubs or organizations such as church groups, unions, fraternal or athletic groups, or school groups?: No    How often do you attend meetings of the clubs or organizations you belong to?: Never    Are you married, widowed, divorced, separated, never married, or living with a partner?: Divorced    Allergies:  Allergies  Allergen Reactions   Amoxicillin -Pot Clavulanate Rash   Metformin And Related Nausea And Vomiting   Sulfa Antibiotics Nausea And Vomiting   Hydrocortisone-Iodoquinol  [Hydrocortisone-Iodoquinol] Rash     Patient reports that she does not believe that she has allergy to this, that she was receiving many medications at once.    Metabolic Disorder Labs: Lab Results  Component Value Date   HGBA1C 6.6 (H) 10/27/2021   MPG 142.72 10/27/2021   MPG 145.59 08/11/2021   No results found for: PROLACTIN No results found for: CHOL, TRIG, HDL, CHOLHDL, VLDL, LDLCALC Lab Results  Component Value Date   TSH 0.061 (L) 10/27/2021   TSH 11.315 (H) 01/09/2020    Therapeutic Level Labs: No results found for: LITHIUM No results found for: VALPROATE No results found for: CBMZ  Current Medications: Current Outpatient Medications  Medication Sig Dispense Refill   acetaminophen  (TYLENOL ) 500 MG tablet Take 500 mg by mouth every 6 (six) hours as needed for moderate pain.     buPROPion  (WELLBUTRIN  XL) 150 MG 24 hr tablet Take 3 tablets (450 mg total) by mouth daily. 270 tablet 0   Cholecalciferol  (VITAMIN D3) 50 MCG (2000 UT) TABS Take 2 tablets by mouth daily.     clobetasol  ointment (TEMOVATE ) 0.05 % APPLY ONE APPLICATION TOPICALLY DAILY. VAGINA 60 g 0   clopidogrel  (PLAVIX ) 75 MG tablet Take 1 tablet (75 mg total) by mouth at bedtime. 30 tablet 0   cyanocobalamin (VITAMIN B12) 1000 MCG tablet Take 1,000 mcg by mouth daily.     docusate sodium  (COLACE) 100 MG capsule Take 1 capsule (100 mg total) by mouth daily. 30 capsule 0   empagliflozin (JARDIANCE) 25 MG TABS tablet Take by mouth.     ferrous sulfate  325 (65 FE) MG tablet Take 1 tablet (325 mg total) by mouth daily with breakfast. (Patient taking differently: Take 325 mg by mouth daily.) 30 tablet 0   insulin  glargine (LANTUS ) 100 UNIT/ML injection Inject 0.15 mLs (15 Units total) into the skin at bedtime. (Patient taking differently: Inject 15 Units into the skin daily. morning) 10 mL 0   Insulin  Syringe-Needle U-100 (INSULIN  SYRINGE 1CC/31GX5/16) 31G X 5/16 1 ML MISC USE ONE SYRINGE TWICE DAILY  AS DIRECTED WITH INSULIN  VIALS      levothyroxine  (SYNTHROID ) 75 MCG tablet Take 1 tablet (75 mcg total) by mouth daily before breakfast. (Patient taking differently: Take 20 mcg by mouth daily before breakfast.) 30 tablet 0   lidocaine -prilocaine  (EMLA ) cream Apply 1 Application topically as needed. To your vulva for pain control 30 g 0   lisinopril  (ZESTRIL ) 20 MG tablet Take 10 mg by mouth daily.     lisinopril  (ZESTRIL ) 5 MG tablet Take 5 mg by mouth every morning.     NOVOLOG  100 UNIT/ML injection Inject 6 Units into the skin in the morning and at bedtime. (Patient taking differently: Inject 4-6 Units into the skin in the morning and at bedtime. 4 units in the morning and 6 units at bedtime) 10 mL 0   nystatin cream (MYCOSTATIN) Apply 1 application topically as needed (yeast).     PARoxetine  (PAXIL ) 20 MG tablet Take 1 tablet (20 mg total) by mouth daily. 90 tablet 0   rosuvastatin  (CRESTOR ) 40 MG tablet Take 1 tablet (40 mg total) by mouth at bedtime. 90 tablet 0   torsemide  (DEMADEX ) 10 MG tablet Take 1 tablet (10 mg total) by mouth every other day. 30 tablet 0   No current facility-administered medications for this visit.     Musculoskeletal: Strength & Muscle Tone: normal Gait & Station: normal Patient leans: N/A  Psychiatric Specialty Exam: Review of Systems  Psychiatric/Behavioral:  Positive for dysphoric mood. Negative for agitation, behavioral problems, confusion, decreased concentration, hallucinations, self-injury, sleep disturbance and suicidal ideas. The patient is nervous/anxious. The patient is not hyperactive.   All other systems reviewed and are negative.   Blood pressure (!) 143/84, pulse 80, temperature (!) 96.5 F (35.8 C), temperature source Temporal, height 5' (1.524 m), weight 132 lb (59.9 kg).Body mass index is 25.78 kg/m.  General Appearance: Well Groomed  Eye Contact:  Good  Speech:  Clear and Coherent  Volume:  Normal  Mood:  good  Affect:  Appropriate, Congruent, and Full Range   Thought Process:  Coherent  Orientation:  Full (Time, Place, and Person)  Thought Content: Logical   Suicidal Thoughts:  No  Homicidal Thoughts:  No  Memory:  Immediate;   Good  Judgement:  Good  Insight:  Good  Psychomotor Activity:  Normal  Concentration:  Concentration: Good and Attention Span: Good  Recall:  Good  Fund of Knowledge: Good  Language: Good  Akathisia:  No  Handed:  Right  AIMS (if indicated): not done  Assets:  Communication Skills Desire for Improvement  ADL's:  Intact  Cognition: WNL  Sleep:  Fair   Screenings: GAD-7    Flowsheet Row [facility] Visit from [facility]/13/2023 in [facility]  Total GAD-7 Score 4   PHQ2-9    Flowsheet Row Office Visit from 06/13/2023 in [facility] Office Visit from 06/06/2022 in [facility]  PHQ-2 Total Score 1 1  PHQ-9 Total Score -- 5   Flowsheet Row ED from 10/30/2022 in [facility] Admission (Discharged) from 10/14/2021 in [facility] from 08/10/2021 in [facility] at [facility]  C-SSRS RISK CATEGORY No Risk No Risk No Risk     Assessment and Plan:  SKILER TYE is a 78 y.o. year old female with a history of depression, endometrial adenocarcinoma s/p hysterectomy and bilateral SPO, not on chemotherapy or radiation secondary  to lichen planus, right occipital infarcts in 2021, cognitive improvements, type II diabetes, hypertension, hyperlipidemia, LBBB, who presents for follow up appointment for below.  1. MDD (major depressive disorder), recurrent, in partial remission (HCC) 2. Anxiety state Acute stressors include: financial strain in the setting of buying a car, loss of her close friend  Other stressors include: absence of nurturing except her maternal grandfather (her mother had alcohol use  issues) History: depression since child, originally on Paxil  20 mg daily, bupropion  450 mg daily     The exam is notable for bright affect, and she reports overall stable mood since the last visit.  Although she continues to feel down and anxious at times, she enjoys working on the yard work.  Will continue current medication regimen.  Will continue Paxil  to target depression and anxiety.  Will continue bupropion  as adjunctive treatment for depression. Although she will greatly benefit from CBT, she is not interested in this due to the past experience.    3. Neurocognitive deficits # Hallucinations Functional Status   IADL: Independent in the following: managing finances, medications, driving, cooking (only occasionally)           Requires assistance with the following:  ADL  Independent in the following: bathing and hygiene, feeding, continence, grooming and toileting, walking          Requires assistance with the following: Folate, Vitamin B12 (wnl 11/2022), TSH wnl 04/2022 Images Brain: Chronic white matter ischemic change. No evidence of acute infarction, hemorrhage, hydrocephalus, extra-axial collection or mass lesion/mass effect. Neuropsych assessment: SLUMS 27 on 11/2021, MOCA 22/30 09/2022 Etiology: VaD    No signifciant change. IADL is independent, although she does have some validity deficits as evidenced by MoCA. She has a history of chronic hallucinations, likely secondary to NPS, and she denies significant concern about this.  Will not start antipsychotics at this time given its risk outweighs benefit.   Plan  Continue Paxil  20 mg daily Continue bupropion  450 mg daily   Next appointment: 9/11 at 3 pm, IP   The patient demonstrates the following risk factors for suicide: Chronic risk factors for suicide include: psychiatric disorder of depression . Acute risk factors for suicide include: unemployment and loss (financial, interpersonal, professional). Protective factors for this  patient include: coping skills and hope for the future. Considering these factors, the overall suicide risk at this point appears to be low. Patient is appropriate for outpatient follow up.   Collaboration of Care: Collaboration of Care: Other reviewed notes in Epic  Patient/Guardian was advised Release of Information must be obtained prior to any record release in order to collaborate their care with an outside provider. Patient/Guardian was advised if they have not already done so to contact the registration department to sign all necessary forms in order for us  to release information regarding their care.   Consent: Patient/Guardian gives verbal consent for treatment and assignment of benefits for services provided during this visit. Patient/Guardian expressed understanding and agreed to proceed.    Katheren Sleet, MD 08/07/2023, 11:07 AM

## 2023-08-07 ENCOUNTER — Other Ambulatory Visit: Payer: Self-pay

## 2023-08-07 ENCOUNTER — Ambulatory Visit (INDEPENDENT_AMBULATORY_CARE_PROVIDER_SITE_OTHER): Admitting: Psychiatry

## 2023-08-07 ENCOUNTER — Encounter: Payer: Self-pay | Admitting: Psychiatry

## 2023-08-07 VITALS — BP 143/84 | HR 80 | Temp 96.5°F | Ht 60.0 in | Wt 132.0 lb

## 2023-08-07 DIAGNOSIS — F411 Generalized anxiety disorder: Secondary | ICD-10-CM

## 2023-08-07 DIAGNOSIS — F3341 Major depressive disorder, recurrent, in partial remission: Secondary | ICD-10-CM

## 2023-08-07 NOTE — Patient Instructions (Signed)
 Continue Paxil  20 mg daily Continue bupropion  450 mg daily   Next appointment: 9/11 at 3 pm

## 2023-08-30 ENCOUNTER — Ambulatory Visit: Admitting: Dermatology

## 2023-09-21 ENCOUNTER — Ambulatory Visit (INDEPENDENT_AMBULATORY_CARE_PROVIDER_SITE_OTHER): Payer: Medicare (Managed Care) | Admitting: Vascular Surgery

## 2023-09-21 ENCOUNTER — Ambulatory Visit (INDEPENDENT_AMBULATORY_CARE_PROVIDER_SITE_OTHER): Payer: Medicare (Managed Care)

## 2023-09-21 ENCOUNTER — Encounter (INDEPENDENT_AMBULATORY_CARE_PROVIDER_SITE_OTHER): Payer: Self-pay | Admitting: Vascular Surgery

## 2023-09-21 VITALS — BP 134/59 | HR 76 | Ht 60.0 in | Wt 133.6 lb

## 2023-09-21 DIAGNOSIS — E1151 Type 2 diabetes mellitus with diabetic peripheral angiopathy without gangrene: Secondary | ICD-10-CM | POA: Diagnosis not present

## 2023-09-21 DIAGNOSIS — I739 Peripheral vascular disease, unspecified: Secondary | ICD-10-CM

## 2023-09-21 DIAGNOSIS — I6523 Occlusion and stenosis of bilateral carotid arteries: Secondary | ICD-10-CM

## 2023-09-21 DIAGNOSIS — I10 Essential (primary) hypertension: Secondary | ICD-10-CM

## 2023-09-21 DIAGNOSIS — N183 Chronic kidney disease, stage 3 unspecified: Secondary | ICD-10-CM

## 2023-09-21 DIAGNOSIS — E782 Mixed hyperlipidemia: Secondary | ICD-10-CM

## 2023-09-22 NOTE — Progress Notes (Signed)
 No chief complaint on file.   HPI  Abigail Walker is a 78 y.o. here for an acute issue.  She has a PMH of HTN, DM2, OSA/CPAP, anxiety/depression, hypothyroidism, atherosclerotic disease aorta and carotid who describes a couple days of abdominal pain, intermittent, sharp at times mostly in the left lower quadrant.  Has also had a couple vomiting spells though not directly related.  History of hiatal hernia.  Bowels did move today.  No fevers or chills.  Stable appetite.  Has been active.    ROS  Pertinent items are noted in HPI.  Outpatient Encounter Medications as of 09/22/2023  Medication Sig Dispense Refill  . buPROPion  (WELLBUTRIN  XL) 150 MG XL tablet Take 3 tablets (450 mg total) by mouth once daily Take 3 tablet po QD as directed (Patient taking differently: Take 450 mg by mouth once daily) 270 tablet 3  . cholecalciferol  (VITAMIN D3) 2,000 unit tablet Take by mouth    . clobetasoL  (TEMOVATE ) 0.05 % ointment Apply topically once daily Apply 1 application topically daily. vagina 60 g 11  . clopidogreL  (PLAVIX ) 75 mg tablet Take 1 tablet by mouth once daily 90 tablet 3  . ferrous sulfate  325 (65 FE) MG tablet Take 325 mg by mouth once daily       . insulin  ASPART (NOVOLOG ) injection (concentration 100 units/mL) INJECT 8 UNITS BEFORE MORNING MEAL AND 8 UNITS BEFORE SUPPER MEAL.  DISCARD AFTER 30 DAYS.  DISPENSE ONE VIAL PER MONTH. 10 mL 11  . insulin  syringe-needle U-100 (BD INSULIN  SYRINGE ULTRA-FINE) 1 mL 31 gauge x 5/16 syringe 3 (three) times daily As instructed with insulin . 300 each 3  . LANTUS  U-100 INSULIN  injection (concentration 100 units/mL) Inject 15 Units subcutaneously once daily 30 mL 3  . levothyroxine  (SYNTHROID ) 75 MCG tablet Take 1 tablet (75 mcg total) by mouth once daily Take on an empty stomach with a glass of water at least 30-60 minutes before breakfast. 90 tablet 1  . lisinopriL  (ZESTRIL ) 5 MG tablet Take 5 mg by mouth every morning    . nystatin (MYCOSTATIN) 100,000  unit/gram cream Apply topically 2 (two) times daily as needed 30 g 2  . ONETOUCH DELICA PLUS LANCET 3 (three) times daily Use as instructed. 100 each 5  . PARoxetine  (PAXIL ) 20 MG tablet Take 1 tablet (20 mg total) by mouth once daily 30 tablet 5  . rosuvastatin  (CRESTOR ) 40 MG tablet Take 1 tablet by mouth once daily 90 tablet 1  . TORsemide  (DEMADEX ) 10 MG tablet TAKE 1 TABLET BY MOUTH EVERY OTHER DAY 45 tablet 1  . empagliflozin (JARDIANCE) 25 mg tablet Take 25 mg by mouth once daily    . [DISCONTINUED] TORsemide  (DEMADEX ) 10 MG tablet Take 1 tablet (10 mg total) by mouth every other day Every other day 45 tablet 3   No facility-administered encounter medications on file as of 09/22/2023.    Allergies as of 09/22/2023 - Reviewed 08/18/2023  Allergen Reaction Noted  . Amoxicillin -pot clavulanate Rash 07/28/2014  . Metformin Nausea 02/11/2020  . Other Other (See Comments) 11/08/2010  . Sulfa (sulfonamide antibiotics) Unknown 08/08/2013  . Sulfasalazine Nausea And Vomiting 07/28/2014  . Alcortin a [hydrocortisone-iodoquinl-aloe2] Rash 03/16/2017  . Hydrocortisone-iodoquinol Rash 03/16/2017    Past Medical History:  Diagnosis Date  . Anesthesia complication   . Aortic atherosclerosis ()    on CT scan 11/2021  . Depression   . Endometrial adenocarcinoma (CMS/HHS-HCC) 09/2021  . Essential hypertension, benign   . GERD (gastroesophageal reflux  disease)    Associated with severe nausea  . Hepatitis B   . Hyperlipidemia   . Hypothyroidism   . Lichen planus   . Restless leg syndrome   . Secondary hyperparathyroidism, renal (CMS/HHS-HCC)   . Skin cancer, basal cell 01/17/2009   Excision  . Sleep apnea   . Type 2 diabetes mellitus (CMS/HHS-HCC) 01/17/1990   complicated by microalbuminuria and diabetic neuropathy    Past Surgical History:  Procedure Laterality Date  . COLONOSCOPY  02/22/2007  . EGD  02/22/2007  . EGD  06/08/2015   Reflux Esophagitis/No Repeat/MUS  . Colon @  [facility]  11/19/2021   Normal examined colon/No repeat due to age/CTL  . EGD @ [facility]  11/19/2021   Biopsies from EGD unremarkable. If persistent dysphagia, would perform barium swallow/Repeat PRN/CTL  . ROBOT ASSISTED LAPAROSCOPIC SURGERY WITH TOTAL HYSTERECTOMY Bilateral 12/13/2021   Procedure: *ROBOT* ASSISTED LAPAROSCOPY, SURGICAL; WITH TOTAL HYSTERECTOMY, FOR UTERUS 250 G OR LESS; WITH REMOVAL OF TUBE(S) AND / OR OVARY(S);  Surgeon: Elby Webb Loges, MD;  Location: [facility] OR;  Service: Gynecology;  Laterality: Bilateral;  . LAPAROSCOPY W/RETROPERITONEAL LYMPH NODE SAMPLING Bilateral 12/13/2021   Procedure: LAPAROSCOPY, SURGICAL; WITH RETROPERITONEAL LYMPH NODE SAMPLING (BIOPSY), SINGLE OR MULTIPLE;  Surgeon: Elby Webb Loges, MD;  Location: [facility] OR;  Service: Gynecology;  Laterality: Bilateral;  . INJECTION FOR IDENTIFICATION SENTINEL NODE GROIN Bilateral 12/13/2021   Procedure: INJECTION PROCEDURE; RADIOACTIVE TRACER FOR IDENTIFICATION OF SENTINEL NODE GROIN;  Surgeon: Elby Webb Loges, MD;  Location: [facility] OR;  Service: Gynecology;  Laterality: Bilateral;  . PELVIC EXAMINATION UNDER ANESTHESIA N/A 12/13/2021   Procedure: PELVIC EXAMINATION UNDER ANESTHESIA (OTHER THAN LOCAL);  Surgeon: Elby Webb Loges, MD;  Location: [facility] OR;  Service: Gynecology;  Laterality: N/A;  . Basal cell skin cancer excision    . DILATION AND CURETTAGE OF UTERUS    . EXTRACTION TEETH    . Hemorrhoid surgery    . HYSTEROSCOPY    . lysis of vaginal adhesions    . polypectomy    . TONSILLECTOMY    . TUBAL LIGATION Bilateral     There were no vitals filed for this visit.  Physical Exam  General. Well appearing; NAD; VS reviewed     HEENT: Sclera and conjunctiva clear; EOMI,  Lungs. Respirations unlabored; clear to auscultation bilaterally. Cardiovascular. Heart regular rate and rhythm without murmurs, gallops, or rubs. Abdomen:  Soft, mild left lower quadrant  tenderness without rebound or guarding. Extremities:  No edema. Skin. Normal color and turgor Neurologic. Alert and oriented x3   Assessment and Plan 1. LLQ pain Mild tenderness without rebound or guarding.  Check labs, urine.  Possible constipation.  Consider mild diverticulitis.  Follow-up depending on results. -     Urinalysis w/Microscopic -     Urine Culture, Routine - [facility] -     CBC w/auto Differential (5 Part) -     Comprehensive Metabolic Panel (CMP) -     X-ray abdomen 3 way series includes 1 view chest; Future  2. Type 2 diabetes mellitus with stage 3b chronic kidney disease, with long-term current use of insulin  (CMS/HHS-HCC)   3. HTN (hypertension), benign     I have personally performed this service.  [id number] [place], [place]

## 2023-09-24 NOTE — Progress Notes (Unsigned)
 [facility] MD/PA/NP OP Progress Note  09/24/2023 4:40 PM Abigail Walker  MRN:  [id number]  Chief Complaint: No chief complaint on file.  HPI: ***   Substance use   Tobacco Alcohol Other substances/  Current denies denies denies  Past Smoked 20 years in the past, last in 1990's Denies (hated alcohol due to her mother's history of alcohol use) denies  Past Treatment            Support: Household: by herself Marital status: divorced after ten years of marriage,  until 1979 Number of children: 1 daughter (deceased from cancer. She used to have issues with drug).  She was unable to see her grandchildren for 10 years since the loss of her daughter, although she reconnected recently Employment: retired, Public librarian in the past Education: High school. Received six months of nurse's training at age 1 She states that her parents did not pay attention to her.  She never had hugs, kisses except from her maternal grandmother.  Her mother abused alcohol, and she raised myself.  Visit Diagnosis: No diagnosis found.  Past Psychiatric History: Please see initial evaluation for full details. I have reviewed the history. No updates at this time.     Past Medical History:  Past Medical History:  Diagnosis Date   Adult hypothyroidism 06/16/2013   Anemia    Aortic atherosclerosis (HCC)    Arthritis    Benign hypertension 06/16/2013   Carotid artery disease (HCC)    a.) carotid US  02/10/2020: 1-39% [facility]; b.) carotid US  09/22/2021: 1-39% BICA   Carlin Abrahams syndrome    CKD (chronic kidney disease), stage III (HCC)    Coronary artery calcification seen on CT scan    Depression    Esophageal spasm    Esophageal stricture    GERD (gastroesophageal reflux disease)    HBV (hepatitis B virus) infection    High blood pressure    HLD (hyperlipidemia)    Hypothyroidism    LBBB (left bundle branch block)    Lichen planus    Long term current use of antithrombotics/antiplatelets    a.)  clopidogrel    Mixed incontinence    OSA on CPAP 06/16/2013   Pneumonia 2023   RLS (restless legs syndrome)    Secondary hyperparathyroidism of renal origin (HCC)    Skin cancer, basal cell    Sleep apnea    Stroke (HCC)    Thyroid  disease    Type 2 diabetes mellitus treated with insulin  [facility])     Past Surgical History:  Procedure Laterality Date   CATARACT EXTRACTION Bilateral    COLONOSCOPY  2009   COLONOSCOPY WITH PROPOFOL  N/A 11/19/2021   Procedure: COLONOSCOPY WITH PROPOFOL ;  Surgeon: Maryruth Ole ONEIDA, MD;  Location: [facility] ENDOSCOPY;  Service: Endoscopy;  Laterality: N/A;  DM   DILATATION & CURETTAGE/HYSTEROSCOPY WITH MYOSURE  10/14/2021   Procedure: MYOSURE POLYPECTOMY;  Surgeon: Verdon Keen, MD;  Location: [facility] ORS;  Service: Gynecology;;   ESOPHAGOGASTRODUODENOSCOPY     2009, 2011   ESOPHAGOGASTRODUODENOSCOPY (EGD) WITH PROPOFOL  N/A 06/08/2015   Procedure: ESOPHAGOGASTRODUODENOSCOPY (EGD) WITH PROPOFOL ;  Surgeon: Deward CINDERELLA Piedmont, MD;  Location: [facility] ENDOSCOPY;  Service: Gastroenterology;  Laterality: N/A;   ESOPHAGOGASTRODUODENOSCOPY (EGD) WITH PROPOFOL  N/A 11/19/2021   Procedure: ESOPHAGOGASTRODUODENOSCOPY (EGD) WITH PROPOFOL ;  Surgeon: Maryruth Ole ONEIDA, MD;  Location: [facility] ENDOSCOPY;  Service: Endoscopy;  Laterality: N/A;   HEMORRHOID SURGERY     HYSTEROSCOPY WITH D & C N/A 10/14/2021   Procedure: DILATATION AND CURETTAGE /HYSTEROSCOPY;  Surgeon:  Verdon Keen, MD;  Location: [facility] ORS;  Service: Gynecology;  Laterality: N/A;   IMPLANTABLE CONTACT LENS IMPLANTATION     LYSIS OF ADHESION  10/14/2021   Procedure: LYSIS OF VAGINAL ADHESIONS;  Surgeon: Verdon Keen, MD;  Location: [facility] ORS;  Service: Gynecology;;   TONSILLECTOMY     TUBAL LIGATION      Family Psychiatric History: Please see initial evaluation for full details. I have reviewed the history. No updates at this time.     Family History:  Family History  Problem Relation Age of Onset   Alcohol abuse  Mother    Cancer Mother    Heart disease Father    Kidney failure Maternal Aunt     Social History:  Social History   Socioeconomic History   Marital status: Divorced    Spouse name: Not on file   Number of children: Not on file   Years of education: Not on file   Highest education level: Some college, no degree  Occupational History   Not on file  Tobacco Use   Smoking status: Former    Current packs/day: 0.00    Types: Cigarettes    Quit date: 07/27/1993    Years since quitting: 30.1   Smokeless tobacco: Never   Tobacco comments:    quit 20 years  Vaping Use   Vaping status: Never Used  Substance and Sexual Activity   Alcohol use: No    Alcohol/week: 0.0 standard drinks of alcohol   Drug use: No   Sexual activity: Not Currently    Birth control/protection: Post-menopausal  Other Topics Concern   Not on file  Social History Narrative   Lives alone   Social Drivers of Health   Financial Resource Strain: Low Risk  (06/29/2023)   Received from [facility]   Overall Financial Resource Strain (CARDIA)    Difficulty of Paying Living Expenses: Not very hard  Food Insecurity: No Food Insecurity (06/29/2023)   Received from [facility]   Hunger Vital Sign    Within the past 12 months, you worried that your food would run out before you got the money to buy more.: Never true    Within the past 12 months, the food you bought just didn't last and you didn't have money to get more.: Never true  Transportation Needs: No Transportation Needs (06/29/2023)   Received from [facility] - Transportation    In the past 12 months, has lack of transportation kept you from medical appointments or from getting medications?: No    Lack of Transportation (Non-Medical): No  Physical Activity: Inactive (12/01/2021)   Received from [facility]   Exercise Vital Sign    On average, how many days per week do you  engage in moderate to strenuous exercise (like a brisk walk)?: 0 days    On average, how many minutes do you engage in exercise at this level?: 0 min  Stress: Not on file  Social Connections: Socially Isolated (12/01/2021)   Received from [facility]   Social Connection and Isolation Panel    In a typical week, how many times do you talk on the phone with family, friends, or neighbors?: Never    How often do you get together with friends or relatives?: Never    How often do you attend church or religious services?: Never    Do you belong to any clubs or organizations such as church  groups, unions, fraternal or athletic groups, or school groups?: No    How often do you attend meetings of the clubs or organizations you belong to?: Never    Are you married, widowed, divorced, separated, never married, or living with a partner?: Divorced    Allergies:  Allergies  Allergen Reactions   Amoxicillin -Pot Clavulanate Rash   Metformin And Related Nausea And Vomiting   Sulfa Antibiotics Nausea And Vomiting   Hydrocortisone-Iodoquinol  [Hydrocortisone-Iodoquinol] Rash    Patient reports that she does not believe that she has allergy to this, that she was receiving many medications at once.    Metabolic Disorder Labs: Lab Results  Component Value Date   HGBA1C 6.6 (H) 10/27/2021   MPG 142.72 10/27/2021   MPG 145.59 08/11/2021   No results found for: PROLACTIN No results found for: CHOL, TRIG, HDL, CHOLHDL, VLDL, LDLCALC Lab Results  Component Value Date   TSH 0.061 (L) 10/27/2021   TSH 11.315 (H) 01/09/2020    Therapeutic Level Labs: No results found for: LITHIUM No results found for: VALPROATE No results found for: CBMZ  Current Medications: Current Outpatient Medications  Medication Sig Dispense Refill   acetaminophen  (TYLENOL ) 500 MG tablet Take 500 mg by mouth every 6 (six) hours as needed for moderate pain.     buPROPion  (WELLBUTRIN  XL) 150  MG 24 hr tablet Take 3 tablets (450 mg total) by mouth daily. 270 tablet 0   Cholecalciferol  (VITAMIN D3) 50 MCG (2000 UT) TABS Take 2 tablets by mouth daily.     clobetasol  ointment (TEMOVATE ) 0.05 % APPLY ONE APPLICATION TOPICALLY DAILY. VAGINA 60 g 0   clopidogrel  (PLAVIX ) 75 MG tablet Take 1 tablet (75 mg total) by mouth at bedtime. 30 tablet 0   cyanocobalamin (VITAMIN B12) 1000 MCG tablet Take 1,000 mcg by mouth daily.     docusate sodium  (COLACE) 100 MG capsule Take 1 capsule (100 mg total) by mouth daily. 30 capsule 0   ferrous sulfate  325 (65 FE) MG tablet Take 1 tablet (325 mg total) by mouth daily with breakfast. (Patient taking differently: Take 325 mg by mouth daily.) 30 tablet 0   insulin  glargine (LANTUS ) 100 UNIT/ML injection Inject 0.15 mLs (15 Units total) into the skin at bedtime. (Patient taking differently: Inject 15 Units into the skin daily. morning) 10 mL 0   Insulin  Syringe-Needle U-100 (INSULIN  SYRINGE 1CC/31GX5/16) 31G X 5/16 1 ML MISC USE ONE SYRINGE TWICE DAILY AS DIRECTED WITH INSULIN  VIALS     levothyroxine  (SYNTHROID ) 75 MCG tablet Take 1 tablet (75 mcg total) by mouth daily before breakfast. (Patient taking differently: Take 20 mcg by mouth daily before breakfast.) 30 tablet 0   lidocaine -prilocaine  (EMLA ) cream Apply 1 Application topically as needed. To your vulva for pain control 30 g 0   lisinopril  (ZESTRIL ) 20 MG tablet Take 10 mg by mouth daily.     lisinopril  (ZESTRIL ) 5 MG tablet Take 5 mg by mouth every morning.     NOVOLOG  100 UNIT/ML injection Inject 6 Units into the skin in the morning and at bedtime. (Patient taking differently: Inject 4-6 Units into the skin in the morning and at bedtime. 4 units in the morning and 6 units at bedtime) 10 mL 0   nystatin cream (MYCOSTATIN) Apply 1 application topically as needed (yeast).     PARoxetine  (PAXIL ) 20 MG tablet Take 1 tablet (20 mg total) by mouth daily. 90 tablet 0   rosuvastatin  (CRESTOR ) 40 MG tablet Take  1 tablet (40  mg total) by mouth at bedtime. 90 tablet 0   torsemide  (DEMADEX ) 10 MG tablet Take 1 tablet (10 mg total) by mouth every other day. 30 tablet 0   No current facility-administered medications for this visit.     Musculoskeletal: Strength & Muscle Tone: within normal limits Gait & Station: normal Patient leans: N/A  Psychiatric Specialty Exam: Review of Systems  There were no vitals taken for this visit.There is no height or weight on file to calculate BMI.  General Appearance: {Appearance:22683}  Eye Contact:  {BHH EYE CONTACT:22684}  Speech:  Clear and Coherent  Volume:  Normal  Mood:  {BHH MOOD:22306}  Affect:  {Affect (PAA):22687}  Thought Process:  Coherent  Orientation:  Full (Time, Place, and Person)  Thought Content: Logical   Suicidal Thoughts:  {ST/HT (PAA):22692}  Homicidal Thoughts:  {ST/HT (PAA):22692}  Memory:  Immediate;   Good  Judgement:  {Judgement (PAA):22694}  Insight:  {Insight (PAA):22695}  Psychomotor Activity:  Normal  Concentration:  Concentration: Good and Attention Span: Good  Recall:  Good  Fund of Knowledge: Good  Language: Good  Akathisia:  No  Handed:  Right  AIMS (if indicated): not done  Assets:  Communication Skills Desire for Improvement  ADL's:  Intact  Cognition: WNL  Sleep:  {BHH GOOD/FAIR/POOR:22877}   Screenings: GAD-7    Flowsheet Row [facility] Visit from [facility]/13/2023 in [facility]  Total GAD-7 Score 4   PHQ2-9    Flowsheet Row Office Visit from 06/13/2023 in [facility] Office Visit from 06/06/2022 in [facility]  PHQ-2 Total Score 1 1  PHQ-9 Total Score -- 5   Flowsheet Row ED from 10/30/2022 in [facility] Department at [facility] Admission (Discharged) from 10/14/2021 in [facility] ED from 08/10/2021 in [facility] Department at  [facility]  C-SSRS RISK CATEGORY No Risk No Risk No Risk     Assessment and Plan:  AISLEY WHAN is a 78 y.o. year old female with a history of depression, endometrial adenocarcinoma s/p hysterectomy and bilateral SPO, not on chemotherapy or radiation secondary to lichen planus, right occipital infarcts in 2021, cognitive improvements, type II diabetes, hypertension, hyperlipidemia, LBBB, who presents for follow up appointment for below.   1. MDD (major depressive disorder), recurrent, in partial remission (HCC) 2. Anxiety state Acute stressors include: financial strain in the setting of buying a car, loss of her close friend  Other stressors include: absence of nurturing except her maternal grandfather (her mother had alcohol use issues) History: depression since child, originally on Paxil  20 mg daily, bupropion  450 mg daily     The exam is notable for bright affect, and she reports overall stable mood since the last visit.  Although she continues to feel down and anxious at times, she enjoys working on the yard work.  Will continue current medication regimen.  Will continue Paxil  to target depression and anxiety.  Will continue bupropion  as adjunctive treatment for depression. Although she will greatly benefit from CBT, she is not interested in this due to the past experience.    3. Neurocognitive deficits # Hallucinations Functional Status   IADL: Independent in the following: managing finances, medications, driving, cooking (only occasionally)           Requires assistance with the following:  ADL  Independent in the following: bathing and hygiene, feeding, continence, grooming and toileting, walking  Requires assistance with the following: Folate, Vitamin B12 (wnl 11/2022), TSH wnl 04/2022 Images Brain: Chronic white matter ischemic change. No evidence of acute infarction, hemorrhage, hydrocephalus, extra-axial collection or mass lesion/mass effect. Neuropsych assessment:  SLUMS 27 on 11/2021, MOCA 22/30 09/2022 Etiology: VaD    No signifciant change. IADL is independent, although she does have some validity deficits as evidenced by MoCA. She has a history of chronic hallucinations, likely secondary to NPS, and she denies significant concern about this.  Will not start antipsychotics at this time given its risk outweighs benefit.   Plan  Continue Paxil  20 mg daily Continue bupropion  450 mg daily   Next appointment: 9/11 at 3 pm, IP   The patient demonstrates the following risk factors for suicide: Chronic risk factors for suicide include: psychiatric disorder of depression . Acute risk factors for suicide include: unemployment and loss (financial, interpersonal, professional). Protective factors for this patient include: coping skills and hope for the future. Considering these factors, the overall suicide risk at this point appears to be low. Patient is appropriate for outpatient follow up.   Collaboration of Care: Collaboration of Care: {BH OP Collaboration of Care:21014065}  Patient/Guardian was advised Release of Information must be obtained prior to any record release in order to collaborate their care with an outside provider. Patient/Guardian was advised if they have not already done so to contact the registration department to sign all necessary forms in order for us  to release information regarding their care.   Consent: Patient/Guardian gives verbal consent for treatment and assignment of benefits for services provided during this visit. Patient/Guardian expressed understanding and agreed to proceed.    Katheren Sleet, MD 09/24/2023, 4:40 PM

## 2023-09-28 ENCOUNTER — Ambulatory Visit: Payer: Medicare (Managed Care) | Admitting: Psychiatry

## 2023-09-28 ENCOUNTER — Encounter: Payer: Self-pay | Admitting: Psychiatry

## 2023-09-28 VITALS — BP 120/76 | HR 103 | Temp 97.9°F | Ht 60.0 in | Wt 133.6 lb

## 2023-09-28 DIAGNOSIS — F411 Generalized anxiety disorder: Secondary | ICD-10-CM

## 2023-09-28 DIAGNOSIS — F3341 Major depressive disorder, recurrent, in partial remission: Secondary | ICD-10-CM

## 2023-09-28 MED ORDER — PAROXETINE HCL 20 MG PO TABS: 20.0000 mg | ORAL_TABLET | Freq: Every day | ORAL | 0 refills | Status: DC

## 2023-09-28 MED ORDER — BUPROPION HCL ER (XL) 150 MG PO TB24: 450.0000 mg | ORAL_TABLET | Freq: Every day | ORAL | 0 refills | Status: DC

## 2023-09-28 NOTE — Patient Instructions (Signed)
 Continue Paxil  20 mg daily Continue bupropion  450 mg daily   Next appointment: 12/2 at 4 pm

## 2023-10-10 ENCOUNTER — Encounter (INDEPENDENT_AMBULATORY_CARE_PROVIDER_SITE_OTHER): Payer: Self-pay | Admitting: Vascular Surgery

## 2023-10-10 NOTE — Progress Notes (Signed)
 Subjective:    Patient ID: Abigail Walker, female    DOB: [birth year], 78 y.o.   MRN: [id number] Chief Complaint  Patient presents with   Carotid    1 year follow up    Abigail Walker is a 78 y.o. (03/08/1945) female who presents with chief complaint of check carotid arteries.   History of Present Illness:    The patient is seen for follow up evaluation of carotid stenosis. The carotid stenosis followed by ultrasound.    The patient denies amaurosis fugax. There is no recent history of TIA symptoms or focal motor deficits. There is no prior documented CVA.   The patient is taking enteric-coated aspirin 81 mg daily.   There is no history of migraine headaches. There is no history of seizures.   No recent shortening of the patient's walking distance or new symptoms consistent with claudication.  No history of rest pain symptoms. No new ulcers or wounds of the lower extremities have occurred.   There is no history of DVT, PE or superficial thrombophlebitis. No recent episodes of angina or shortness of breath documented.    Carotid Duplex done today (09/21/2023) shows 1 to 39% stenosis bilaterally.  No change compared to last study in 09/22/2022     Review of Systems  Constitutional: Negative.   All other systems reviewed and are negative.      Objective:   Physical Exam Vitals reviewed.  Constitutional:      Appearance: Normal appearance. She is normal weight.  HENT:     Head: Normocephalic.  Eyes:     Pupils: Pupils are equal, round, and reactive to light.  Cardiovascular:     Rate and Rhythm: Normal rate and regular rhythm.     Pulses: Normal pulses.     Heart sounds: Normal heart sounds.  Pulmonary:     Effort: Pulmonary effort is normal.     Breath sounds: Normal breath sounds.  Abdominal:     General: Abdomen is flat. Bowel sounds are normal.     Palpations: Abdomen is soft.  Musculoskeletal:        General: Normal range of motion.  Skin:    General: Skin is  warm and dry.     Capillary Refill: Capillary refill takes 2 to 3 seconds.  Neurological:     General: No focal deficit present.     Mental Status: She is alert and oriented to person, place, and time. Mental status is at baseline.  Psychiatric:        Mood and Affect: Mood normal.        Behavior: Behavior normal.        Thought Content: Thought content normal.        Judgment: Judgment normal.     BP (!) 134/59   Pulse 76   Ht 5' (1.524 m)   Wt 133 lb 9.6 oz (60.6 kg)   BMI 26.09 kg/m   Past Medical History:  Diagnosis Date   Adult hypothyroidism 06/16/2013   Anemia    Aortic atherosclerosis    Arthritis    Benign hypertension 06/16/2013   Carotid artery disease    a.) carotid US  02/10/2020: 1-39% BICA; b.) carotid US  09/22/2021: 1-39% BICA   Carlin Abrahams syndrome    CKD (chronic kidney disease), stage III (HCC)    Coronary artery calcification seen on CT scan    Depression    Esophageal spasm    Esophageal stricture    GERD (gastroesophageal reflux  disease)    HBV (hepatitis B virus) infection    High blood pressure    HLD (hyperlipidemia)    Hypothyroidism    LBBB (left bundle branch block)    Lichen planus    Long term current use of antithrombotics/antiplatelets    a.) clopidogrel    Mixed incontinence    OSA on CPAP 06/16/2013   Pneumonia 2023   RLS (restless legs syndrome)    Secondary hyperparathyroidism of renal origin    Skin cancer, basal cell    Sleep apnea    Stroke (HCC)    Thyroid  disease    Type 2 diabetes mellitus treated with insulin  (HCC)     Social History   Socioeconomic History   Marital status: Divorced    Spouse name: Not on file   Number of children: Not on file   Years of education: Not on file   Highest education level: Some college, no degree  Occupational History   Not on file  Tobacco Use   Smoking status: Former    Current packs/day: 0.00    Types: Cigarettes    Quit date: 07/27/1993    Years since quitting: 30.2    Smokeless tobacco: Never   Tobacco comments:    quit 20 years  Vaping Use   Vaping status: Never Used  Substance and Sexual Activity   Alcohol use: No    Alcohol/week: 0.0 standard drinks of alcohol   Drug use: No   Sexual activity: Not Currently    Birth control/protection: Post-menopausal  Other Topics Concern   Not on file  Social History Narrative   Lives alone   Social Drivers of Health   Financial Resource Strain: Low Risk  (06/29/2023)   Received from [facility]   Overall Financial Resource Strain (CARDIA)    Difficulty of Paying Living Expenses: Not very hard  Food Insecurity: No Food Insecurity (06/29/2023)   Received from [facility]   Hunger Vital Sign    Within the past 12 months, you worried that your food would run out before you got the money to buy more.: Never true    Within the past 12 months, the food you bought just didn't last and you didn't have money to get more.: Never true  Transportation Needs: No Transportation Needs (06/29/2023)   Received from [facility] - Transportation    In the past 12 months, has lack of transportation kept you from medical appointments or from getting medications?: No    Lack of Transportation (Non-Medical): No  Physical Activity: Inactive (12/01/2021)   Received from [facility]   Exercise Vital Sign    On average, how many days per week do you engage in moderate to strenuous exercise (like a brisk walk)?: 0 days    On average, how many minutes do you engage in exercise at this level?: 0 min  Stress: Not on file  Social Connections: Socially Isolated (12/01/2021)   Received from [facility]   Social Connection and Isolation Panel    In a typical week, how many times do you talk on the phone with family, friends, or neighbors?: Never    How often do you get together with friends or relatives?: Never    How often do you  attend church or religious services?: Never    Do you belong to any clubs or organizations such as church groups, unions, fraternal or athletic groups, or school groups?:  No    How often do you attend meetings of the clubs or organizations you belong to?: Never    Are you married, widowed, divorced, separated, never married, or living with a partner?: Divorced  Intimate Partner Violence: Not on file    Past Surgical History:  Procedure Laterality Date   CATARACT EXTRACTION Bilateral    COLONOSCOPY  2009   COLONOSCOPY WITH PROPOFOL  N/A 11/19/2021   Procedure: COLONOSCOPY WITH PROPOFOL ;  Surgeon: Maryruth Ole DASEN, MD;  Location: [facility] ENDOSCOPY;  Service: Endoscopy;  Laterality: N/A;  DM   DILATATION & CURETTAGE/HYSTEROSCOPY WITH MYOSURE  10/14/2021   Procedure: MYOSURE POLYPECTOMY;  Surgeon: Verdon Keen, MD;  Location: [facility] ORS;  Service: Gynecology;;   ESOPHAGOGASTRODUODENOSCOPY     2009, 2011   ESOPHAGOGASTRODUODENOSCOPY (EGD) WITH PROPOFOL  N/A 06/08/2015   Procedure: ESOPHAGOGASTRODUODENOSCOPY (EGD) WITH PROPOFOL ;  Surgeon: Deward CINDERELLA Piedmont, MD;  Location: [facility] ENDOSCOPY;  Service: Gastroenterology;  Laterality: N/A;   ESOPHAGOGASTRODUODENOSCOPY (EGD) WITH PROPOFOL  N/A 11/19/2021   Procedure: ESOPHAGOGASTRODUODENOSCOPY (EGD) WITH PROPOFOL ;  Surgeon: Maryruth Ole DASEN, MD;  Location: [facility] ENDOSCOPY;  Service: Endoscopy;  Laterality: N/A;   HEMORRHOID SURGERY     HYSTEROSCOPY WITH D & C N/A 10/14/2021   Procedure: DILATATION AND CURETTAGE /HYSTEROSCOPY;  Surgeon: Verdon Keen, MD;  Location: [facility] ORS;  Service: Gynecology;  Laterality: N/A;   IMPLANTABLE CONTACT LENS IMPLANTATION     LYSIS OF ADHESION  10/14/2021   Procedure: LYSIS OF VAGINAL ADHESIONS;  Surgeon: Verdon Keen, MD;  Location: [facility] ORS;  Service: Gynecology;;   TONSILLECTOMY     TUBAL LIGATION      Family History  Problem Relation Age of Onset   Alcohol abuse Mother    Cancer Mother    Heart disease Father     Kidney failure Maternal Aunt     Allergies  Allergen Reactions   Amoxicillin -Pot Clavulanate Rash   Metformin And Related Nausea And Vomiting   Sulfa Antibiotics Nausea And Vomiting   Hydrocortisone-Iodoquinol  [Hydrocortisone-Iodoquinol] Rash    Patient reports that she does not believe that she has allergy to this, that she was receiving many medications at once.       Latest Ref Rng & Units 10/27/2021    3:26 PM 10/14/2021    7:42 PM 10/14/2021   10:44 AM  CBC  WBC 4.0 - 10.5 K/uL 2.7  2.9  2.9   Hemoglobin 12.0 - 15.0 g/dL 87.2  87.6  87.6   Hematocrit 36.0 - 46.0 % 38.6  37.9  38.2   Platelets 150 - 400 K/uL 153  105  124       CMP     Component Value Date/Time   NA 139 10/27/2021 1526   NA 140 05/19/2011 0409   K 4.4 10/27/2021 1526   K 4.2 05/19/2011 0409   CL 105 10/27/2021 1526   CL 105 05/19/2011 0409   CO2 29 10/27/2021 1526   CO2 28 05/19/2011 0409   GLUCOSE 151 (H) 10/27/2021 1526   GLUCOSE 158 (H) 05/19/2011 0409   BUN 22 10/27/2021 1526   BUN 15 05/19/2011 0409   CREATININE 1.28 (H) 10/27/2021 1526   CREATININE 1.18 05/19/2011 0409   CALCIUM  9.3 10/27/2021 1526   CALCIUM  9.2 05/19/2011 0409   PROT 6.3 (L) 08/10/2021 1001   PROT 7.1 05/14/2011 1432   ALBUMIN 3.7 08/10/2021 1001   ALBUMIN 3.2 (L) 05/14/2011 1432   AST 28 08/10/2021 1001   AST 20 05/14/2011 1432   ALT 18 08/10/2021  1001   ALT 20 05/14/2011 1432   ALKPHOS 73 08/10/2021 1001   ALKPHOS 88 05/14/2011 1432   BILITOT 0.9 08/10/2021 1001   BILITOT 0.3 05/14/2011 1432   GFRNONAA 43 (L) 10/27/2021 1526   GFRNONAA 48 (L) 05/19/2011 0409     No results found.     Assessment & Plan:   1. Bilateral carotid artery stenosis (Primary) Recommend:   Given the patient's asymptomatic subcritical stenosis no further invasive testing or surgery at this time.   Duplex ultrasound shows 1-39% stenosis bilaterally.   Continue antiplatelet therapy as prescribed Continue management of CAD, HTN  and Hyperlipidemia Healthy heart diet,  encouraged exercise at least 4 times per week Follow up in 12 months with duplex ultrasound and physical exam   - VAS US  CAROTID; Future  2. PAD (peripheral artery disease) Recommend:   I do not find evidence of life style limiting vascular disease. The patient specifically denies life style limitation.   Previous noninvasive studies including ABI's of the legs do not identify critical vascular problems.   The patient should continue walking and begin a more formal exercise program. The patient should continue his antiplatelet therapy and aggressive treatment of the lipid abnormalities.   The patient is instructed to call the office if there is a significant change in the lower extremity symptoms, particularly if a wound develops or there is an abrupt increase in leg pain.  3. Benign hypertension Continue antihypertensive medications as already ordered, these medications have been reviewed and there are no changes at this time.   4. Type 2 diabetes mellitus with diabetic peripheral angiopathy without gangrene, unspecified whether long term insulin  use (HCC) Continue hypoglycemic medications as already ordered, these medications have been reviewed and there are no changes at this time.   Hgb A1C to be monitored as already arranged by primary service  5. Mixed hyperlipidemia Continue statin as ordered and reviewed, no changes at this time   6. Stage 3 chronic kidney disease, unspecified whether stage 3a or 3b CKD (HCC) The patient has advanced renal disease.  However, at the present time the patient is not yet on dialysis.   Avoid nephrotoxic medications and dehydration.  Further plans per nephrology   Current Outpatient Medications on File Prior to Visit  Medication Sig Dispense Refill   acetaminophen  (TYLENOL ) 500 MG tablet Take 500 mg by mouth every 6 (six) hours as needed for moderate pain.     Cholecalciferol  (VITAMIN D3) 50 MCG (2000  UT) TABS Take 2 tablets by mouth daily.     clobetasol  ointment (TEMOVATE ) 0.05 % APPLY ONE APPLICATION TOPICALLY DAILY. VAGINA 60 g 0   clopidogrel  (PLAVIX ) 75 MG tablet Take 1 tablet (75 mg total) by mouth at bedtime. 30 tablet 0   cyanocobalamin (VITAMIN B12) 1000 MCG tablet Take 1,000 mcg by mouth daily.     docusate sodium  (COLACE) 100 MG capsule Take 1 capsule (100 mg total) by mouth daily. 30 capsule 0   ferrous sulfate  325 (65 FE) MG tablet Take 1 tablet (325 mg total) by mouth daily with breakfast. (Patient taking differently: Take 325 mg by mouth daily.) 30 tablet 0   insulin  glargine (LANTUS ) 100 UNIT/ML injection Inject 0.15 mLs (15 Units total) into the skin at bedtime. (Patient taking differently: Inject 15 Units into the skin daily. morning) 10 mL 0   Insulin  Syringe-Needle U-100 (INSULIN  SYRINGE 1CC/31GX5/16) 31G X 5/16 1 ML MISC USE ONE SYRINGE TWICE DAILY AS DIRECTED WITH INSULIN  VIALS  levothyroxine  (SYNTHROID ) 75 MCG tablet Take 1 tablet (75 mcg total) by mouth daily before breakfast. (Patient taking differently: Take 20 mcg by mouth daily before breakfast.) 30 tablet 0   lidocaine -prilocaine  (EMLA ) cream Apply 1 Application topically as needed. To your vulva for pain control 30 g 0   lisinopril  (ZESTRIL ) 20 MG tablet Take 10 mg by mouth daily.     lisinopril  (ZESTRIL ) 5 MG tablet Take 5 mg by mouth every morning.     NOVOLOG  100 UNIT/ML injection Inject 6 Units into the skin in the morning and at bedtime. (Patient taking differently: Inject 4-6 Units into the skin in the morning and at bedtime. 4 units in the morning and 6 units at bedtime) 10 mL 0   nystatin cream (MYCOSTATIN) Apply 1 application topically as needed (yeast).     rosuvastatin  (CRESTOR ) 40 MG tablet Take 1 tablet (40 mg total) by mouth at bedtime. 90 tablet 0   torsemide  (DEMADEX ) 10 MG tablet Take 1 tablet (10 mg total) by mouth every other day. 30 tablet 0   No current facility-administered medications on  file prior to visit.    There are no Patient Instructions on file for this visit. No follow-ups on file.   Abigail JONELLE Shank, NP

## 2023-12-16 NOTE — Progress Notes (Signed)
 [facility] MD/PA/NP OP Progress Note  12/19/2023 4:23 PM Abigail Walker  MRN:  [id number]  Chief Complaint:  Chief Complaint  Patient presents with   Follow-up   HPI:  This is a follow-up appointment for depression, anxiety.  She states that she has been doing good.  She reports concerned about her heart murmur.  She feels reassured after speaking with her cardiologist.  She speaks about her friend, who invited her for dinner.  She is concerned about her friend's condition.  Although her anxiety comes and goes, she denies dwelling on this.  She also states that her depression is on and off.  She tends to feel bothered when something on the news.  However, she is able to refocus by changing the subject as she cannot do anything about this.  Her appetite is good.  He feels good about recent blood sugar control.  She enjoys being with her cat.  She sleeps 7 hours with occasional nocturia.  She denies SI, HI, hallucinations.  She denies any falls or dizziness.  She agrees with the plans as outlined below.   Wt Readings from Last 3 Encounters:  12/19/23 132 lb 6.4 oz (60.1 kg)  09/28/23 133 lb 9.6 oz (60.6 kg)  09/21/23 133 lb 9.6 oz (60.6 kg)      Substance use   Tobacco Alcohol Other substances/  Current denies denies denies  Past Smoked 20 years in the past, last in 1990's Denies (hated alcohol due to her mother's history of alcohol use) denies  Past Treatment            Support: Household: by herself Marital status: divorced after ten years of marriage,  until 1979 Number of children: 1 daughter (deceased from cancer. She used to have issues with drug).  She was unable to see her grandchildren for 10 years since the loss of her daughter, although she reconnected recently Employment: retired, public librarian in the past Education: High school. Received six months of nurse's training at age 29 She states that her parents did not pay attention to her.  She never had hugs, kisses except from  her maternal grandmother.  Her mother abused alcohol, and she raised myself.  Visit Diagnosis:    ICD-10-CM   1. MDD (major depressive disorder), recurrent, in partial remission  F33.41     2. Anxiety state  F41.1     3. Neurocognitive deficits  R29.818    R41.89       Past Psychiatric History: Please see initial evaluation for full details. I have reviewed the history. No updates at this time.     Past Medical History:  Past Medical History:  Diagnosis Date   Adult hypothyroidism 06/16/2013   Anemia    Aortic atherosclerosis    Arthritis    Benign hypertension 06/16/2013   Carotid artery disease    a.) carotid US  02/10/2020: 1-39% BICA; b.) carotid US  09/22/2021: 1-39% BICA   Carlin Abrahams syndrome    CKD (chronic kidney disease), stage III (HCC)    Coronary artery calcification seen on CT scan    Depression    Esophageal spasm    Esophageal stricture    GERD (gastroesophageal reflux disease)    HBV (hepatitis B virus) infection    High blood pressure    HLD (hyperlipidemia)    Hypothyroidism    LBBB (left bundle branch block)    Lichen planus    Long term current use of antithrombotics/antiplatelets    a.) clopidogrel   Mixed incontinence    OSA on CPAP 06/16/2013   Pneumonia 2023   RLS (restless legs syndrome)    Secondary hyperparathyroidism of renal origin    Skin cancer, basal cell    Sleep apnea    Stroke (HCC)    Thyroid  disease    Type 2 diabetes mellitus treated with insulin  [facility])     Past Surgical History:  Procedure Laterality Date   CATARACT EXTRACTION Bilateral    COLONOSCOPY  2009   COLONOSCOPY WITH PROPOFOL  N/A 11/19/2021   Procedure: COLONOSCOPY WITH PROPOFOL ;  Surgeon: Maryruth Ole DASEN, MD;  Location: [facility] ENDOSCOPY;  Service: Endoscopy;  Laterality: N/A;  DM   DILATATION & CURETTAGE/HYSTEROSCOPY WITH MYOSURE  10/14/2021   Procedure: MYOSURE POLYPECTOMY;  Surgeon: Verdon Keen, MD;  Location: [facility] ORS;  Service: Gynecology;;    ESOPHAGOGASTRODUODENOSCOPY     2009, 2011   ESOPHAGOGASTRODUODENOSCOPY (EGD) WITH PROPOFOL  N/A 06/08/2015   Procedure: ESOPHAGOGASTRODUODENOSCOPY (EGD) WITH PROPOFOL ;  Surgeon: Deward CINDERELLA Piedmont, MD;  Location: [facility] ENDOSCOPY;  Service: Gastroenterology;  Laterality: N/A;   ESOPHAGOGASTRODUODENOSCOPY (EGD) WITH PROPOFOL  N/A 11/19/2021   Procedure: ESOPHAGOGASTRODUODENOSCOPY (EGD) WITH PROPOFOL ;  Surgeon: Maryruth Ole DASEN, MD;  Location: [facility] ENDOSCOPY;  Service: Endoscopy;  Laterality: N/A;   HEMORRHOID SURGERY     HYSTEROSCOPY WITH D & C N/A 10/14/2021   Procedure: DILATATION AND CURETTAGE /HYSTEROSCOPY;  Surgeon: Verdon Keen, MD;  Location: [facility] ORS;  Service: Gynecology;  Laterality: N/A;   IMPLANTABLE CONTACT LENS IMPLANTATION     LYSIS OF ADHESION  10/14/2021   Procedure: LYSIS OF VAGINAL ADHESIONS;  Surgeon: Verdon Keen, MD;  Location: [facility] ORS;  Service: Gynecology;;   TONSILLECTOMY     TUBAL LIGATION      Family Psychiatric History: Please see initial evaluation for full details. I have reviewed the history. No updates at this time.     Family History:  Family History  Problem Relation Age of Onset   Alcohol abuse Mother    Cancer Mother    Heart disease Father    Kidney failure Maternal Aunt     Social History:  Social History   Socioeconomic History   Marital status: Divorced    Spouse name: Not on file   Number of children: Not on file   Years of education: Not on file   Highest education level: Some college, no degree  Occupational History   Not on file  Tobacco Use   Smoking status: Former    Current packs/day: 0.00    Types: Cigarettes    Quit date: 07/27/1993    Years since quitting: 30.4   Smokeless tobacco: Never   Tobacco comments:    quit 20 years  Vaping Use   Vaping status: Never Used  Substance and Sexual Activity   Alcohol use: No    Alcohol/week: 0.0 standard drinks of alcohol   Drug use: No   Sexual activity: Not Currently    Birth  control/protection: Post-menopausal  Other Topics Concern   Not on file  Social History Narrative   Lives alone   Social Drivers of Health   Financial Resource Strain: Low Risk  (06/29/2023)   Received from [facility]   Overall Financial Resource Strain (CARDIA)    Difficulty of Paying Living Expenses: Not very hard  Food Insecurity: No Food Insecurity (06/29/2023)   Received from [facility]   Hunger Vital Sign    Within the past 12 months, you worried that your food would run out  before you got the money to buy more.: Never true    Within the past 12 months, the food you bought just didn't last and you didn't have money to get more.: Never true  Transportation Needs: No Transportation Needs (06/29/2023)   Received from [facility] - Transportation    In the past 12 months, has lack of transportation kept you from medical appointments or from getting medications?: No    Lack of Transportation (Non-Medical): No  Physical Activity: Inactive (12/01/2021)   Received from [facility]   Exercise Vital Sign    On average, how many days per week do you engage in moderate to strenuous exercise (like a brisk walk)?: 0 days    On average, how many minutes do you engage in exercise at this level?: 0 min  Stress: Not on file  Social Connections: Socially Isolated (12/01/2021)   Received from [facility]   Social Connection and Isolation Panel    In a typical week, how many times do you talk on the phone with family, friends, or neighbors?: Never    How often do you get together with friends or relatives?: Never    How often do you attend church or religious services?: Never    Do you belong to any clubs or organizations such as church groups, unions, fraternal or athletic groups, or school groups?: No    How often do you attend meetings of the clubs or organizations you belong to?: Never    Are  you married, widowed, divorced, separated, never married, or living with a partner?: Divorced    Allergies:  Allergies  Allergen Reactions   Amoxicillin -Pot Clavulanate Rash   Metformin And Related Nausea And Vomiting   Sulfa Antibiotics Nausea And Vomiting   Hydrocortisone-Iodoquinol  [Hydrocortisone-Iodoquinol] Rash    Patient reports that she does not believe that she has allergy to this, that she was receiving many medications at once.    Metabolic Disorder Labs: Lab Results  Component Value Date   HGBA1C 6.6 (H) 10/27/2021   MPG 142.72 10/27/2021   MPG 145.59 08/11/2021   No results found for: PROLACTIN No results found for: CHOL, TRIG, HDL, CHOLHDL, VLDL, LDLCALC Lab Results  Component Value Date   TSH 0.061 (L) 10/27/2021   TSH 11.315 (H) 01/09/2020    Therapeutic Level Labs: No results found for: LITHIUM No results found for: VALPROATE No results found for: CBMZ  Current Medications: Current Outpatient Medications  Medication Sig Dispense Refill   acetaminophen  (TYLENOL ) 500 MG tablet Take 500 mg by mouth every 6 (six) hours as needed for moderate pain.     [START ON 01/01/2024] buPROPion  (WELLBUTRIN  XL) 150 MG 24 hr tablet Take 3 tablets (450 mg total) by mouth daily. 270 tablet 0   Cholecalciferol  (VITAMIN D3) 50 MCG (2000 UT) TABS Take 2 tablets by mouth daily.     clobetasol  ointment (TEMOVATE ) 0.05 % APPLY ONE APPLICATION TOPICALLY DAILY. VAGINA 60 g 0   clopidogrel  (PLAVIX ) 75 MG tablet Take 1 tablet (75 mg total) by mouth at bedtime. 30 tablet 0   cyanocobalamin (VITAMIN B12) 1000 MCG tablet Take 1,000 mcg by mouth daily.     docusate sodium  (COLACE) 100 MG capsule Take 1 capsule (100 mg total) by mouth daily. 30 capsule 0   ferrous sulfate  325 (65 FE) MG tablet Take 1 tablet (325 mg total) by mouth daily with breakfast. (Patient taking differently: Take 325 mg by mouth  daily.) 30 tablet 0   insulin  glargine (LANTUS ) 100 UNIT/ML injection  Inject 0.15 mLs (15 Units total) into the skin at bedtime. (Patient taking differently: Inject 15 Units into the skin daily. morning) 10 mL 0   Insulin  Syringe-Needle U-100 (INSULIN  SYRINGE 1CC/31GX5/16) 31G X 5/16 1 ML MISC USE ONE SYRINGE TWICE DAILY AS DIRECTED WITH INSULIN  VIALS     levothyroxine  (SYNTHROID ) 75 MCG tablet Take 1 tablet (75 mcg total) by mouth daily before breakfast. (Patient taking differently: Take 20 mcg by mouth daily before breakfast.) 30 tablet 0   lidocaine -prilocaine  (EMLA ) cream Apply 1 Application topically as needed. To your vulva for pain control 30 g 0   lisinopril  (ZESTRIL ) 20 MG tablet Take 10 mg by mouth daily.     lisinopril  (ZESTRIL ) 5 MG tablet Take 5 mg by mouth every morning.     NOVOLOG  100 UNIT/ML injection Inject 6 Units into the skin in the morning and at bedtime. (Patient taking differently: Inject 4-6 Units into the skin in the morning and at bedtime. 4 units in the morning and 6 units at bedtime) 10 mL 0   nystatin cream (MYCOSTATIN) Apply 1 application topically as needed (yeast).     [START ON 01/01/2024] PARoxetine  (PAXIL ) 20 MG tablet Take 1 tablet (20 mg total) by mouth daily. 90 tablet 0   rosuvastatin  (CRESTOR ) 40 MG tablet Take 1 tablet (40 mg total) by mouth at bedtime. 90 tablet 0   torsemide  (DEMADEX ) 10 MG tablet Take 1 tablet (10 mg total) by mouth every other day. 30 tablet 0   No current facility-administered medications for this visit.     Musculoskeletal: Strength & Muscle Tone: within normal limits Gait & Station: normal Patient leans: N/A  Psychiatric Specialty Exam: Review of Systems  Psychiatric/Behavioral:  Negative for agitation, behavioral problems, confusion and decreased concentration.   All other systems reviewed and are negative.   Blood pressure 127/69, pulse 88, temperature 97.8 F (36.6 C), temperature source Temporal, height 5' (1.524 m), weight 132 lb 6.4 oz (60.1 kg).Body mass index is 25.86 kg/m.   General Appearance: Well Groomed  Eye Contact:  Good  Speech:  Clear and Coherent  Volume:  Normal  Mood:  good  Affect:  Appropriate, Congruent, and calm  Thought Process:  Coherent  Orientation:  Full (Time, Place, and Person)  Thought Content: Logical   Suicidal Thoughts:  No  Homicidal Thoughts:  No  Memory:  Immediate;   Good  Judgement:  Good  Insight:  Good  Psychomotor Activity:  Normal  Concentration:  Concentration: Good and Attention Span: Good  Recall:  Good  Fund of Knowledge: Good  Language: Good  Akathisia:  No  Handed:  Right  AIMS (if indicated): not done  Assets:  Communication Skills Desire for Improvement  ADL's:  Intact  Cognition: WNL  Sleep:  Fair   Screenings: GAD-7    Garment/textile Technologist Visit from [facility]/13/2023 in [facility]  Total GAD-7 Score 4   PHQ2-9    Flowsheet Row Office Visit from 06/13/2023 in [facility] Office Visit from 06/06/2022 in [facility]  PHQ-2 Total Score 1 1  PHQ-9 Total Score -- 5   Flowsheet Row ED from 10/30/2022 in [facility] at [facility] Admission (Discharged) from 10/14/2021 in [facility] from 08/10/2021 in [facility] Department at [facility]  C-SSRS RISK CATEGORY No  Risk No Risk No Risk     Assessment and Plan:  Abigail Walker is a 78 y.o. female with a history of depression, endometrial adenocarcinoma s/p hysterectomy and bilateral SPO, not on chemotherapy or radiation secondary to lichen planus, right occipital infarcts in 2021, cognitive improvements, type II diabetes, hypertension, hyperlipidemia, LBBB, who presents for follow up appointment for below.  1. MDD (major depressive disorder), recurrent, in partial remission 2. Anxiety state Acute stressors include: financial strain in the setting of buying a car,  loss of her close friend  Other stressors include: absence of nurturing except her maternal grandfather (her mother had alcohol use issues) History: depression since child, originally on Paxil  20 mg daily, bupropion  450 mg daily      Although she reports experiencing occasional down mood and anxiety, these are self-limited and she denies any concern otherwise.  Will continue current medication regimen.  Will continue Paxil  to target depression and anxiety.  Will continue bupropion  as an adjunctive treatment for depression.  Provided psychoeducation regarding behavioral activation.  Noted that although she was previously recommended for CBT, she is not interested in this due to the past experience.    3. Neurocognitive deficits # Hallucinations Functional Status   IADL: Independent in the following: managing finances, medications, driving, cooking (only occasionally)           Requires assistance with the following:  ADL  Independent in the following: bathing and hygiene, feeding, continence, grooming and toileting, walking          Requires assistance with the following: Folate, Vitamin B12 (wnl 11/2022), TSH wnl 04/2022 Images Brain: Chronic white matter ischemic change. No evidence of acute infarction, hemorrhage, hydrocephalus, extra-axial collection or mass lesion/mass effect. Neuropsych assessment: SLUMS 27 on 11/2021, MOCA 22/30 09/2022 Etiology: VaD    No change. IADL is independent, although she does have some validity deficits as evidenced by MoCA. She has a history of chronic hallucinations, likely secondary to NPS.  She denies any hallucinations on today's visit.  Will continue to assess.    Plan  Continue Paxil  20 mg daily Continue bupropion  450 mg daily   Next appointment: 2/26 at 4pm, IP   The patient demonstrates the following risk factors for suicide: Chronic risk factors for suicide include: psychiatric disorder of depression . Acute risk factors for suicide include:  unemployment and loss (financial, interpersonal, professional). Protective factors for this patient include: coping skills and hope for the future. Considering these factors, the overall suicide risk at this point appears to be low. Patient is appropriate for outpatient follow up.   A total of 30 minutes was spent on the following activities during the encounter date, which includes but is not limited to: preparing to see the patient (e.g., reviewing tests and records), obtaining and/or reviewing separately obtained history, performing a medically necessary examination or evaluation, counseling and educating the patient, family, or caregiver, ordering medications, tests, or procedures, referring and communicating with other healthcare professionals (when not reported separately), documenting clinical information in the electronic or paper health record, independently interpreting test or lab results and communicating these results to the family or caregiver, and coordinating care (when not reported separately).   Collaboration of Care: Collaboration of Care: Other reviewed notes in Epic  Patient/Guardian was advised Release of Information must be obtained prior to any record release in order to collaborate their care with an outside provider. Patient/Guardian was advised if they have not already done so to contact the registration department to  sign all necessary forms in order for us  to release information regarding their care.   Consent: Patient/Guardian gives verbal consent for treatment and assignment of benefits for services provided during this visit. Patient/Guardian expressed understanding and agreed to proceed.    Katheren Sleet, MD 12/19/2023, 4:23 PM

## 2023-12-19 ENCOUNTER — Ambulatory Visit (INDEPENDENT_AMBULATORY_CARE_PROVIDER_SITE_OTHER): Payer: Medicare (Managed Care) | Admitting: Psychiatry

## 2023-12-19 ENCOUNTER — Encounter: Payer: Self-pay | Admitting: Psychiatry

## 2023-12-19 ENCOUNTER — Other Ambulatory Visit: Payer: Self-pay

## 2023-12-19 VITALS — BP 127/69 | HR 88 | Temp 97.8°F | Ht 60.0 in | Wt 132.4 lb

## 2023-12-19 DIAGNOSIS — F411 Generalized anxiety disorder: Secondary | ICD-10-CM

## 2023-12-19 DIAGNOSIS — F3341 Major depressive disorder, recurrent, in partial remission: Secondary | ICD-10-CM | POA: Diagnosis not present

## 2023-12-19 DIAGNOSIS — R29818 Other symptoms and signs involving the nervous system: Secondary | ICD-10-CM | POA: Diagnosis not present

## 2023-12-19 DIAGNOSIS — R4189 Other symptoms and signs involving cognitive functions and awareness: Secondary | ICD-10-CM | POA: Diagnosis not present

## 2023-12-19 MED ORDER — PAROXETINE HCL 20 MG PO TABS: 20.0000 mg | ORAL_TABLET | Freq: Every day | ORAL | 0 refills | Status: AC

## 2023-12-19 MED ORDER — BUPROPION HCL ER (XL) 150 MG PO TB24: 450.0000 mg | ORAL_TABLET | Freq: Every day | ORAL | 0 refills | Status: AC

## 2023-12-19 NOTE — Patient Instructions (Signed)
 Continue Paxil  20 mg daily Continue bupropion  450 mg daily   Next appointment: 2/26 at 4pm

## 2024-03-14 ENCOUNTER — Ambulatory Visit: Payer: Medicare (Managed Care) | Admitting: Psychiatry

## 2024-09-20 ENCOUNTER — Ambulatory Visit (INDEPENDENT_AMBULATORY_CARE_PROVIDER_SITE_OTHER): Payer: Medicare (Managed Care) | Admitting: Nurse Practitioner

## 2024-09-20 ENCOUNTER — Encounter (INDEPENDENT_AMBULATORY_CARE_PROVIDER_SITE_OTHER): Payer: Medicare (Managed Care)
